# Patient Record
Sex: Male | Born: 1950 | Race: White | Hispanic: No | Marital: Married | Smoking: Former smoker
Health system: Southern US, Community
[De-identification: ages and names within clinical notes are randomized; demographics above are authoritative.]

## PROBLEM LIST (undated history)

## (undated) DIAGNOSIS — I4891 Unspecified atrial fibrillation: Secondary | ICD-10-CM

## (undated) DIAGNOSIS — K900 Celiac disease: Secondary | ICD-10-CM

## (undated) DIAGNOSIS — F419 Anxiety disorder, unspecified: Secondary | ICD-10-CM

## (undated) DIAGNOSIS — H269 Unspecified cataract: Secondary | ICD-10-CM

## (undated) DIAGNOSIS — T7840XA Allergy, unspecified, initial encounter: Secondary | ICD-10-CM

## (undated) DIAGNOSIS — H3320 Serous retinal detachment, unspecified eye: Secondary | ICD-10-CM

## (undated) DIAGNOSIS — I639 Cerebral infarction, unspecified: Secondary | ICD-10-CM

## (undated) DIAGNOSIS — M1990 Unspecified osteoarthritis, unspecified site: Secondary | ICD-10-CM

## (undated) DIAGNOSIS — J449 Chronic obstructive pulmonary disease, unspecified: Secondary | ICD-10-CM

## (undated) HISTORY — DX: Unspecified osteoarthritis, unspecified site: M19.90

## (undated) HISTORY — PX: EYE SURGERY: SHX253

## (undated) HISTORY — DX: Allergy, unspecified, initial encounter: T78.40XA

## (undated) HISTORY — DX: Unspecified cataract: H26.9

## (undated) HISTORY — DX: Chronic obstructive pulmonary disease, unspecified: J44.9

## (undated) HISTORY — DX: Anxiety disorder, unspecified: F41.9

## (undated) HISTORY — PX: FRACTURE SURGERY: SHX138

## (undated) HISTORY — PX: BRAIN SURGERY: SHX531

## (undated) HISTORY — PX: CATARACT EXTRACTION: SUR2

## (undated) HISTORY — DX: Serous retinal detachment, unspecified eye: H33.20

## (undated) HISTORY — PX: RETINAL DETACHMENT SURGERY: SHX105

## (undated) HISTORY — DX: Unspecified atrial fibrillation: I48.91

## (undated) HISTORY — PX: HERNIA REPAIR: SHX51

## (undated) HISTORY — DX: Cerebral infarction, unspecified: I63.9

---

## 2012-05-22 DIAGNOSIS — L0291 Cutaneous abscess, unspecified: Secondary | ICD-10-CM | POA: Insufficient documentation

## 2018-01-30 ENCOUNTER — Other Ambulatory Visit: Payer: Self-pay

## 2018-01-30 ENCOUNTER — Ambulatory Visit (HOSPITAL_COMMUNITY)
Admission: EM | Admit: 2018-01-30 | Discharge: 2018-01-30 | Disposition: A | Discharge status: Home / Self Care | Payer: Medicare Other | Attending: Family Medicine | Admitting: Family Medicine

## 2018-01-30 ENCOUNTER — Encounter (HOSPITAL_COMMUNITY): Payer: Self-pay | Admitting: Emergency Medicine

## 2018-01-30 DIAGNOSIS — K1120 Sialoadenitis, unspecified: Secondary | ICD-10-CM

## 2018-01-30 HISTORY — DX: Celiac disease: K90.0

## 2018-01-30 MED ORDER — AMOXICILLIN-POT CLAVULANATE 875-125 MG PO TABS: 1.0000 | ORAL_TABLET | Freq: Two times a day (BID) | ORAL | 0 refills | Status: AC

## 2018-01-30 NOTE — ED Provider Notes (Signed)
[place]    CSN: [id number] Arrival date & time: 01/30/18  1302     History   Chief Complaint Chief Complaint  Patient presents with  . Lymphadenopathy    left jaw/ear    HPI Douglas Cox is a 67 y.o. male.   Douglas Cox presents with complaints of left facial/jaw/ear tenderness and swelling which he first noticed three days ago and has been worsening. Pain with chewing or opening the jaw. No inner ear pain. No known fevers. No cough, congestion, runny nose. No dental pain. Took ibuprofen which seemed to help some. Moved here recently and has not yet established with a PCP. Hx of celiac, otherwise denies any other medical history.   ROS per HPI.      Past Medical History:  Diagnosis Date  . Celiac disease     There are no active problems to display for this patient.   Past Surgical History:  Procedure Laterality Date  . FRACTURE SURGERY    . HERNIA REPAIR         Home Medications    Prior to Admission medications   Medication Sig Start Date End Date Taking? Authorizing Provider  amoxicillin-clavulanate (AUGMENTIN) 875-125 MG tablet Take 1 tablet by mouth every 12 (twelve) hours for 10 days. 01/30/18 02/09/18  Zigmund Gottron, NP    Family History History reviewed. No pertinent family history.  Social History Social History   Tobacco Use  . Smoking status: Former Smoker    Last attempt to quit: 01/30/2006    Years since quitting: 12.0  . Smokeless tobacco: Never Used  Substance Use Topics  . Alcohol use: Yes  . Drug use: Yes    Types: Marijuana     Allergies   Codone [hydrocodone] and Clarithromycin   Review of Systems Review of Systems   Physical Exam Triage Vital Signs ED Triage Vitals  Enc Vitals Group     BP 01/30/18 1324 (!) 156/88     Pulse Rate 01/30/18 1324 (!) 52     Resp --      Temp 01/30/18 1324 98.3 F (36.8 C)     Temp Source 01/30/18 1324 Oral     SpO2 01/30/18 1324 99 %     Weight --      Height --    Head Circumference --      Peak Flow --      Pain Score 01/30/18 1319 4     Pain Loc --      Pain Edu? --      Excl. in [place]? --    No data found.  Updated Vital Signs BP (!) 156/88 (BP Location: Right Arm)   Pulse (!) 52   Temp 98.3 F (36.8 C) (Oral)   SpO2 99%    Physical Exam  Constitutional: He is oriented to person, place, and time. He appears well-developed and well-nourished.  HENT:  Head: Normocephalic and atraumatic.    Right Ear: Tympanic membrane, external ear and ear canal normal.  Left Ear: Tympanic membrane, external ear and ear canal normal.  Nose: Nose normal. Right sinus exhibits no maxillary sinus tenderness and no frontal sinus tenderness. Left sinus exhibits no maxillary sinus tenderness and no frontal sinus tenderness.  Mouth/Throat: Uvula is midline, oropharynx is clear and moist and mucous membranes are normal. Tonsils are 0 on the right. Tonsils are 0 on the left. No tonsillar exudate.  No oral lesions or swelling; left facial swelling and tenderness anterior to ear  Eyes: Pupils are equal, round, and reactive to light. Conjunctivae are normal.  Neck: Normal range of motion.  Cardiovascular: Normal rate and regular rhythm.  Pulmonary/Chest: Effort normal and breath sounds normal.  Lymphadenopathy:    He has no cervical adenopathy.  Neurological: He is alert and oriented to person, place, and time.  Skin: Skin is warm and dry.  Vitals reviewed.    UC Treatments / Results  Labs (all labs ordered are listed, but only abnormal results are displayed) Labs Reviewed - No data to display  EKG None  Radiology No results found.  Procedures Procedures (including critical care time)  Medications Ordered in UC Medications - No data to display  Initial Impression / Assessment and Plan / UC Course  I have reviewed the triage vital signs and the nursing notes.  Pertinent labs & imaging results that were available during my care of the patient were  reviewed by me and considered in my medical decision making (see chart for details).     Worsening of left facial pain and swelling. Appears consistent with parotitis. Supportive cares recommended, increase fluid intake, suck on sour candies, massage, warm pack. Course of augmentin provided. Return precautions provided. Patient verbalized understanding and agreeable to plan.    Final Clinical Impressions(s) / UC Diagnoses   Final diagnoses:  Parotitis     Discharge Instructions     Warm compresses, massage to the area, increased fluid intake, sucking on sour candy can help with resolution of symptoms.  Complete course of antibiotics.   If symptoms worsen or do not improve in the next week to return to be seen or to follow up with your PCP.      ED Prescriptions    Medication Sig Dispense Auth. Provider   amoxicillin-clavulanate (AUGMENTIN) 875-125 MG tablet Take 1 tablet by mouth every 12 (twelve) hours for 10 days. 20 tablet Zigmund Gottron, NP     Controlled Substance Prescriptions [place] Controlled Substance Registry consulted? Not Applicable   Zigmund Gottron, NP 01/30/18 1342

## 2018-01-30 NOTE — Discharge Instructions (Signed)
Warm compresses, massage to the area, increased fluid intake, sucking on sour candy can help with resolution of symptoms.  Complete course of antibiotics.   If symptoms worsen or do not improve in the next week to return to be seen or to follow up with your PCP.

## 2018-01-30 NOTE — ED Triage Notes (Signed)
Pt reports swelling below his left lower jaw and ear.  He first noticed it on Friday.

## 2018-08-01 ENCOUNTER — Ambulatory Visit: Payer: Medicare Other | Admitting: Endocrinology

## 2018-08-09 ENCOUNTER — Ambulatory Visit: Payer: Medicare Other | Admitting: Endocrinology

## 2018-08-09 ENCOUNTER — Encounter: Payer: Self-pay | Admitting: Endocrinology

## 2018-08-09 VITALS — BP 122/72 | HR 80 | Ht 71.0 in | Wt 170.4 lb

## 2018-08-09 DIAGNOSIS — E039 Hypothyroidism, unspecified: Secondary | ICD-10-CM | POA: Diagnosis not present

## 2018-08-09 DIAGNOSIS — R6882 Decreased libido: Secondary | ICD-10-CM

## 2018-08-09 DIAGNOSIS — K900 Celiac disease: Secondary | ICD-10-CM | POA: Diagnosis not present

## 2018-08-09 DIAGNOSIS — R5383 Other fatigue: Secondary | ICD-10-CM

## 2018-08-09 NOTE — Progress Notes (Addendum)
Patient ID: Douglas Cox, adult   DOB: 1951/01/09, 68 y.o.   MRN: [id number]           Referring Provider: Sharyne Peach  Reason for Appointment: Endocrinology evaluation    History of Present Illness:   Chief complaint: Fatigue   Hypothyroidism was first diagnosed in 7/19 when he was being evaluated as a new patient for fatigue He says that he has been feeling tired and listless for the last few years  He does complain of some cold intolerance also but no weight gain.  He has had some previous history of brain fog Also has had some mood swings He has not had any previous history of thyroid disease or swelling in the thyroid         TSH in 01/2018 was 4.7 with normal total T4 No other thyroid results are available  The patient had been treated with  levothyroxine 50 mcg daily but he took this only for 2 days as he did not feel good with it, does not remember specific symptoms  Currently he is being referred here for further management         Patient's weight history is as follows:  Wt Readings from Last 3 Encounters:  08/09/18 170 lb 6.4 oz (77.3 kg)    Thyroid function results have been as follows:  No results found for: TSH, FREET4, T3FREE   Past Medical History:  Diagnosis Date  . Celiac disease     Past Surgical History:  Procedure Laterality Date  . FRACTURE SURGERY    . HERNIA REPAIR      History reviewed. No pertinent family history.  Social History:  reports that she quit smoking about 12 years ago. She has never used smokeless tobacco. She reports current alcohol use of about 4.0 - 5.0 standard drinks of alcohol per week. She reports current drug use. Frequency: 7.00 times per week. Drug: Marijuana.  Allergies:  Allergies  Allergen Reactions  . Codone [Hydrocodone] Hives  . Clarithromycin Anxiety    Allergies as of 08/09/2018      Reactions   Codone [hydrocodone] Hives   Clarithromycin Anxiety      Medication List    as of August 09, 2018 11:18 AM   You have not been prescribed any medications.          Review of Systems  Constitutional: Negative for weight loss.  HENT:       No difficulty swallowing  Respiratory: Negative for shortness of breath.   Cardiovascular: Negative for chest pain and leg swelling.  Gastrointestinal: Positive for diarrhea.  Musculoskeletal: Negative for myalgias.       Previously had joint pains with uncontrolled celiac disease  Skin: Negative for rash.  Neurological: Negative for dizziness and headaches.  Endo/Heme/Allergies:       Last A1c was 5.6 with a fasting glucose of 100.  Psychiatric/Behavioral: Positive for depression. The patient has insomnia.    He has been diagnosed to have celiac disease He has usually been trying to stay away from gluten and recently also avoiding milk products with some improvement in his chronic diarrhea which is still not normal and usually has some loose stools  He was also reportedly on B12 shots from previous physician and has not taken this since 5/19 as he ran out of prescription However he does not think he feels any worse without the injections He has the B12 available but no syringes  Examination:    BP 122/72 (BP Location: Left Arm, Patient Position: Sitting, Cuff Size: Normal)   Pulse 80   Ht 5' 11"  (1.803 m)   Wt 170 lb 6.4 oz (77.3 kg)   SpO2 97%   BMI 23.77 kg/m   GENERAL:  Average build.   No pallor.    Skin:  no rash or skin lesions, no pigmentation.  EYES:  No prominence of the eyes or swelling of the eyelids  ENT: Oral mucosa and tongue normal.  No pigmentation  NECK: No lymphadenopathy  THYROID:  Not palpable.  HEART:  Normal  S1 and S2; no murmur or click.  CHEST:    Lungs: Vescicular breath sounds heard equally.  No crepitations/ wheeze.  ABDOMEN:  No distention.  Liver and spleen not palpable.  No other mass or tenderness.  NEUROLOGICAL: Reflexes are bilaterally difficult to elicit at biceps and  ankles.  EXTREMITIES:  Normal peripheral joints.  No ankle edema present   Assessment:  FATIGUE of unclear etiology Unlikely that he has symptomatic hypothyroidism with TSH of 4.7 He also has symptoms of depression, insomnia and since his symptoms are longstanding likely has untreated depression at this time However because of his low motivation and decreased libido will need to rule out hypogonadism also  Celiac disease and reportedly having lactose intolerance causing GI symptoms  History of vitamin B12 deficiency: Previous records not available and not clear how well this is documented Currently this is untreated but has no history of anemia recently  Asymptomatic low calcium of 8.5 in 2019   PLAN:   Recheck thyroid levels Check fasting free and total testosterone Recheck B12 level Check vitamin D level  Follow-up to be determined based on whether he has any treatable endocrine problems on labs   Elayne Snare 08/09/2018, 11:18 AM   Consultation note copy sent to the PCP  Note: This office note was prepared with Dragon voice recognition system technology. Any transcriptional errors that result from this process are unintentional.  ADDENDUM:  His labs including thyroid, testosterone are normal, B12 is low and he will start B12 as prescribed by PCP To continue follow-up with PCP  Lab Results  Component Value Date   VITAMINB12 178 (L) 08/11/2018   Lab Results  Component Value Date   TSH 3.37 08/11/2018   Elayne Snare

## 2018-08-11 ENCOUNTER — Other Ambulatory Visit (INDEPENDENT_AMBULATORY_CARE_PROVIDER_SITE_OTHER): Payer: Medicare Other

## 2018-08-11 DIAGNOSIS — E039 Hypothyroidism, unspecified: Secondary | ICD-10-CM | POA: Diagnosis not present

## 2018-08-11 DIAGNOSIS — R6882 Decreased libido: Secondary | ICD-10-CM

## 2018-08-11 DIAGNOSIS — K900 Celiac disease: Secondary | ICD-10-CM

## 2018-08-11 LAB — BASIC METABOLIC PANEL
BUN: 13 mg/dL (ref 6–23)
CHLORIDE: 107 meq/L (ref 96–112)
CO2: 26 mEq/L (ref 19–32)
CREATININE: 0.93 mg/dL (ref 0.40–1.50)
Calcium: 8.8 mg/dL (ref 8.4–10.5)
GFR: 85.8 mL/min (ref >60.00)
Glucose, Bld: 98 mg/dL (ref 70–99)
POTASSIUM: 4.4 meq/L (ref 3.5–5.1)
Sodium: 142 mEq/L (ref 135–145)

## 2018-08-11 LAB — T4, FREE: Free T4: 0.87 ng/dL (ref 0.60–1.60)

## 2018-08-11 LAB — TSH: TSH: 3.37 u[IU]/mL (ref 0.35–4.50)

## 2018-08-11 LAB — VITAMIN D 25 HYDROXY (VIT D DEFICIENCY, FRACTURES): VITD: 42.01 ng/mL (ref 30.00–100.00)

## 2018-08-11 LAB — VITAMIN B12: VITAMIN B 12: 178 pg/mL — AB (ref 211–911)

## 2018-08-12 NOTE — Progress Notes (Signed)
Please call to let patient know that the lab results are normal except low B12.  Since thyroid and testosterone are normal he needs to follow-up with PCP regarding his fatigue and start B12 as per PCP

## 2018-08-13 LAB — TESTOSTERONE, FREE, TOTAL, SHBG
Sex Hormone Binding: 45.6 nmol/L (ref 19.3–76.4)
Testosterone, Free: 11.4 pg/mL (ref 6.6–18.1)
Testosterone: 529 ng/dL (ref 264–916)

## 2019-02-20 ENCOUNTER — Other Ambulatory Visit: Payer: Self-pay | Admitting: Internal Medicine

## 2019-02-20 ENCOUNTER — Other Ambulatory Visit: Payer: Self-pay

## 2019-02-20 ENCOUNTER — Ambulatory Visit
Admission: RE | Admit: 2019-02-20 | Discharge: 2019-02-20 | Disposition: A | Discharge status: Home / Self Care | Payer: Medicare Other | Source: Ambulatory Visit | Attending: Internal Medicine | Admitting: Internal Medicine

## 2019-02-20 DIAGNOSIS — R634 Abnormal weight loss: Secondary | ICD-10-CM

## 2019-02-23 ENCOUNTER — Ambulatory Visit
Admission: RE | Admit: 2019-02-23 | Discharge: 2019-02-23 | Disposition: A | Discharge status: Home / Self Care | Payer: Medicare Other | Source: Ambulatory Visit | Attending: Internal Medicine | Admitting: Internal Medicine

## 2019-02-23 DIAGNOSIS — R634 Abnormal weight loss: Secondary | ICD-10-CM

## 2019-02-28 ENCOUNTER — Other Ambulatory Visit: Payer: Self-pay | Admitting: Internal Medicine

## 2019-02-28 DIAGNOSIS — R160 Hepatomegaly, not elsewhere classified: Secondary | ICD-10-CM

## 2019-03-15 ENCOUNTER — Other Ambulatory Visit: Payer: Self-pay | Admitting: Internal Medicine

## 2019-03-19 ENCOUNTER — Other Ambulatory Visit: Payer: Self-pay | Admitting: Internal Medicine

## 2019-03-27 ENCOUNTER — Ambulatory Visit
Admission: RE | Admit: 2019-03-27 | Discharge: 2019-03-27 | Disposition: A | Discharge status: Home / Self Care | Payer: Medicare Other | Source: Ambulatory Visit | Attending: Internal Medicine | Admitting: Internal Medicine

## 2019-03-27 ENCOUNTER — Other Ambulatory Visit: Payer: Self-pay

## 2019-03-27 DIAGNOSIS — R160 Hepatomegaly, not elsewhere classified: Secondary | ICD-10-CM

## 2019-03-27 MED ORDER — GADOBENATE DIMEGLUMINE 529 MG/ML IV SOLN
14.0000 mL | Freq: Once | INTRAVENOUS | Status: AC | PRN
  Administered 2019-03-27: 14 mL via INTRAVENOUS

## 2019-05-28 DIAGNOSIS — R499 Unspecified voice and resonance disorder: Secondary | ICD-10-CM | POA: Insufficient documentation

## 2019-05-28 DIAGNOSIS — E538 Deficiency of other specified B group vitamins: Secondary | ICD-10-CM | POA: Insufficient documentation

## 2019-05-28 DIAGNOSIS — D1803 Hemangioma of intra-abdominal structures: Secondary | ICD-10-CM | POA: Insufficient documentation

## 2019-05-28 DIAGNOSIS — F330 Major depressive disorder, recurrent, mild: Secondary | ICD-10-CM | POA: Insufficient documentation

## 2019-05-28 DIAGNOSIS — F1210 Cannabis abuse, uncomplicated: Secondary | ICD-10-CM | POA: Insufficient documentation

## 2019-05-28 DIAGNOSIS — J449 Chronic obstructive pulmonary disease, unspecified: Secondary | ICD-10-CM | POA: Insufficient documentation

## 2019-05-28 DIAGNOSIS — I700 Atherosclerosis of aorta: Secondary | ICD-10-CM | POA: Insufficient documentation

## 2019-05-28 DIAGNOSIS — F1021 Alcohol dependence, in remission: Secondary | ICD-10-CM | POA: Insufficient documentation

## 2020-02-14 DIAGNOSIS — D8989 Other specified disorders involving the immune mechanism, not elsewhere classified: Secondary | ICD-10-CM | POA: Insufficient documentation

## 2020-02-14 DIAGNOSIS — L989 Disorder of the skin and subcutaneous tissue, unspecified: Secondary | ICD-10-CM | POA: Insufficient documentation

## 2020-02-14 DIAGNOSIS — E559 Vitamin D deficiency, unspecified: Secondary | ICD-10-CM | POA: Insufficient documentation

## 2020-05-19 NOTE — Progress Notes (Signed)
[facility] Note  05/20/2020     CHIEF COMPLAINT Patient presents for Retina Evaluation   HISTORY OF PRESENT ILLNESS: Douglas Cox is a 69 y.o. adult who presents to the clinic today for:   HPI    Retina Evaluation    In left eye.  This started 1 week ago.  Duration of 1 week.  Associated Symptoms Floaters.  Negative for Distortion, Pain, Photophobia, Trauma, Jaw Claudication, Fever, Fatigue, Blind Spot, Redness, Glare, Scalp Tenderness, Shoulder/Hip pain, Weight Loss and Flashes.  Context:  distance vision, mid-range vision and near vision.  Treatments tried include no treatments.  I, the attending physician,  performed the HPI with the patient and updated documentation appropriately.          Comments    Patient c/o floaters OS that "look like fast moving gnats." Symptoms started on 10.12.2021. On 10.15.2021, patient started to see "veil" in vision OS. If patient moves head, veil moves in the opposite direction. No flashes. History of myopia, prior to CE with IOL OU (January and February 2021).       Last edited by Bernarda Caffey, MD on 05/20/2020  2:07 PM. (History)    pt is here on the referral of Dr. Katy Fitch who saw the pt over the weekend while on call, pt states Dr. Katy Fitch said he does not have a retinal detachment, but mentioned a Mariel Kansky ring, pt states he has had floaters in his left eye "forever", but last Tuesday they turned into a "bunch of gnats", pt states his vision is distorted in his left eye as well, pt had cataract sx with Dr. Valetta Close  Referring physician: Lorene Dy, MD [address] [place],  [place] [address]  HISTORICAL INFORMATION:   Selected notes from the [facility] for eval of increased floaters/veil OS   CURRENT MEDICATIONS: No current outpatient medications on file. (Ophthalmic Drugs)   No current facility-administered medications for this visit. (Ophthalmic Drugs)   Current Outpatient  Medications (Other)  Medication Sig  . cholecalciferol (VITAMIN D3) 25 MCG (1000 UNIT) tablet Take 1,000 Units by mouth daily.   No current facility-administered medications for this visit. (Other)      REVIEW OF SYSTEMS: ROS    Positive for: Eyes   Negative for: Constitutional, Gastrointestinal, Neurological, Skin, Genitourinary, Musculoskeletal, HENT, Endocrine, Cardiovascular, Respiratory, Psychiatric, Allergic/Imm, Heme/Lymph   Last edited by Roselee Nova D, COT on 05/20/2020  1:15 PM. (History)       ALLERGIES Allergies  Allergen Reactions  . Codone [Hydrocodone] Hives  . Clarithromycin Anxiety    PAST MEDICAL HISTORY Past Medical History:  Diagnosis Date  . Celiac disease    Past Surgical History:  Procedure Laterality Date  . CATARACT EXTRACTION Bilateral    Dr. Valetta Close  . FRACTURE SURGERY    . HERNIA REPAIR      FAMILY HISTORY History reviewed. No pertinent family history.  SOCIAL HISTORY Social History   Tobacco Use  . Smoking status: Former Smoker    Quit date: 01/30/2006    Years since quitting: 14.3  . Smokeless tobacco: Never Used  Vaping Use  . Vaping Use: Never used  Substance Use Topics  . Alcohol use: Yes    Alcohol/week: 4.0 - 5.0 standard drinks    Types: 4 - 5 Shots of liquor per week    Comment: 3 glasses of whiskey per night.  . Drug use: Yes    Frequency: 7.0 times per week  Types: Marijuana         OPHTHALMIC EXAM:  Base Eye Exam    Visual Acuity (Snellen - Linear)      Right Left   Dist cc 20/20 -1 20/20       Tonometry (Tonopen, 1:27 PM)      Right Left   Pressure 15 14       Pupils      Dark Light Shape React APD   Right 3 2 Round Brisk None   Left 3 2 Round Brisk None       Visual Fields (Counting fingers)      Left Right    Full Full       Extraocular Movement      Right Left    Full, Ortho Full, Ortho       Neuro/Psych    Oriented x3: Yes   Mood/Affect: Normal       Dilation    Both eyes:  1.0% Mydriacyl, 2.5% Phenylephrine @ 1:27 PM        Slit Lamp and Fundus Exam    Slit Lamp Exam      Right Left   Lids/Lashes Dermatochalasis - upper lid Dermatochalasis - upper lid, mild Meibomian gland dysfunction   Conjunctiva/Sclera White and quiet White and quiet   Cornea arcus, well healed temporal cataract wounds arcus, well healed temporal cataract wounds   Anterior Chamber Deep and quiet Deep and quiet   Iris Round and dilated Round and dilated   Lens PC IOL in good position, trace Posterior capsular opacification PC IOL in good position, trace Posterior capsular opacification   Vitreous Vitreous syneresis, vitreous condensations Vitreous syneresis, no pigment, Posterior vitreous detachment, Weiss ring       Fundus Exam      Right Left   Disc Pink and Sharp Pink and Sharp, Compact, mild tilt   C/D Ratio 0.1 0.2   Macula Flat, Blunted foveal reflex, mild RPE mottling, No heme or edema Flat, Blunted foveal reflex, mild RPE mottling, No heme or edema   Vessels attenuated, mild tortuousity attenuated, mild tortuousity, mild AV crossing changes   Periphery Attached, mild pigmented paving stone degeneration IT quad, No heme  Attached, mild pigmented paving stone degeneration IT quad, No heme         Refraction    Wearing Rx      Sphere Cylinder Axis Add   Right +0.25 Sphere  +2.50   Left -0.50 +0.75 117 +2.50       Manifest Refraction      Sphere Cylinder Axis Dist VA   Right +0.25 Sphere  20/20-1   Left -1.00 +0.75 117 20/20          IMAGING AND PROCEDURES  Imaging and Procedures for 05/20/2020  OCT, Retina - OU - Both Eyes       Right Eye Quality was good. Central Foveal Thickness: 279. Progression has no prior data. Findings include normal foveal contour, no IRF, no SRF, vitreomacular adhesion .   Left Eye Quality was good. Central Foveal Thickness: 270. Progression has no prior data. Findings include normal foveal contour, no IRF, no SRF (Mild vitreous  opacities).   Notes *Images captured and stored on drive  Diagnosis / Impression:  NFP, no IRF/SRF OU  Clinical management:  See below  Abbreviations: NFP - Normal foveal profile. CME - cystoid macular edema. PED - pigment epithelial detachment. IRF - intraretinal fluid. SRF - subretinal fluid. EZ - ellipsoid zone. ERM -  epiretinal membrane. ORA - outer retinal atrophy. ORT - outer retinal tubulation. SRHM - subretinal hyper-reflective material. IRHM - intraretinal hyper-reflective material                 ASSESSMENT/PLAN:    ICD-10-CM   1. Posterior vitreous detachment of left eye  H43.812   2. Retinal edema  H35.81 OCT, Retina - OU - Both Eyes  3. Pseudophakia, both eyes  Z96.1     1,2. Acute PVD OS  - acute development of prominent floaters OS -- saw Dr. Katy Fitch on call on Saturday, 10.16.21  - Discussed findings and prognosis  - No RT or RD on 360 scleral depressed exam  - Reviewed s/s of RT/RD  - Strict return precautions for any such RT/RD signs/symptoms  - f/u in 3-4 wks -- DFE/OCT  3. Pseudophakia OU  - s/p CE/IOL OU (Dr. Valetta Close)  - IOLs in good position, doing well  - monitor   Ophthalmic Meds Ordered this visit:  No orders of the defined types were placed in this encounter.      Return for f/u 3-4 weeks, PVD OS, DFE, OCT.  There are no Patient Instructions on file for this visit.   Explained the diagnoses, plan, and follow up with the patient and they expressed understanding.  Patient expressed understanding of the importance of proper follow up care.   This document serves as a record of services personally performed by Gardiner Sleeper, MD, PhD. It was created on their behalf by Estill Bakes, COT an ophthalmic technician. The creation of this record is the provider's dictation and/or activities during the visit.    Electronically signed by: Estill Bakes, [place] 10.18.21 @ 5:12 PM  Gardiner Sleeper, M.D., Ph.D. Diseases & Surgery of the Retina and  Vitreous [facility]  I have reviewed the above documentation for accuracy and completeness, and I agree with the above. Gardiner Sleeper, M.D., Ph.D. 05/20/20 5:12 PM   Abbreviations: M myopia (nearsighted); A astigmatism; H hyperopia (farsighted); P presbyopia; Mrx spectacle prescription;  CTL contact lenses; OD right eye; OS left eye; OU both eyes  XT exotropia; ET esotropia; PEK punctate epithelial keratitis; PEE punctate epithelial erosions; DES dry eye syndrome; MGD meibomian gland dysfunction; ATs artificial tears; PFAT's preservative free artificial tears; [place] nuclear sclerotic cataract; PSC posterior subcapsular cataract; ERM epi-retinal membrane; PVD posterior vitreous detachment; RD retinal detachment; DM diabetes mellitus; DR diabetic retinopathy; NPDR non-proliferative diabetic retinopathy; PDR proliferative diabetic retinopathy; CSME clinically significant macular edema; DME diabetic macular edema; dbh dot blot hemorrhages; CWS cotton wool spot; POAG primary open angle glaucoma; C/D cup-to-disc ratio; HVF humphrey visual field; GVF goldmann visual field; OCT optical coherence tomography; IOP intraocular pressure; BRVO Branch retinal vein occlusion; CRVO central retinal vein occlusion; CRAO central retinal artery occlusion; BRAO branch retinal artery occlusion; RT retinal tear; SB scleral buckle; PPV pars plana vitrectomy; VH Vitreous hemorrhage; PRP panretinal laser photocoagulation; IVK intravitreal kenalog; VMT vitreomacular traction; MH Macular hole;  NVD neovascularization of the disc; NVE neovascularization elsewhere; AREDS age related eye disease study; ARMD age related macular degeneration; POAG primary open angle glaucoma; EBMD epithelial/anterior basement membrane dystrophy; ACIOL anterior chamber intraocular lens; IOL intraocular lens; PCIOL posterior chamber intraocular lens; Phaco/IOL phacoemulsification with intraocular lens placement; [place] photorefractive  keratectomy; LASIK laser assisted in situ keratomileusis; HTN hypertension; DM diabetes mellitus; COPD chronic obstructive pulmonary disease

## 2020-05-20 ENCOUNTER — Other Ambulatory Visit: Payer: Self-pay

## 2020-05-20 ENCOUNTER — Ambulatory Visit (INDEPENDENT_AMBULATORY_CARE_PROVIDER_SITE_OTHER): Payer: Medicare Other | Admitting: Ophthalmology

## 2020-05-20 ENCOUNTER — Encounter (INDEPENDENT_AMBULATORY_CARE_PROVIDER_SITE_OTHER): Payer: Self-pay | Admitting: Ophthalmology

## 2020-05-20 DIAGNOSIS — H3581 Retinal edema: Secondary | ICD-10-CM | POA: Diagnosis not present

## 2020-05-20 DIAGNOSIS — H43812 Vitreous degeneration, left eye: Secondary | ICD-10-CM | POA: Diagnosis not present

## 2020-05-20 DIAGNOSIS — Z961 Presence of intraocular lens: Secondary | ICD-10-CM | POA: Diagnosis not present

## 2020-05-20 DIAGNOSIS — E785 Hyperlipidemia, unspecified: Secondary | ICD-10-CM | POA: Insufficient documentation

## 2020-06-17 ENCOUNTER — Encounter (INDEPENDENT_AMBULATORY_CARE_PROVIDER_SITE_OTHER): Payer: Medicare Other | Admitting: Ophthalmology

## 2021-02-19 DIAGNOSIS — M25552 Pain in left hip: Secondary | ICD-10-CM | POA: Insufficient documentation

## 2021-03-25 ENCOUNTER — Observation Stay (HOSPITAL_BASED_OUTPATIENT_CLINIC_OR_DEPARTMENT_OTHER)
Admission: EM | Admit: 2021-03-25 | Discharge: 2021-03-26 | Disposition: A | Discharge status: Home / Self Care | Payer: Medicare Other | Attending: Internal Medicine | Admitting: Internal Medicine

## 2021-03-25 ENCOUNTER — Emergency Department (HOSPITAL_BASED_OUTPATIENT_CLINIC_OR_DEPARTMENT_OTHER): Payer: Medicare Other

## 2021-03-25 ENCOUNTER — Encounter (HOSPITAL_BASED_OUTPATIENT_CLINIC_OR_DEPARTMENT_OTHER): Payer: Self-pay | Admitting: Emergency Medicine

## 2021-03-25 ENCOUNTER — Other Ambulatory Visit: Payer: Self-pay

## 2021-03-25 DIAGNOSIS — K900 Celiac disease: Secondary | ICD-10-CM | POA: Diagnosis present

## 2021-03-25 DIAGNOSIS — F1020 Alcohol dependence, uncomplicated: Secondary | ICD-10-CM | POA: Diagnosis present

## 2021-03-25 DIAGNOSIS — M546 Pain in thoracic spine: Secondary | ICD-10-CM

## 2021-03-25 DIAGNOSIS — R001 Bradycardia, unspecified: Secondary | ICD-10-CM | POA: Diagnosis present

## 2021-03-25 DIAGNOSIS — J9859 Other diseases of mediastinum, not elsewhere classified: Secondary | ICD-10-CM | POA: Diagnosis not present

## 2021-03-25 DIAGNOSIS — Z20822 Contact with and (suspected) exposure to covid-19: Secondary | ICD-10-CM | POA: Diagnosis not present

## 2021-03-25 DIAGNOSIS — R0789 Other chest pain: Secondary | ICD-10-CM | POA: Diagnosis present

## 2021-03-25 DIAGNOSIS — Z87891 Personal history of nicotine dependence: Secondary | ICD-10-CM | POA: Diagnosis not present

## 2021-03-25 DIAGNOSIS — R109 Unspecified abdominal pain: Secondary | ICD-10-CM

## 2021-03-25 LAB — HEPATIC FUNCTION PANEL
ALT: 15 U/L (ref 0–44)
AST: 15 U/L (ref 15–41)
Albumin: 3.6 g/dL (ref 3.5–5.0)
Alkaline Phosphatase: 39 U/L (ref 38–126)
Bilirubin, Direct: 0.2 mg/dL (ref 0.0–0.2)
Indirect Bilirubin: 0.7 mg/dL (ref 0.3–0.9)
Total Bilirubin: 0.9 mg/dL (ref 0.3–1.2)
Total Protein: 6.8 g/dL (ref 6.5–8.1)

## 2021-03-25 LAB — CBC
HCT: 44.4 % (ref 39.0–52.0)
Hemoglobin: 14.6 g/dL (ref 13.0–17.0)
MCH: 33.1 pg (ref 26.0–34.0)
MCHC: 32.9 g/dL (ref 30.0–36.0)
MCV: 100.7 fL — ABNORMAL HIGH (ref 80.0–100.0)
Platelets: 290 10*3/uL (ref 150–400)
RBC: 4.41 MIL/uL (ref 4.22–5.81)
RDW: 13.1 % (ref 11.5–15.5)
WBC: 3.9 10*3/uL — ABNORMAL LOW (ref 4.0–10.5)
nRBC: 0 % (ref 0.0–0.2)

## 2021-03-25 LAB — BASIC METABOLIC PANEL
Anion gap: 9 (ref 5–15)
BUN: 15 mg/dL (ref 8–23)
CO2: 26 mmol/L (ref 22–32)
Calcium: 8.6 mg/dL — ABNORMAL LOW (ref 8.9–10.3)
Chloride: 105 mmol/L (ref 98–111)
Creatinine, Ser: 0.97 mg/dL (ref 0.61–1.24)
GFR, Estimated: >60 mL/min (ref >60)
Glucose, Bld: 99 mg/dL (ref 70–99)
Potassium: 4.3 mmol/L (ref 3.5–5.1)
Sodium: 140 mmol/L (ref 135–145)

## 2021-03-25 LAB — RESP PANEL BY RT-PCR (FLU A&B, COVID) ARPGX2
Influenza A by PCR: NEGATIVE
Influenza B by PCR: NEGATIVE
SARS Coronavirus 2 by RT PCR: NEGATIVE

## 2021-03-25 LAB — TSH: TSH: 8.084 u[IU]/mL — ABNORMAL HIGH (ref 0.350–4.500)

## 2021-03-25 LAB — TROPONIN I (HIGH SENSITIVITY)
Troponin I (High Sensitivity): 5 ng/L (ref <18)
Troponin I (High Sensitivity): 5 ng/L (ref <18)

## 2021-03-25 LAB — LIPASE, BLOOD: Lipase: 17 U/L (ref 11–51)

## 2021-03-25 LAB — HIV ANTIBODY (ROUTINE TESTING W REFLEX): HIV Screen 4th Generation wRfx: NONREACTIVE

## 2021-03-25 LAB — MAGNESIUM: Magnesium: 1.8 mg/dL (ref 1.7–2.4)

## 2021-03-25 MED ORDER — ADULT MULTIVITAMIN W/MINERALS CH
1.0000 | ORAL_TABLET | Freq: Every day | ORAL | Status: DC
  Administered 2021-03-25 – 2021-03-26 (×2): 1 via ORAL
  Filled 2021-03-25 (×2): qty 1

## 2021-03-25 MED ORDER — ONDANSETRON HCL 4 MG/2ML IJ SOLN: 4.0000 mg | Freq: Four times a day (QID) | INTRAMUSCULAR | Status: DC | PRN

## 2021-03-25 MED ORDER — HYDROMORPHONE HCL 1 MG/ML IJ SOLN: 1.0000 mg | INTRAMUSCULAR | Status: DC | PRN

## 2021-03-25 MED ORDER — LORAZEPAM 1 MG PO TABS: 0.0000 mg | ORAL_TABLET | Freq: Two times a day (BID) | ORAL | Status: DC

## 2021-03-25 MED ORDER — HYDROMORPHONE HCL 1 MG/ML IJ SOLN
1.0000 mg | INTRAMUSCULAR | Status: DC | PRN
Start: 2021-03-25 — End: 2021-03-25
  Administered 2021-03-25 (×2): 1 mg via INTRAVENOUS
  Filled 2021-03-25 (×3): qty 1

## 2021-03-25 MED ORDER — DIAZEPAM 5 MG/ML IJ SOLN: 2.5000 mg | Freq: Once | INTRAMUSCULAR | Status: DC

## 2021-03-25 MED ORDER — ONDANSETRON HCL 4 MG PO TABS: 4.0000 mg | ORAL_TABLET | Freq: Four times a day (QID) | ORAL | Status: DC | PRN

## 2021-03-25 MED ORDER — FOLIC ACID 1 MG PO TABS
1.0000 mg | ORAL_TABLET | Freq: Every day | ORAL | Status: DC
  Administered 2021-03-25 – 2021-03-26 (×2): 1 mg via ORAL
  Filled 2021-03-25 (×2): qty 1

## 2021-03-25 MED ORDER — LACTATED RINGERS IV BOLUS
1000.0000 mL | Freq: Once | INTRAVENOUS | Status: AC
  Administered 2021-03-25: 1000 mL via INTRAVENOUS

## 2021-03-25 MED ORDER — ENOXAPARIN SODIUM 40 MG/0.4ML IJ SOSY
40.0000 mg | PREFILLED_SYRINGE | INTRAMUSCULAR | Status: DC
  Administered 2021-03-25: 40 mg via SUBCUTANEOUS
  Filled 2021-03-25: qty 0.4

## 2021-03-25 MED ORDER — LACTATED RINGERS IV SOLN: INTRAVENOUS | Status: DC

## 2021-03-25 MED ORDER — MORPHINE SULFATE (PF) 4 MG/ML IV SOLN
4.0000 mg | Freq: Once | INTRAVENOUS | Status: AC
  Administered 2021-03-25: 4 mg via INTRAVENOUS
  Filled 2021-03-25: qty 1

## 2021-03-25 MED ORDER — MORPHINE SULFATE (PF) 4 MG/ML IV SOLN
4.0000 mg | INTRAVENOUS | Status: DC | PRN
Start: 2021-03-25 — End: 2021-03-25

## 2021-03-25 MED ORDER — PANTOPRAZOLE SODIUM 40 MG IV SOLR
40.0000 mg | INTRAVENOUS | Status: DC
  Administered 2021-03-25: 40 mg via INTRAVENOUS
  Filled 2021-03-25: qty 40

## 2021-03-25 MED ORDER — THIAMINE HCL 100 MG PO TABS
100.0000 mg | ORAL_TABLET | Freq: Every day | ORAL | Status: DC
  Administered 2021-03-26: 100 mg via ORAL
  Filled 2021-03-25 (×2): qty 1

## 2021-03-25 MED ORDER — RISAQUAD PO CAPS
1.0000 | ORAL_CAPSULE | Freq: Every day | ORAL | Status: DC
  Administered 2021-03-26: 1 via ORAL
  Filled 2021-03-25 (×3): qty 1

## 2021-03-25 MED ORDER — HYDROMORPHONE HCL 1 MG/ML IJ SOLN
1.0000 mg | Freq: Once | INTRAMUSCULAR | Status: AC
Start: 2021-03-25 — End: 2021-03-25
  Administered 2021-03-25: 1 mg via INTRAVENOUS
  Filled 2021-03-25: qty 1

## 2021-03-25 MED ORDER — IOHEXOL 350 MG/ML SOLN
75.0000 mL | Freq: Once | INTRAVENOUS | Status: AC | PRN
  Administered 2021-03-25: 75 mL via INTRAVENOUS

## 2021-03-25 MED ORDER — PSYLLIUM 95 % PO PACK
1.0000 | PACK | Freq: Every day | ORAL | Status: DC
  Administered 2021-03-26: 1 via ORAL
  Filled 2021-03-25 (×3): qty 1

## 2021-03-25 MED ORDER — SODIUM CHLORIDE 0.45 % IV SOLN: INTRAVENOUS | Status: DC

## 2021-03-25 MED ORDER — LORAZEPAM 1 MG PO TABS: 1.0000 mg | ORAL_TABLET | ORAL | Status: DC | PRN

## 2021-03-25 MED ORDER — LORAZEPAM 1 MG PO TABS: 0.0000 mg | ORAL_TABLET | Freq: Four times a day (QID) | ORAL | Status: DC

## 2021-03-25 MED ORDER — THIAMINE HCL 100 MG/ML IJ SOLN
100.0000 mg | Freq: Every day | INTRAMUSCULAR | Status: DC
  Administered 2021-03-25: 100 mg via INTRAVENOUS
  Filled 2021-03-25: qty 2

## 2021-03-25 MED ORDER — PANTOPRAZOLE SODIUM 40 MG IV SOLR
40.0000 mg | Freq: Once | INTRAVENOUS | Status: AC
  Administered 2021-03-25: 40 mg via INTRAVENOUS
  Filled 2021-03-25: qty 40

## 2021-03-25 MED ORDER — FAMOTIDINE IN NACL 20-0.9 MG/50ML-% IV SOLN
20.0000 mg | Freq: Once | INTRAVENOUS | Status: AC
  Administered 2021-03-25: 20 mg via INTRAVENOUS
  Filled 2021-03-25: qty 50

## 2021-03-25 MED ORDER — VITAMIN D 25 MCG (1000 UNIT) PO TABS
1000.0000 [IU] | ORAL_TABLET | Freq: Every day | ORAL | Status: DC
  Administered 2021-03-25 – 2021-03-26 (×2): 1000 [IU] via ORAL
  Filled 2021-03-25 (×2): qty 1

## 2021-03-25 MED ORDER — LORAZEPAM 2 MG/ML IJ SOLN: 1.0000 mg | INTRAMUSCULAR | Status: DC | PRN

## 2021-03-25 NOTE — ED Provider Notes (Signed)
[facility] EMERGENCY DEPT Provider Note   CSN: [id number] Arrival date & time: 03/25/21  8315     History Chief Complaint  Patient presents with   Chest Pain    Douglas Cox is a 70 y.o. adult.  HPI Patient reports he is having severe pain in his left upper abdomen and lower chest.  He reports he thinks it might be food related.  He has significant lactose intolerance.  He reports he ate a meal last night that might of triggered his symptoms.  Reports he had nausea and some discomfort but this morning it became severe with waves of intense spasmodic pain that is also radiating to the left shoulder.  There is nothing he can do to get comfortable.  He felt nauseated but did not vomit.  Denies shortness of breath.  Denies syncope.  No history of similar pain.    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 Valetta Close   FRACTURE SURGERY     HERNIA REPAIR         No family history on file.  Social History   Tobacco Use   Smoking status: Former    Types: Cigarettes    Quit date: 01/30/2006    Years since quitting: 15.1   Smokeless tobacco: Never  Vaping Use   Vaping Use: Never used  Substance Use Topics   Alcohol use: Yes    Alcohol/week: 4.0 - 5.0 standard drinks    Types: 4 - 5 Shots of liquor per week    Comment: 3 glasses of whiskey per night.   Drug use: Yes    Frequency: 7.0 times per week    Types: Marijuana    Home Medications Prior to Admission medications   Medication Sig Start Date End Date Taking? Authorizing Provider  acidophilus (RISAQUAD) CAPS capsule Take 1 capsule by mouth daily. Align once daily   Yes [provider]  cholecalciferol (VITAMIN D3) 25 MCG (1000 UNIT) tablet Take 1,000 Units by mouth daily.   Yes [provider]   Cyanocobalamin 1000 MCG/ML KIT Inject 1 mL as directed. Every 2 weeks   Yes [provider]  psyllium (METAMUCIL) 58.6 % powder Take 1 packet by mouth daily.   Yes [provider]    Allergies    Codone [hydrocodone] and Clarithromycin  Review of Systems   Review of Systems 10 systems reviewed and negative except as per HPI Physical Exam Updated Vital Signs BP (!) 153/61   Pulse (!) 44   Temp 97.7 F (36.5 C)   Resp 13   Ht _0  (1.778 m)   Wt 70.3 kg   SpO2 96%   BMI 22.24 kg/m   Physical Exam Constitutional:      Comments: Alert nontoxic well-nourished well-developed.  Patient appears to be in severe pain.  No respiratory distress  HENT:     Head: Normocephalic and atraumatic.     Mouth/Throat:     Pharynx: Oropharynx is clear.  Cardiovascular:     Rate and Rhythm: Normal rate and regular rhythm.     Comments: Radial pulse 2+ strong Pulmonary:     Effort: Pulmonary effort is normal.     Breath sounds: Normal breath sounds.     Comments: Both lungs clear with  good airflow to the bases. Abdominal:     Comments: Left upper quadrant tender.  No appreciable mass.  Lower abdomen nontender.  No groin masses.  Femoral pulses 2+ and strong.  Musculoskeletal:     Comments: No lower extremity edema or swelling.  Skin:    General: Skin is warm and dry.  Neurological:     General: No focal deficit present.     Mental Status: She is oriented to person, place, and time.     Coordination: Coordination normal.    ED Results / Procedures / Treatments   Labs (all labs ordered are listed, but only abnormal results are displayed) Labs Reviewed  BASIC METABOLIC PANEL - Abnormal; Notable for the following components:      Result Value   Calcium 8.6 (*)    All other components within normal limits  CBC - Abnormal; Notable for the following components:   WBC 3.9 (*)    MCV 100.7 (*)    All other components within normal limits  RESP PANEL BY RT-PCR (FLU  A&B, COVID) ARPGX2  HEPATIC FUNCTION PANEL  LIPASE, BLOOD  TROPONIN I (HIGH SENSITIVITY)  TROPONIN I (HIGH SENSITIVITY)    EKG EKG Interpretation  Date/Time:  Wednesday March 25 2021 09:18:29 EDT Ventricular Rate:  47 PR Interval:  170 QRS Duration: 99 QT Interval:  449 QTC Calculation: 397 R Axis:   65 Text Interpretation: Sinus bradycardia no old no STEMI Confirmed by Charlesetta Shanks [phone]) on 03/25/2021 9:43:52 AM  Radiology DG Chest Portable 1 View  Result Date: 03/25/2021 CLINICAL DATA:  Chest pain beginning this morning EXAM: PORTABLE CHEST 1 VIEW COMPARISON:  02/20/2019 FINDINGS: Cardiomediastinal silhouette and pulmonary vasculature are within normal limits. Lungs are hyperexpanded, but otherwise clear clear. IMPRESSION: No acute cardiopulmonary process. Electronically Signed   By: Miachel Roux M.D.   On: 03/25/2021 10:02   CT Angio Chest/Abd/Pel for Dissection W and/or W/WO  Result Date: 03/25/2021 CLINICAL DATA:  Abdominal and chest pain radiating to the back. Concern for dissection. EXAM: CT ANGIOGRAPHY CHEST, ABDOMEN AND PELVIS TECHNIQUE: Non-contrast CT of the chest was initially obtained. Multidetector CT imaging through the chest, abdomen and pelvis was performed using the standard protocol during bolus administration of intravenous contrast. Multiplanar reconstructed images and MIPs were obtained and reviewed to evaluate the vascular anatomy. CONTRAST:  14m OMNIPAQUE IOHEXOL 350 MG/ML SOLN COMPARISON:  MRI abdomen 03/27/2019 FINDINGS: CTA CHEST FINDINGS Cardiovascular: No aortic intramural hematoma identified on precontrast images. No aortic dissection or aneurysm. Heart size is within normal limits. Mediastinum/Nodes: 5.9 x 2.5 x 2.0 cm ovoid mass noted in the anterior mediastinum. No additional enlarged hilar, axillary, or mediastinal lymph nodes are identified. Lungs/Pleura: Lungs are clear. No pleural effusion or pneumothorax. Musculoskeletal: No chest wall  abnormality. No acute or significant osseous findings. Review of the MIP images confirms the above findings. CTA ABDOMEN AND PELVIS FINDINGS VASCULAR Aorta: Scattered calcified atheromatous plaque without aneurysm. Celiac: Patent without evidence of aneurysm, dissection, vasculitis or significant stenosis. SMA: Patent without evidence of aneurysm, dissection, vasculitis or significant stenosis. Renals: Both renal arteries are patent without evidence of aneurysm, dissection, vasculitis, fibromuscular dysplasia or significant stenosis. IMA: Patent without evidence of aneurysm, dissection, vasculitis or significant stenosis. Inflow: Patent without evidence of aneurysm, dissection, vasculitis or significant stenosis. Veins: No obvious venous abnormality within the limitations of this arterial phase study. Review of the MIP images confirms the above findings. NON-VASCULAR Hepatobiliary: No focal liver abnormality is seen. No gallstones, gallbladder wall thickening,  or biliary dilatation. Pancreas: Unremarkable. No pancreatic ductal dilatation or surrounding inflammatory changes. Spleen: Normal in size without focal abnormality. Adrenals/Urinary Tract: Adrenal glands are normal. Lobulated low-density structures in the left renal hilum most likely due to renal sinus cysts. Kidneys and ureters otherwise unremarkable. Soft tissue thickening extending from the anterior bladder dome towards the umbilicus suggestive of urachal remnant tissue. Bladder otherwise normal. Stomach/Bowel: Mild diffuse thickening of the esophageal wall. No significant abnormality of the stomach. Diverticulosis of the sigmoid colon without evidence of acute diverticulitis. The appendix is normal. Lymphatic: No enlarged abdominal or pelvic lymph nodes identified. Reproductive: Mildly enlarged prostate. Other: Left inguinal hernia repair changes are seen. Musculoskeletal: No acute or significant osseous findings. Review of the MIP images confirms the  above findings. IMPRESSION: 1. No acute abnormality of the chest, abdomen, or pelvis. 2. No aortic dissection. 3. Anterior mediastinal mass measuring 5.9 x 2.5 x 2.0 cm, most likely related to abnormally enlarged lymph node or thymoma. Cardiothoracic surgery consultation should be considered. 4. Diffuse thickening of the esophageal wall may be due to under distension, however esophagitis can have a similar appearance. Electronically Signed   By: Miachel Roux M.D.   On: 03/25/2021 10:52    Procedures Procedures  CRITICAL CARE Performed by: Charlesetta Shanks   Total critical care time: 30 minutes  Critical care time was exclusive of separately billable procedures and treating other patients.  Critical care was necessary to treat or prevent imminent or life-threatening deterioration.  Critical care was time spent personally by me on the following activities: development of treatment plan with patient and/or surrogate as well as nursing, discussions with consultants, evaluation of patient's response to treatment, examination of patient, obtaining history from patient or surrogate, ordering and performing treatments and interventions, ordering and review of laboratory studies, ordering and review of radiographic studies, pulse oximetry and re-evaluation of patient's condition.  Medications Ordered in ED Medications  HYDROmorphone (DILAUDID) injection 1 mg (1 mg Intravenous Given 03/25/21 1709)  lactated ringers infusion ( Intravenous New Bag/Given 03/25/21 1650)  morphine 4 MG/ML injection 4 mg (4 mg Intravenous Given 03/25/21 0952)  pantoprazole (PROTONIX) injection 40 mg (40 mg Intravenous Given 03/25/21 0955)  iohexol (OMNIPAQUE) 350 MG/ML injection 75 mL (75 mLs Intravenous Contrast Given 03/25/21 1018)  HYDROmorphone (DILAUDID) injection 1 mg (1 mg Intravenous Given 03/25/21 1050)  famotidine (PEPCID) IVPB 20 mg premix (0 mg Intravenous Stopped 03/25/21 1203)  lactated ringers bolus 1,000 mL (0 mLs  Intravenous Stopped 03/25/21 1650)    ED Course  I have reviewed the triage vital signs and the nursing notes.  Pertinent labs & imaging results that were available during my care of the patient were reviewed by me and considered in my medical decision making (see chart for details).  Clinical Course as of 04/04/21 1402  Wed Mar 25, 2021  1536 Consult: Dr.Sheikh accepts for admission to [facility]. [MP]  1546 Consult: Reviewed with cardiothoracic nurse.  Will review with on-call physician. [MP]    Clinical Course User Index [MP] Charlesetta Shanks, MD   MDM Rules/Calculators/A&P                           Patient presents with severe lower thoracic back/upper abdominal pain.  Symptoms seem to have onset yesterday evening but were not severe at onset.  Predominantly some nausea patient attributed to food related symptoms.  This morning, patient developed severe pain that has been coming in recurrent  waves of spasmodic pain in the area of the lower left thoracic chest and upper abdomen with radiation to the shoulder as well.  She will concern with severity of pain was for possible bowel obstruction, perforation, aneurysm, dissection, acute splenic condition.  EKG did not show acute ischemic pattern and first troponin without elevation to suggest MI.  Chest x-ray reviewed immediately by myself did not show mediastinal widening, pneumothorax, air under the diaphragm or obvious pulmonary process.  CT angiogram obtained to rule out dissection, aneurysm, other structural intra-abdominal processes.  CT scan did not clearly illustrate emergent condition.  There is note of a mediastinal soft tissue mass deemed likely to be reactive lymph node or thymoma.  This appears less likely to be the etiology of patient's pain and less there is some nerve involvement or compression causing referred pain.  Esophagus is questionably thickened.  Patient may have esophagitis, consideration given to esophageal spasm.  Pain  however was recurrent and severe did not respond to PPIs or Pepcid.  Patient required repeat dosing of Dilaudid.  Plan will be for admission for further diagnostic evaluation and pain control. Final Clinical Impression(s) / ED Diagnoses Final diagnoses:  Intractable abdominal pain  Mediastinal mass  Acute left-sided thoracic back pain    Rx / DC Orders ED Discharge Orders     None        Charlesetta Shanks, MD 04/04/21 [phone]

## 2021-03-25 NOTE — ED Triage Notes (Signed)
Pt from home stated that he started that he had indegestion last night and then this morning he woke up with Chest pain all long his left side and radiating toward his back.   Pt took 2 low dose aspirin this morning before going to the hospital.Pt is unsure of mg of Asprin given

## 2021-03-25 NOTE — Progress Notes (Addendum)
Pt received in 4E27 via carelink.  CCMD notified, assessment completed, and pt oriented to room.  MD at bedside as well.  Pain noted as minimal at this point but pt received a dose of dilaudid immediately prior to transport.  Call bell in reach, will continue to monitor.

## 2021-03-25 NOTE — Plan of Care (Addendum)
[facility] -> Douglas Cox DOB 11-04-1950   Patient presents with discomfort intractable left sort of upper abdomen lower thoracic pain came on pretty acutely.There was initial concern for aortic dissection vs bowel obstruction but had a CTA done and ruled this out. He continued have a lot of pain.  Further work-up was done and shows a anterior mediastinal masses most likely related to enlarged lymph node versus thymoma.  Cardiothoracic surgery consultation is required.  Patient is hemodynamically stable.  Pain is out of proportion to his exam per EDP,  Needs further work-up.  Initially given Dilaudid but continues to have some rebound pain.  Patient has been accepted to medical telemetry at [facility]. Will need further workup for Chest/Abdominal Pain that is spasmodic and radiating to Left Shoulder. Patient is nauseated but no vomiting.

## 2021-03-25 NOTE — H&P (Signed)
History and Physical    Douglas Cox BTD:[id number] DOB: July 30, 1951 DOA: 03/25/2021  PCP: Lorene Dy, MD   Patient coming from: Home  I have personally briefly reviewed patient's old medical records in [place]  Chief Complaint: Abdominal Pain  HPI: Douglas Cox is a 70 y.o. adult with medical history significant for celiac disease, alcohol dependence and marijuana use who presents to the emergency room for evaluation of sudden onset pain mostly in the left upper quadrant, left lumbar area and extending to the left lateral chest wall with radiation to his back.  He rated his pain a 10 x 10 in intensity at its worst.  Pain was associated with nausea but he denies having any vomiting. Patient states that he ate at a restaurant the evening prior to his admission and had a Caesar salad with tuna (gluten containing).  He woke up the next morning feeling bloated and had episodes of diarrhea prior to being this abdominal/chest wall pain.  He states that he has never had symptoms like this in the past which prompted his visit to the emergency room. He took 2 baby aspirin prior to going to the ER. In the ER he received IV Dilaudid with improvement in his pain and at the time of this history and physical he is pain-free. He denies having any fever, no chills, no cough, no dysphagia , no odynophagia, no headache, no dizziness, no lightheadedness, no urinary frequency, no nocturia, no dysuria, no focal deficits, no blurred vision, no palpitations or diaphoresis. Labs show sodium 140, potassium 4.3, chloride 105, bicarb 26, glucose 99, BUN 15, creatinine 0.97, calcium 8.6, alkaline phosphatase 39, albumin 3.6, lipase 17, AST 15, ALT 15, total protein 6.8, troponin 5, white count 3.9, hemoglobin 14.6, hematocrit 44, MCV 100, RDW 13.1, platelet count 290 Respiratory viral panel is negative Chest x-ray reviewed by me shows no evidence of acute cardiopulmonary process CT angiogram of the  chest/abdomen/pelvis shows no acute abnormality.  No aortic dissection.  Anterior mediastinal mass measuring 5 x 9 x 2 0.5 x 2 most likely related to abnormally enlarged lymph node or thymoma.  CT surgery consultation should be considered. Diffuse thickening of esophageal wall rule out possible esophagitis. Twelve-lead EKG reviewed by me shows sinus bradycardia     ED Course: Patient is a 70 year old male who presents to the emergency room for evaluation of sudden onset pain in the left upper quadrant, left lumbar area and left lateral chest wall which he rated 10 x 10 in intensity at its worst with radiation to his back associated with nausea but no vomiting.  Pain improved following administration of IV Dilaudid. He is currently chest pain-free. Imaging shows an anterior mediastinal mass concerning for possible thymoma/enlarged lymph node as well as possible esophagitis.[name] He will be admitted to the hospital for further evaluation.   Review of Systems: As per HPI otherwise all other systems reviewed and negative.    Past Medical History:  Diagnosis Date   Celiac disease     Past Surgical History:  Procedure Laterality Date   CATARACT EXTRACTION Bilateral    Dr. Valetta Close   FRACTURE SURGERY     HERNIA REPAIR       reports that she quit smoking about 15 years ago. She has never used smokeless tobacco. She reports current alcohol use of about 4.0 - 5.0 standard drinks per week. She reports current drug use. Frequency: 7.00 times per week. Drug: Marijuana.  Allergies  Allergen Reactions   Codone [  Hydrocodone] Hives   Clarithromycin Anxiety    History reviewed. No pertinent family history.  No significant family history   Prior to Admission medications   Medication Sig Start Date End Date Taking? Authorizing Provider  acidophilus (RISAQUAD) CAPS capsule Take 1 capsule by mouth daily. Align once daily   Yes [provider]  cholecalciferol (VITAMIN D3) 25 MCG (1000 UNIT)  tablet Take 1,000 Units by mouth daily.   Yes [provider]  Cyanocobalamin 1000 MCG/ML KIT Inject 1 mL as directed. Every 2 weeks   Yes [provider]  psyllium (METAMUCIL) 58.6 % powder Take 1 packet by mouth daily.   Yes [provider]    Physical Exam: Vitals:   03/25/21 1426 03/25/21 1641 03/25/21 1652 03/25/21 1800  BP: (!) 185/72 (!) 153/61  (!) 164/79  Pulse: (!) 44   (!) 46  Resp: _0 Temp:   97.7 F (36.5 C) 98.7 F (37.1 C)  TempSrc:    Oral  SpO2: 96%   99%  Weight:      Height:         Vitals:   03/25/21 1426 03/25/21 1641 03/25/21 1652 03/25/21 1800  BP: (!) 185/72 (!) 153/61  (!) 164/79  Pulse: (!) 44   (!) 46  Resp: _1 Temp:   97.7 F (36.5 C) 98.7 F (37.1 C)  TempSrc:    Oral  SpO2: 96%   99%  Weight:      Height:          Constitutional: Alert and oriented x 3 . Not in any apparent distress HEENT:      Head: Normocephalic and atraumatic.         Eyes: PERLA, EOMI, Conjunctivae are normal. Sclera is non-icteric.       Mouth/Throat: Mucous membranes are moist.       Neck: Supple with no signs of meningismus. Cardiovascular: Bradycardia. No murmurs, gallops, or rubs. 2+ symmetrical distal pulses are present . No JVD. No LE edema Respiratory: Respiratory effort normal .Lungs sounds clear bilaterally. No wheezes, crackles, or rhonchi.  Gastrointestinal: Soft, non tender, and non distended with positive bowel sounds.  Genitourinary: No CVA tenderness. Musculoskeletal: Nontender with normal range of motion in all extremities. No cyanosis, or erythema of extremities. Neurologic:  Face is symmetric. Moving all extremities. No gross focal neurologic deficits . Skin: Skin is warm, dry.  No rash or ulcers Psychiatric: Mood and affect are normal    Labs on Admission: I have personally reviewed following labs and imaging studies  CBC: Recent Labs  Lab 03/25/21 0927  WBC 3.9*  HGB 14.6  HCT 44.4  MCV  100.7*  PLT 503   Basic Metabolic Panel: Recent Labs  Lab 03/25/21 0927  NA 140  K 4.3  CL 105  CO2 26  GLUCOSE 99  BUN 15  CREATININE 0.97  CALCIUM 8.6*   GFR: Estimated Creatinine Clearance (by C-G formula based on SCr of 0.97 mg/dL) Male: 58.4 mL/min Male: 70.5 mL/min Liver Function Tests: Recent Labs  Lab 03/25/21 0950  AST 15  ALT 15  ALKPHOS 39  BILITOT 0.9  PROT 6.8  ALBUMIN 3.6   Recent Labs  Lab 03/25/21 0950  LIPASE 17   No results for input(s): AMMONIA in the last 168 hours. Coagulation Profile: No results for input(s): INR, PROTIME in the last 168 hours. Cardiac Enzymes: No results for input(s): CKTOTAL, CKMB, CKMBINDEX, TROPONINI in the last 168  hours. BNP (last 3 results) No results for input(s): PROBNP in the last 8760 hours. HbA1C: No results for input(s): HGBA1C in the last 72 hours. CBG: No results for input(s): GLUCAP in the last 168 hours. Lipid Profile: No results for input(s): CHOL, HDL, LDLCALC, TRIG, CHOLHDL, LDLDIRECT in the last 72 hours. Thyroid Function Tests: No results for input(s): TSH, T4TOTAL, FREET4, T3FREE, THYROIDAB in the last 72 hours. Anemia Panel: No results for input(s): VITAMINB12, FOLATE, FERRITIN, TIBC, IRON, RETICCTPCT in the last 72 hours. Urine analysis: No results found for: COLORURINE, APPEARANCEUR, [place], [place], [place], [place], BILIRUBINUR, KETONESUR, PROTEINUR, UROBILINOGEN, NITRITE, LEUKOCYTESUR  Radiological Exams on Admission: DG Chest Portable 1 View  Result Date: 03/25/2021 CLINICAL DATA:  Chest pain beginning this morning EXAM: PORTABLE CHEST 1 VIEW COMPARISON:  02/20/2019 FINDINGS: Cardiomediastinal silhouette and pulmonary vasculature are within normal limits. Lungs are hyperexpanded, but otherwise clear clear. IMPRESSION: No acute cardiopulmonary process. Electronically Signed   By: Miachel Roux M.D.   On: 03/25/2021 10:02   CT Angio Chest/Abd/Pel for Dissection W and/or W/WO  Result  Date: 03/25/2021 CLINICAL DATA:  Abdominal and chest pain radiating to the back. Concern for dissection. EXAM: CT ANGIOGRAPHY CHEST, ABDOMEN AND PELVIS TECHNIQUE: Non-contrast CT of the chest was initially obtained. Multidetector CT imaging through the chest, abdomen and pelvis was performed using the standard protocol during bolus administration of intravenous contrast. Multiplanar reconstructed images and MIPs were obtained and reviewed to evaluate the vascular anatomy. CONTRAST:  67m OMNIPAQUE IOHEXOL 350 MG/ML SOLN COMPARISON:  MRI abdomen 03/27/2019 FINDINGS: CTA CHEST FINDINGS Cardiovascular: No aortic intramural hematoma identified on precontrast images. No aortic dissection or aneurysm. Heart size is within normal limits. Mediastinum/Nodes: 5.9 x 2.5 x 2.0 cm ovoid mass noted in the anterior mediastinum. No additional enlarged hilar, axillary, or mediastinal lymph nodes are identified. Lungs/Pleura: Lungs are clear. No pleural effusion or pneumothorax. Musculoskeletal: No chest wall abnormality. No acute or significant osseous findings. Review of the MIP images confirms the above findings. CTA ABDOMEN AND PELVIS FINDINGS VASCULAR Aorta: Scattered calcified atheromatous plaque without aneurysm. Celiac: Patent without evidence of aneurysm, dissection, vasculitis or significant stenosis. SMA: Patent without evidence of aneurysm, dissection, vasculitis or significant stenosis. Renals: Both renal arteries are patent without evidence of aneurysm, dissection, vasculitis, fibromuscular dysplasia or significant stenosis. IMA: Patent without evidence of aneurysm, dissection, vasculitis or significant stenosis. Inflow: Patent without evidence of aneurysm, dissection, vasculitis or significant stenosis. Veins: No obvious venous abnormality within the limitations of this arterial phase study. Review of the MIP images confirms the above findings. NON-VASCULAR Hepatobiliary: No focal liver abnormality is seen. No  gallstones, gallbladder wall thickening, or biliary dilatation. Pancreas: Unremarkable. No pancreatic ductal dilatation or surrounding inflammatory changes. Spleen: Normal in size without focal abnormality. Adrenals/Urinary Tract: Adrenal glands are normal. Lobulated low-density structures in the left renal hilum most likely due to renal sinus cysts. Kidneys and ureters otherwise unremarkable. Soft tissue thickening extending from the anterior bladder dome towards the umbilicus suggestive of urachal remnant tissue. Bladder otherwise normal. Stomach/Bowel: Mild diffuse thickening of the esophageal wall. No significant abnormality of the stomach. Diverticulosis of the sigmoid colon without evidence of acute diverticulitis. The appendix is normal. Lymphatic: No enlarged abdominal or pelvic lymph nodes identified. Reproductive: Mildly enlarged prostate. Other: Left inguinal hernia repair changes are seen. Musculoskeletal: No acute or significant osseous findings. Review of the MIP images confirms the above findings. IMPRESSION: 1. No acute abnormality of the chest, abdomen, or pelvis. 2. No aortic dissection. 3.  Anterior mediastinal mass measuring 5.9 x 2.5 x 2.0 cm, most likely related to abnormally enlarged lymph node or thymoma. Cardiothoracic surgery consultation should be considered. 4. Diffuse thickening of the esophageal wall may be due to under distension, however esophagitis can have a similar appearance. Electronically Signed   By: Miachel Roux M.D.   On: 03/25/2021 10:52     Assessment/Plan Principal Problem:   Mediastinal mass Active Problems:   Celiac disease   Alcohol dependence (HCC)   Bradycardia     Mediastinal mass Incidental finding on CT angiogram of the chest/abdomen/pelvis which was done for evaluation of abdominal pain Possible thymoma versus lymphadenopathy CT surgery consult in a.m.    Abdominal pain Concerning for possible flareup of his known celiac disease Patient states  that this pain is different from his previous flare ups Supportive care with pain medication, antiemetics and IV PPI for possible esophagitis.    Alcohol dependence Place patient on CIWA protocol and administer Lorazepam for CIWA score of 8 or greater Place patient on MVI, thiamine and folic acid    Bradycardia Asymptomatic Obtain TSH levels  DVT prophylaxis: Lovenox  Code Status: full code  Family Communication: Greater than 50% of time was spent discussing plan of care with patient at the bedside.  All questions and concerns have been addressed.  He verbalizes understanding and agrees with the plan. Disposition Plan: Back to previous home environment Consults called: CT surgery in a.m. Status:Inpatient    Collier Bullock MD [facility]     03/25/2021, 6:33 PM

## 2021-03-26 DIAGNOSIS — K900 Celiac disease: Secondary | ICD-10-CM | POA: Diagnosis not present

## 2021-03-26 DIAGNOSIS — R001 Bradycardia, unspecified: Secondary | ICD-10-CM | POA: Diagnosis not present

## 2021-03-26 DIAGNOSIS — J9859 Other diseases of mediastinum, not elsewhere classified: Secondary | ICD-10-CM | POA: Diagnosis not present

## 2021-03-26 DIAGNOSIS — F1029 Alcohol dependence with unspecified alcohol-induced disorder: Secondary | ICD-10-CM

## 2021-03-26 LAB — BASIC METABOLIC PANEL
Anion gap: 6 (ref 5–15)
BUN: 9 mg/dL (ref 8–23)
CO2: 24 mmol/L (ref 22–32)
Calcium: 8.2 mg/dL — ABNORMAL LOW (ref 8.9–10.3)
Chloride: 107 mmol/L (ref 98–111)
Creatinine, Ser: 1.1 mg/dL (ref 0.61–1.24)
GFR, Estimated: >60 mL/min (ref >60)
Glucose, Bld: 95 mg/dL (ref 70–99)
Potassium: 3.7 mmol/L (ref 3.5–5.1)
Sodium: 137 mmol/L (ref 135–145)

## 2021-03-26 LAB — CBC
HCT: 39.2 % (ref 39.0–52.0)
Hemoglobin: 12.9 g/dL — ABNORMAL LOW (ref 13.0–17.0)
MCH: 33.1 pg (ref 26.0–34.0)
MCHC: 32.9 g/dL (ref 30.0–36.0)
MCV: 100.5 fL — ABNORMAL HIGH (ref 80.0–100.0)
Platelets: 241 10*3/uL (ref 150–400)
RBC: 3.9 MIL/uL — ABNORMAL LOW (ref 4.22–5.81)
RDW: 13.1 % (ref 11.5–15.5)
WBC: 5.5 10*3/uL (ref 4.0–10.5)
nRBC: 0 % (ref 0.0–0.2)

## 2021-03-26 NOTE — Discharge Summary (Signed)
Physician Discharge Summary  Hicks Feick UUV:[id number] DOB: Jun 11, 1951 DOA: 03/25/2021  PCP: Lorene Dy, MD  Admit date: 03/25/2021 Discharge date: 03/26/2021  Admitted From: Home  Disposition:  Home   Recommendations for Outpatient Follow-up and new medication changes:  Follow up with Dr. Mancel Bale in 7 to 10 days.   Abdominal symptoms have resolved.  Follow up with cardiothoracic surgery Dr Kipp Brood as scheduled for mediastinal mass.   Home Health: no   Equipment/Devices: no    Discharge Condition: stable CODE STATUS: full  Diet recommendation:  regular.   Brief/Interim Summary: Douglas Cox was admitted to the hospital with the working diagnosis of nonspecific abdominal pain.  Incidental newly diagnosed mediastinal mass.  70 year old male past medical history for celiac disease, alcohol dependence and THC use who presented with acute onset of abdominal pain, left upper quadrant, left lumbar area extending into the left lateral chest and radiating to his back.  Severe intensity, associated with nausea but no vomiting.  On his initial physical examination blood pressure 185/72, heart rate 46, respiratory rate 13, temperature 98.7, oxygen saturation 96%.  His lungs were clear to auscultation bilaterally, heart S1-S2, present, rhythmic, soft abdomen, no lower extremity edema.   Sodium 140, potassium 4.3, chloride 105, bicarb 26, glucose 99, BUN 15, creatinine 0.97, high sensitive troponin 5-5, white count 3.9, hemoglobin 14.6, hematocrit 44.4, platelets 290. SARS COVID-19 negative.   Chest radiograph with hyperinflation, tenting right hemidiaphragm.   CT chest, abdomen, pelvis with anterior mediastinal mass, 5.9 x 2.5 x 2.0 cm.  Likely related to enlarged lymph node or thymoma.   EKG 47 bpm, normal axis, normal intervals, sinus rhythm, no significant ST segment or T wave changes.  Patient's abdominal symptoms self resolved.   Celiac disease with abdominal pain.  Patient  received supportive medical therapy including intravenous fluids, antiacids and as needed antiemetics By the following day his symptoms have completely resolved.  2.  Newly diagnosed mediastinal mass.  Incidental finding of 5.9 x 2.5 x 2.0 mass, ovoid in shape, anterior mediastinum, no additional large hilar, axillary or mediastinal lymph nodes identified.  No evidence of vascular or airway compromise. Patient will follow-up as an outpatient.  3.  Alcohol use.  No signs of acute alcohol withdrawal syndrome.   Discharge Diagnoses:  Principal Problem:   Mediastinal mass Active Problems:   Celiac disease   Alcohol dependence ([place])   Bradycardia    Discharge Instructions   Allergies as of 03/26/2021       Reactions   Codone [hydrocodone] Hives   Clarithromycin Anxiety        Medication List     TAKE these medications    acidophilus Caps capsule Take 1 capsule by mouth daily. Align once daily   cholecalciferol 25 MCG (1000 UNIT) tablet Commonly known as: VITAMIN D3 Take 1,000 Units by mouth daily.   Cyanocobalamin 1000 MCG/ML Kit Inject 1 mL as directed. Every 2 weeks   psyllium 58.6 % powder Commonly known as: METAMUCIL Take 1 packet by mouth daily.        Allergies  Allergen Reactions   Codone [Hydrocodone] Hives   Clarithromycin Anxiety    Consultations: patient will follow up with CT surgery as outpatient    Procedures/Studies: DG Chest Portable 1 View  Result Date: 03/25/2021 CLINICAL DATA:  Chest pain beginning this morning EXAM: PORTABLE CHEST 1 VIEW COMPARISON:  02/20/2019 FINDINGS: Cardiomediastinal silhouette and pulmonary vasculature are within normal limits. Lungs are hyperexpanded, but otherwise clear clear. IMPRESSION: No acute  cardiopulmonary process. Electronically Signed   By: Miachel Roux M.D.   On: 03/25/2021 10:02   CT Angio Chest/Abd/Pel for Dissection W and/or W/WO  Result Date: 03/25/2021 CLINICAL DATA:  Abdominal and chest pain  radiating to the back. Concern for dissection. EXAM: CT ANGIOGRAPHY CHEST, ABDOMEN AND PELVIS TECHNIQUE: Non-contrast CT of the chest was initially obtained. Multidetector CT imaging through the chest, abdomen and pelvis was performed using the standard protocol during bolus administration of intravenous contrast. Multiplanar reconstructed images and MIPs were obtained and reviewed to evaluate the vascular anatomy. CONTRAST:  62m OMNIPAQUE IOHEXOL 350 MG/ML SOLN COMPARISON:  MRI abdomen 03/27/2019 FINDINGS: CTA CHEST FINDINGS Cardiovascular: No aortic intramural hematoma identified on precontrast images. No aortic dissection or aneurysm. Heart size is within normal limits. Mediastinum/Nodes: 5.9 x 2.5 x 2.0 cm ovoid mass noted in the anterior mediastinum. No additional enlarged hilar, axillary, or mediastinal lymph nodes are identified. Lungs/Pleura: Lungs are clear. No pleural effusion or pneumothorax. Musculoskeletal: No chest wall abnormality. No acute or significant osseous findings. Review of the MIP images confirms the above findings. CTA ABDOMEN AND PELVIS FINDINGS VASCULAR Aorta: Scattered calcified atheromatous plaque without aneurysm. Celiac: Patent without evidence of aneurysm, dissection, vasculitis or significant stenosis. SMA: Patent without evidence of aneurysm, dissection, vasculitis or significant stenosis. Renals: Both renal arteries are patent without evidence of aneurysm, dissection, vasculitis, fibromuscular dysplasia or significant stenosis. IMA: Patent without evidence of aneurysm, dissection, vasculitis or significant stenosis. Inflow: Patent without evidence of aneurysm, dissection, vasculitis or significant stenosis. Veins: No obvious venous abnormality within the limitations of this arterial phase study. Review of the MIP images confirms the above findings. NON-VASCULAR Hepatobiliary: No focal liver abnormality is seen. No gallstones, gallbladder wall thickening, or biliary dilatation.  Pancreas: Unremarkable. No pancreatic ductal dilatation or surrounding inflammatory changes. Spleen: Normal in size without focal abnormality. Adrenals/Urinary Tract: Adrenal glands are normal. Lobulated low-density structures in the left renal hilum most likely due to renal sinus cysts. Kidneys and ureters otherwise unremarkable. Soft tissue thickening extending from the anterior bladder dome towards the umbilicus suggestive of urachal remnant tissue. Bladder otherwise normal. Stomach/Bowel: Mild diffuse thickening of the esophageal wall. No significant abnormality of the stomach. Diverticulosis of the sigmoid colon without evidence of acute diverticulitis. The appendix is normal. Lymphatic: No enlarged abdominal or pelvic lymph nodes identified. Reproductive: Mildly enlarged prostate. Other: Left inguinal hernia repair changes are seen. Musculoskeletal: No acute or significant osseous findings. Review of the MIP images confirms the above findings. IMPRESSION: 1. No acute abnormality of the chest, abdomen, or pelvis. 2. No aortic dissection. 3. Anterior mediastinal mass measuring 5.9 x 2.5 x 2.0 cm, most likely related to abnormally enlarged lymph node or thymoma. Cardiothoracic surgery consultation should be considered. 4. Diffuse thickening of the esophageal wall may be due to under distension, however esophagitis can have a similar appearance. Electronically Signed   By: FMiachel RouxM.D.   On: 03/25/2021 10:52     Subjective: Patient is feeling better, no nausea or vomiting,. No abdominal pain and tolerating po well. No chest pain or dyspnea.   Discharge Exam: Vitals:   03/26/21 0310 03/26/21 0749  BP: 123/68 115/70  Pulse: (!) 58 (!) 45  Resp: 16 15  Temp: 98.7 F (37.1 C) 98.6 F (37 C)  SpO2: 98% 96%   Vitals:   03/25/21 2338 03/25/21 2342 03/26/21 0310 03/26/21 0749  BP: 113/69 113/69 123/68 115/70  Pulse: (!) 55  (!) 58 (!) 45  Resp: 11 17  16 15  Temp: 98.7 F (37.1 C) 98.7 F (37.1  C) 98.7 F (37.1 C) 98.6 F (37 C)  TempSrc: Oral Oral Oral Oral  SpO2: 98%  98% 96%  Weight:      Height:        General: Not in pain or dyspnea Neurology: Awake and alert, non focal  E ENT: no pallor, no icterus, oral mucosa moist Cardiovascular: No JVD. S1-S2 present, rhythmic. No lower extremity edema. Pulmonary: vesicular with no wheezing,  Gastrointestinal. Abdomen non tender  Skin. No rashes Musculoskeletal: no joint deformities   The results of significant diagnostics from this hospitalization (including imaging, microbiology, ancillary and laboratory) are listed below for reference.     Microbiology: Recent Results (from the past 240 hour(s))  Resp Panel by RT-PCR (Flu A&B, Covid) Nasopharyngeal Swab     Status: None   Collection Time: 03/25/21  4:22 PM   Specimen: Nasopharyngeal Swab; Nasopharyngeal(NP) swabs in vial transport medium  Result Value Ref Range Status   SARS Coronavirus 2 by RT PCR NEGATIVE NEGATIVE Final    Comment: (NOTE) SARS-CoV-2 target nucleic acids are NOT DETECTED.  The SARS-CoV-2 RNA is generally detectable in upper respiratory specimens during the acute phase of infection. The lowest concentration of SARS-CoV-2 viral copies this assay can detect is 138 copies/mL. A negative result does not preclude SARS-Cov-2 infection and should not be used as the sole basis for treatment or other patient management decisions. A negative result may occur with  improper specimen collection/handling, submission of specimen other than nasopharyngeal swab, presence of viral mutation(s) within the areas targeted by this assay, and inadequate number of viral copies(<138 copies/mL). A negative result must be combined with clinical observations, patient history, and epidemiological information. The expected result is Negative.  Fact Sheet for Patients:  EntrepreneurPulse.com.au  Fact Sheet for Healthcare Providers:   IncredibleEmployment.be  This test is no t yet approved or cleared by the [place] FDA and  has been authorized for detection and/or diagnosis of SARS-CoV-2 by FDA under an Emergency Use Authorization (EUA). This EUA will remain  in effect (meaning this test can be used) for the duration of the COVID-19 declaration under Section 564(b)(1) of the Act, 21 U.S.C.section 360bbb-3(b)(1), unless the authorization is terminated  or revoked sooner.       Influenza A by PCR NEGATIVE NEGATIVE Final   Influenza B by PCR NEGATIVE NEGATIVE Final    Comment: (NOTE) The Xpert Xpress SARS-CoV-2/FLU/RSV plus assay is intended as an aid in the diagnosis of influenza from Nasopharyngeal swab specimens and should not be used as a sole basis for treatment. Nasal washings and aspirates are unacceptable for Xpert Xpress SARS-CoV-2/FLU/RSV testing.  Fact Sheet for Patients: EntrepreneurPulse.com.au  Fact Sheet for Healthcare Providers: IncredibleEmployment.be  This test is not yet approved or cleared by the [place] FDA and has been authorized for detection and/or diagnosis of SARS-CoV-2 by FDA under an Emergency Use Authorization (EUA). This EUA will remain in effect (meaning this test can be used) for the duration of the COVID-19 declaration under Section 564(b)(1) of the Act, 21 U.S.C. section 360bbb-3(b)(1), unless the authorization is terminated or revoked.  Performed at [facility], [address], [place], [place] [address]      Labs: BNP (last 3 results) No results for input(s): BNP in the last 8760 hours. Basic Metabolic Panel: Recent Labs  Lab 03/25/21 0927 03/25/21 1855 03/26/21 0217  NA 140  --  137  K 4.3  --  3.7  CL 105  --  107  CO2 26  --  24  GLUCOSE 99  --  95  BUN 15  --  9  CREATININE 0.97  --  1.10  CALCIUM 8.6*  --  8.2*  MG  --  1.8  --    Liver Function Tests: Recent Labs   Lab 03/25/21 0950  AST 15  ALT 15  ALKPHOS 39  BILITOT 0.9  PROT 6.8  ALBUMIN 3.6   Recent Labs  Lab 03/25/21 0950  LIPASE 17   No results for input(s): AMMONIA in the last 168 hours. CBC: Recent Labs  Lab 03/25/21 0927 03/26/21 0217  WBC 3.9* 5.5  HGB 14.6 12.9*  HCT 44.4 39.2  MCV 100.7* 100.5*  PLT 290 241   Cardiac Enzymes: No results for input(s): CKTOTAL, CKMB, CKMBINDEX, TROPONINI in the last 168 hours. BNP: Invalid input(s): POCBNP CBG: No results for input(s): GLUCAP in the last 168 hours. D-Dimer No results for input(s): DDIMER in the last 72 hours. Hgb A1c No results for input(s): HGBA1C in the last 72 hours. Lipid Profile No results for input(s): CHOL, HDL, LDLCALC, TRIG, CHOLHDL, LDLDIRECT in the last 72 hours. Thyroid function studies Recent Labs    03/25/21 1855  TSH 8.084*   Anemia work up No results for input(s): VITAMINB12, FOLATE, FERRITIN, TIBC, IRON, RETICCTPCT in the last 72 hours. Urinalysis No results found for: COLORURINE, APPEARANCEUR, [place], [place], GLUCOSEU, [place], [place], [place], PROTEINUR, UROBILINOGEN, NITRITE, LEUKOCYTESUR Sepsis Labs Invalid input(s): PROCALCITONIN,  WBC,  LACTICIDVEN Microbiology Recent Results (from the past 240 hour(s))  Resp Panel by RT-PCR (Flu A&B, Covid) Nasopharyngeal Swab     Status: None   Collection Time: 03/25/21  4:22 PM   Specimen: Nasopharyngeal Swab; Nasopharyngeal(NP) swabs in vial transport medium  Result Value Ref Range Status   SARS Coronavirus 2 by RT PCR NEGATIVE NEGATIVE Final    Comment: (NOTE) SARS-CoV-2 target nucleic acids are NOT DETECTED.  The SARS-CoV-2 RNA is generally detectable in upper respiratory specimens during the acute phase of infection. The lowest concentration of SARS-CoV-2 viral copies this assay can detect is 138 copies/mL. A negative result does not preclude SARS-Cov-2 infection and should not be used as the sole basis for treatment or other  patient management decisions. A negative result may occur with  improper specimen collection/handling, submission of specimen other than nasopharyngeal swab, presence of viral mutation(s) within the areas targeted by this assay, and inadequate number of viral copies(<138 copies/mL). A negative result must be combined with clinical observations, patient history, and epidemiological information. The expected result is Negative.  Fact Sheet for Patients:  EntrepreneurPulse.com.au  Fact Sheet for Healthcare Providers:  IncredibleEmployment.be  This test is no t yet approved or cleared by the [place] FDA and  has been authorized for detection and/or diagnosis of SARS-CoV-2 by FDA under an Emergency Use Authorization (EUA). This EUA will remain  in effect (meaning this test can be used) for the duration of the COVID-19 declaration under Section 564(b)(1) of the Act, 21 U.S.C.section 360bbb-3(b)(1), unless the authorization is terminated  or revoked sooner.       Influenza A by PCR NEGATIVE NEGATIVE Final   Influenza B by PCR NEGATIVE NEGATIVE Final    Comment: (NOTE) The Xpert Xpress SARS-CoV-2/FLU/RSV plus assay is intended as an aid in the diagnosis of influenza from Nasopharyngeal swab specimens and should not be used as a sole basis for treatment. Nasal washings and aspirates are unacceptable for Xpert  Xpress SARS-CoV-2/FLU/RSV testing.  Fact Sheet for Patients: EntrepreneurPulse.com.au  Fact Sheet for Healthcare Providers: IncredibleEmployment.be  This test is not yet approved or cleared by the [place] FDA and has been authorized for detection and/or diagnosis of SARS-CoV-2 by FDA under an Emergency Use Authorization (EUA). This EUA will remain in effect (meaning this test can be used) for the duration of the COVID-19 declaration under Section 564(b)(1) of the Act, 21 U.S.C. section  360bbb-3(b)(1), unless the authorization is terminated or revoked.  Performed at [facility], [address] [place], [place] [address]      Time coordinating discharge: 45 minutes  SIGNED:   Tawni Millers, MD  [facility] 03/26/2021, 11:35 AM

## 2021-03-26 NOTE — Care Management CC44 (Signed)
Condition Code 44 Documentation Completed  Patient Details  Name: Douglas Cox MRN: [id number] Date of Birth: [birth year]   Condition Code 44 given:  Yes Patient signature on Condition Code 44 notice:  Yes Documentation of 2 MD's agreement:  Yes Code 44 added to claim:  Yes    Dawayne Patricia, RN 03/26/2021, 12:05 PM

## 2021-03-26 NOTE — Progress Notes (Addendum)
Pt insists on leaving stating that we are "doing nothing".  Dr. Cathlean Sauer notified and at bedside.  Will receive orders for discharge and pt will f/u with [facility] as outpatient.  Telemetry and IV removed, CCMD notified.  Pt wife should be arriving any moment and will be waiting in [place] area when we have pt ready to go.

## 2021-03-26 NOTE — Progress Notes (Signed)
Pt is irritated that he hasn't seen a doctor yet this morning and he wants to know what his plan of care is.  MD messaged and states he will be around to see him soon.

## 2021-03-26 NOTE — Care Management Obs Status (Signed)
[place] NOTIFICATION   Patient Details  Name: Terryl Molinelli MRN: [id number] Date of Birth: Aug 31, 1950   Medicare Observation Status Notification Given:  Yes    Dawayne Patricia, RN 03/26/2021, 12:05 PM

## 2021-04-07 ENCOUNTER — Encounter: Payer: Medicare Other | Admitting: Thoracic Surgery (Cardiothoracic Vascular Surgery)

## 2021-04-07 NOTE — Progress Notes (Signed)
[address] VillageSuite 411       [place] [address]             [phone]                    [facility] Medical Record #[id number] Date of Birth: September 28, 1950  Referring: Tawni Millers* Primary Care: Lorene Dy, MD Primary Cardiologist: None  Chief Complaint:    Chief Complaint  Patient presents with   Mediastinal Mass    Surgical consult, Chest/ABD/Pelvis CT 03/25/21    History of Present Illness:    Douglas Cox 70 y.o. adult is referred for evaluation of a mediastinal mass that was found incidentally on cross-sectional imaging.  In August she presented to the ED with unspecified abdominal pain, and the CT scan showed a 5.9 cm anterior mediastinal mass, and diffuse esophageal thickening.  He denies any dysphagia, but states that he underwent esophageal dilation several years ago when [place].  In regards to his mediastinal mass, he denies any neurologic symptoms.  He denies any shortness of breath or visual symptoms.  His weight has been stable, and denies any fevers or night sweats.      Past Medical History:  Diagnosis Date   Celiac disease     Past Surgical History:  Procedure Laterality Date   CATARACT EXTRACTION Bilateral    Dr. Valetta Close   FRACTURE SURGERY     HERNIA REPAIR      No family history on file.   Social History   Tobacco Use  Smoking Status Former   Types: Cigarettes   Quit date: 01/30/2006   Years since quitting: 15.2  Smokeless Tobacco Never    Social History   Substance and Sexual Activity  Alcohol Use Yes   Alcohol/week: 4.0 - 5.0 standard drinks   Types: 4 - 5 Shots of liquor per week   Comment: 3 glasses of whiskey per night.     Allergies  Allergen Reactions   Codone [Hydrocodone] Hives   Clarithromycin Anxiety    Current Outpatient Medications  Medication Sig Dispense Refill   acidophilus (RISAQUAD) CAPS capsule Take 1 capsule by mouth daily. Align once  daily     cholecalciferol (VITAMIN D3) 25 MCG (1000 UNIT) tablet Take 1,000 Units by mouth daily.     Cyanocobalamin 1000 MCG/ML KIT Inject 1 mL as directed. Every 2 weeks     psyllium (METAMUCIL) 58.6 % powder Take 1 packet by mouth daily.     No current facility-administered medications for this visit.    Review of Systems  Constitutional: Negative.   Eyes:  Negative for blurred vision and double vision.  Respiratory: Negative.    Cardiovascular: Negative.   Gastrointestinal:  Positive for abdominal pain.  Musculoskeletal: Negative.   Neurological: Negative.    PHYSICAL EXAMINATION: BP 135/74   Pulse (!) 56   Resp 20   Ht 5' 10" (1.778 m)   Wt 155 lb (70.3 kg)   SpO2 99% Comment: RA  BMI 22.24 kg/m   Physical Exam Constitutional:      General: She is not in acute distress.    Appearance: Normal appearance. She is normal weight. She is not ill-appearing.  HENT:     Head: Normocephalic and atraumatic.  Eyes:     Extraocular Movements: Extraocular movements intact.  Cardiovascular:     Rate and Rhythm: Bradycardia present.     Heart sounds: No murmur heard.  Pulmonary:     Effort: Pulmonary effort is normal. No respiratory distress.  Musculoskeletal:        General: Normal range of motion.     Cervical back: Normal range of motion.  Skin:    General: Skin is warm and dry.  Neurological:     General: No focal deficit present.     Mental Status: She is alert and oriented to person, place, and time.     Diagnostic Studies & Laboratory data:     Recent Radiology Findings:   DG Chest Portable 1 View  Result Date: 03/25/2021 CLINICAL DATA:  Chest pain beginning this morning EXAM: PORTABLE CHEST 1 VIEW COMPARISON:  02/20/2019 FINDINGS: Cardiomediastinal silhouette and pulmonary vasculature are within normal limits. Lungs are hyperexpanded, but otherwise clear clear. IMPRESSION: No acute cardiopulmonary process. Electronically Signed   By: Miachel Roux M.D.   On:  03/25/2021 10:02   CT Angio Chest/Abd/Pel for Dissection W and/or W/WO  Result Date: 03/25/2021 CLINICAL DATA:  Abdominal and chest pain radiating to the back. Concern for dissection. EXAM: CT ANGIOGRAPHY CHEST, ABDOMEN AND PELVIS TECHNIQUE: Non-contrast CT of the chest was initially obtained. Multidetector CT imaging through the chest, abdomen and pelvis was performed using the standard protocol during bolus administration of intravenous contrast. Multiplanar reconstructed images and MIPs were obtained and reviewed to evaluate the vascular anatomy. CONTRAST:  69m OMNIPAQUE IOHEXOL 350 MG/ML SOLN COMPARISON:  MRI abdomen 03/27/2019 FINDINGS: CTA CHEST FINDINGS Cardiovascular: No aortic intramural hematoma identified on precontrast images. No aortic dissection or aneurysm. Heart size is within normal limits. Mediastinum/Nodes: 5.9 x 2.5 x 2.0 cm ovoid mass noted in the anterior mediastinum. No additional enlarged hilar, axillary, or mediastinal lymph nodes are identified. Lungs/Pleura: Lungs are clear. No pleural effusion or pneumothorax. Musculoskeletal: No chest wall abnormality. No acute or significant osseous findings. Review of the MIP images confirms the above findings. CTA ABDOMEN AND PELVIS FINDINGS VASCULAR Aorta: Scattered calcified atheromatous plaque without aneurysm. Celiac: Patent without evidence of aneurysm, dissection, vasculitis or significant stenosis. SMA: Patent without evidence of aneurysm, dissection, vasculitis or significant stenosis. Renals: Both renal arteries are patent without evidence of aneurysm, dissection, vasculitis, fibromuscular dysplasia or significant stenosis. IMA: Patent without evidence of aneurysm, dissection, vasculitis or significant stenosis. Inflow: Patent without evidence of aneurysm, dissection, vasculitis or significant stenosis. Veins: No obvious venous abnormality within the limitations of this arterial phase study. Review of the MIP images confirms the above  findings. NON-VASCULAR Hepatobiliary: No focal liver abnormality is seen. No gallstones, gallbladder wall thickening, or biliary dilatation. Pancreas: Unremarkable. No pancreatic ductal dilatation or surrounding inflammatory changes. Spleen: Normal in size without focal abnormality. Adrenals/Urinary Tract: Adrenal glands are normal. Lobulated low-density structures in the left renal hilum most likely due to renal sinus cysts. Kidneys and ureters otherwise unremarkable. Soft tissue thickening extending from the anterior bladder dome towards the umbilicus suggestive of urachal remnant tissue. Bladder otherwise normal. Stomach/Bowel: Mild diffuse thickening of the esophageal wall. No significant abnormality of the stomach. Diverticulosis of the sigmoid colon without evidence of acute diverticulitis. The appendix is normal. Lymphatic: No enlarged abdominal or pelvic lymph nodes identified. Reproductive: Mildly enlarged prostate. Other: Left inguinal hernia repair changes are seen. Musculoskeletal: No acute or significant osseous findings. Review of the MIP images confirms the above findings. IMPRESSION: 1. No acute abnormality of the chest, abdomen, or pelvis. 2. No aortic dissection. 3. Anterior mediastinal mass measuring 5.9 x 2.5 x 2.0 cm, most likely related to abnormally enlarged lymph node  or thymoma. Cardiothoracic surgery consultation should be considered. 4. Diffuse thickening of the esophageal wall may be due to under distension, however esophagitis can have a similar appearance. Electronically Signed   By: Miachel Roux M.D.   On: 03/25/2021 10:52       I have independently reviewed the above radiology studies  and reviewed the findings with the patient.   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  I personally reviewed his cross-sectional imaging, and the mediastinal mass is round with smooth borders.  There is no evidence of any mass-effect or invasion of surrounding structures.  I have ordered tumor markers along with a PET/CT.  In regards to the esophageal wall thickening I have ordered a swallow study, and will refer him to Dr. Silverio Decamp for possible EGD.   I will see him in follow-up after tumor markers and PET CT have been performed.      Lajuana Matte 04/10/2021 12:33 PM

## 2021-04-10 ENCOUNTER — Other Ambulatory Visit: Payer: Self-pay

## 2021-04-10 ENCOUNTER — Institutional Professional Consult (permissible substitution): Payer: Medicare Other | Admitting: Thoracic Surgery (Cardiothoracic Vascular Surgery)

## 2021-04-10 VITALS — BP 135/74 | HR 56 | Resp 20 | Ht 70.0 in | Wt 155.0 lb

## 2021-04-10 DIAGNOSIS — J9859 Other diseases of mediastinum, not elsewhere classified: Secondary | ICD-10-CM | POA: Diagnosis not present

## 2021-04-13 ENCOUNTER — Other Ambulatory Visit: Payer: Self-pay | Admitting: Thoracic Surgery (Cardiothoracic Vascular Surgery)

## 2021-04-13 ENCOUNTER — Ambulatory Visit
Admission: RE | Admit: 2021-04-13 | Discharge: 2021-04-13 | Disposition: A | Discharge status: Home / Self Care | Payer: Medicare Other | Source: Ambulatory Visit | Attending: Thoracic Surgery (Cardiothoracic Vascular Surgery) | Admitting: Thoracic Surgery (Cardiothoracic Vascular Surgery)

## 2021-04-13 DIAGNOSIS — J9859 Other diseases of mediastinum, not elsewhere classified: Secondary | ICD-10-CM

## 2021-04-14 LAB — LACTATE DEHYDROGENASE: LDH: 135 U/L (ref 120–250)

## 2021-04-14 LAB — AFP TUMOR MARKER: AFP-Tumor Marker: 8 ng/mL — ABNORMAL HIGH (ref <6.1)

## 2021-04-14 LAB — HCG, TOTAL, QUANTITATIVE: hCG, Beta Chain, Quant, S: <3 m[IU]/mL (ref <5)

## 2021-04-24 ENCOUNTER — Telehealth: Payer: Medicare Other | Admitting: Thoracic Surgery (Cardiothoracic Vascular Surgery)

## 2021-04-24 ENCOUNTER — Encounter (HOSPITAL_COMMUNITY)
Admission: RE | Admit: 2021-04-24 | Discharge: 2021-04-24 | Disposition: A | Discharge status: Home / Self Care | Payer: Medicare Other | Source: Ambulatory Visit | Attending: Thoracic Surgery (Cardiothoracic Vascular Surgery) | Admitting: Thoracic Surgery (Cardiothoracic Vascular Surgery)

## 2021-04-24 ENCOUNTER — Other Ambulatory Visit: Payer: Self-pay

## 2021-04-24 DIAGNOSIS — J320 Chronic maxillary sinusitis: Secondary | ICD-10-CM | POA: Insufficient documentation

## 2021-04-24 DIAGNOSIS — K5730 Diverticulosis of large intestine without perforation or abscess without bleeding: Secondary | ICD-10-CM | POA: Insufficient documentation

## 2021-04-24 DIAGNOSIS — J9859 Other diseases of mediastinum, not elsewhere classified: Secondary | ICD-10-CM | POA: Diagnosis present

## 2021-04-24 DIAGNOSIS — I2510 Atherosclerotic heart disease of native coronary artery without angina pectoris: Secondary | ICD-10-CM | POA: Insufficient documentation

## 2021-04-24 LAB — GLUCOSE, CAPILLARY: Glucose-Capillary: 95 mg/dL (ref 70–99)

## 2021-04-24 MED ORDER — FLUDEOXYGLUCOSE F - 18 (FDG) INJECTION
7.7000 | Freq: Once | INTRAVENOUS | Status: AC
  Administered 2021-04-24: 8 via INTRAVENOUS

## 2021-04-28 ENCOUNTER — Encounter (INDEPENDENT_AMBULATORY_CARE_PROVIDER_SITE_OTHER): Payer: Self-pay | Admitting: Ophthalmology

## 2021-04-28 ENCOUNTER — Encounter: Payer: Self-pay | Admitting: Gastroenterology

## 2021-04-28 ENCOUNTER — Other Ambulatory Visit: Payer: Medicare Other

## 2021-04-28 ENCOUNTER — Ambulatory Visit (INDEPENDENT_AMBULATORY_CARE_PROVIDER_SITE_OTHER): Payer: Medicare Other | Admitting: Ophthalmology

## 2021-04-28 ENCOUNTER — Ambulatory Visit: Payer: Medicare Other | Admitting: Gastroenterology

## 2021-04-28 ENCOUNTER — Other Ambulatory Visit: Payer: Self-pay

## 2021-04-28 VITALS — BP 110/62 | HR 57 | Ht 70.0 in | Wt 154.0 lb

## 2021-04-28 DIAGNOSIS — K900 Celiac disease: Secondary | ICD-10-CM

## 2021-04-28 DIAGNOSIS — H3321 Serous retinal detachment, right eye: Secondary | ICD-10-CM

## 2021-04-28 DIAGNOSIS — H43812 Vitreous degeneration, left eye: Secondary | ICD-10-CM | POA: Diagnosis not present

## 2021-04-28 DIAGNOSIS — R9389 Abnormal findings on diagnostic imaging of other specified body structures: Secondary | ICD-10-CM

## 2021-04-28 DIAGNOSIS — H3581 Retinal edema: Secondary | ICD-10-CM

## 2021-04-28 DIAGNOSIS — Z961 Presence of intraocular lens: Secondary | ICD-10-CM | POA: Diagnosis not present

## 2021-04-28 DIAGNOSIS — K649 Unspecified hemorrhoids: Secondary | ICD-10-CM

## 2021-04-28 MED ORDER — MAXITROL 3.5-10000-0.1 OP OINT: 1.0000 "application " | TOPICAL_OINTMENT | Freq: Four times a day (QID) | OPHTHALMIC | 0 refills | Status: AC

## 2021-04-28 NOTE — Progress Notes (Signed)
[facility] Note  04/28/2021     CHIEF COMPLAINT Patient presents for Retina Follow Up  HISTORY OF PRESENT ILLNESS: Douglas Cox is a 70 y.o. male who presents to the clinic today for:   HPI     Retina Follow Up   Patient presents with  Retinal Break/Detachment.  In right eye.  Severity is severe.  Duration of 2 days.  Since onset it is gradually worsening.  I, the attending physician,  performed the HPI with the patient and updated documentation appropriately.        Comments   Pt here for ret f/u for FOL and loss of peripheral vision OD. Pt states FOL started 2 days ago. Unsure when peripheral loss in OD occurred. Reports he feels the PVD has extended to OD. Reports vision otherwise is unchanged. Still seeing floater in OS as well.       Last edited by Bernarda Caffey, MD on 04/28/2021  4:22 PM.    pt states for the past 2 days he has noticed a change in his right eye peripheral vision, he states he first started noticing them on Sunday and gradually, he lost his temporal peripheral vision, pt denies eye trauma  Referring physician: Lorene Dy, MD [address] [place],  [place] [address]  HISTORICAL INFORMATION:   Selected notes from the [facility] for eval of increased floaters/veil OS   CURRENT MEDICATIONS: Current Outpatient Medications (Ophthalmic Drugs)  Medication Sig   neomycin-polymyxin-dexameth (MAXITROL) 0.1 % OINT Place 1 application into the right eye 4 (four) times daily for 14 days.   No current facility-administered medications for this visit. (Ophthalmic Drugs)   Current Outpatient Medications (Other)  Medication Sig   acidophilus (RISAQUAD) CAPS capsule Take 1 capsule by mouth daily. Align once daily   cholecalciferol (VITAMIN D3) 25 MCG (1000 UNIT) tablet Take 1,000 Units by mouth daily.   Cyanocobalamin 1000 MCG/ML KIT Inject 1 mL as directed. Every 2 weeks   psyllium (METAMUCIL) 58.6  % powder Take 1 packet by mouth daily.   No current facility-administered medications for this visit. (Other)   REVIEW OF SYSTEMS: ROS   Positive for: Eyes Negative for: Constitutional, Gastrointestinal, Neurological, Skin, Genitourinary, Musculoskeletal, HENT, Endocrine, Cardiovascular, Respiratory, Psychiatric, Allergic/Imm, Heme/Lymph Last edited by Kingsley Spittle, COT on 04/28/2021  1:05 PM.     ALLERGIES Allergies  Allergen Reactions   Codone [Hydrocodone] Hives   Clarithromycin Anxiety    PAST MEDICAL HISTORY Past Medical History:  Diagnosis Date   Celiac disease    Past Surgical History:  Procedure Laterality Date   CATARACT EXTRACTION Bilateral    Dr. Valetta Close   FRACTURE SURGERY     HERNIA REPAIR      FAMILY HISTORY History reviewed. No pertinent family history.  SOCIAL HISTORY Social History   Tobacco Use   Smoking status: Former    Types: Cigarettes    Quit date: 01/30/2006    Years since quitting: 15.2   Smokeless tobacco: Never  Vaping Use   Vaping Use: Never used  Substance Use Topics   Alcohol use: Yes    Alcohol/week: 4.0 - 5.0 standard drinks    Types: 4 - 5 Shots of liquor per week    Comment: 3 glasses of whiskey per night.   Drug use: Yes    Frequency: 7.0 times per week    Types: Marijuana       OPHTHALMIC EXAM:  Base Eye Exam  Visual Acuity (Snellen - Linear)       Right Left   Dist cc 20/20 20/20    Correction: Glasses         Tonometry (Tonopen, 1:12 PM)       Right Left   Pressure 14 19         Pupils       Dark Light Shape React APD   Right 4 3 Round Brisk None   Left 4 3 Round Brisk None         Visual Fields (Counting fingers)       Left Right    Full    Restrictions  Partial outer inferior temporal deficiency         Extraocular Movement       Right Left    Full, Ortho Full, Ortho         Neuro/Psych     Oriented x3: Yes   Mood/Affect: Normal         Dilation     Both eyes:  1.0% Mydriacyl, 2.5% Phenylephrine @ 1:13 PM           Slit Lamp and Fundus Exam     Slit Lamp Exam       Right Left   Lids/Lashes Dermatochalasis - upper lid Dermatochalasis - upper lid, mild Meibomian gland dysfunction   Conjunctiva/Sclera White and quiet White and quiet   Cornea arcus, well healed temporal cataract wounds, trace PEE arcus, well healed temporal cataract wounds   Anterior Chamber Deep and quiet Deep and quiet   Iris Round and dilated Round and dilated   Lens PC IOL in good position, trace Posterior capsular opacification PC IOL in good position, trace Posterior capsular opacification   Vitreous Vitreous syneresis, prominent vitreous condensations settled inferiorly, +pigment, Posterior vitreous detachment Vitreous syneresis, no pigment, Posterior vitreous detachment, Weiss ring, prominent linear vitreous condensation in IN quad         Fundus Exam       Right Left   Disc Pink and Sharp, Compact Pink and Sharp, Compact, mild tilt   C/D Ratio 0.1 0.2   Macula Flat, Blunted foveal reflex, trace ERM, mild RPE mottling, No heme or edema, shallow SRF just within IT arcades Flat, Blunted foveal reflex, mild RPE mottling, No heme or edema   Vessels mild attenuation, mild tortuousity mild attenuation, mild tortuousity   Periphery Bullous nasal RD from 1230-0630 with small HST at 0130 Attached, mild pigmented paving stone degeneration IT quad, No heme , No RT/RD           Refraction     Wearing Rx       Sphere Cylinder Axis Add   Right +0.25 Sphere  +2.50   Left -0.50 +0.75 117 +2.50         Manifest Refraction       Sphere Cylinder Axis Dist VA   Right +0.25 Sphere  20/20   Left -0.50 +0.75 115 20/20            IMAGING AND PROCEDURES  Imaging and Procedures for 04/28/2021  OCT, Retina - OU - Both Eyes       Right Eye Quality was good. Central Foveal Thickness: 287. Progression has worsened. Findings include normal foveal contour, no IRF,  vitreomacular adhesion , subretinal fluid (+SRF nasal and inferior to disc).   Left Eye Quality was good. Central Foveal Thickness: 269. Progression has been stable. Findings include normal foveal contour, no IRF, no SRF (  Mild vitreous opacities).   Notes *Images captured and stored on drive  Diagnosis / Impression:  OD: NFP, no IRF; +SRF nasal and inferior to disc caught on widefield OS: NFP, no IRF/SRF  Clinical management:  See below  Abbreviations: NFP - Normal foveal profile. CME - cystoid macular edema. PED - pigment epithelial detachment. IRF - intraretinal fluid. SRF - subretinal fluid. EZ - ellipsoid zone. ERM - epiretinal membrane. ORA - outer retinal atrophy. ORT - outer retinal tubulation. SRHM - subretinal hyper-reflective material. IRHM - intraretinal hyper-reflective material      Pneumatic Retinopexy - OD - Right Eye       PROCEDURE NOTE  Diagnosis:  Retinal detachment with retinal tear, RIGHT EYE  Procedure:  Pneumatic cryopexy with C3F8 gas injection, RIGHT EYE  CPT:  33383  Surgeon: Bernarda Caffey, M.D.,Ph.D.  Anesthesia:  Subconjunctival Lidocaine   The patient was brought to the procedure room. Informed consent was obtained for cryopexy of tear and intravitreal gas injection. The diagnosis was reviewed with the patient and all questions were answered. The risks of the procedure including potential systemic risks like stroke and heart attack and other thrombotic events as well as the local risks like infection, endophthalmitis, retinal detachment were discussed. The patient consented to the procedure.   The RIGHT eye was marked and a time out was performed identifying the correct eye. Subsequently, the patient was placed in the supine position. Topical anesthetic drops were given and a lid speculum was placed to expose the eye. Subsequently, 2% Lidocaine was injected subconjunctivally in the superior and nasal quadrants giving adequate anesthesia. Using indirect  ophthalmoscopy the cryo probe was positioned beneath the tear at 0130 and choroidal and retinal whitening was achieved 360 degrees around the tear margins.The superotemporal quadrant was then cleaned with Betadine swabs and allowed to dry. At this time, 3.5 mm posterior to the limbus utilizing a 30-gauge needle, 0.45 cc of pure, 100% C3F8 gas was injected into the vitreous cavity under direct visualization. The needle was then withdrawn from the eye and the retina and gas bubble were visualized. An AC tap was performed to normalize the pressure and the central retinal artery was noted to be patent. Betadine was then reapplied to the injection sites and then rinsed with sterile BSS. A drop of polymixin ophthalmic soln and polysporin opthalmic ung were applied to the eye, then the eye was double patched with eye pads. An arrow was drawn on the dressing to assist with post-operative positioning. There were no complications. Discharge and post-operative instructions were reviewed.            ASSESSMENT/PLAN:    ICD-10-CM   1. Right retinal detachment  H33.21 Pneumatic Retinopexy - OD - Right Eye    2. Retinal edema  H35.81 OCT, Retina - OU - Both Eyes    3. Posterior vitreous detachment of left eye  H43.812     4. Pseudophakia, both eyes  Z96.1      1,2. 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   - Potential treatment options including delimiting laser, pneumatic retinopexy, scleral buckle, and vitrectomy, cryotherapy and laser, and the use of air, gas, and oil discussed with patient. - The risks of blindness, loss of vision, infection, hemorrhage, cataract progression or lens  displacement were discussed with patient. - recommend pneumatic retinopexy OD today,  09.27.22 - pt wishes to proceed with procedure - RBA of procedure discussed, questions answered - informed consent obtained and signed - see procedure note - start Maxitrol ointment OD - reviewed post op positioning and medication instructions with pt and wife - f/u tomorrow at 1245 for POD1OD  3. PVD OS  - acute development of prominent floaters OS -- saw Dr. Katy Fitch on call on Saturday, 10.16.21  - Discussed findings and prognosis  - No RT or RD on 360 scleral depressed exam  - Reviewed s/s of RT/RD  - Strict return precautions for any such RT/RD signs/symptoms  - f/u in 3-4 wks -- DFE/OCT  4. Pseudophakia OU  - s/p CE/IOL OU (Dr. Valetta Close)  - IOLs in good position, doing well  - monitor  Ophthalmic Meds Ordered this visit:  Meds ordered this encounter  Medications   neomycin-polymyxin-dexameth (MAXITROL) 0.1 % OINT    Sig: Place 1 application into the right eye 4 (four) times daily for 14 days.    Dispense:  3.5 g    Refill:  0     Return in about 1 day (around 04/29/2021) for f/u RD OD, DFE, OCT.  There are no Patient Instructions on file for this visit.   Explained the diagnoses, plan, and follow up with the patient and they expressed understanding.  Patient expressed understanding of the importance of proper follow up care.   This document serves as a record of services personally performed by Gardiner Sleeper, MD, PhD. It was created on their behalf by San Jetty. Owens Shark, OA an ophthalmic technician. The creation of this record is the provider's dictation and/or activities during the visit.    Electronically signed by: San Jetty. Owens Shark, [place] 09.27.2022 4:38 PM   Gardiner Sleeper, M.D., Ph.D. Diseases & Surgery of the Retina and Vitreous [facility]  I have reviewed the above documentation for accuracy and completeness, and I agree with the above. Gardiner Sleeper, M.D., Ph.D. 04/28/21 4:38 PM   Abbreviations: M myopia (nearsighted); A astigmatism; H  hyperopia (farsighted); P presbyopia; Mrx spectacle prescription;  CTL contact lenses; OD right eye; OS left eye; OU both eyes  XT exotropia; ET esotropia; PEK punctate epithelial keratitis; PEE punctate epithelial erosions; DES dry eye syndrome; MGD meibomian gland dysfunction; ATs artificial tears; PFAT's preservative free artificial tears; [place] nuclear sclerotic cataract; PSC posterior subcapsular cataract; ERM epi-retinal membrane; PVD posterior vitreous detachment; RD retinal detachment; DM diabetes mellitus; DR diabetic retinopathy; NPDR non-proliferative diabetic retinopathy; PDR proliferative diabetic retinopathy; CSME clinically significant macular edema; DME diabetic macular edema; dbh dot blot hemorrhages; CWS cotton wool spot; POAG primary open angle glaucoma; C/D cup-to-disc ratio; HVF humphrey visual field; GVF goldmann visual field; OCT optical coherence tomography; IOP intraocular pressure; BRVO Branch retinal vein occlusion; CRVO central retinal vein occlusion; CRAO central retinal artery occlusion; BRAO branch retinal artery occlusion; RT retinal tear; SB scleral buckle; PPV pars plana vitrectomy; VH Vitreous hemorrhage; PRP panretinal laser photocoagulation; IVK intravitreal kenalog; VMT vitreomacular traction; MH Macular hole;  NVD neovascularization of the disc; NVE neovascularization elsewhere; AREDS age related eye disease study; ARMD age related macular degeneration; POAG primary open angle glaucoma; EBMD epithelial/anterior basement membrane dystrophy; ACIOL anterior chamber intraocular lens; IOL intraocular lens; PCIOL posterior chamber intraocular lens; Phaco/IOL phacoemulsification with intraocular lens placement; [place] photorefractive keratectomy; LASIK laser assisted in situ keratomileusis; HTN hypertension; DM diabetes mellitus; COPD chronic obstructive  pulmonary disease

## 2021-04-28 NOTE — Patient Instructions (Signed)
Your provider has requested that you go to the basement level for lab work before leaving today. Press "B" on the elevator. The lab is located at the first door on the left as you exit the elevator.   We will try to obtain records from [facility] in [place]  Follow up in 6 months  Due to recent changes in healthcare laws, you may see the results of your imaging and laboratory studies on MyChart before your provider has had a chance to review them.  We understand that in some cases there may be results that are confusing or concerning to you. Not all laboratory results come back in the same time frame and the provider may be waiting for multiple results in order to interpret others.  Please give [place] 48 hours in order for your provider to thoroughly review all the results before contacting the office for clarification of your results.    If you are age 27 or older, your body mass index should be between 23-30. Your Body mass index is 22.1 kg/m. If this is out of the aforementioned range listed, please consider follow up with your Primary Care Provider.  If you are age 71 or younger, your body mass index should be between 19-25. Your Body mass index is 22.1 kg/m. If this is out of the aformentioned range listed, please consider follow up with your Primary Care Provider.   __________________________________________________________  The [facility] providers would like to encourage you to use [facility] to communicate with providers for non-urgent requests or questions.  Due to long hold times on the telephone, sending your provider a message by [facility] may be a faster and more efficient way to get a response.  Please allow 48 business hours for a response.  Please remember that this is for non-urgent requests.    I appreciate the  opportunity to care for you  Thank You   Harl Bowie , MD

## 2021-04-28 NOTE — Progress Notes (Signed)
Douglas Cox    [id number]    [birth year]  Primary Care Physician:Pcp, No  Referring Physician: Lorene Dy, MD [address] [place],  [place] [address]   Chief complaint: Mediastinal mass, esophageal thickening  HPI:  70 year old very pleasant gentleman with history of celiac disease here for new patient visit for follow-up of abnormal findings on CT  He has 2.8 X1.9X 6.5 cm anterior mediastinal lesion, likely complex cyst based on PET/CT 04/24/21.   He had mild diffuse thickening of esophagus on CT angio chest abdomen pelvis in August 2022 but subsequent imaging in September were negative for any mass lesion or esophageal thickening.  No hiatal hernia and ingested barium tablet passed freely through the esophagus. Denies any dysphagia, odynophagia, heartburn, nausea, vomiting, abdominal pain, melena or bright red blood per rectum  He is strictly adhering to gluten-free and lactose-free diet.  Overall he does not feel anything is wrong with him but he feels he is depressed and has not been eating well, was skipping meals and he feels that is contributing to his weight loss.  He has lost over 20 pounds in the past year.  Patient moved here 2 to 3 years ago from [place] EGD and colonoscopy in [place] 5-6 years ago were unremarkable according to patient, reports not available to review during this visit  Outpatient Encounter Medications as of 04/28/2021  Medication Sig   acidophilus (RISAQUAD) CAPS capsule Take 1 capsule by mouth daily. Align once daily   cholecalciferol (VITAMIN D3) 25 MCG (1000 UNIT) tablet Take 1,000 Units by mouth daily.   Cyanocobalamin 1000 MCG/ML KIT Inject 1 mL as directed. Every 2 weeks   psyllium (METAMUCIL) 58.6 % powder Take 1 packet by mouth daily.   No facility-administered encounter medications on file as of 04/28/2021.    Allergies as of 04/28/2021 - Review Complete 04/24/2021  Allergen Reaction Noted   Codone  [hydrocodone] Hives 01/30/2018   Clarithromycin Anxiety 01/30/2018    Past Medical History:  Diagnosis Date   Celiac disease     Past Surgical History:  Procedure Laterality Date   CATARACT EXTRACTION Bilateral    Dr. Valetta Close   FRACTURE SURGERY     HERNIA REPAIR      No family history on file.  Social History   Socioeconomic History   Marital status: Married    Spouse name: Not on file   Number of children: Not on file   Years of education: Not on file   Highest education level: Not on file  Occupational History   Not on file  Tobacco Use   Smoking status: Former    Types: Cigarettes    Quit date: 01/30/2006    Years since quitting: 15.2   Smokeless tobacco: Never  Vaping Use   Vaping Use: Never used  Substance and Sexual Activity   Alcohol use: Yes    Alcohol/week: 4.0 - 5.0 standard drinks    Types: 4 - 5 Shots of liquor per week    Comment: 3 glasses of whiskey per night.   Drug use: Yes    Frequency: 7.0 times per week    Types: Marijuana   Sexual activity: Not Currently  Other Topics Concern   Not on file  Social History Narrative   Not on file   Social Determinants of Health   Financial Resource Strain: Not on file  Food Insecurity: Not on file  Transportation Needs: Not on file  Physical  Activity: Not on file  Stress: Not on file  Social Connections: Not on file  Intimate Partner Violence: Not on file      Review of systems: All other review of systems negative except as mentioned in the HPI.   Physical Exam: Vitals:   04/28/21 1004  BP: 110/62  Pulse: (!) 57   Body mass index is 22.1 kg/m. Gen:      No acute distress HEENT:  sclera anicteric Abd:      soft, non-tender; no palpable masses, no distension Ext:    No edema Neuro: alert and oriented x 3 Psych: normal mood and affect  Data Reviewed:  Reviewed labs, radiology imaging, old records and pertinent past GI work up   Assessment and Plan/Recommendations:  70 year old  very pleasant gentleman with history of celiac disease, significant weight loss, abnormal CT with mediastinal mass  He had diffuse esophageal wall thickening on initial CTA in August 2022 but subsequent barium study and PET/CT negative for any significant abnormality.  Patient wants to hold off scheduling EGD at this point unless it is required preop by Dr. Kipp Brood.  Patient will contact our office if he wants to proceed with EGD  He feels his celiac disease is currently well controlled and he is adhering to strict gluten-free diet Check TTG IgA antibody level to exclude accidental gluten exposure  Hemorrhoids: Use preparation H suppository per rectum as needed Continue with high-fiber diet Will consider hemorrhoidal band ligation if continues to have persistent symptoms  Will try to obtain records of prior GI work-up including EGD and colonoscopy from previous GI  Return in 6 months or sooner if needed   The patient was provided an opportunity to ask questions and all were answered. The patient agreed with the plan and demonstrated an understanding of the instructions.  Damaris Hippo , MD    CC: Lorene Dy, MD

## 2021-04-29 ENCOUNTER — Encounter (INDEPENDENT_AMBULATORY_CARE_PROVIDER_SITE_OTHER): Payer: Self-pay | Admitting: Ophthalmology

## 2021-04-29 ENCOUNTER — Telehealth: Payer: Medicare Other | Admitting: Thoracic Surgery (Cardiothoracic Vascular Surgery)

## 2021-04-29 ENCOUNTER — Ambulatory Visit (INDEPENDENT_AMBULATORY_CARE_PROVIDER_SITE_OTHER): Payer: Medicare Other | Admitting: Ophthalmology

## 2021-04-29 DIAGNOSIS — H3321 Serous retinal detachment, right eye: Secondary | ICD-10-CM

## 2021-04-29 DIAGNOSIS — H3581 Retinal edema: Secondary | ICD-10-CM | POA: Diagnosis not present

## 2021-04-29 DIAGNOSIS — H43812 Vitreous degeneration, left eye: Secondary | ICD-10-CM

## 2021-04-29 DIAGNOSIS — Z961 Presence of intraocular lens: Secondary | ICD-10-CM

## 2021-04-29 LAB — TISSUE TRANSGLUTAMINASE, IGA: (tTG) Ab, IgA: <1 U/mL

## 2021-04-29 NOTE — Progress Notes (Signed)
[facility] Note  04/29/2021     CHIEF COMPLAINT Patient presents for Post-op Follow-up  HISTORY OF PRESENT ILLNESS: Douglas Cox is a 70 y.o. male who presents to the clinic today for:   HPI     Post-op Follow-up   In right eye.  Vision is blurred at distance and is blurred at near.  I, the attending physician,  performed the HPI with the patient and updated documentation appropriately.        Comments   70 y/o male pt here for 1 day POV s/p pneumatic retinopexy for repair of rheg, RD OD.  VA OD slightly blurred, and sees what looks like a "paw print" of bubbles in vision OD.  No change in [place] OS.  Denies pain, FOL, floaters.  Used Maxitrol ung OD once last night; has not used today.      Last edited by Bernarda Caffey, MD on 04/29/2021  4:34 PM.      Referring physician: No referring provider defined for this encounter.  HISTORICAL INFORMATION:   Selected notes from the [facility] for eval of increased floaters/veil OS   CURRENT MEDICATIONS: Current Outpatient Medications (Ophthalmic Drugs)  Medication Sig   neomycin-polymyxin-dexameth (MAXITROL) 0.1 % OINT Place 1 application into the right eye 4 (four) times daily for 14 days.   No current facility-administered medications for this visit. (Ophthalmic Drugs)   Current Outpatient Medications (Other)  Medication Sig   acidophilus (RISAQUAD) CAPS capsule Take 1 capsule by mouth daily. Align once daily   cholecalciferol (VITAMIN D3) 25 MCG (1000 UNIT) tablet Take 1,000 Units by mouth daily.   Cyanocobalamin 1000 MCG/ML KIT Inject 1 mL as directed. Every 2 weeks   psyllium (METAMUCIL) 58.6 % powder Take 1 packet by mouth daily.   No current facility-administered medications for this visit. (Other)   REVIEW OF SYSTEMS: ROS   Positive for: Cardiovascular, Eyes Negative for: Constitutional, Gastrointestinal, Neurological, Skin, Genitourinary, Musculoskeletal,  HENT, Endocrine, Respiratory, Psychiatric, Allergic/Imm, Heme/Lymph Last edited by Matthew Folks, COA on 04/29/2021  2:10 PM.     ALLERGIES Allergies  Allergen Reactions   Codone [Hydrocodone] Hives   Clarithromycin Anxiety    PAST MEDICAL HISTORY Past Medical History:  Diagnosis Date   Celiac disease    Retinal detachment    Past Surgical History:  Procedure Laterality Date   CATARACT EXTRACTION Bilateral    Dr. Valetta Close   EYE SURGERY     FRACTURE SURGERY     HERNIA REPAIR     RETINAL DETACHMENT SURGERY      FAMILY HISTORY History reviewed. No pertinent family history.  SOCIAL HISTORY Social History   Tobacco Use   Smoking status: Former    Types: Cigarettes    Quit date: 01/30/2006    Years since quitting: 15.2   Smokeless tobacco: Never  Vaping Use   Vaping Use: Never used  Substance Use Topics   Alcohol use: Yes    Alcohol/week: 4.0 - 5.0 standard drinks    Types: 4 - 5 Shots of liquor per week    Comment: 3 glasses of whiskey per night.   Drug use: Yes    Frequency: 7.0 times per week    Types: Marijuana       OPHTHALMIC EXAM:  Base Eye Exam     Visual Acuity (Snellen - Linear)       Right Left   Dist cc 20/25 20/20   Dist ph cc NI  Correction: Glasses         Tonometry (Tonopen, 2:13 PM)       Right Left   Pressure 15 Def         Pupils       Dark Light Shape React APD   Right 4 3 Round Brisk None   Left 4 3 Round Brisk None         Visual Fields (Counting fingers)       Left Right    Full Full         Extraocular Movement       Right Left    Full, Ortho Full, Ortho         Neuro/Psych     Oriented x3: Yes   Mood/Affect: Normal         Dilation     Right eye: 1.0% Mydriacyl, 2.5% Phenylephrine @ 2:14 PM           Slit Lamp and Fundus Exam     Slit Lamp Exam       Right Left   Lids/Lashes Dermatochalasis - upper lid Dermatochalasis - upper lid, mild Meibomian gland dysfunction    Conjunctiva/Sclera Subconjunctival hemorrhage, Chemosis White and quiet   Cornea arcus, well healed temporal cataract wounds, trace PEE arcus, well healed temporal cataract wounds   Anterior Chamber Deep and quiet Deep and quiet   Iris Round and dilated Round and dilated   Lens PC IOL in good position, trace Posterior capsular opacification PC IOL in good position, trace Posterior capsular opacification   Vitreous Vitreous syneresis, prominent vitreous condensations settled inferiorly, +pigment, Posterior vitreous detachment; multiple mutliple gas bubbles superiorly about 25% Vitreous syneresis, no pigment, Posterior vitreous detachment, Weiss ring, prominent linear vitreous condensation in IN quad         Fundus Exam       Right Left   Disc Pink and Sharp, Compact Pink and Sharp, Compact, mild tilt   C/D Ratio 0.1 0.2   Macula Flat, Blunted foveal reflex, trace ERM, mild RPE mottling, No heme or edema, shallow SRF just within IT arcades Flat, Blunted foveal reflex, mild RPE mottling, No heme or edema   Vessels mild attenuation, mild tortuousity mild attenuation, mild tortuousity   Periphery Bullous nasal RD from [id number] with small HST at 0130; interval improvement in superonasal SRF -- retina reattaching; residual SRF inferiorly--improved Attached, mild pigmented paving stone degeneration IT quad, No heme , No RT/RD            IMAGING AND PROCEDURES  Imaging and Procedures for 04/29/2021  OCT, Retina - OU - Both Eyes       Right Eye Quality was good. Central Foveal Thickness: 286. Progression has improved. Findings include normal foveal contour, no IRF, vitreomacular adhesion , subretinal fluid (Interval improvement in superonasal SRF -- retina reattached; residual SRF inferior, improved).   Left Eye Quality was good. Central Foveal Thickness: 267. Progression has been stable. Findings include normal foveal contour, no IRF, no SRF (Mild vitreous opacities).   Notes *Images  captured and stored on drive  Diagnosis / Impression:  OD: NFP, no IRF; Interval improvement in superonasal SRF -- retina reattached; residual SRF inferior--improved OS: NFP, no IRF/SRF  Clinical management:  See below  Abbreviations: NFP - Normal foveal profile. CME - cystoid macular edema. PED - pigment epithelial detachment. IRF - intraretinal fluid. SRF - subretinal fluid. EZ - ellipsoid zone. ERM - epiretinal membrane. ORA - outer retinal atrophy. ORT - outer  retinal tubulation. SRHM - subretinal hyper-reflective material. IRHM - intraretinal hyper-reflective material            ASSESSMENT/PLAN:    ICD-10-CM   1. Right retinal detachment  H33.21     2. Retinal edema  H35.81 OCT, Retina - OU - Both Eyes    3. Posterior vitreous detachment of left eye  H43.812     4. Pseudophakia, both eyes  Z96.1      1,2. 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 PVD OS  - acute development of prominent floaters OS -- saw Dr. Katy Fitch on call on Saturday, 10.16.21  - Discussed findings and prognosis  - No RT or RD on 360 scleral depressed exam  - Reviewed s/s of RT/RD  - Strict return precautions for any such RT/RD signs/symptoms  - f/u in 3-4 wks -- DFE/OCT  4. Pseudophakia OU  - s/p CE/IOL OU (Dr. Valetta Close)  - IOLs in good position, doing well  - monitor  Ophthalmic Meds Ordered this visit:  No orders of the defined types were placed in this encounter.     Return for tomorrow at 8am for POD #2 OD.  There are no Patient Instructions on file for this visit.   Explained the diagnoses, plan, and follow up with the patient and they expressed understanding.  Patient expressed understanding of the importance of proper follow up care.   This document serves as a record of services personally performed by Gardiner Sleeper, MD, PhD. It was created on their behalf by Leonie Douglas, an ophthalmic technician. The creation of this record is the provider's dictation and/or activities during the visit.    Electronically signed by: Leonie Douglas COA, 04/29/21  5:03 PM   This document serves as a record of services personally performed by Gardiner Sleeper, MD, PhD. It was created on their behalf by San Jetty. Owens Shark, OA an ophthalmic technician. The creation of this record is the provider's dictation and/or activities during the visit.    Electronically signed by: San Jetty. Owens Shark, [place] 09.28.2022 5:03 PM   Gardiner Sleeper, M.D., Ph.D. Diseases & Surgery of the Retina and [place] 04/29/2021   I have reviewed the above documentation for accuracy and completeness, and I agree with the above. Gardiner Sleeper, M.D., Ph.D. 04/29/21 5:03 PM    Abbreviations: M myopia (nearsighted); A astigmatism; H hyperopia (farsighted); P presbyopia; Mrx spectacle prescription;  CTL contact lenses; OD right eye; OS left eye; OU both eyes  XT exotropia; ET esotropia; PEK punctate epithelial keratitis; PEE punctate epithelial erosions; DES dry eye syndrome; MGD meibomian gland dysfunction; ATs artificial tears; PFAT's preservative free artificial tears; Electra nuclear sclerotic cataract; PSC posterior subcapsular cataract; ERM epi-retinal membrane; PVD posterior vitreous detachment; RD retinal detachment; DM diabetes mellitus; DR diabetic retinopathy; NPDR non-proliferative diabetic retinopathy; PDR proliferative diabetic retinopathy; CSME clinically  significant macular edema; DME diabetic macular edema; dbh dot blot hemorrhages; CWS cotton wool spot; POAG primary  open angle glaucoma; C/D cup-to-disc ratio; HVF humphrey visual field; GVF goldmann visual field; OCT optical coherence tomography; IOP intraocular pressure; BRVO Branch retinal vein occlusion; CRVO central retinal vein occlusion; CRAO central retinal artery occlusion; BRAO branch retinal artery occlusion; RT retinal tear; SB scleral buckle; PPV pars plana vitrectomy; VH Vitreous hemorrhage; PRP panretinal laser photocoagulation; IVK intravitreal kenalog; VMT vitreomacular traction; MH Macular hole;  NVD neovascularization of the disc; NVE neovascularization elsewhere; AREDS age related eye disease study; ARMD age related macular degeneration; POAG primary open angle glaucoma; EBMD epithelial/anterior basement membrane dystrophy; ACIOL anterior chamber intraocular lens; IOL intraocular lens; PCIOL posterior chamber intraocular lens; Phaco/IOL phacoemulsification with intraocular lens placement; Brooks photorefractive keratectomy; LASIK laser assisted in situ keratomileusis; HTN hypertension; DM diabetes mellitus; COPD chronic obstructive pulmonary disease

## 2021-04-30 ENCOUNTER — Other Ambulatory Visit: Payer: Self-pay

## 2021-04-30 ENCOUNTER — Ambulatory Visit (INDEPENDENT_AMBULATORY_CARE_PROVIDER_SITE_OTHER): Payer: Medicare Other | Admitting: Ophthalmology

## 2021-04-30 ENCOUNTER — Encounter (INDEPENDENT_AMBULATORY_CARE_PROVIDER_SITE_OTHER): Payer: Self-pay | Admitting: Ophthalmology

## 2021-04-30 DIAGNOSIS — H3321 Serous retinal detachment, right eye: Secondary | ICD-10-CM | POA: Diagnosis not present

## 2021-04-30 DIAGNOSIS — Z961 Presence of intraocular lens: Secondary | ICD-10-CM

## 2021-04-30 DIAGNOSIS — H43812 Vitreous degeneration, left eye: Secondary | ICD-10-CM

## 2021-04-30 DIAGNOSIS — H3581 Retinal edema: Secondary | ICD-10-CM

## 2021-04-30 NOTE — Progress Notes (Signed)
[facility] Note  04/30/2021     CHIEF COMPLAINT Patient presents for Post-op Follow-up  HISTORY OF PRESENT ILLNESS: Douglas Cox is a 70 y.o. male who presents to the clinic today for:   HPI     Post-op Follow-up   In right eye.  I, the attending physician,  performed the HPI with the patient and updated documentation appropriately.        Comments   2 day post op pneumatic for RD OD-  Doing well.  No change in bubble since yesterday. He stopped Maxitrol after the first night.       Last edited by Bernarda Caffey, MD on 04/30/2021 12:55 PM.    Pt states he is doing okay, he is getting about 7 hours of sleep a night, pt is seeing floaters, he states the gas bubble is still several bubbles, that do not appear to be getting bigger or smaller  Referring physician: No referring provider defined for this encounter.  HISTORICAL INFORMATION:   Selected notes from the [facility] for eval of increased floaters/veil OS   CURRENT MEDICATIONS: Current Outpatient Medications (Ophthalmic Drugs)  Medication Sig   neomycin-polymyxin-dexameth (MAXITROL) 0.1 % OINT Place 1 application into the right eye 4 (four) times daily for 14 days. (Patient not taking: Reported on 04/30/2021)   No current facility-administered medications for this visit. (Ophthalmic Drugs)   Current Outpatient Medications (Other)  Medication Sig   acidophilus (RISAQUAD) CAPS capsule Take 1 capsule by mouth daily. Align once daily   cholecalciferol (VITAMIN D3) 25 MCG (1000 UNIT) tablet Take 1,000 Units by mouth daily.   Cyanocobalamin 1000 MCG/ML KIT Inject 1 mL as directed. Every 2 weeks   psyllium (METAMUCIL) 58.6 % powder Take 1 packet by mouth daily.   No current facility-administered medications for this visit. (Other)   REVIEW OF SYSTEMS: ROS   Positive for: Cardiovascular, Eyes Negative for: Constitutional, Gastrointestinal, Neurological, Skin,  Genitourinary, Musculoskeletal, HENT, Endocrine, Respiratory, Psychiatric, Allergic/Imm, Heme/Lymph Last edited by Leonie Douglas, COA on 04/30/2021  8:09 AM.     ALLERGIES Allergies  Allergen Reactions   Codone [Hydrocodone] Hives   Clarithromycin Anxiety    PAST MEDICAL HISTORY Past Medical History:  Diagnosis Date   Celiac disease    Retinal detachment    Past Surgical History:  Procedure Laterality Date   CATARACT EXTRACTION Bilateral    Dr. Valetta Close   EYE SURGERY     FRACTURE SURGERY     HERNIA REPAIR     RETINAL DETACHMENT SURGERY      FAMILY HISTORY History reviewed. No pertinent family history.  SOCIAL HISTORY Social History   Tobacco Use   Smoking status: Former    Types: Cigarettes    Quit date: 01/30/2006    Years since quitting: 15.2   Smokeless tobacco: Never  Vaping Use   Vaping Use: Never used  Substance Use Topics   Alcohol use: Yes    Alcohol/week: 4.0 - 5.0 standard drinks    Types: 4 - 5 Shots of liquor per week    Comment: 3 glasses of whiskey per night.   Drug use: Yes    Frequency: 7.0 times per week    Types: Marijuana       OPHTHALMIC EXAM:  Base Eye Exam     Visual Acuity (Snellen - Linear)       Right Left   Dist [place] 20/25 20/20  Tonometry (Tonopen, 8:12 AM)       Right Left   Pressure 14 def         Pupils       Dark Light Shape React APD   Right 4 3 Round Brisk None   Left 4 3 Round Brisk None         Visual Fields       Left Right    Full Full         Extraocular Movement       Right Left    Full Full         Neuro/Psych     Oriented x3: Yes   Mood/Affect: Normal         Dilation     Right eye: 1.0% Mydriacyl, 2.5% Phenylephrine @ 8:13 AM           Slit Lamp and Fundus Exam     Slit Lamp Exam       Right Left   Lids/Lashes Dermatochalasis - upper lid Dermatochalasis - upper lid, mild Meibomian gland dysfunction   Conjunctiva/Sclera Subconjunctival hemorrhage - improving,  pneumatic Chemosis White and quiet   Cornea arcus, well healed temporal cataract wounds, trace PEE arcus, well healed temporal cataract wounds   Anterior Chamber Deep and quiet Deep and quiet   Iris Round and dilated Round and dilated   Lens PC IOL in good position, trace Posterior capsular opacification PC IOL in good position, trace Posterior capsular opacification   Vitreous Vitreous syneresis, prominent vitreous condensations settled inferiorly, +pigment, Posterior vitreous detachment; mutliple gas bubbles superiorly about 25%  Vitreous syneresis, no pigment, Posterior vitreous detachment, Weiss ring, prominent linear vitreous condensation in IN quad         Fundus Exam       Right Left   Disc Pink and Sharp, Compact Pink and Sharp, Compact, mild tilt   C/D Ratio 0.1 0.2   Macula Flat, Blunted foveal reflex, trace ERM, mild RPE mottling, No heme or edema, shallow SRF just within IT arcades Flat, Blunted foveal reflex, mild RPE mottling, No heme or edema   Vessels mild attenuation, mild tortuousity mild attenuation, mild tortuousity   Periphery Bullous nasal RD from 4315-4008 with small HST at 0130; interval improvement in superonasal SRF -- retina reattaching; residual SRF inferiorly--improving, good early cryo changes around 0130 tear Attached, mild pigmented paving stone degeneration IT quad, No heme , No RT/RD            IMAGING AND PROCEDURES  Imaging and Procedures for 04/30/2021  OCT, Retina - OU - Both Eyes       Right Eye Quality was good. Central Foveal Thickness: 286. Progression has improved. Findings include normal foveal contour, no IRF, vitreomacular adhesion , subretinal fluid (Interval improvement in superonasal SRF -- retina reattaching; residual SRF inferior, improving).   Left Eye Quality was good. Central Foveal Thickness: 264. Progression has been stable. Findings include normal foveal contour, no IRF, no SRF (Mild vitreous opacities).   Notes *Images  captured and stored on drive  Diagnosis / Impression:  OD: NFP, no IRF; Interval improvement in superonasal SRF -- retina reattaching; residual SRF inferior, improving OS: NFP, no IRF/SRF  Clinical management:  See below  Abbreviations: NFP - Normal foveal profile. CME - cystoid macular edema. PED - pigment epithelial detachment. IRF - intraretinal fluid. SRF - subretinal fluid. EZ - ellipsoid zone. ERM - epiretinal membrane. ORA - outer retinal atrophy. ORT - outer retinal tubulation. SRHM -  subretinal hyper-reflective material. IRHM - intraretinal hyper-reflective material            ASSESSMENT/PLAN:    ICD-10-CM   1. Right retinal detachment  H33.21     2. Retinal edema  H35.81 OCT, Retina - OU - Both Eyes    3. Posterior vitreous detachment of left eye  H43.812     4. Pseudophakia, both eyes  Z96.1      1,2. 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 PVD OS  - acute development of prominent floaters OS -- saw Dr. Katy Fitch on call on Saturday, 10.16.21  - Discussed findings and prognosis  - No RT or RD on 360 scleral depressed exam  - Reviewed s/s of RT/RD  - Strict return precautions for any such RT/RD signs/symptoms  - f/u in 3-4 wks -- DFE/OCT  4. Pseudophakia OU  - s/p CE/IOL OU (Dr. Valetta Close)  - IOLs in good position, doing well  - monitor  Ophthalmic Meds Ordered this visit:  No orders of the defined types were placed in this encounter.     Return in about 1 day (around 05/01/2021) for f/u RD OD, DFE, OCT.  There are no Patient Instructions on file for this visit.   Explained the diagnoses, plan, and follow up with the patient and they expressed understanding.  Patient expressed understanding of the importance of proper follow up care.    This document serves as a record of services personally performed by Gardiner Sleeper, MD, PhD. It was created on their behalf by San Jetty. Owens Shark, OA an ophthalmic technician. The creation of this record is the provider's dictation and/or activities during the visit.    Electronically signed by: San Jetty. Owens Shark, [place] 09.29.2022 12:58 PM  Gardiner Sleeper, M.D., Ph.D. Diseases & Surgery of the Retina and Vitreous [facility]  I have reviewed the above documentation for accuracy and completeness, and I agree with the above. Gardiner Sleeper, M.D., Ph.D. 04/30/21 1:01 PM    Abbreviations: M myopia (nearsighted); A astigmatism; H hyperopia (farsighted); P presbyopia; Mrx spectacle prescription;  CTL contact lenses; OD right eye; OS left eye; OU both eyes  XT exotropia; ET esotropia; PEK punctate epithelial keratitis; PEE punctate epithelial erosions; DES dry eye syndrome; MGD meibomian gland dysfunction; ATs artificial tears; PFAT's preservative free artificial tears; [place] nuclear sclerotic cataract; PSC posterior subcapsular cataract; ERM epi-retinal membrane; PVD posterior vitreous detachment; RD retinal detachment; DM diabetes mellitus; DR diabetic retinopathy; NPDR non-proliferative diabetic retinopathy; PDR proliferative diabetic retinopathy; CSME clinically significant macular edema; DME diabetic macular edema; dbh dot blot hemorrhages; CWS cotton wool spot; POAG primary open angle glaucoma; C/D cup-to-disc ratio; HVF humphrey visual field; GVF goldmann visual field; OCT optical coherence tomography; IOP intraocular pressure; BRVO Branch retinal vein occlusion; CRVO central  retinal vein occlusion; CRAO central retinal artery occlusion; BRAO branch retinal artery occlusion; RT retinal tear; SB scleral buckle; PPV pars plana vitrectomy; VH Vitreous hemorrhage; PRP panretinal laser photocoagulation; IVK intravitreal  kenalog; VMT vitreomacular traction; MH Macular hole;  NVD neovascularization of the disc; NVE neovascularization elsewhere; AREDS age related eye disease study; ARMD age related macular degeneration; POAG primary open angle glaucoma; EBMD epithelial/anterior basement membrane dystrophy; ACIOL anterior chamber intraocular lens; IOL intraocular lens; PCIOL posterior chamber intraocular lens; Phaco/IOL phacoemulsification with intraocular lens placement; [place] photorefractive keratectomy; LASIK laser assisted in situ keratomileusis; HTN hypertension; DM diabetes mellitus; COPD chronic obstructive pulmonary disease

## 2021-05-01 ENCOUNTER — Ambulatory Visit (INDEPENDENT_AMBULATORY_CARE_PROVIDER_SITE_OTHER): Payer: Medicare Other | Admitting: Thoracic Surgery (Cardiothoracic Vascular Surgery)

## 2021-05-01 ENCOUNTER — Ambulatory Visit (INDEPENDENT_AMBULATORY_CARE_PROVIDER_SITE_OTHER): Payer: Medicare Other | Admitting: Ophthalmology

## 2021-05-01 ENCOUNTER — Encounter (INDEPENDENT_AMBULATORY_CARE_PROVIDER_SITE_OTHER): Payer: Self-pay | Admitting: Ophthalmology

## 2021-05-01 ENCOUNTER — Other Ambulatory Visit: Payer: Self-pay | Admitting: *Deleted

## 2021-05-01 DIAGNOSIS — J9859 Other diseases of mediastinum, not elsewhere classified: Secondary | ICD-10-CM | POA: Diagnosis not present

## 2021-05-01 DIAGNOSIS — H43812 Vitreous degeneration, left eye: Secondary | ICD-10-CM | POA: Diagnosis not present

## 2021-05-01 DIAGNOSIS — H3321 Serous retinal detachment, right eye: Secondary | ICD-10-CM | POA: Diagnosis not present

## 2021-05-01 DIAGNOSIS — H3581 Retinal edema: Secondary | ICD-10-CM

## 2021-05-01 DIAGNOSIS — Z961 Presence of intraocular lens: Secondary | ICD-10-CM

## 2021-05-01 NOTE — Progress Notes (Signed)
[facility] Note  05/01/2021     CHIEF COMPLAINT Patient presents for Retina Follow Up  HISTORY OF PRESENT ILLNESS: Douglas Cox is a 70 y.o. male who presents to the clinic today for:   HPI     Retina Follow Up   Patient presents with  Retinal Break/Detachment.  In right eye.  Severity is moderate.  Duration of 1 day.  Since onset it is stable.  I, the attending physician,  performed the HPI with the patient and updated documentation appropriately.        Comments   Pt here for 1 day ret exam for RD OD. Pt states gas bubble might be getting smaller, seeing a paw like figure that went from five toes to three. No ocular pain or discomfort.       Last edited by Bernarda Caffey, MD on 05/01/2021  5:20 PM.     Patient states gas bubble may be getting smaller, seeing a pawlike figure that went from five toes to three. No eye pain.   Referring physician: No referring provider defined for this encounter.  HISTORICAL INFORMATION:   Selected notes from the [facility] for eval of increased floaters/veil OS   CURRENT MEDICATIONS: Current Outpatient Medications (Ophthalmic Drugs)  Medication Sig   neomycin-polymyxin-dexameth (MAXITROL) 0.1 % OINT Place 1 application into the right eye 4 (four) times daily for 14 days. (Patient not taking: Reported on 04/30/2021)   No current facility-administered medications for this visit. (Ophthalmic Drugs)   Current Outpatient Medications (Other)  Medication Sig   acidophilus (RISAQUAD) CAPS capsule Take 1 capsule by mouth daily. Align once daily   cholecalciferol (VITAMIN D3) 25 MCG (1000 UNIT) tablet Take 1,000 Units by mouth daily.   Cyanocobalamin 1000 MCG/ML KIT Inject 1 mL as directed. Every 2 weeks   psyllium (METAMUCIL) 58.6 % powder Take 1 packet by mouth daily.   No current facility-administered medications for this visit. (Other)   REVIEW OF SYSTEMS: ROS   Positive for:  Cardiovascular, Eyes Negative for: Constitutional, Gastrointestinal, Neurological, Skin, Genitourinary, Musculoskeletal, HENT, Endocrine, Respiratory, Psychiatric, Allergic/Imm, Heme/Lymph Last edited by Kingsley Spittle, COT on 05/01/2021  9:28 AM.     ALLERGIES Allergies  Allergen Reactions   Codone [Hydrocodone] Hives   Clarithromycin Anxiety    PAST MEDICAL HISTORY Past Medical History:  Diagnosis Date   Celiac disease    Retinal detachment    Past Surgical History:  Procedure Laterality Date   CATARACT EXTRACTION Bilateral    Dr. Valetta Close   EYE SURGERY     FRACTURE SURGERY     HERNIA REPAIR     RETINAL DETACHMENT SURGERY      FAMILY HISTORY History reviewed. No pertinent family history.  SOCIAL HISTORY Social History   Tobacco Use   Smoking status: Former    Types: Cigarettes    Quit date: 01/30/2006    Years since quitting: 15.2   Smokeless tobacco: Never  Vaping Use   Vaping Use: Never used  Substance Use Topics   Alcohol use: Yes    Alcohol/week: 4.0 - 5.0 standard drinks    Types: 4 - 5 Shots of liquor per week    Comment: 3 glasses of whiskey per night.   Drug use: Yes    Frequency: 7.0 times per week    Types: Marijuana       OPHTHALMIC EXAM:  Base Eye Exam     Visual Acuity (Snellen - Linear)  Right Left   Dist [place] 20/25 +1     Correction: Glasses         Tonometry (Tonopen, 9:32 AM)       Right Left   Pressure 13 def         Pupils       Dark Light Shape React APD   Right        Left 4 3 Round Brisk None         Visual Fields       Left Right    Full Full         Neuro/Psych     Oriented x3: Yes   Mood/Affect: Normal         Dilation     Right eye: 1.0% Mydriacyl, 2.5% Phenylephrine @ 9:33 AM           Slit Lamp and Fundus Exam     Slit Lamp Exam       Right Left   Lids/Lashes Dermatochalasis - upper lid Dermatochalasis - upper lid, mild Meibomian gland dysfunction   Conjunctiva/Sclera  Subconjunctival hemorrhage - improving, pneumatic Chemosis White and quiet   Cornea arcus, well healed temporal cataract wounds, trace PEE arcus, well healed temporal cataract wounds   Anterior Chamber Deep and quiet Deep and quiet   Iris Round and dilated Round and dilated   Lens PC IOL in good position, trace Posterior capsular opacification PC IOL in good position, trace Posterior capsular opacification   Vitreous Vitreous syneresis, prominent vitreous condensations settled inferiorly, +pigment, Posterior vitreous detachment; mutliple gas bubbles superiorly about 25-30%  Vitreous syneresis, no pigment, Posterior vitreous detachment, Weiss ring, prominent linear vitreous condensation in IN quad         Fundus Exam       Right Left   Disc Pink and Sharp, Compact Pink and Sharp, Compact, mild tilt   C/D Ratio 0.1 0.2   Macula Flat, good foveal reflex, trace ERM, mild RPE mottling, No heme or edema, shallow SRF just within IT arcades Flat, Blunted foveal reflex, mild RPE mottling, No heme or edema   Vessels mild attenuation, mild tortuousity mild attenuation, mild tortuousity   Periphery Bullous nasal RD from 1230-0630 with small HST at 0130; interval improvement in superonasal SRF -- retina reattaching; residual SRF inferiorly--improving, good early cryo changes around 0130 tear Attached, mild pigmented paving stone degeneration IT quad, No heme , No RT/RD           Refraction     Wearing Rx       Sphere Cylinder Axis Add   Right +0.25 Sphere  +2.50   Left -0.50 +0.75 117 +2.50           IMAGING AND PROCEDURES  Imaging and Procedures for 05/01/2021  OCT, Retina - OU - Both Eyes       Right Eye Quality was good. Central Foveal Thickness: 285. Progression has improved. Findings include normal foveal contour, no IRF, vitreomacular adhesion , subretinal fluid (Interval improvement in superonasal SRF -- retina reattaching; residual SRF inferior, improving).   Left Eye Quality  was good. Central Foveal Thickness: 265. Progression has been stable. Findings include normal foveal contour, no IRF, no SRF (Mild vitreous opacities).   Notes *Images captured and stored on drive  Diagnosis / Impression:  OD: NFP, no IRF; Interval improvement in superonasal SRF -- retina reattaching; residual SRF inferior, improving OS: NFP, no IRF/SRF  Clinical management:  See below  Abbreviations: NFP - Normal  foveal profile. CME - cystoid macular edema. PED - pigment epithelial detachment. IRF - intraretinal fluid. SRF - subretinal fluid. EZ - ellipsoid zone. ERM - epiretinal membrane. ORA - outer retinal atrophy. ORT - outer retinal tubulation. SRHM - subretinal hyper-reflective material. IRHM - intraretinal hyper-reflective material            ASSESSMENT/PLAN:    ICD-10-CM   1. Right retinal detachment  H33.21     2. Retinal edema  H35.81 OCT, Retina - OU - Both Eyes    3. Posterior vitreous detachment of left eye  H43.812     4. Pseudophakia, both eyes  Z96.1      1,2. 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 PVD OS  - acute development of prominent floaters OS -- saw Dr. Katy Fitch on call on Saturday, 10.16.21  - Discussed findings and prognosis  - No RT or RD on 360 scleral depressed exam  - Reviewed s/s of RT/RD  - Strict return precautions for any such RT/RD signs/symptoms   - f/u in 3-4 wks -- DFE/OCT  4. Pseudophakia OU  - s/p CE/IOL OU  (Dr. Valetta Close)  - IOLs in good position, doing well  - monitor   Ophthalmic Meds Ordered this visit:  No orders of the defined types were placed in this encounter.    Return in 3 days (on 05/04/2021) for DFE OD, OCT.  There are no Patient Instructions on file for this visit.   Explained the diagnoses, plan, and follow up with the patient and they expressed understanding.  Patient expressed understanding of the importance of proper follow up care.    This document serves as a record of services personally performed by Gardiner Sleeper, MD, PhD. It was created on their behalf by Leonie Douglas, an ophthalmic technician. The creation of this record is the provider's dictation and/or activities during the visit.    Electronically signed by: Leonie Douglas COA, 05/01/21  5:22 PM  This document serves as a record of services personally performed by Gardiner Sleeper, MD, PhD. It was created on their behalf by Roselee Nova, COMT. The creation of this record is the provider's dictation and/or activities during the visit.  Electronically signed by: Roselee Nova, COMT 05/01/21 5:22 PM  Gardiner Sleeper, M.D., Ph.D. Diseases & Surgery of the Retina and [place] 05/01/2021  I have reviewed the above documentation for accuracy and completeness, and I agree with the above. Gardiner Sleeper, M.D., Ph.D. 05/01/21 5:23 PM   Abbreviations: M myopia (nearsighted); A astigmatism; H hyperopia (farsighted); P presbyopia; Mrx spectacle prescription;  CTL contact lenses; OD right eye; OS left eye; OU both eyes  XT exotropia; ET esotropia; PEK punctate epithelial keratitis; PEE punctate epithelial erosions; DES dry eye syndrome; MGD meibomian gland dysfunction; ATs artificial tears; PFAT's preservative free artificial tears; [place] nuclear sclerotic cataract; PSC posterior subcapsular cataract; ERM epi-retinal membrane; PVD posterior vitreous detachment; RD retinal detachment; DM diabetes  mellitus; DR diabetic retinopathy; NPDR  non-proliferative diabetic retinopathy; PDR proliferative diabetic retinopathy; CSME clinically significant macular edema; DME diabetic macular edema; dbh dot blot hemorrhages; CWS cotton wool spot; POAG primary open angle glaucoma; C/D cup-to-disc ratio; HVF humphrey visual field; GVF goldmann visual field; OCT optical coherence tomography; IOP intraocular pressure; BRVO Branch retinal vein occlusion; CRVO central retinal vein occlusion; CRAO central retinal artery occlusion; BRAO branch retinal artery occlusion; RT retinal tear; SB scleral buckle; PPV pars plana vitrectomy; VH Vitreous hemorrhage; PRP panretinal laser photocoagulation; IVK intravitreal kenalog; VMT vitreomacular traction; MH Macular hole;  NVD neovascularization of the disc; NVE neovascularization elsewhere; AREDS age related eye disease study; ARMD age related macular degeneration; POAG primary open angle glaucoma; EBMD epithelial/anterior basement membrane dystrophy; ACIOL anterior chamber intraocular lens; IOL intraocular lens; PCIOL posterior chamber intraocular lens; Phaco/IOL phacoemulsification with intraocular lens placement; [place] photorefractive keratectomy; LASIK laser assisted in situ keratomileusis; HTN hypertension; DM diabetes mellitus; COPD chronic obstructive pulmonary disease

## 2021-05-01 NOTE — Progress Notes (Signed)
[address]       [place] [address]             [phone]       Patient: Home Provider: Office Consent for Telemedicine visit obtained.  Today's visit was completed via a real-time telehealth (see specific modality noted below). The patient/authorized person provided oral consent at the time of the visit to engage in a telemedicine encounter with the present provider at [facility]. The patient/authorized person was informed of the potential benefits, limitations, and risks of telemedicine. The patient/authorized person expressed understanding that the laws that protect confidentiality also apply to telemedicine. The patient/authorized person acknowledged understanding that telemedicine does not provide emergency services and that he or she would need to call 911 or proceed to the nearest hospital for help if such a need arose.   Total time spent in the clinical discussion 10 minutes.  Telehealth Modality: Phone visit (audio only)  I had a telephone visit with Douglas Cox.  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  He has met with gastroenterology and they would like to proceed with an endoscopy but awaiting on the final work-up for the mediastinal mass.  He underwent a PET/CT which showed a cystic structure with no significant uptake favoring a benign cyst.  His tumor markers are all normal except his AFP is elevated at as well., and his TSH was elevated at 8 as well.  He denies any symptoms.   Based on his imaging and the likelihood that this is a benign cyst I still would like to follow this with an MRI of the chest in 3 months.  He will require ongoing surveillance.  There is no need for surgery at this point.  In regards to his esophageal thickening I think that it is important for him to proceed with his endoscopy and from my standpoint he is cleared to do so.  I will touch base  with his primary care doctor in regards to his elevated AFP and TSH.  I think that there is a very small likelihood that with the elevated AFP that this relates to the possibility of a germ cell tumor given the characteristics on cross-sectional imaging.  I will see him back in 3 months with an MRI.

## 2021-05-04 ENCOUNTER — Other Ambulatory Visit: Payer: Self-pay

## 2021-05-04 ENCOUNTER — Encounter (INDEPENDENT_AMBULATORY_CARE_PROVIDER_SITE_OTHER): Payer: Self-pay | Admitting: Ophthalmology

## 2021-05-04 ENCOUNTER — Ambulatory Visit (INDEPENDENT_AMBULATORY_CARE_PROVIDER_SITE_OTHER): Payer: Medicare Other | Admitting: Ophthalmology

## 2021-05-04 DIAGNOSIS — H3321 Serous retinal detachment, right eye: Secondary | ICD-10-CM | POA: Diagnosis not present

## 2021-05-04 DIAGNOSIS — H43812 Vitreous degeneration, left eye: Secondary | ICD-10-CM

## 2021-05-04 DIAGNOSIS — Z961 Presence of intraocular lens: Secondary | ICD-10-CM

## 2021-05-04 DIAGNOSIS — H3581 Retinal edema: Secondary | ICD-10-CM

## 2021-05-04 NOTE — Progress Notes (Signed)
[facility] Note  05/04/2021     CHIEF COMPLAINT Patient presents for Retina Follow Up  HISTORY OF PRESENT ILLNESS: Douglas Cox is a 70 y.o. male who presents to the clinic today for:   HPI     Retina Follow Up   Patient presents with  Retinal Break/Detachment.  In right eye.  Severity is mild.  Since onset it is gradually improving.  I, the attending physician,  performed the HPI with the patient and updated documentation appropriately.        Comments   Pt here for 3 day ret f/u RD OD. Pt states vision is the same, now seeing an eclipse shape in bottom R quadrant of vision OD versus a dog paw.       Last edited by Bernarda Caffey, MD on 05/04/2021 12:57 PM.    Patient states the gas bubble is now only one bubble and it seems to be getting smaller  Referring physician: No referring provider defined for this encounter.  HISTORICAL INFORMATION:   Selected notes from the [facility] for eval of increased floaters/veil OS   CURRENT MEDICATIONS: Current Outpatient Medications (Ophthalmic Drugs)  Medication Sig   neomycin-polymyxin-dexameth (MAXITROL) 0.1 % OINT Place 1 application into the right eye 4 (four) times daily for 14 days. (Patient not taking: Reported on 04/30/2021)   No current facility-administered medications for this visit. (Ophthalmic Drugs)   Current Outpatient Medications (Other)  Medication Sig   acidophilus (RISAQUAD) CAPS capsule Take 1 capsule by mouth daily. Align once daily   cholecalciferol (VITAMIN D3) 25 MCG (1000 UNIT) tablet Take 1,000 Units by mouth daily.   Cyanocobalamin 1000 MCG/ML KIT Inject 1 mL as directed. Every 2 weeks   psyllium (METAMUCIL) 58.6 % powder Take 1 packet by mouth daily.   No current facility-administered medications for this visit. (Other)   REVIEW OF SYSTEMS: ROS   Positive for: Cardiovascular, Eyes Negative for: Constitutional, Gastrointestinal,  Neurological, Skin, Genitourinary, Musculoskeletal, HENT, Endocrine, Respiratory, Psychiatric, Allergic/Imm, Heme/Lymph Last edited by Kingsley Spittle, COT on 05/04/2021 10:23 AM.     ALLERGIES Allergies  Allergen Reactions   Codone [Hydrocodone] Hives   Clarithromycin Anxiety    PAST MEDICAL HISTORY Past Medical History:  Diagnosis Date   Celiac disease    Retinal detachment    Past Surgical History:  Procedure Laterality Date   CATARACT EXTRACTION Bilateral    Dr. Valetta Close   EYE SURGERY     FRACTURE SURGERY     HERNIA REPAIR     RETINAL DETACHMENT SURGERY     FAMILY HISTORY History reviewed. No pertinent family history.  SOCIAL HISTORY Social History   Tobacco Use   Smoking status: Former    Types: Cigarettes    Quit date: 01/30/2006    Years since quitting: 15.2   Smokeless tobacco: Never  Vaping Use   Vaping Use: Never used  Substance Use Topics   Alcohol use: Yes    Alcohol/week: 4.0 - 5.0 standard drinks    Types: 4 - 5 Shots of liquor per week    Comment: 3 glasses of whiskey per night.   Drug use: Yes    Frequency: 7.0 times per week    Types: Marijuana       OPHTHALMIC EXAM:  Base Eye Exam     Visual Acuity (Snellen - Linear)       Right Left   Dist [place] 20/20  Tonometry (Tonopen, 10:27 AM)       Right Left   Pressure 13          Pupils       Dark Light Shape React APD   Right 4 3 Round Brisk None   Left 4 3 Round Brisk None         Visual Fields (Counting fingers)       Left Right    Full Full         Extraocular Movement       Right Left    Full, Ortho Full, Ortho         Neuro/Psych     Oriented x3: Yes   Mood/Affect: Normal         Dilation     Right eye: 1.0% Mydriacyl, 2.5% Phenylephrine @ 10:27 AM           Slit Lamp and Fundus Exam     Slit Lamp Exam       Right Left   Lids/Lashes Dermatochalasis - upper lid Dermatochalasis - upper lid, mild Meibomian gland dysfunction    Conjunctiva/Sclera Subconjunctival hemorrhage - improving, pneumatic Chemosis White and quiet   Cornea arcus, well healed temporal cataract wounds, 1-2+PEE arcus, well healed temporal cataract wounds   Anterior Chamber Deep and quiet Deep and quiet   Iris Round and dilated Round and dilated   Lens PC IOL in good position, trace Posterior capsular opacification PC IOL in good position, trace Posterior capsular opacification   Vitreous Vitreous syneresis, prominent vitreous condensations settled inferiorly, +pigment, Posterior vitreous detachment; single gas bubble about 30%  Vitreous syneresis, no pigment, Posterior vitreous detachment, Weiss ring, prominent linear vitreous condensation in IN quad         Fundus Exam       Right Left   Disc Pink and Sharp, Compact Pink and Sharp, Compact, mild tilt   C/D Ratio 0.1 0.2   Macula Flat, good foveal reflex, trace ERM, mild RPE mottling, No heme or edema, shallow SRF just within IT arcades Flat, Blunted foveal reflex, mild RPE mottling, No heme or edema   Vessels mild attenuation, mild tortuousity mild attenuation, mild tortuousity   Periphery Bullous nasal RD from 1230-0630 with small HST at 0130; interval improvement in superonasal SRF -- retina reattaching; residual SRF inferiorly--mild shifting inf temporal, good early cryo changes around 0130 tear Attached, mild pigmented paving stone degeneration IT quad, No heme , No RT/RD           Refraction     Wearing Rx       Sphere Cylinder Axis Add   Right +0.25 Sphere  +2.50   Left -0.50 +0.75 117 +2.50           IMAGING AND PROCEDURES  Imaging and Procedures for 05/04/2021  OCT, Retina - OU - Both Eyes       Right Eye Quality was good. Central Foveal Thickness: 294. Progression has improved. Findings include normal foveal contour, no IRF, vitreomacular adhesion , subretinal fluid (stable improvement in superonasal SRF -- retina reattaching; residual SRF inferior, ?increased).    Left Eye Quality was good. Central Foveal Thickness: 271. Progression has been stable. Findings include normal foveal contour, no IRF, no SRF (Mild vitreous opacities).   Notes *Images captured and stored on drive  Diagnosis / Impression:  OD: NFP, no IRF; Interval improvement in superonasal SRF -- retina reattaching; residual SRF inferior--mild inf temporal shift ?increased OS: NFP, no IRF/SRF  Clinical  management:  See below  Abbreviations: NFP - Normal foveal profile. CME - cystoid macular edema. PED - pigment epithelial detachment. IRF - intraretinal fluid. SRF - subretinal fluid. EZ - ellipsoid zone. ERM - epiretinal membrane. ORA - outer retinal atrophy. ORT - outer retinal tubulation. SRHM - subretinal hyper-reflective material. IRHM - intraretinal hyper-reflective material            ASSESSMENT/PLAN:    ICD-10-CM   1. Right retinal detachment  H33.21     2. Retinal edema  H35.81 OCT, Retina - OU - Both Eyes    3. Posterior vitreous detachment of left eye  H43.812     4. Pseudophakia, both eyes  Z96.1     1,2. 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 PVD OS  - acute development of prominent floaters OS -- saw Dr. Katy Fitch on call on Saturday, 10.16.21  - Discussed findings and prognosis  - No RT or RD on 360 scleral depressed exam  - Reviewed s/s of RT/RD  -  Strict return precautions for any such RT/RD signs/symptoms   - f/u in 3-4 wks -- DFE/OCT  4. Pseudophakia OU  - s/p CE/IOL OU (Dr. Valetta Close)  - IOLs in good position, doing well  - monitor   Ophthalmic Meds Ordered this visit:  No orders of the defined types were placed in this encounter.    Return in about 2 days (around 05/06/2021) for f/u Weds for RD OD, DFE, OCT.  There are no Patient Instructions on file for this visit.   Explained the diagnoses, plan, and follow up with the patient and they expressed understanding.  Patient expressed understanding of the importance of proper follow up care.    This document serves as a record of services personally performed by Gardiner Sleeper, MD, PhD. It was created on their behalf by Leonie Douglas, an ophthalmic technician. The creation of this record is the provider's dictation and/or activities during the visit.    Electronically signed by: Leonie Douglas COA, 05/04/21  12:59 PM  This document serves as a record of services personally performed by Gardiner Sleeper, MD, PhD. It was created on their behalf by Roselee Nova, COMT. The creation of this record is the provider's dictation and/or activities during the visit.  Electronically signed by: Roselee Nova, COMT 05/04/21 12:59 PM  Gardiner Sleeper, M.D., Ph.D. Diseases & Surgery of the Retina and Vitreous [facility]  I have reviewed the above documentation for accuracy and completeness, and I agree with the above. Gardiner Sleeper, M.D., Ph.D. 05/04/21 1:01 PM  Abbreviations: M myopia (nearsighted); A astigmatism; H hyperopia (farsighted); P presbyopia; Mrx spectacle prescription;  CTL contact lenses; OD right eye; OS left eye; OU both eyes  XT exotropia; ET esotropia; PEK punctate epithelial keratitis; PEE punctate epithelial erosions; DES dry eye syndrome; MGD meibomian gland dysfunction; ATs artificial tears; PFAT's preservative free artificial  tears; [place] nuclear  sclerotic cataract; PSC posterior subcapsular cataract; ERM epi-retinal membrane; PVD posterior vitreous detachment; RD retinal detachment; DM diabetes mellitus; DR diabetic retinopathy; NPDR non-proliferative diabetic retinopathy; PDR proliferative diabetic retinopathy; CSME clinically significant macular edema; DME diabetic macular edema; dbh dot blot hemorrhages; CWS cotton wool spot; POAG primary open angle glaucoma; C/D cup-to-disc ratio; HVF humphrey visual field; GVF goldmann visual field; OCT optical coherence tomography; IOP intraocular pressure; BRVO Branch retinal vein occlusion; CRVO central retinal vein occlusion; CRAO central retinal artery occlusion; BRAO branch retinal artery occlusion; RT retinal tear; SB scleral buckle; PPV pars plana vitrectomy; VH Vitreous hemorrhage; PRP panretinal laser photocoagulation; IVK intravitreal kenalog; VMT vitreomacular traction; MH Macular hole;  NVD neovascularization of the disc; NVE neovascularization elsewhere; AREDS age related eye disease study; ARMD age related macular degeneration; POAG primary open angle glaucoma; EBMD epithelial/anterior basement membrane dystrophy; ACIOL anterior chamber intraocular lens; IOL intraocular lens; PCIOL posterior chamber intraocular lens; Phaco/IOL phacoemulsification with intraocular lens placement; [place] photorefractive keratectomy; LASIK laser assisted in situ keratomileusis; HTN hypertension; DM diabetes mellitus; COPD chronic obstructive pulmonary disease

## 2021-05-04 NOTE — Progress Notes (Signed)
[facility] Note  05/06/2021     CHIEF COMPLAINT Patient presents for Retina Follow Up  HISTORY OF PRESENT ILLNESS: Douglas Cox is a 70 y.o. male who presents to the clinic today for:   HPI     Retina Follow Up   Patient presents with  Retinal Break/Detachment.  In right eye.  This started 2 days ago.  I, the attending physician,  performed the HPI with the patient and updated documentation appropriately.        Comments   Patient here for 2 days retina follow up for RD OD. Patient states vision doing ok. OD aches on occasion.  OS is peachy.      Last edited by Bernarda Caffey, MD on 05/06/2021  1:56 PM.    Pt states for the past couple of days, he has not been as still as before, he has had some things that he has had to take care of, but states he has kept his face down while doing so  Referring physician: No referring provider defined for this encounter.  HISTORICAL INFORMATION:   Selected notes from the [facility] for eval of increased floaters/veil OS   CURRENT MEDICATIONS: Current Outpatient Medications (Ophthalmic Drugs)  Medication Sig   neomycin-polymyxin-dexameth (MAXITROL) 0.1 % OINT Place 1 application into the right eye 4 (four) times daily for 14 days. (Patient not taking: Reported on 04/30/2021)   No current facility-administered medications for this visit. (Ophthalmic Drugs)   Current Outpatient Medications (Other)  Medication Sig   acidophilus (RISAQUAD) CAPS capsule Take 1 capsule by mouth daily. Align once daily   cholecalciferol (VITAMIN D3) 25 MCG (1000 UNIT) tablet Take 1,000 Units by mouth daily.   Cyanocobalamin 1000 MCG/ML KIT Inject 1 mL as directed. Every 2 weeks   psyllium (METAMUCIL) 58.6 % powder Take 1 packet by mouth daily.   No current facility-administered medications for this visit. (Other)   REVIEW OF SYSTEMS: ROS   Positive for: Cardiovascular, Eyes Negative for:  Constitutional, Gastrointestinal, Neurological, Skin, Genitourinary, Musculoskeletal, HENT, Endocrine, Respiratory, Psychiatric, Allergic/Imm, Heme/Lymph Last edited by Theodore Demark, COA on 05/06/2021  1:07 PM.      ALLERGIES Allergies  Allergen Reactions   Codone [Hydrocodone] Hives   Clarithromycin Anxiety    PAST MEDICAL HISTORY Past Medical History:  Diagnosis Date   Celiac disease    Retinal detachment    Past Surgical History:  Procedure Laterality Date   CATARACT EXTRACTION Bilateral    Dr. Valetta Close   EYE SURGERY     FRACTURE SURGERY     HERNIA REPAIR     RETINAL DETACHMENT SURGERY     FAMILY HISTORY History reviewed. No pertinent family history.  SOCIAL HISTORY Social History   Tobacco Use   Smoking status: Former    Types: Cigarettes    Quit date: 01/30/2006    Years since quitting: 15.2   Smokeless tobacco: Never  Vaping Use   Vaping Use: Never used  Substance Use Topics   Alcohol use: Yes    Alcohol/week: 4.0 - 5.0 standard drinks    Types: 4 - 5 Shots of liquor per week    Comment: 3 glasses of whiskey per night.   Drug use: Yes    Frequency: 7.0 times per week    Types: Marijuana       OPHTHALMIC EXAM:  Base Eye Exam     Visual Acuity (Snellen - Linear)  Right Left   Dist [place] 20/20          Tonometry (Tonopen, 1:04 PM)       Right Left   Pressure 12          Pupils       Dark Light Shape React APD   Right 4 3 Round Brisk None   Left              Visual Fields (Counting fingers)       Left Right    Full Full         Extraocular Movement       Right Left    Full, Ortho Full, Ortho         Neuro/Psych     Oriented x3: Yes   Mood/Affect: Normal         Dilation     Right eye: 1.0% Mydriacyl, 2.5% Phenylephrine @ 1:04 PM           Slit Lamp and Fundus Exam     Slit Lamp Exam       Right Left   Lids/Lashes Dermatochalasis - upper lid Dermatochalasis - upper lid, mild Meibomian gland  dysfunction   Conjunctiva/Sclera Subconjunctival hemorrhage - improving, pneumatic Chemosis White and quiet   Cornea arcus, well healed temporal cataract wounds, 2+PEE arcus, well healed temporal cataract wounds   Anterior Chamber Deep and quiet Deep and quiet   Iris Round and dilated Round and dilated   Lens PC IOL in good position, trace Posterior capsular opacification PC IOL in good position, trace Posterior capsular opacification   Vitreous Vitreous syneresis, prominent vitreous condensations settled inferiorly, +pigment, Posterior vitreous detachment; single gas bubble about 25-30%  Vitreous syneresis, no pigment, Posterior vitreous detachment, Weiss ring, prominent linear vitreous condensation in IN quad         Fundus Exam       Right Left   Disc Pink and Sharp, Compact Pink and Sharp, Compact, mild tilt   C/D Ratio 0.1 0.2   Macula Flat, good foveal reflex, trace ERM, mild RPE mottling, No heme or edema, shallow SRF just within IT arcades Flat, Blunted foveal reflex, mild RPE mottling, No heme or edema   Vessels mild attenuation, mild tortuousity mild attenuation, mild tortuousity   Periphery Bullous nasal RD from 2353-6144 with small HST at 0130; interval improvement in superonasal SRF -- retina reattaching; residual SRF inferiorly--mild shifting inf temporal, good early cryo changes around 0130 tear Attached, mild pigmented paving stone degeneration IT quad, No heme , No RT/RD           Refraction     Wearing Rx       Sphere Cylinder Axis Add   Right +0.25 Sphere  +2.50   Left -0.50 +0.75 117 +2.50           IMAGING AND PROCEDURES  Imaging and Procedures for 05/06/2021  OCT, Retina - OU - Both Eyes       Right Eye Quality was good. Central Foveal Thickness: 291. Progression has improved. Findings include normal foveal contour, no IRF, vitreomacular adhesion , subretinal fluid (stable improvement in superonasal SRF -- retina reattaching; residual SRF inferior,  improved).   Left Eye Quality was good. Central Foveal Thickness: 271. Progression has been stable. Findings include normal foveal contour, no IRF, no SRF (Mild vitreous opacities).   Notes *Images captured and stored on drive  Diagnosis / Impression:  OD: NFP, no IRF; Interval improvement in superonasal SRF --  retina reattaching; residual SRF inferior--mild inf temporal shift - improving OS: NFP, no IRF/SRF  Clinical management:  See below  Abbreviations: NFP - Normal foveal profile. CME - cystoid macular edema. PED - pigment epithelial detachment. IRF - intraretinal fluid. SRF - subretinal fluid. EZ - ellipsoid zone. ERM - epiretinal membrane. ORA - outer retinal atrophy. ORT - outer retinal tubulation. SRHM - subretinal hyper-reflective material. IRHM - intraretinal hyper-reflective material            ASSESSMENT/PLAN:    ICD-10-CM   1. Right retinal detachment  H33.21     2. Retinal edema  H35.81 OCT, Retina - OU - Both Eyes    3. Posterior vitreous detachment of left eye  H43.812     4. Pseudophakia, both eyes  Z96.1      1,2. 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 PVD OS  - acute development of prominent floaters OS -- saw Dr. Katy Fitch on call on Saturday, 10.16.21  - Discussed findings and prognosis  - No RT or RD on 360 scleral depressed exam  - Reviewed s/s  of RT/RD  - Strict return precautions for any such RT/RD signs/symptoms   - f/u in 3-4 wks -- DFE/OCT  4. Pseudophakia OU  - s/p CE/IOL OU (Dr. Valetta Close)  - IOLs in good position, doing well  - monitor  Ophthalmic Meds Ordered this visit:  No orders of the defined types were placed in this encounter.    Return in about 2 days (around 05/08/2021) for f/u RD OD, DFE, OCT.  There are no Patient Instructions on file for this visit.   Explained the diagnoses, plan, and follow up with the patient and they expressed understanding.  Patient expressed understanding of the importance of proper follow up care.    This document serves as a record of services personally performed by Gardiner Sleeper, MD, PhD. It was created on their behalf by Orvan Falconer, an ophthalmic technician. The creation of this record is the provider's dictation and/or activities during the visit.    Electronically signed by: Orvan Falconer, OA, 05/06/21  1:57 PM   This document serves as a record of services personally performed by Gardiner Sleeper, MD, PhD. It was created on their behalf by San Jetty. Owens Shark, OA an ophthalmic technician. The creation of this record is the provider's dictation and/or activities during the visit.    Electronically signed by: San Jetty. Owens Shark, [place] 10.05.2022 1:57 PM   Gardiner Sleeper, M.D., Ph.D. Diseases & Surgery of the Retina and Vitreous [facility]  I have reviewed the above documentation for accuracy and completeness, and I agree with the above. Gardiner Sleeper, M.D., Ph.D. 05/06/21 1:58 PM   Abbreviations: M myopia (nearsighted); A astigmatism; H hyperopia (farsighted); P presbyopia; Mrx spectacle prescription;  CTL contact lenses; OD right eye; OS left eye; OU both eyes  XT exotropia; ET  esotropia; PEK punctate epithelial keratitis; PEE punctate epithelial erosions; DES dry eye syndrome; MGD meibomian gland dysfunction; ATs artificial tears; PFAT's  preservative free artificial tears; [place] nuclear sclerotic cataract; PSC posterior subcapsular cataract; ERM epi-retinal membrane; PVD posterior vitreous detachment; RD retinal detachment; DM diabetes mellitus; DR diabetic retinopathy; NPDR non-proliferative diabetic retinopathy; PDR proliferative diabetic retinopathy; CSME clinically significant macular edema; DME diabetic macular edema; dbh dot blot hemorrhages; CWS cotton wool spot; POAG primary open angle glaucoma; C/D cup-to-disc ratio; HVF humphrey visual field; GVF goldmann visual field; OCT optical coherence tomography; IOP intraocular pressure; BRVO Branch retinal vein occlusion; CRVO central retinal vein occlusion; CRAO central retinal artery occlusion; BRAO branch retinal artery occlusion; RT retinal tear; SB scleral buckle; PPV pars plana vitrectomy; VH Vitreous hemorrhage; PRP panretinal laser photocoagulation; IVK intravitreal kenalog; VMT vitreomacular traction; MH Macular hole;  NVD neovascularization of the disc; NVE neovascularization elsewhere; AREDS age related eye disease study; ARMD age related macular degeneration; POAG primary open angle glaucoma; EBMD epithelial/anterior basement membrane dystrophy; ACIOL anterior chamber intraocular lens; IOL intraocular lens; PCIOL posterior chamber intraocular lens; Phaco/IOL phacoemulsification with intraocular lens placement; [place] photorefractive keratectomy; LASIK laser assisted in situ keratomileusis; HTN hypertension; DM diabetes mellitus; COPD chronic obstructive pulmonary disease

## 2021-05-06 ENCOUNTER — Encounter (INDEPENDENT_AMBULATORY_CARE_PROVIDER_SITE_OTHER): Payer: Self-pay | Admitting: Ophthalmology

## 2021-05-06 ENCOUNTER — Other Ambulatory Visit: Payer: Self-pay

## 2021-05-06 ENCOUNTER — Ambulatory Visit (INDEPENDENT_AMBULATORY_CARE_PROVIDER_SITE_OTHER): Payer: Medicare Other | Admitting: Ophthalmology

## 2021-05-06 ENCOUNTER — Telehealth: Payer: Self-pay

## 2021-05-06 DIAGNOSIS — H43812 Vitreous degeneration, left eye: Secondary | ICD-10-CM | POA: Diagnosis not present

## 2021-05-06 DIAGNOSIS — H3581 Retinal edema: Secondary | ICD-10-CM

## 2021-05-06 DIAGNOSIS — Z961 Presence of intraocular lens: Secondary | ICD-10-CM | POA: Diagnosis not present

## 2021-05-06 DIAGNOSIS — H3321 Serous retinal detachment, right eye: Secondary | ICD-10-CM

## 2021-05-06 NOTE — Telephone Encounter (Signed)
Spoke with the patient. He is interested in pursuing the EGD to investigate the esophageal thickening. Unfortunately since he was seen here, he has suffered a detached retina. His ophthalmologist is Dr Coralyn Pear. He understands he needs to be released by Dr Coralyn Pear before having an EGD. He wants to schedule this for 5 weeks out. His follow up with Dr Kipp Brood is in December. Your recommendations?

## 2021-05-07 NOTE — Telephone Encounter (Signed)
Agree with deferring EGD until he has clearance from retina specialist. Please place recall to reschedule.Thanks

## 2021-05-08 ENCOUNTER — Encounter (INDEPENDENT_AMBULATORY_CARE_PROVIDER_SITE_OTHER): Payer: Self-pay | Admitting: Ophthalmology

## 2021-05-08 ENCOUNTER — Ambulatory Visit (INDEPENDENT_AMBULATORY_CARE_PROVIDER_SITE_OTHER): Payer: Medicare Other | Admitting: Ophthalmology

## 2021-05-08 ENCOUNTER — Other Ambulatory Visit: Payer: Self-pay

## 2021-05-08 DIAGNOSIS — H3321 Serous retinal detachment, right eye: Secondary | ICD-10-CM

## 2021-05-08 DIAGNOSIS — H43812 Vitreous degeneration, left eye: Secondary | ICD-10-CM

## 2021-05-08 DIAGNOSIS — Z961 Presence of intraocular lens: Secondary | ICD-10-CM

## 2021-05-08 DIAGNOSIS — H3581 Retinal edema: Secondary | ICD-10-CM

## 2021-05-08 NOTE — Progress Notes (Signed)
[facility] Note  05/11/2021     CHIEF COMPLAINT Patient presents for Retina Follow Up  HISTORY OF PRESENT ILLNESS: Douglas Cox is a 70 y.o. male who presents to the clinic today for:   HPI     Retina Follow Up   Patient presents with  Retinal Break/Detachment.  In right eye.  Severity is mild.  Duration of 3 days.  Since onset it is gradually improving.  I, the attending physician,  performed the HPI with the patient and updated documentation appropriately.        Comments   Pt here for 3 day ret f/u RD OD. Pt states no vision changes since previous encounter. No flashes, floaters or eye pain.       Last edited by Bernarda Caffey, MD on 05/11/2021  1:22 PM.    Pt states no changes in vision  Referring physician: No referring provider defined for this encounter.  HISTORICAL INFORMATION:   Selected notes from the [facility] for eval of increased floaters/veil OS   CURRENT MEDICATIONS: Current Outpatient Medications (Ophthalmic Drugs)  Medication Sig   neomycin-polymyxin-dexameth (MAXITROL) 0.1 % OINT Place 1 application into the right eye 4 (four) times daily for 14 days. (Patient not taking: Reported on 04/30/2021)   No current facility-administered medications for this visit. (Ophthalmic Drugs)   Current Outpatient Medications (Other)  Medication Sig   acidophilus (RISAQUAD) CAPS capsule Take 1 capsule by mouth daily. Align once daily   cholecalciferol (VITAMIN D3) 25 MCG (1000 UNIT) tablet Take 1,000 Units by mouth daily.   Cyanocobalamin 1000 MCG/ML KIT Inject 1 mL as directed. Every 2 weeks   psyllium (METAMUCIL) 58.6 % powder Take 1 packet by mouth daily.   No current facility-administered medications for this visit. (Other)   REVIEW OF SYSTEMS: ROS   Positive for: Cardiovascular, Eyes Negative for: Constitutional, Gastrointestinal, Neurological, Skin, Genitourinary, Musculoskeletal, HENT, Endocrine,  Respiratory, Psychiatric, Allergic/Imm, Heme/Lymph Last edited by Kingsley Spittle, COT on 05/11/2021  1:02 PM.     ALLERGIES Allergies  Allergen Reactions   Codone [Hydrocodone] Hives   Clarithromycin Anxiety   PAST MEDICAL HISTORY Past Medical History:  Diagnosis Date   Celiac disease    Retinal detachment    Past Surgical History:  Procedure Laterality Date   CATARACT EXTRACTION Bilateral    Dr. Valetta Close   EYE SURGERY     FRACTURE SURGERY     HERNIA REPAIR     RETINAL DETACHMENT SURGERY     FAMILY HISTORY History reviewed. No pertinent family history.  SOCIAL HISTORY Social History   Tobacco Use   Smoking status: Former    Types: Cigarettes    Quit date: 01/30/2006    Years since quitting: 15.2   Smokeless tobacco: Never  Vaping Use   Vaping Use: Never used  Substance Use Topics   Alcohol use: Yes    Alcohol/week: 4.0 - 5.0 standard drinks    Types: 4 - 5 Shots of liquor per week    Comment: 3 glasses of whiskey per night.   Drug use: Yes    Frequency: 7.0 times per week    Types: Marijuana       OPHTHALMIC EXAM:  Base Eye Exam     Visual Acuity (Snellen - Linear)       Right Left   Dist [place] 20/20          Tonometry (Tonopen, 1:10 PM)  Right Left   Pressure 13          Pupils       Dark Light Shape React APD   Right 3 2 Round Minimal None   Left              Visual Fields (Counting fingers)       Left Right    Full Full         Extraocular Movement       Right Left    Full, Ortho Full, Ortho         Neuro/Psych     Oriented x3: Yes   Mood/Affect: Normal         Dilation     Right eye: 1.0% Mydriacyl, 2.5% Phenylephrine @ 1:10 PM           Slit Lamp and Fundus Exam     Slit Lamp Exam       Right Left   Lids/Lashes Dermatochalasis - upper lid Dermatochalasis - upper lid, mild Meibomian gland dysfunction   Conjunctiva/Sclera Subconjunctival hemorrhage - improving, pneumatic Chemosis White and quiet    Cornea arcus, well healed temporal cataract wounds, 1-2+PEE arcus, well healed temporal cataract wounds   Anterior Chamber Deep and quiet Deep and quiet   Iris Round and dilated Round and dilated   Lens PC IOL in good position, trace Posterior capsular opacification PC IOL in good position, trace Posterior capsular opacification   Vitreous Vitreous syneresis, prominent vitreous condensations settled inferiorly, +pigment, Posterior vitreous detachment; single gas bubble about 20-25%  Vitreous syneresis, no pigment, Posterior vitreous detachment, Weiss ring, prominent linear vitreous condensation in IN quad         Fundus Exam       Right Left   Disc Pink and Sharp, Compact Pink and Sharp, Compact, mild tilt   C/D Ratio 0.1 0.2   Macula Flat, good foveal reflex, trace ERM, mild RPE mottling, No heme or edema, shallow SRF just within IT arcades Flat, Blunted foveal reflex, mild RPE mottling, No heme or edema   Vessels mild attenuation, mild tortuousity mild attenuation, mild tortuousity   Periphery Bullous nasal RD from 8119-1478 with small HST at 0130; interval improvement in superonasal SRF -- retina reattaching; residual SRF inferiorly--mild shifting inf temporal, cryo changes around 0130 tear -- not yet fully mature Attached, mild pigmented paving stone degeneration IT quad, No heme , No RT/RD           Refraction     Wearing Rx       Sphere Cylinder Axis Add   Right +0.25 Sphere  +2.50   Left -0.50 +0.75 117 +2.50           IMAGING AND PROCEDURES  Imaging and Procedures for 05/11/2021  OCT, Retina - OU - Both Eyes       Right Eye Quality was good. Central Foveal Thickness: 289. Progression has improved. Findings include normal foveal contour, no IRF, vitreomacular adhesion , subretinal fluid (stable improvement in superonasal SRF -- retina reattaching; residual SRF infereriorly -- improving).   Left Eye Quality was good. Central Foveal Thickness: 267. Progression  has been stable. Findings include normal foveal contour, no IRF, no SRF (Mild vitreous opacities).   Notes *Images captured and stored on drive  Diagnosis / Impression:  OD: NFP, no IRF; stable improvement in superonasal SRF -- retina reattaching; residual SRF infereriorly -- improving OS: NFP, no IRF/SRF  Clinical management:  See below  Abbreviations: NFP -  Normal foveal profile. CME - cystoid macular edema. PED - pigment epithelial detachment. IRF - intraretinal fluid. SRF - subretinal fluid. EZ - ellipsoid zone. ERM - epiretinal membrane. ORA - outer retinal atrophy. ORT - outer retinal tubulation. SRHM - subretinal hyper-reflective material. IRHM - intraretinal hyper-reflective material            ASSESSMENT/PLAN:   ICD-10-CM   1. Right retinal detachment  H33.21     2. Retinal edema  H35.81 OCT, Retina - OU - Both Eyes    3. Posterior vitreous detachment of left eye  H43.812     4. Pseudophakia, both eyes  Z96.1     1,2. 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 PVD OS  - acute development of prominent floaters OS -- saw Dr. Katy Fitch on call on Saturday, 10.16.21  - Discussed findings and prognosis  - No RT or RD on 360 scleral depressed exam  - Reviewed s/s of RT/RD  - Strict return precautions for any such RT/RD signs/symptoms   - f/u in 3-4 wks -- DFE/OCT  4.  Pseudophakia OU  - s/p CE/IOL OU (Dr. Valetta Close)  - IOLs in good position, doing well  - monitor  Ophthalmic Meds Ordered this visit:  No orders of the defined types were placed in this encounter.    Return in about 3 days (around 05/14/2021) for f/u RD OD, DFE, OCT.  There are no Patient Instructions on file for this visit.  This document serves as a record of services personally performed by Gardiner Sleeper, MD, PhD. It was created on their behalf by Leeann Must, [place], an ophthalmic technician. The creation of this record is the provider's dictation and/or activities during the visit.    Electronically signed by: Leeann Must, COA _0 @ 11:08 PM  This document serves as a record of services personally performed by Gardiner Sleeper, MD, PhD. It was created on their behalf by San Jetty. Owens Shark, OA an ophthalmic technician. The creation of this record is the provider's dictation and/or activities during the visit.    Electronically signed by: San Jetty. Owens Shark, [place] 10.10.2022 11:08 PM  Gardiner Sleeper, M.D., Ph.D. Diseases & Surgery of the Retina and [place] 05/11/2021   I have reviewed the above documentation for accuracy and completeness, and I agree with the above. Gardiner Sleeper, M.D., Ph.D. 05/12/21 11:10 PM  Abbreviations: M myopia (nearsighted); A astigmatism; H hyperopia (farsighted); P presbyopia; Mrx spectacle prescription;  CTL contact lenses; OD right eye; OS left eye; OU both eyes  XT exotropia; ET esotropia; PEK punctate epithelial keratitis; PEE punctate epithelial erosions; DES dry eye syndrome; MGD meibomian gland dysfunction; ATs artificial tears; PFAT's preservative free artificial tears; [place] nuclear sclerotic cataract; PSC posterior subcapsular cataract; ERM epi-retinal membrane; PVD posterior vitreous detachment; RD retinal detachment; DM diabetes mellitus; DR diabetic retinopathy; NPDR non-proliferative diabetic retinopathy; PDR  proliferative  diabetic retinopathy; CSME clinically significant macular edema; DME diabetic macular edema; dbh dot blot hemorrhages; CWS cotton wool spot; POAG primary open angle glaucoma; C/D cup-to-disc ratio; HVF humphrey visual field; GVF goldmann visual field; OCT optical coherence tomography; IOP intraocular pressure; BRVO Branch retinal vein occlusion; CRVO central retinal vein occlusion; CRAO central retinal artery occlusion; BRAO branch retinal artery occlusion; RT retinal tear; SB scleral buckle; PPV pars plana vitrectomy; VH Vitreous hemorrhage; PRP panretinal laser photocoagulation; IVK intravitreal kenalog; VMT vitreomacular traction; MH Macular hole;  NVD neovascularization of the disc; NVE neovascularization elsewhere; AREDS age related eye disease study; ARMD age related macular degeneration; POAG primary open angle glaucoma; EBMD epithelial/anterior basement membrane dystrophy; ACIOL anterior chamber intraocular lens; IOL intraocular lens; PCIOL posterior chamber intraocular lens; Phaco/IOL phacoemulsification with intraocular lens placement; [place] photorefractive keratectomy; LASIK laser assisted in situ keratomileusis; HTN hypertension; DM diabetes mellitus; COPD chronic obstructive pulmonary disease

## 2021-05-08 NOTE — Progress Notes (Signed)
[facility] Note  05/08/2021     CHIEF COMPLAINT Patient presents for Retina Follow Up  HISTORY OF PRESENT ILLNESS: Douglas Cox is a 70 y.o. male who presents to the clinic today for:   HPI     Retina Follow Up   Patient presents with  Retinal Break/Detachment.  In right eye.  Severity is mild.  Duration of 2 days.  Since onset it is stable.  I, the attending physician,  performed the HPI with the patient and updated documentation appropriately.        Comments   Pt here for 2 day ret f/u for RD OD. Pt states vision is about the same, no ocular pain or discomfort.       Last edited by Bernarda Caffey, MD on 05/10/2021  2:12 AM.    Pt states the gas bubble is getting smaller, vision is the same, pt complains of fol OD  Referring physician: No referring provider defined for this encounter.  HISTORICAL INFORMATION:   Selected notes from the [facility] for eval of increased floaters/veil OS   CURRENT MEDICATIONS: Current Outpatient Medications (Ophthalmic Drugs)  Medication Sig   neomycin-polymyxin-dexameth (MAXITROL) 0.1 % OINT Place 1 application into the right eye 4 (four) times daily for 14 days. (Patient not taking: Reported on 04/30/2021)   No current facility-administered medications for this visit. (Ophthalmic Drugs)   Current Outpatient Medications (Other)  Medication Sig   acidophilus (RISAQUAD) CAPS capsule Take 1 capsule by mouth daily. Align once daily   cholecalciferol (VITAMIN D3) 25 MCG (1000 UNIT) tablet Take 1,000 Units by mouth daily.   Cyanocobalamin 1000 MCG/ML KIT Inject 1 mL as directed. Every 2 weeks   psyllium (METAMUCIL) 58.6 % powder Take 1 packet by mouth daily.   No current facility-administered medications for this visit. (Other)   REVIEW OF SYSTEMS: ROS   Positive for: Cardiovascular, Eyes Negative for: Constitutional, Gastrointestinal, Neurological, Skin, Genitourinary,  Musculoskeletal, HENT, Endocrine, Respiratory, Psychiatric, Allergic/Imm, Heme/Lymph Last edited by Kingsley Spittle, COT on 05/08/2021  9:47 AM.      ALLERGIES Allergies  Allergen Reactions   Codone [Hydrocodone] Hives   Clarithromycin Anxiety    PAST MEDICAL HISTORY Past Medical History:  Diagnosis Date   Celiac disease    Retinal detachment    Past Surgical History:  Procedure Laterality Date   CATARACT EXTRACTION Bilateral    Dr. Valetta Close   EYE SURGERY     FRACTURE SURGERY     HERNIA REPAIR     RETINAL DETACHMENT SURGERY     FAMILY HISTORY History reviewed. No pertinent family history.  SOCIAL HISTORY Social History   Tobacco Use   Smoking status: Former    Types: Cigarettes    Quit date: 01/30/2006    Years since quitting: 15.2   Smokeless tobacco: Never  Vaping Use   Vaping Use: Never used  Substance Use Topics   Alcohol use: Yes    Alcohol/week: 4.0 - 5.0 standard drinks    Types: 4 - 5 Shots of liquor per week    Comment: 3 glasses of whiskey per night.   Drug use: Yes    Frequency: 7.0 times per week    Types: Marijuana       OPHTHALMIC EXAM:  Base Eye Exam     Visual Acuity (Snellen - Linear)       Right Left   Dist [place] 20/20 -2  Tonometry (Tonopen, 9:51 AM)       Right Left   Pressure 12          Pupils       Dark Light Shape React APD   Right 3 2 Round Minimal None   Left 3 2 Round Brisk None         Visual Fields (Counting fingers)       Left Right    Full Full         Extraocular Movement       Right Left    Full, Ortho Full, Ortho         Neuro/Psych     Oriented x3: Yes   Mood/Affect: Normal         Dilation     Right eye: 1.0% Mydriacyl, 2.5% Phenylephrine @ 9:51 AM           Slit Lamp and Fundus Exam     Slit Lamp Exam       Right Left   Lids/Lashes Dermatochalasis - upper lid Dermatochalasis - upper lid, mild Meibomian gland dysfunction   Conjunctiva/Sclera Subconjunctival  hemorrhage - improving, pneumatic Chemosis White and quiet   Cornea arcus, well healed temporal cataract wounds, 2+PEE arcus, well healed temporal cataract wounds   Anterior Chamber Deep and quiet Deep and quiet   Iris Round and dilated Round and dilated   Lens PC IOL in good position, trace Posterior capsular opacification PC IOL in good position, trace Posterior capsular opacification   Vitreous Vitreous syneresis, prominent vitreous condensations settled inferiorly, +pigment, Posterior vitreous detachment; single gas bubble about 20-25%  Vitreous syneresis, no pigment, Posterior vitreous detachment, Weiss ring, prominent linear vitreous condensation in IN quad         Fundus Exam       Right Left   Disc Pink and Sharp, Compact Pink and Sharp, Compact, mild tilt   C/D Ratio 0.1 0.2   Macula Flat, good foveal reflex, trace ERM, mild RPE mottling, No heme or edema, shallow SRF just within IT arcades Flat, Blunted foveal reflex, mild RPE mottling, No heme or edema   Vessels mild attenuation, mild tortuousity mild attenuation, mild tortuousity   Periphery Bullous nasal RD from 3500-9381 with small HST at 0130; interval improvement in superonasal SRF -- retina reattaching; residual SRF inferiorly--mild shifting inf temporal, good early cryo changes around 0130 tear Attached, mild pigmented paving stone degeneration IT quad, No heme , No RT/RD           Refraction     Wearing Rx       Sphere Cylinder Axis Add   Right +0.25 Sphere  +2.50   Left -0.50 +0.75 117 +2.50           IMAGING AND PROCEDURES  Imaging and Procedures for 05/08/2021  OCT, Retina - OU - Both Eyes       Right Eye Quality was good. Central Foveal Thickness: 294. Progression has improved. Findings include normal foveal contour, no IRF, vitreomacular adhesion , subretinal fluid (stable improvement in superonasal SRF -- retina reattaching; residual SRF inferior, persistent).   Left Eye Quality was good. Central  Foveal Thickness: 271. Progression has been stable. Findings include normal foveal contour, no IRF, no SRF (Mild vitreous opacities).   Notes *Images captured and stored on drive  Diagnosis / Impression:  OD: NFP, no IRF; Interval improvement in superonasal SRF -- retina reattaching; residual SRF inferior--mild inf temporal shift - persistent OS: NFP, no IRF/SRF  Clinical management:  See below  Abbreviations: NFP - Normal foveal profile. CME - cystoid macular edema. PED - pigment epithelial detachment. IRF - intraretinal fluid. SRF - subretinal fluid. EZ - ellipsoid zone. ERM - epiretinal membrane. ORA - outer retinal atrophy. ORT - outer retinal tubulation. SRHM - subretinal hyper-reflective material. IRHM - intraretinal hyper-reflective material             ASSESSMENT/PLAN:    ICD-10-CM   1. Right retinal detachment  H33.21     2. Retinal edema  H35.81 OCT, Retina - OU - Both Eyes    3. Posterior vitreous detachment of left eye  H43.812     4. Pseudophakia, both eyes  Z96.1      1,2. 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 PVD OS  - acute development of prominent floaters OS -- saw Dr. Katy Fitch on call on Saturday, 10.16.21  - Discussed findings and prognosis  - No RT or RD on 360 scleral depressed exam  - Reviewed s/s of RT/RD  - Strict return precautions for any such RT/RD signs/symptoms    - f/u in 3-4 wks -- DFE/OCT  4. Pseudophakia OU  - s/p CE/IOL OU (Dr. Valetta Close)  - IOLs in good position, doing well  - monitor  Ophthalmic Meds Ordered this visit:  No orders of the defined types were placed in this encounter.    Return for f/u Monday or Tuesday RD OD, DFE, OCT.  There are no Patient Instructions on file for this visit.   Explained the diagnoses, plan, and follow up with the patient and they expressed understanding.  Patient expressed understanding of the importance of proper follow up care.    This document serves as a record of services personally performed by Gardiner Sleeper, MD, PhD. It was created on their behalf by Orvan Falconer, an ophthalmic technician. The creation of this record is the provider's dictation and/or activities during the visit.    Electronically signed by: Orvan Falconer, OA, 05/10/21  2:15 AM   This document serves as a record of services personally performed by Gardiner Sleeper, MD, PhD. It was created on their behalf by San Jetty. Owens Shark, OA an ophthalmic technician. The creation of this record is the provider's dictation and/or activities during the visit.    Electronically signed by: San Jetty. [place], [place] 10.05.2022 2:15 AM   Gardiner Sleeper, M.D., Ph.D. Diseases & Surgery of the Retina and Vitreous [facility]  I have reviewed the above documentation for accuracy and completeness, and I agree with the above. Gardiner Sleeper, M.D., Ph.D. 05/10/21 2:15 AM   Abbreviations: M myopia (nearsighted); A astigmatism; H hyperopia (farsighted); P presbyopia; Mrx spectacle prescription;  CTL contact lenses; OD right eye; OS left eye; OU both eyes  XT exotropia; ET esotropia; PEK punctate epithelial keratitis; PEE punctate epithelial erosions; DES dry eye syndrome; MGD meibomian gland dysfunction; ATs artificial tears; PFAT's  preservative free artificial tears; [place] nuclear sclerotic cataract; PSC posterior subcapsular  cataract; ERM epi-retinal membrane; PVD posterior vitreous detachment; RD retinal detachment; DM diabetes mellitus; DR diabetic retinopathy; NPDR non-proliferative diabetic retinopathy; PDR proliferative diabetic retinopathy; CSME clinically significant macular edema; DME diabetic macular edema; dbh dot blot hemorrhages; CWS cotton wool spot; POAG primary open angle glaucoma; C/D cup-to-disc ratio; HVF humphrey visual field; GVF goldmann visual field; OCT optical coherence tomography; IOP intraocular pressure; BRVO Branch retinal vein occlusion; CRVO central retinal vein occlusion; CRAO central retinal artery occlusion; BRAO branch retinal artery occlusion; RT retinal tear; SB scleral buckle; PPV pars plana vitrectomy; VH Vitreous hemorrhage; PRP panretinal laser photocoagulation; IVK intravitreal kenalog; VMT vitreomacular traction; MH Macular hole;  NVD neovascularization of the disc; NVE neovascularization elsewhere; AREDS age related eye disease study; ARMD age related macular degeneration; POAG primary open angle glaucoma; EBMD epithelial/anterior basement membrane dystrophy; ACIOL anterior chamber intraocular lens; IOL intraocular lens; PCIOL posterior chamber intraocular lens; Phaco/IOL phacoemulsification with intraocular lens placement; [place] photorefractive keratectomy; LASIK laser assisted in situ keratomileusis; HTN hypertension; DM diabetes mellitus; COPD chronic obstructive pulmonary disease

## 2021-05-10 ENCOUNTER — Encounter (INDEPENDENT_AMBULATORY_CARE_PROVIDER_SITE_OTHER): Payer: Self-pay | Admitting: Ophthalmology

## 2021-05-11 ENCOUNTER — Ambulatory Visit (INDEPENDENT_AMBULATORY_CARE_PROVIDER_SITE_OTHER): Payer: Medicare Other | Admitting: Ophthalmology

## 2021-05-11 ENCOUNTER — Encounter (INDEPENDENT_AMBULATORY_CARE_PROVIDER_SITE_OTHER): Payer: Self-pay | Admitting: Ophthalmology

## 2021-05-11 ENCOUNTER — Other Ambulatory Visit: Payer: Self-pay

## 2021-05-11 DIAGNOSIS — H43812 Vitreous degeneration, left eye: Secondary | ICD-10-CM

## 2021-05-11 DIAGNOSIS — H3581 Retinal edema: Secondary | ICD-10-CM

## 2021-05-11 DIAGNOSIS — H3321 Serous retinal detachment, right eye: Secondary | ICD-10-CM | POA: Diagnosis not present

## 2021-05-11 DIAGNOSIS — Z961 Presence of intraocular lens: Secondary | ICD-10-CM

## 2021-05-13 NOTE — Progress Notes (Signed)
[facility] Note  05/14/2021     CHIEF COMPLAINT Patient presents for Post-op Follow-up  HISTORY OF PRESENT ILLNESS: Douglas Cox is a 70 y.o. male who presents to the clinic today for:   HPI     Post-op Follow-up   In right eye.  Discomfort includes pain.  Vision is stable.  I, the attending physician,  performed the HPI with the patient and updated documentation appropriately.        Comments   Pt states vision is stable OD.  States eye aches on occasion.  Denies new or worsening floaters or fol.      Last edited by Bernarda Caffey, MD on 05/14/2021  9:53 PM.     Pt states no changes in vision  Referring physician: No referring provider defined for this encounter.  HISTORICAL INFORMATION:   Selected notes from the [facility] for eval of increased floaters/veil OS   CURRENT MEDICATIONS: No current outpatient medications on file. (Ophthalmic Drugs)   No current facility-administered medications for this visit. (Ophthalmic Drugs)   Current Outpatient Medications (Other)  Medication Sig   acidophilus (RISAQUAD) CAPS capsule Take 1 capsule by mouth daily. Align once daily   cholecalciferol (VITAMIN D3) 25 MCG (1000 UNIT) tablet Take 1,000 Units by mouth daily.   Cyanocobalamin 1000 MCG/ML KIT Inject 1 mL as directed. Every 2 weeks   psyllium (METAMUCIL) 58.6 % powder Take 1 packet by mouth daily.   No current facility-administered medications for this visit. (Other)   REVIEW OF SYSTEMS: ROS   Positive for: Cardiovascular, Eyes Negative for: Constitutional, Gastrointestinal, Neurological, Skin, Genitourinary, Musculoskeletal, HENT, Endocrine, Respiratory, Psychiatric, Allergic/Imm, Heme/Lymph Last edited by Doneen Poisson on 05/14/2021  7:59 AM.      ALLERGIES Allergies  Allergen Reactions   Codone [Hydrocodone] Hives   Clarithromycin Anxiety   PAST MEDICAL HISTORY Past Medical History:   Diagnosis Date   Celiac disease    Retinal detachment    Past Surgical History:  Procedure Laterality Date   CATARACT EXTRACTION Bilateral    Dr. Valetta Close   EYE SURGERY     FRACTURE SURGERY     HERNIA REPAIR     RETINAL DETACHMENT SURGERY     FAMILY HISTORY History reviewed. No pertinent family history.  SOCIAL HISTORY Social History   Tobacco Use   Smoking status: Former    Types: Cigarettes    Quit date: 01/30/2006    Years since quitting: 15.2   Smokeless tobacco: Never  Vaping Use   Vaping Use: Never used  Substance Use Topics   Alcohol use: Yes    Alcohol/week: 4.0 - 5.0 standard drinks    Types: 4 - 5 Shots of liquor per week    Comment: 3 glasses of whiskey per night.   Drug use: Yes    Frequency: 7.0 times per week    Types: Marijuana       OPHTHALMIC EXAM:  Base Eye Exam     Visual Acuity (Snellen - Linear)       Right Left   Dist [place] 20/20 -2 20/20         Tonometry (Tonopen, 8:40 AM)       Right Left   Pressure 12          Extraocular Movement       Right Left    Full Full         Neuro/Psych     Oriented x3: Yes  Mood/Affect: Normal         Dilation     Right eye: 1.0% Mydriacyl, 2.5% Phenylephrine @ 8:40 AM           Slit Lamp and Fundus Exam     Slit Lamp Exam       Right Left   Lids/Lashes Dermatochalasis - upper lid Dermatochalasis - upper lid, mild Meibomian gland dysfunction   Conjunctiva/Sclera Subconjunctival hemorrhage - improving, pneumatic Chemosis White and quiet   Cornea arcus, well healed temporal cataract wounds, 1-2+PEE arcus, well healed temporal cataract wounds   Anterior Chamber Deep and quiet Deep and quiet   Iris Round and dilated Round and dilated   Lens PC IOL in good position, trace Posterior capsular opacification PC IOL in good position, trace Posterior capsular opacification   Vitreous Vitreous syneresis, prominent vitreous condensations settled inferiorly, +pigment, Posterior vitreous  detachment; single gas bubble about 10-15%  Vitreous syneresis, no pigment, Posterior vitreous detachment, Weiss ring, prominent linear vitreous condensation in IN quad         Fundus Exam       Right Left   Disc Pink and Sharp, Compact Pink and Sharp, Compact, mild tilt   C/D Ratio 0.2 0.2   Macula Flat, good foveal reflex, trace ERM, mild RPE mottling, No heme or edema Flat, Blunted foveal reflex, mild RPE mottling, No heme or edema   Vessels mild attenuation, mild tortuousity mild attenuation, mild tortuousity   Periphery Bullous nasal RD from 4098-1191 with small HST at 0130; interval improvement in superonasal SRF -- retina reattaching; residual SRF inferiorly--improving, cryo changes around 0130 tear -- not yet fully mature Attached, mild pigmented paving stone degeneration IT quad, No heme , No RT/RD           Refraction     Wearing Rx       Sphere Cylinder Axis Add   Right +0.25 Sphere  +2.50   Left -0.50 +0.75 117 +2.50           IMAGING AND PROCEDURES  Imaging and Procedures for 05/14/2021  OCT, Retina - OU - Both Eyes       Right Eye Quality was good. Central Foveal Thickness: 285. Progression has improved. Findings include normal foveal contour, no IRF, vitreomacular adhesion , subretinal fluid (stable improvement in superonasal SRF -- retina reattaching; residual SRF infereriorly -- improving).   Left Eye Quality was good. Central Foveal Thickness: 267. Progression has been stable. Findings include normal foveal contour, no IRF, no SRF (Mild vitreous opacities).   Notes *Images captured and stored on drive  Diagnosis / Impression:  OD: NFP, no IRF; stable improvement in superonasal SRF -- retina reattaching; residual SRF infereriorly -- significant improvement from prior OS: NFP, no IRF/SRF  Clinical management:  See below  Abbreviations: NFP - Normal foveal profile. CME - cystoid macular edema. PED - pigment epithelial detachment. IRF - intraretinal  fluid. SRF - subretinal fluid. EZ - ellipsoid zone. ERM - epiretinal membrane. ORA - outer retinal atrophy. ORT - outer retinal tubulation. SRHM - subretinal hyper-reflective material. IRHM - intraretinal hyper-reflective material             ASSESSMENT/PLAN:   ICD-10-CM   1. Right retinal detachment  H33.21     2. Retinal edema  H35.81 OCT, Retina - OU - Both Eyes    3. Posterior vitreous detachment of left eye  H43.812     4. Pseudophakia, both eyes  Z96.1     1,2. 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, onset 09.25.22 per pt history - detached from 1230 to 0630, fovea on, small tear at 130 periphery - s/p pneumatic retinopexy OD, 09.27.22 - pt doing well with positioning - BCVA 20/20, stable - OCT today shows: stable improvement in superonasal SRF -- retina reattaching; residual SRF infereriorly -- improving - Significant improvement in SRF   - okay to stop Maxitrol ointment  - reviewed post op positioning instructions with pt and wife - discussed possible need for more gas or laser - discussed possible need for PPV OD if retina fails to reattach completely - f/u Monday, DFE OD, widefield OCT looking down and to the left  3. PVD OS  - acute development of prominent floaters OS -- saw Dr. Katy Fitch on call on Saturday, 10.16.21  - Discussed findings and prognosis  - No RT or RD on 360 scleral depressed exam  - Reviewed s/s of RT/RD  - Strict return precautions for any such RT/RD signs/symptoms   - f/u in 3-4 wks -- DFE/OCT  4. Pseudophakia OU  - s/p CE/IOL OU (Dr. Valetta Close)  - IOLs in good position, doing well  - monitor  Ophthalmic Meds Ordered this visit:  No orders of the defined types were placed in this encounter.    Return for Monday, DFE, OCT- widefield looking down and to the left.  There are no Patient Instructions on file for this visit.  This document serves as a record of services personally performed by Gardiner Sleeper,  MD, PhD. It was created on their behalf by San Jetty. Owens Shark, OA an ophthalmic technician. The creation of this record is the provider's dictation and/or activities during the visit.    Electronically signed by: San Jetty. Owens Shark, [place] 10.12.2022 10:03 PM  This document serves as a record of services personally performed by Gardiner Sleeper, MD, PhD. It was created on their behalf by Leonie Douglas, an ophthalmic technician. The creation of this record is the provider's dictation and/or activities during the visit.    Electronically signed by: Leonie Douglas COA, 05/14/21  10:03 PM  Gardiner Sleeper, M.D., Ph.D. Diseases & Surgery of the Retina and Vitreous [facility]  I have reviewed the above documentation for accuracy and completeness, and I agree with the above. Gardiner Sleeper, M.D., Ph.D. 05/14/21 10:03 PM  Abbreviations: M myopia (nearsighted); A astigmatism; H hyperopia (farsighted); P presbyopia; Mrx spectacle prescription;  CTL contact lenses; OD right eye; OS left eye; OU both eyes  XT exotropia; ET esotropia; PEK punctate epithelial keratitis; PEE punctate epithelial erosions; DES dry eye syndrome; MGD meibomian gland dysfunction; ATs artificial tears; PFAT's preservative free artificial tears; [place] nuclear sclerotic cataract; PSC posterior subcapsular cataract; ERM epi-retinal membrane; PVD posterior vitreous detachment; RD retinal detachment; DM diabetes mellitus; DR diabetic retinopathy; NPDR non-proliferative diabetic retinopathy; PDR proliferative diabetic retinopathy; CSME clinically significant macular edema; DME diabetic macular edema; dbh dot blot hemorrhages; CWS cotton wool spot; POAG primary open angle glaucoma; C/D cup-to-disc ratio; HVF humphrey visual field; GVF goldmann visual field; OCT optical coherence tomography; IOP intraocular pressure; BRVO Branch retinal vein occlusion; CRVO central retinal vein occlusion; CRAO central retinal artery occlusion; BRAO  branch retinal artery occlusion; RT retinal tear; SB scleral buckle; PPV pars plana vitrectomy; VH Vitreous hemorrhage; PRP panretinal laser photocoagulation; IVK intravitreal kenalog; VMT vitreomacular traction; MH Macular hole;  NVD neovascularization of the disc; NVE neovascularization elsewhere; AREDS age related eye disease study; ARMD  age related macular degeneration; POAG primary open angle glaucoma; EBMD epithelial/anterior basement membrane dystrophy; ACIOL anterior chamber intraocular lens; IOL intraocular lens; PCIOL posterior chamber intraocular lens; Phaco/IOL phacoemulsification with intraocular lens placement; [place] photorefractive keratectomy; LASIK laser assisted in situ keratomileusis; HTN hypertension; DM diabetes mellitus; COPD chronic obstructive pulmonary disease

## 2021-05-14 ENCOUNTER — Other Ambulatory Visit: Payer: Self-pay

## 2021-05-14 ENCOUNTER — Encounter (INDEPENDENT_AMBULATORY_CARE_PROVIDER_SITE_OTHER): Payer: Self-pay | Admitting: Ophthalmology

## 2021-05-14 ENCOUNTER — Ambulatory Visit (INDEPENDENT_AMBULATORY_CARE_PROVIDER_SITE_OTHER): Payer: Medicare Other | Admitting: Ophthalmology

## 2021-05-14 DIAGNOSIS — H3581 Retinal edema: Secondary | ICD-10-CM

## 2021-05-14 DIAGNOSIS — Z961 Presence of intraocular lens: Secondary | ICD-10-CM

## 2021-05-14 DIAGNOSIS — H3321 Serous retinal detachment, right eye: Secondary | ICD-10-CM | POA: Diagnosis not present

## 2021-05-14 DIAGNOSIS — H43812 Vitreous degeneration, left eye: Secondary | ICD-10-CM

## 2021-05-15 ENCOUNTER — Encounter (INDEPENDENT_AMBULATORY_CARE_PROVIDER_SITE_OTHER): Payer: Medicare Other | Admitting: Ophthalmology

## 2021-05-18 ENCOUNTER — Encounter (INDEPENDENT_AMBULATORY_CARE_PROVIDER_SITE_OTHER): Payer: Self-pay | Admitting: Ophthalmology

## 2021-05-18 ENCOUNTER — Ambulatory Visit (INDEPENDENT_AMBULATORY_CARE_PROVIDER_SITE_OTHER): Payer: Medicare Other | Admitting: Ophthalmology

## 2021-05-18 ENCOUNTER — Other Ambulatory Visit: Payer: Self-pay

## 2021-05-18 DIAGNOSIS — H3581 Retinal edema: Secondary | ICD-10-CM

## 2021-05-18 DIAGNOSIS — H3321 Serous retinal detachment, right eye: Secondary | ICD-10-CM | POA: Diagnosis not present

## 2021-05-18 DIAGNOSIS — H43812 Vitreous degeneration, left eye: Secondary | ICD-10-CM

## 2021-05-18 DIAGNOSIS — Z961 Presence of intraocular lens: Secondary | ICD-10-CM

## 2021-05-18 NOTE — Progress Notes (Addendum)
[facility] Note  05/18/2021     CHIEF COMPLAINT Patient presents for Retina Follow Up  HISTORY OF PRESENT ILLNESS: Douglas Cox is a 70 y.o. male who presents to the clinic today for:   HPI     Retina Follow Up   Patient presents with  Retinal Break/Detachment.  In right eye.  This started 4 days ago.  I, the attending physician,  performed the HPI with the patient and updated documentation appropriately.        Comments   Patient here for 4 days retina follow up for RD OD. Patient states vision doing fine. No eye pain.       Last edited by Bernarda Caffey, MD on 05/18/2021 10:19 AM.    Pt states no changes in vision  Referring physician: No referring provider defined for this encounter.  HISTORICAL INFORMATION:  Selected notes from the [facility] for eval of increased floaters/veil OS   CURRENT MEDICATIONS: No current outpatient medications on file. (Ophthalmic Drugs)   No current facility-administered medications for this visit. (Ophthalmic Drugs)   Current Outpatient Medications (Other)  Medication Sig   acidophilus (RISAQUAD) CAPS capsule Take 1 capsule by mouth daily. Align once daily   cholecalciferol (VITAMIN D3) 25 MCG (1000 UNIT) tablet Take 1,000 Units by mouth daily.   Cyanocobalamin 1000 MCG/ML KIT Inject 1 mL as directed. Every 2 weeks   psyllium (METAMUCIL) 58.6 % powder Take 1 packet by mouth daily.   No current facility-administered medications for this visit. (Other)   REVIEW OF SYSTEMS: ROS   Positive for: Cardiovascular, Eyes Negative for: Constitutional, Gastrointestinal, Neurological, Skin, Genitourinary, Musculoskeletal, HENT, Endocrine, Respiratory, Psychiatric, Allergic/Imm, Heme/Lymph Last edited by Theodore Demark, COA on 05/18/2021  8:16 AM.     ALLERGIES Allergies  Allergen Reactions   Codone [Hydrocodone] Hives   Clarithromycin Anxiety   PAST MEDICAL HISTORY Past  Medical History:  Diagnosis Date   Celiac disease    Retinal detachment    Past Surgical History:  Procedure Laterality Date   CATARACT EXTRACTION Bilateral    Dr. Valetta Close   EYE SURGERY     FRACTURE SURGERY     HERNIA REPAIR     RETINAL DETACHMENT SURGERY     FAMILY HISTORY History reviewed. No pertinent family history.  SOCIAL HISTORY Social History   Tobacco Use   Smoking status: Former    Types: Cigarettes    Quit date: 01/30/2006    Years since quitting: 15.3   Smokeless tobacco: Never  Vaping Use   Vaping Use: Never used  Substance Use Topics   Alcohol use: Yes    Alcohol/week: 4.0 - 5.0 standard drinks    Types: 4 - 5 Shots of liquor per week    Comment: 3 glasses of whiskey per night.   Drug use: Yes    Frequency: 7.0 times per week    Types: Marijuana       OPHTHALMIC EXAM: Base Eye Exam     Visual Acuity (Snellen - Linear)       Right Left   Dist [place] 20/20 -2 20/20         Tonometry (Tonopen, 8:13 AM)       Right Left   Pressure 12 def         Pupils       Dark Light Shape React APD   Right 3 2 Round Minimal None   Left 3 2 Round Brisk None  Visual Fields (Counting fingers)       Left Right    Full Full         Extraocular Movement       Right Left    Full, Ortho Full, Ortho         Neuro/Psych     Oriented x3: Yes   Mood/Affect: Normal         Dilation     Right eye: 1.0% Mydriacyl, 2.5% Phenylephrine @ 8:13 AM           Slit Lamp and Fundus Exam     Slit Lamp Exam       Right Left   Lids/Lashes Dermatochalasis - upper lid Dermatochalasis - upper lid, mild Meibomian gland dysfunction   Conjunctiva/Sclera Subconjunctival hemorrhage - improving, pneumatic Chemosis White and quiet   Cornea arcus, well healed temporal cataract wounds, trace PEE arcus, well healed temporal cataract wounds   Anterior Chamber Deep and quiet Deep and quiet   Iris Round and dilated Round and dilated   Lens PC IOL in good  position, trace Posterior capsular opacification PC IOL in good position, trace Posterior capsular opacification   Vitreous Vitreous syneresis, prominent vitreous condensations settled inferiorly, +pigment, Posterior vitreous detachment; single gas bubble about ~5% , vitreous debris Vitreous syneresis, no pigment, Posterior vitreous detachment, Weiss ring, prominent linear vitreous condensation in IN quad         Fundus Exam       Right Left   Disc Pink and Sharp, Compact Pink and Sharp, Compact, mild tilt   C/D Ratio 0.2 0.2   Macula Flat, good foveal reflex, trace ERM, mild RPE mottling, No heme or edema Flat, Blunted foveal reflex, mild RPE mottling, No heme or edema   Vessels mild attenuation, mild tortuousity mild attenuation, mild tortuousity   Periphery Bullous nasal RD from 1230-0630 with small HST at 0130; interval improvement in superonasal SRF -- retina reattaching; residual SRF inferiorly--improving, cryo changes around 0130 tear -- not yet fully mature Attached, mild pigmented paving stone degeneration IT quad, No heme , No RT/RD           Refraction     Wearing Rx       Sphere Cylinder Axis Add   Right +0.25 Sphere  +2.50   Left -0.50 +0.75 117 +2.50           IMAGING AND PROCEDURES  Imaging and Procedures for 05/18/2021  OCT, Retina - OU - Both Eyes       Right Eye Quality was good. Central Foveal Thickness: 293. Progression has improved. Findings include normal foveal contour, no IRF, vitreomacular adhesion , subretinal fluid (stable improvement in superonasal SRF -- retina reattaching; residual SRF infereriorly -- improving).   Left Eye Quality was good. Central Foveal Thickness: 268. Progression has been stable. Findings include normal foveal contour, no IRF, no SRF (Mild vitreous opacities).   Notes *Images captured and stored on drive  Diagnosis / Impression:  OD: NFP, no IRF; stable improvement in superonasal SRF -- retina reattaching; residual  SRF infereriorly -- improved from prior OS: NFP, no IRF/SRF  Clinical management:  See below  Abbreviations: NFP - Normal foveal profile. CME - cystoid macular edema. PED - pigment epithelial detachment. IRF - intraretinal fluid. SRF - subretinal fluid. EZ - ellipsoid zone. ERM - epiretinal membrane. ORA - outer retinal atrophy. ORT - outer retinal tubulation. SRHM - subretinal hyper-reflective material. IRHM - intraretinal hyper-reflective material     LASER PROCEDURE  NOTE  Procedure:  Supplemental Barrier laser retinopexy using slit lamp laser, RIGHT eye   Diagnosis:   Retinal detachment w/ single tear, RIGHT eye                     Small flap tear w/ cryo at 130 o'clock anterior to equator   Surgeon: Bernarda Caffey, MD, PhD  Anesthesia: Topical  Informed consent obtained, operative eye marked, and time out performed prior to initiation of laser.   Laser settings:  Lumenis [id number] laser, slit lamp Lens: [place] PRP 165 Power: 300 mW Spot size: 200 microns Duration: 30 msec  # spots: 98  Placement of laser: Using a [place] PRP 165 contact lens at the slit lamp, laser was placed in three confluent rows around flap tear w/ cryo chages at 130 oclock anterior to equator with additional rows anteriorly.  Complications: None.  Patient tolerated the procedure well and received written and verbal post-procedure care information/education.        ASSESSMENT/PLAN:   ICD-10-CM   1. Right retinal detachment  H33.21 CANCELED: Repair Retinal Breaks, Laser - OD - Right Eye    2. Retinal edema  H35.81 OCT, Retina - OU - Both Eyes    3. Posterior vitreous detachment of left eye  H43.812     4. Pseudophakia, both eyes  Z96.1     1,2. 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, onset 09.25.22 per pt history - detached from 1230 to 0630, fovea on, small tear at 130 periphery - s/p pneumatic retinopexy OD, 09.27.22 - pt doing well with positioning -  BCVA 20/20, stable - OCT today shows: stable improvement in superonasal SRF -- retina reattaching; residual SRF inferiorly -- improving  - gas bubble ~5% - reviewed post op positioning instructions with pt and wife - discussed possible need for more gas or laser - discussed possible need for PPV OD if retina fails to reattach completely - recommend laser retinopexy OD today, 10.17.22 to supplement cryo - pt wishes to proceed with laser - RBA of procedure discussed, questions answered - informed consent obtained and signed - see procedure note - start Lotemax QID OD x7 days -- sample given - f/u Thursday, DFE OD, widefield OCT looking down and to the left  3. PVD OS  - acute development of prominent floaters OS -- saw Dr. Katy Fitch on call on Saturday, 10.16.21  - Discussed findings and prognosis  - No RT or RD on 360 scleral depressed exam  - Reviewed s/s of RT/RD  - Strict return precautions for any such RT/RD signs/symptoms   - f/u in 3-4 wks -- DFE/OCT  4. Pseudophakia OU  - s/p CE/IOL OU (Dr. Valetta Close)  - IOLs in good position, doing well  - monitor  Ophthalmic Meds Ordered this visit:  No orders of the defined types were placed in this encounter.    Return in about 3 days (around 05/21/2021) for POV.  There are no Patient Instructions on file for this visit.  This document serves as a record of services personally performed by Gardiner Sleeper, MD, PhD. It was created on their behalf by San Jetty. Owens Shark, OA an ophthalmic technician. The creation of this record is the provider's dictation and/or activities during the visit.    Electronically signed by: San Jetty. [place], [place] 10.17.2022 1:12 PM  Gardiner Sleeper, M.D., Ph.D. Diseases & Surgery of the Retina and Vitreous [facility]  I have  reviewed the above documentation for accuracy and completeness, and I agree with the above. Gardiner Sleeper, M.D., Ph.D. 05/18/21 1:12 PM   Abbreviations: M myopia  (nearsighted); A astigmatism; H hyperopia (farsighted); P presbyopia; Mrx spectacle prescription;  CTL contact lenses; OD right eye; OS left eye; OU both eyes  XT exotropia; ET esotropia; PEK punctate epithelial keratitis; PEE punctate epithelial erosions; DES dry eye syndrome; MGD meibomian gland dysfunction; ATs artificial tears; PFAT's preservative free artificial tears; [place] nuclear sclerotic cataract; PSC posterior subcapsular cataract; ERM epi-retinal membrane; PVD posterior vitreous detachment; RD retinal detachment; DM diabetes mellitus; DR diabetic retinopathy; NPDR non-proliferative diabetic retinopathy; PDR proliferative diabetic retinopathy; CSME clinically significant macular edema; DME diabetic macular edema; dbh dot blot hemorrhages; CWS cotton wool spot; POAG primary open angle glaucoma; C/D cup-to-disc ratio; HVF humphrey visual field; GVF goldmann visual field; OCT optical coherence tomography; IOP intraocular pressure; BRVO Branch retinal vein occlusion; CRVO central retinal vein occlusion; CRAO central retinal artery occlusion; BRAO branch retinal artery occlusion; RT retinal tear; SB scleral buckle; PPV pars plana vitrectomy; VH Vitreous hemorrhage; PRP panretinal laser photocoagulation; IVK intravitreal kenalog; VMT vitreomacular traction; MH Macular hole;  NVD neovascularization of the disc; NVE neovascularization elsewhere; AREDS age related eye disease study; ARMD age related macular degeneration; POAG primary open angle glaucoma; EBMD epithelial/anterior basement membrane dystrophy; ACIOL anterior chamber intraocular lens; IOL intraocular lens; PCIOL posterior chamber intraocular lens; Phaco/IOL phacoemulsification with intraocular lens placement; [place] photorefractive keratectomy; LASIK laser assisted in situ keratomileusis; HTN hypertension; DM diabetes mellitus; COPD chronic obstructive pulmonary disease

## 2021-05-21 ENCOUNTER — Ambulatory Visit (INDEPENDENT_AMBULATORY_CARE_PROVIDER_SITE_OTHER): Payer: Medicare Other | Admitting: Ophthalmology

## 2021-05-21 ENCOUNTER — Ambulatory Visit (HOSPITAL_COMMUNITY)
Admission: AD | Admit: 2021-05-21 | Discharge: 2021-05-21 | Disposition: A | Discharge status: Home / Self Care | Payer: Medicare Other | Source: Ambulatory Visit | Attending: Ophthalmology | Admitting: Ophthalmology

## 2021-05-21 ENCOUNTER — Encounter (HOSPITAL_COMMUNITY): Payer: Self-pay | Admitting: Ophthalmology

## 2021-05-21 ENCOUNTER — Ambulatory Visit (HOSPITAL_COMMUNITY): Payer: Medicare Other | Admitting: Anesthesiology

## 2021-05-21 ENCOUNTER — Other Ambulatory Visit: Payer: Self-pay

## 2021-05-21 ENCOUNTER — Encounter (HOSPITAL_COMMUNITY)
Admission: AD | Disposition: A | Discharge status: Home / Self Care | Payer: Self-pay | Source: Ambulatory Visit | Attending: Ophthalmology

## 2021-05-21 ENCOUNTER — Encounter (INDEPENDENT_AMBULATORY_CARE_PROVIDER_SITE_OTHER): Payer: Self-pay | Admitting: Ophthalmology

## 2021-05-21 DIAGNOSIS — H3321 Serous retinal detachment, right eye: Secondary | ICD-10-CM

## 2021-05-21 DIAGNOSIS — Z87891 Personal history of nicotine dependence: Secondary | ICD-10-CM | POA: Insufficient documentation

## 2021-05-21 DIAGNOSIS — H33001 Unspecified retinal detachment with retinal break, right eye: Secondary | ICD-10-CM | POA: Insufficient documentation

## 2021-05-21 DIAGNOSIS — H43812 Vitreous degeneration, left eye: Secondary | ICD-10-CM

## 2021-05-21 DIAGNOSIS — H3581 Retinal edema: Secondary | ICD-10-CM

## 2021-05-21 DIAGNOSIS — Z961 Presence of intraocular lens: Secondary | ICD-10-CM

## 2021-05-21 HISTORY — PX: PARS PLANA VITRECTOMY: SHX2166

## 2021-05-21 HISTORY — PX: GAS INSERTION: SHX5336

## 2021-05-21 HISTORY — PX: PERFLUORONE INJECTION: SHX5302

## 2021-05-21 HISTORY — PX: GAS/FLUID EXCHANGE: SHX5334

## 2021-05-21 HISTORY — PX: PHOTOCOAGULATION WITH LASER: SHX6027

## 2021-05-21 SURGERY — PARS PLANA VITRECTOMY WITH 25 GAUGE
Anesthesia: General | Site: Eye | Laterality: Right

## 2021-05-21 MED ORDER — PROPOFOL 10 MG/ML IV BOLUS
INTRAVENOUS | Status: DC | PRN
  Administered 2021-05-21: 180 mg via INTRAVENOUS

## 2021-05-21 MED ORDER — CELECOXIB 200 MG PO CAPS
200.0000 mg | ORAL_CAPSULE | Freq: Once | ORAL | Status: AC
  Administered 2021-05-21: 200 mg via ORAL
  Filled 2021-05-21: qty 1

## 2021-05-21 MED ORDER — GATIFLOXACIN 0.5 % OP SOLN OPTIME - NO CHARGE
OPHTHALMIC | Status: DC | PRN
  Administered 2021-05-21: 1 [drp] via OPHTHALMIC

## 2021-05-21 MED ORDER — EPINEPHRINE PF 1 MG/ML IJ SOLN
INTRAMUSCULAR | Status: AC
  Filled 2021-05-21: qty 1

## 2021-05-21 MED ORDER — DEXAMETHASONE SODIUM PHOSPHATE 10 MG/ML IJ SOLN
INTRAMUSCULAR | Status: AC
  Filled 2021-05-21: qty 1

## 2021-05-21 MED ORDER — ROCURONIUM BROMIDE 10 MG/ML (PF) SYRINGE
PREFILLED_SYRINGE | INTRAVENOUS | Status: AC
  Filled 2021-05-21: qty 20

## 2021-05-21 MED ORDER — LACTATED RINGERS IV SOLN: INTRAVENOUS | Status: DC | PRN

## 2021-05-21 MED ORDER — PREDNISOLONE ACETATE 1 % OP SUSP
OPHTHALMIC | Status: AC
  Filled 2021-05-21: qty 5

## 2021-05-21 MED ORDER — FENTANYL CITRATE (PF) 100 MCG/2ML IJ SOLN
INTRAMUSCULAR | Status: AC
  Filled 2021-05-21: qty 2

## 2021-05-21 MED ORDER — STERILE WATER FOR INJECTION IJ SOLN
INTRAMUSCULAR | Status: DC | PRN
  Administered 2021-05-21: .5 mL

## 2021-05-21 MED ORDER — POLYMYXIN B SULFATE 500000 UNITS IJ SOLR
INTRAMUSCULAR | Status: AC
  Filled 2021-05-21: qty 500000

## 2021-05-21 MED ORDER — NA CHONDROIT SULF-NA HYALURON 40-30 MG/ML IO SOSY
INTRAOCULAR | Status: AC
  Filled 2021-05-21: qty 1

## 2021-05-21 MED ORDER — SODIUM CHLORIDE 0.9 % IV SOLN: INTRAVENOUS | Status: DC

## 2021-05-21 MED ORDER — PHENYLEPHRINE HCL 10 % OP SOLN
1.0000 [drp] | OPHTHALMIC | Status: AC | PRN
  Administered 2021-05-21 (×3): 1 [drp] via OPHTHALMIC
  Filled 2021-05-21: qty 5

## 2021-05-21 MED ORDER — STERILE WATER FOR INJECTION IJ SOLN
INTRAMUSCULAR | Status: AC
  Filled 2021-05-21: qty 10

## 2021-05-21 MED ORDER — CARBACHOL 0.01 % IO SOLN
INTRAOCULAR | Status: AC
  Filled 2021-05-21: qty 1.5

## 2021-05-21 MED ORDER — BACITRACIN-POLYMYXIN B 500-10000 UNIT/GM OP OINT
TOPICAL_OINTMENT | OPHTHALMIC | Status: DC | PRN
  Administered 2021-05-21: 1 via OPHTHALMIC

## 2021-05-21 MED ORDER — LIDOCAINE 2% (20 MG/ML) 5 ML SYRINGE
INTRAMUSCULAR | Status: DC | PRN
  Administered 2021-05-21: 100 mg via INTRAVENOUS

## 2021-05-21 MED ORDER — PROPOFOL 10 MG/ML IV BOLUS
INTRAVENOUS | Status: AC
  Filled 2021-05-21: qty 20

## 2021-05-21 MED ORDER — ROCURONIUM BROMIDE 10 MG/ML (PF) SYRINGE
PREFILLED_SYRINGE | INTRAVENOUS | Status: DC | PRN
Start: 2021-05-21 — End: 2021-05-21
  Administered 2021-05-21: 30 mg via INTRAVENOUS
  Administered 2021-05-21: 70 mg via INTRAVENOUS
  Administered 2021-05-21: 10 mg via INTRAVENOUS

## 2021-05-21 MED ORDER — CHLORHEXIDINE GLUCONATE 0.12 % MT SOLN
OROMUCOSAL | Status: AC
  Administered 2021-05-21: 15 mL via OROMUCOSAL
  Filled 2021-05-21: qty 15

## 2021-05-21 MED ORDER — PREDNISOLONE ACETATE 1 % OP SUSP
OPHTHALMIC | Status: DC | PRN
  Administered 2021-05-21: 1 [drp] via OPHTHALMIC

## 2021-05-21 MED ORDER — DEXAMETHASONE SODIUM PHOSPHATE 10 MG/ML IJ SOLN
INTRAMUSCULAR | Status: DC | PRN
  Administered 2021-05-21: 5 mg via INTRAVENOUS

## 2021-05-21 MED ORDER — CEFTAZIDIME 1 G IJ SOLR
INTRAMUSCULAR | Status: AC
  Filled 2021-05-21: qty 1

## 2021-05-21 MED ORDER — BUPIVACAINE HCL (PF) 0.75 % IJ SOLN
INTRAMUSCULAR | Status: AC
  Filled 2021-05-21: qty 10

## 2021-05-21 MED ORDER — BRIMONIDINE TARTRATE 0.2 % OP SOLN
OPHTHALMIC | Status: AC
  Filled 2021-05-21: qty 5

## 2021-05-21 MED ORDER — PROPARACAINE HCL 0.5 % OP SOLN
1.0000 [drp] | OPHTHALMIC | Status: AC | PRN
  Administered 2021-05-21 (×3): 1 [drp] via OPHTHALMIC
  Filled 2021-05-21: qty 15

## 2021-05-21 MED ORDER — AMISULPRIDE (ANTIEMETIC) 5 MG/2ML IV SOLN: 10.0000 mg | Freq: Once | INTRAVENOUS | Status: DC | PRN

## 2021-05-21 MED ORDER — ACETAMINOPHEN 500 MG PO TABS
1000.0000 mg | ORAL_TABLET | Freq: Once | ORAL | Status: AC
  Administered 2021-05-21: 1000 mg via ORAL
  Filled 2021-05-21: qty 2

## 2021-05-21 MED ORDER — ORAL CARE MOUTH RINSE: 15.0000 mL | Freq: Once | OROMUCOSAL | Status: AC

## 2021-05-21 MED ORDER — CHLORHEXIDINE GLUCONATE 0.12 % MT SOLN: 15.0000 mL | Freq: Once | OROMUCOSAL | Status: AC

## 2021-05-21 MED ORDER — NA CHONDROIT SULF-NA HYALURON 40-30 MG/ML IO SOSY
INTRAOCULAR | Status: DC | PRN
  Administered 2021-05-21: 0.5 mL via INTRAOCULAR

## 2021-05-21 MED ORDER — SUGAMMADEX SODIUM 200 MG/2ML IV SOLN
INTRAVENOUS | Status: DC | PRN
  Administered 2021-05-21: 200 mg via INTRAVENOUS

## 2021-05-21 MED ORDER — FENTANYL CITRATE (PF) 100 MCG/2ML IJ SOLN
25.0000 ug | INTRAMUSCULAR | Status: DC | PRN
  Administered 2021-05-21: 25 ug via INTRAVENOUS

## 2021-05-21 MED ORDER — ONDANSETRON HCL 4 MG/2ML IJ SOLN
INTRAMUSCULAR | Status: DC | PRN
  Administered 2021-05-21: 4 mg via INTRAVENOUS

## 2021-05-21 MED ORDER — ONDANSETRON HCL 4 MG/2ML IJ SOLN
INTRAMUSCULAR | Status: AC
  Filled 2021-05-21: qty 2

## 2021-05-21 MED ORDER — PHENYLEPHRINE HCL-NACL 20-0.9 MG/250ML-% IV SOLN
INTRAVENOUS | Status: DC | PRN
  Administered 2021-05-21: 25 ug/min via INTRAVENOUS

## 2021-05-21 MED ORDER — TRIAMCINOLONE ACETONIDE 40 MG/ML IJ SUSP
INTRAMUSCULAR | Status: AC
  Filled 2021-05-21: qty 5

## 2021-05-21 MED ORDER — BACITRACIN-POLYMYXIN B 500-10000 UNIT/GM OP OINT
TOPICAL_OINTMENT | OPHTHALMIC | Status: AC
  Filled 2021-05-21: qty 3.5

## 2021-05-21 MED ORDER — PROMETHAZINE HCL 25 MG/ML IJ SOLN: 6.2500 mg | INTRAMUSCULAR | Status: DC | PRN

## 2021-05-21 MED ORDER — DORZOLAMIDE HCL-TIMOLOL MAL 2-0.5 % OP SOLN
OPHTHALMIC | Status: DC | PRN
  Administered 2021-05-21: 1 [drp] via OPHTHALMIC

## 2021-05-21 MED ORDER — BSS PLUS IO SOLN
INTRAOCULAR | Status: AC
  Filled 2021-05-21: qty 500

## 2021-05-21 MED ORDER — DORZOLAMIDE HCL-TIMOLOL MAL 2-0.5 % OP SOLN
OPHTHALMIC | Status: AC
  Filled 2021-05-21: qty 10

## 2021-05-21 MED ORDER — BRIMONIDINE TARTRATE 0.2 % OP SOLN
OPHTHALMIC | Status: DC | PRN
  Administered 2021-05-21: 1 [drp] via OPHTHALMIC

## 2021-05-21 MED ORDER — FENTANYL CITRATE (PF) 250 MCG/5ML IJ SOLN
INTRAMUSCULAR | Status: AC
  Filled 2021-05-21: qty 5

## 2021-05-21 MED ORDER — GLYCOPYRROLATE 0.2 MG/ML IJ SOLN
INTRAMUSCULAR | Status: DC | PRN
  Administered 2021-05-21: .2 mg via INTRAVENOUS

## 2021-05-21 MED ORDER — GATIFLOXACIN 0.5 % OP SOLN
OPHTHALMIC | Status: AC
  Filled 2021-05-21: qty 2.5

## 2021-05-21 MED ORDER — SODIUM CHLORIDE (PF) 0.9 % IJ SOLN
INTRAMUSCULAR | Status: AC
  Filled 2021-05-21: qty 10

## 2021-05-21 MED ORDER — TRIAMCINOLONE ACETONIDE 40 MG/ML IJ SUSP
INTRAMUSCULAR | Status: DC | PRN
  Administered 2021-05-21: .5 mL

## 2021-05-21 MED ORDER — EPINEPHRINE PF 1 MG/ML IJ SOLN
INTRAOCULAR | Status: DC | PRN
  Administered 2021-05-21: 500.3 mL

## 2021-05-21 MED ORDER — FENTANYL CITRATE (PF) 250 MCG/5ML IJ SOLN
INTRAMUSCULAR | Status: DC | PRN
  Administered 2021-05-21: 50 ug via INTRAVENOUS
  Administered 2021-05-21: 100 ug via INTRAVENOUS
  Administered 2021-05-21: 50 ug via INTRAVENOUS

## 2021-05-21 MED ORDER — TROPICAMIDE 1 % OP SOLN
1.0000 [drp] | OPHTHALMIC | Status: AC | PRN
  Administered 2021-05-21 (×3): 1 [drp] via OPHTHALMIC
  Filled 2021-05-21: qty 15

## 2021-05-21 MED ORDER — BSS IO SOLN
INTRAOCULAR | Status: AC
  Filled 2021-05-21: qty 15

## 2021-05-21 MED ORDER — ATROPINE SULFATE 1 % OP SOLN
OPHTHALMIC | Status: AC
  Filled 2021-05-21: qty 5

## 2021-05-21 MED ORDER — MIDAZOLAM HCL 2 MG/2ML IJ SOLN
INTRAMUSCULAR | Status: AC
  Filled 2021-05-21: qty 2

## 2021-05-21 MED ORDER — ATROPINE SULFATE 1 % OP SOLN
1.0000 [drp] | OPHTHALMIC | Status: AC | PRN
  Administered 2021-05-21 (×3): 1 [drp] via OPHTHALMIC
  Filled 2021-05-21: qty 5

## 2021-05-21 MED ORDER — MIDAZOLAM HCL 2 MG/2ML IJ SOLN
INTRAMUSCULAR | Status: DC | PRN
  Administered 2021-05-21: 2 mg via INTRAVENOUS

## 2021-05-21 SURGICAL SUPPLY — 47 items
APPLICATOR COTTON TIP 6 STRL (MISCELLANEOUS) ×4 IMPLANT
APPLICATOR COTTON TIP 6IN STRL (MISCELLANEOUS) ×8 IMPLANT
BAND WRIST GAS GREEN (MISCELLANEOUS) ×1 IMPLANT
BLADE MVR KNIFE 20G (BLADE) ×2 IMPLANT
BNDG EYE OVAL (GAUZE/BANDAGES/DRESSINGS) ×2 IMPLANT
CABLE BIPOLOR RESECTION CORD (MISCELLANEOUS) ×2 IMPLANT
CANNULA DUALBORE 25G (CANNULA) ×2 IMPLANT
CANNULA FLEX TIP 25G (CANNULA) ×2 IMPLANT
CANNULA VLV SOFT TIP 25GA (OPHTHALMIC) IMPLANT
CLSR STERI-STRIP ANTIMIC 1/2X4 (GAUZE/BANDAGES/DRESSINGS) ×2 IMPLANT
DEFOGGER ANTIFOG KIT (MISCELLANEOUS) ×2 IMPLANT
DRAPE INCISE 51X51 W/FILM STRL (DRAPES) ×2 IMPLANT
DRAPE MICROSCOPE LEICA 46X105 (MISCELLANEOUS) ×2 IMPLANT
DRAPE OPHTHALMIC 77X100 STRL (CUSTOM PROCEDURE TRAY) ×2 IMPLANT
FILTER BLUE MILLIPORE (MISCELLANEOUS) IMPLANT
FORCEPS GRIESHABER ILM 25G A (INSTRUMENTS) IMPLANT
GAS AUTO FILL CONSTEL (OPHTHALMIC) ×2
GAS AUTO FILL CONSTELLATION (OPHTHALMIC) ×1 IMPLANT
GAS WRIST BAND GREEN (MISCELLANEOUS) ×1
GOWN STRL REUS W/ TWL LRG LVL3 (GOWN DISPOSABLE) ×2 IMPLANT
GOWN STRL REUS W/ TWL XL LVL3 (GOWN DISPOSABLE) ×1 IMPLANT
GOWN STRL REUS W/TWL LRG LVL3 (GOWN DISPOSABLE) ×2
GOWN STRL REUS W/TWL XL LVL3 (GOWN DISPOSABLE) ×1
KIT BASIN OR (CUSTOM PROCEDURE TRAY) ×2 IMPLANT
KIT PERFLUORON PROCEDURE 5ML (MISCELLANEOUS) ×2 IMPLANT
LENS VITRECTOMY FLAT OCLR DISP (MISCELLANEOUS) ×2 IMPLANT
LOOP FINESSE 25 GA (MISCELLANEOUS) IMPLANT
MICROPICK 25G (MISCELLANEOUS)
NEEDLE 18GX1X1/2 (RX/OR ONLY) (NEEDLE) ×6 IMPLANT
NEEDLE 25GX 5/8IN NON SAFETY (NEEDLE) ×6 IMPLANT
NEEDLE HYPO 30X.5 LL (NEEDLE) IMPLANT
NEEDLE PRECISIONGLIDE 27X1.5 (NEEDLE) IMPLANT
NS IRRIG 1000ML POUR BTL (IV SOLUTION) ×2 IMPLANT
PACK VITRECTOMY CUSTOM (CUSTOM PROCEDURE TRAY) ×2 IMPLANT
PAD ARMBOARD 7.5X6 YLW CONV (MISCELLANEOUS) ×4 IMPLANT
PAK PIK VITRECTOMY CVS 25GA (OPHTHALMIC) ×2 IMPLANT
PENCIL BIPOLAR 25GA STR DISP (OPHTHALMIC RELATED) ×2 IMPLANT
PICK MICROPICK 25G (MISCELLANEOUS) IMPLANT
PROBE ENDO DIATHERMY 25G (MISCELLANEOUS) ×2 IMPLANT
PROBE LASER ILLUM FLEX CVD 25G (OPHTHALMIC) ×2 IMPLANT
RETRACTOR IRIS FLEX 25G GRIESH (INSTRUMENTS) IMPLANT
SHIELD EYE LENSE ONLY DISP (GAUZE/BANDAGES/DRESSINGS) ×2 IMPLANT
SUT VICRYL 7 0 TG140 8 (SUTURE) ×2 IMPLANT
SYR 10ML LL (SYRINGE) ×4 IMPLANT
SYR TB 1ML LUER SLIP (SYRINGE) ×6 IMPLANT
TOWEL GREEN STERILE FF (TOWEL DISPOSABLE) ×2 IMPLANT
WATER STERILE IRR 1000ML POUR (IV SOLUTION) ×2 IMPLANT

## 2021-05-21 NOTE — Progress Notes (Addendum)
[facility] Note  05/21/2021     CHIEF COMPLAINT Patient presents for Retina Follow Up  HISTORY OF PRESENT ILLNESS: Douglas Cox is a 70 y.o. male who presents to the clinic today for:   HPI     Retina Follow Up   Patient presents with  Retinal Break/Detachment.  In right eye.  Severity is moderate.  Duration of 3 days.  Since onset it is stable.  I, the attending physician,  performed the HPI with the patient and updated documentation appropriately.        Comments   Pt here for 3 day ret f/u for RD OD. Pt states vision is getting better from Friday's visit. No ocular pain or discomfort. Pt reports taking the Lotemax samples QID OD. Hasnt taken any today, alters vision.       Last edited by Bernarda Caffey, MD on 05/21/2021  8:57 AM.    Pt states no changes in vision.   Gas bubble looks like a "pearl" in vision.  Referring physician: No referring provider defined for this encounter.  HISTORICAL INFORMATION:  Selected notes from the [facility] for eval of increased floaters/veil OS   CURRENT MEDICATIONS: No current outpatient medications on file. (Ophthalmic Drugs)   No current facility-administered medications for this visit. (Ophthalmic Drugs)   Current Outpatient Medications (Other)  Medication Sig   acidophilus (RISAQUAD) CAPS capsule Take 1 capsule by mouth daily. Align once daily   cholecalciferol (VITAMIN D3) 25 MCG (1000 UNIT) tablet Take 1,000 Units by mouth daily.   Cyanocobalamin 1000 MCG/ML KIT Inject 1 mL as directed. Every 2 weeks   psyllium (METAMUCIL) 58.6 % powder Take 1 packet by mouth daily.   No current facility-administered medications for this visit. (Other)   REVIEW OF SYSTEMS: ROS   Positive for: Cardiovascular, Eyes Negative for: Constitutional, Gastrointestinal, Neurological, Skin, Genitourinary, Musculoskeletal, HENT, Endocrine, Respiratory, Psychiatric, Allergic/Imm,  Heme/Lymph Last edited by Kingsley Spittle, COT on 05/21/2021  7:56 AM.     ALLERGIES Allergies  Allergen Reactions   Codone [Hydrocodone] Hives   Clarithromycin Anxiety   PAST MEDICAL HISTORY Past Medical History:  Diagnosis Date   Celiac disease    Retinal detachment    Past Surgical History:  Procedure Laterality Date   CATARACT EXTRACTION Bilateral    Dr. Valetta Close   EYE SURGERY     FRACTURE SURGERY     HERNIA REPAIR     RETINAL DETACHMENT SURGERY     FAMILY HISTORY History reviewed. No pertinent family history.  SOCIAL HISTORY Social History   Tobacco Use   Smoking status: Former    Types: Cigarettes    Quit date: 01/30/2006    Years since quitting: 15.3   Smokeless tobacco: Never  Vaping Use   Vaping Use: Never used  Substance Use Topics   Alcohol use: Yes    Alcohol/week: 4.0 - 5.0 standard drinks    Types: 4 - 5 Shots of liquor per week    Comment: 3 glasses of whiskey per night.   Drug use: Yes    Frequency: 7.0 times per week    Types: Marijuana       OPHTHALMIC EXAM: Base Eye Exam     Visual Acuity (Snellen - Linear)       Right Left   Dist [place] 20/20 def         Tonometry (Tonopen, 8:01 AM)       Right Left   Pressure  12 def         Pupils       Dark Light Shape React APD   Right 3 2 Round Minimal  None   Left 3 2 Round Brisk None         Visual Fields (Counting fingers)       Left Right    Full Full         Extraocular Movement       Right Left    Full, Ortho Full, Ortho         Neuro/Psych     Oriented x3: Yes   Mood/Affect: Normal         Dilation     Right eye: 1.0% Mydriacyl, 2.5% Phenylephrine @ 8:01 AM           Slit Lamp and Fundus Exam     Slit Lamp Exam       Right Left   Lids/Lashes Dermatochalasis - upper lid Dermatochalasis - upper lid, mild Meibomian gland dysfunction   Conjunctiva/Sclera Subconjunctival hemorrhage - improving, pneumatic Chemosis White and quiet   Cornea arcus,  well healed temporal cataract wounds, trace PEE arcus, well healed temporal cataract wounds   Anterior Chamber Deep and quiet Deep and quiet   Iris Round and dilated Round and dilated   Lens PC IOL in good position, trace Posterior capsular opacification PC IOL in good position, trace Posterior capsular opacification   Vitreous Vitreous syneresis, prominent vitreous condensations settled inferiorly, +pigment, Posterior vitreous detachment; single gas bubble about ~2% , vitreous debris Vitreous syneresis, no pigment, Posterior vitreous detachment, Weiss ring, prominent linear vitreous condensation in IN quad         Fundus Exam       Right Left   Disc Pink and Sharp, Compact Pink and Sharp, Compact, mild tilt   C/D Ratio 0.2 0.2   Macula Flat, good foveal reflex, trace ERM, mild RPE mottling, No heme or edema Flat, Blunted foveal reflex, mild RPE mottling, No heme or edema   Vessels mild attenuation, mild tortuousity mild attenuation, mild tortuousity   Periphery Bullous nasal RD from [id number] with small HST at 0130; interval increase in residual SRF inferiorly extending inferotemporal quadrant, cryo changes around 0130 tear -- not yet fully mature Attached, mild pigmented paving stone degeneration IT quad, No heme , No RT/RD           Refraction     Wearing Rx       Sphere Cylinder Axis Add   Right +0.25 Sphere  +2.50   Left -0.50 +0.75 117 +2.50           IMAGING AND PROCEDURES  Imaging and Procedures for 05/21/2021  OCT, Retina - OU - Both Eyes       Right Eye Quality was good. Central Foveal Thickness: 294. Progression has worsened. Findings include normal foveal contour, no IRF, vitreomacular adhesion , subretinal fluid (Interval increase in residual SRF caught on widefield ).   Left Eye Quality was good. Central Foveal Thickness: 270. Progression has been stable. Findings include normal foveal contour, no IRF, no SRF (Mild vitreous opacities).   Notes *Images  captured and stored on drive  Diagnosis / Impression:  OD: NFP, no IRF; Interval increase in inf SRF caught on widefield  OS: NFP, no IRF/SRF  Clinical management:  See below  Abbreviations: NFP - Normal foveal profile. CME - cystoid macular edema. PED - pigment epithelial detachment. IRF - intraretinal fluid. SRF - subretinal  fluid. EZ - ellipsoid zone. ERM - epiretinal membrane. ORA - outer retinal atrophy. ORT - outer retinal tubulation. SRHM - subretinal hyper-reflective material. IRHM - intraretinal hyper-reflective material             ASSESSMENT/PLAN:   ICD-10-CM   1. Right retinal detachment  H33.21     2. Retinal edema  H35.81 OCT, Retina - OU - Both Eyes    3. Posterior vitreous detachment of left eye  H43.812     4. Pseudophakia, both eyes  Z96.1      1,2. 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 PVD OS  - acute development of prominent floaters OS -- saw Dr. Katy Fitch on call on Saturday, 10.16.21  - Discussed findings and prognosis  - No RT or RD on 360 scleral depressed exam  - Reviewed s/s of RT/RD  - Strict return precautions for any such RT/RD signs/symptoms   - f/u in 3-4 wks -- DFE/OCT  4. Pseudophakia OU  - s/p CE/IOL OU (Dr. Valetta Close)  - IOLs in good position, doing well  - monitor   Ophthalmic Meds Ordered this visit:  No orders of the defined types were placed in this encounter.    Return for Return Friday for 1 day post op.  There are no  Patient Instructions on file for this visit.  This document serves as a record of services personally performed by Gardiner Sleeper, MD, PhD. It was created on their behalf by Leonie Douglas, an ophthalmic technician. The creation of this record is the provider's dictation and/or activities during the visit.    Electronically signed by: Leonie Douglas COA, 05/21/21  8:58 AM  Gardiner Sleeper, M.D., Ph.D. Diseases & Surgery of the Retina and Vitreous [facility] 05/21/2021  I have reviewed the above documentation for accuracy and completeness, and I agree with the above. Gardiner Sleeper, M.D., Ph.D. 05/21/21 10:35 AM  Abbreviations: M myopia (nearsighted); A astigmatism; H hyperopia (farsighted); P presbyopia; Mrx spectacle prescription;  CTL contact lenses; OD right eye; OS left eye; OU both eyes  XT exotropia; ET esotropia; PEK punctate epithelial keratitis; PEE punctate epithelial erosions; DES dry eye syndrome; MGD meibomian gland dysfunction; ATs artificial tears; PFAT's preservative free artificial tears; [place] nuclear sclerotic cataract; PSC posterior subcapsular cataract; ERM epi-retinal membrane; PVD posterior vitreous detachment; RD retinal detachment; DM diabetes mellitus; DR diabetic retinopathy; NPDR non-proliferative diabetic retinopathy; PDR proliferative diabetic retinopathy; CSME clinically significant macular edema; DME diabetic macular edema; dbh dot blot hemorrhages; CWS cotton wool spot; POAG primary open angle glaucoma; C/D cup-to-disc ratio; HVF humphrey visual field; GVF goldmann visual field; OCT optical coherence tomography; IOP intraocular pressure; BRVO Branch retinal vein occlusion; CRVO central retinal vein occlusion; CRAO central retinal artery occlusion; BRAO branch retinal artery occlusion; RT retinal tear; SB scleral buckle; PPV pars plana vitrectomy; VH Vitreous hemorrhage; PRP panretinal laser photocoagulation; IVK intravitreal kenalog; VMT  vitreomacular traction; MH Macular hole;  NVD neovascularization of the disc; NVE neovascularization elsewhere; AREDS age related eye disease study; ARMD age related macular degeneration; POAG primary open angle glaucoma; EBMD epithelial/anterior basement membrane dystrophy; ACIOL anterior chamber intraocular lens; IOL intraocular lens; PCIOL posterior chamber intraocular lens; Phaco/IOL phacoemulsification with intraocular lens placement; [place] photorefractive keratectomy; LASIK laser assisted in situ keratomileusis; HTN hypertension; DM diabetes mellitus; COPD chronic obstructive pulmonary disease

## 2021-05-21 NOTE — Anesthesia Preprocedure Evaluation (Addendum)
Anesthesia Evaluation  Patient identified by MRN, date of birth, ID band Patient awake    Reviewed: Allergy & Precautions, NPO status , Patient's Chart, lab work & pertinent test results  History of Anesthesia Complications Negative for: history of anesthetic complications  Airway Mallampati: II  TM Distance: >3 FB Neck ROM: Full    Dental no notable dental hx. (+) Dental Advisory Given   Pulmonary former smoker,    Pulmonary exam normal        Cardiovascular negative cardio ROS Normal cardiovascular exam     Neuro/Psych negative neurological ROS     GI/Hepatic negative GI ROS, Neg liver ROS,   Endo/Other  negative endocrine ROS  Renal/GU negative Renal ROS     Musculoskeletal negative musculoskeletal ROS (+)   Abdominal   Peds  Hematology negative hematology ROS (+)   Anesthesia Other Findings   Reproductive/Obstetrics                            Anesthesia Physical Anesthesia Plan  ASA: 2  Anesthesia Plan: General   Post-op Pain Management:    Induction: Intravenous  PONV Risk Score and Plan: 3 and Ondansetron, Dexamethasone and Midazolam  Airway Management Planned: Oral ETT  Additional Equipment:   Intra-op Plan:   Post-operative Plan: Extubation in OR  Informed Consent: I have reviewed the patients History and Physical, chart, labs and discussed the procedure including the risks, benefits and alternatives for the proposed anesthesia with the patient or authorized representative who has indicated his/her understanding and acceptance.     Dental advisory given  Plan Discussed with: Anesthesiologist and CRNA  Anesthesia Plan Comments:        Anesthesia Quick Evaluation

## 2021-05-21 NOTE — Op Note (Signed)
Date of procedure: 10.20.2022   Surgeon: Bernarda Caffey, MD, PhD   Assistant: Leonie Douglas, COT   Pre-operative Diagnosis: Rhegmatogenous Retinal Detachment, Right Eye   Post-operative diagnosis: Rhegmatogenous Retinal Detachment, Right Eye   Anesthesia: GETA   Procedure: 1)     25 gauge pars plana vitrectomy, Right Eye CPT [id number]  2)     Perfluorocarbon injection, Right Eye 3)     Fluid-air exchange, Right Eye 3)     Endolaser, Right Eye 4)     Injection of 14% H7W2 gas   Complications: none Estimated blood loss: minimal Specimens: none   Brief history:  The patient has a history of retinal detachment in the right s/p pneumatic retinopexy OD on 09.27.22. The retinal detachment improved, but failed to re-attach completely. After a discussion of the risks, benefits, and alternatives to surgery, the patient elected to proceed with surgery to repair the retinal detachment in the right eye. Informed consent was obtained from the patient and placed in the chart.     Procedure: The patient was brought to the preoperative holding area where the correct eye was confirmed and marked.  The patient was then brought to the operating room where general endotracheal anesthesia was induced. A secondary time-out was performed to identify the correct patient, eye, procedure, and any allergies. The eye was prepped and draped in the usual sterile ophthalmic fashion followed by placement of a lid speculum.  A 25 gauge trocar was placed in the inferotemporal quadrant in a beveled fashion. A 4 mm infusion cannula was placed through this trocar. The infusion cannula was confirmed in the vitreous cavity with no incarceration of retina or choroid prior to turning it on. Two additional 25 gauge trocars were placed in the superonasal and superotemporal quadrants (2 and 10 oclock, respectively) in a similar beveled fashion. At this time, anterior vitrectomy was performed to remove the anterior hyaloid and vitreous, as  well as the residual C3F8 gas bubbles from his prior pneumatic cryopexy procedure. Then, a standard three-port pars plana vitrectomy was performed using the light pipe, the cutter, and the BIOM viewing system. A thorough core and peripheral vitreous dissection was performed with the assistance of intravitreal kenalog. A posterior vitreous detachment was confirmed over the optic nerve. There was a residual inferior retinal retinal detachment from 0300-0800 oclock without macular or foveal involvement -- subretinal fluid extended posteriorly just outside the IT arcades. Of note, the patient originally had a nasal and inferior detachment from a small break at 0130-0200 oclock that was repaired via pneumatic cryopexy on 09.27.22. The superonasal quadrant was attached. On peripheral inspection, we located a small peripheral break at 0400 oclock that caused the persistent inferior detachment.    Traction was carefully removed from all retinal breaks. The breaks were trimmed using the cutter to smooth the edges. A meticulous shave of the vitreous base was performed with scleral depression. Perfluoron was injected to push the subretinal fluid anteriorly towards the retinal break.  A complete fluid-air exchange was performed with a soft tip extrusion cannula over the superior breaks. After completion of these maneuvers, the posterior pole and peripheral retina were noted to be flat. Under air, endolaser was applied around the retinal breaks 360 degrees to the peripheral retina.  At this time, the superotemporal trocar was removed and sutured with 7-0 vicryl in an interrupted fashion.  A complete air to 14% C3F8 gas exchange was performed through the infusion cannula and vented through the superonasal trocar using the extrusion  cannula. The superonasal trocar and inferotemporal infusion cannula and associated trocar were then removed and sutured with 7-0 vicryl in an interrupted fashion.   The eye was confirmed to be at  a physiologic pressure by digital palpation. Subconjunctival injections of Antibiotic and kenalog were administered. The lid speculum and drapes were removed. Drops of an antibiotic, antihypertensives, and steroid were given. The eye was patched and shielded. The patient tolerated the procedure well without any intraoperative or immediate postoperative complications. The patient was taken to the recovery room in good condition. The patient was instructed to maintain a strict face-down position and will be seen by Dr. Coralyn Pear tomorrow morning in clinic.

## 2021-05-21 NOTE — Transfer of Care (Signed)
Immediate Anesthesia Transfer of Care Note  Patient: Douglas Cox  Procedure(s) Performed: PARS PLANA VITRECTOMY WITH 25 GAUGE (Right: Eye) PHOTOCOAGULATION WITH LASER (Right: Eye) INSERTION OF GAS - C3F8 (Right: Eye) GAS/FLUID EXCHANGE (Right: Eye) PERFLUORONE INJECTION (Right: Eye)  Patient Location: PACU  Anesthesia Type:General  Level of Consciousness: drowsy and patient cooperative  Airway & Oxygen Therapy: Patient Spontanous Breathing  Post-op Assessment: Report given to RN and Post -op Vital signs reviewed and stable  Post vital signs: Reviewed and stable  Last Vitals:  Vitals Value Taken Time  BP 155/75 05/21/21 1849  Temp    Pulse 65 05/21/21 1849  Resp 18 05/21/21 1849  SpO2 94 % 05/21/21 1849  Vitals shown include unvalidated device data.  Last Pain:  Vitals:   05/21/21 1348  TempSrc:   PainSc: 2          Complications: No notable events documented.

## 2021-05-21 NOTE — H&P (Signed)
Douglas Cox is an 70 y.o. male.    Chief Complaint: retinal detachment, right eye  HPI: Pt with history of right retinal detachment, s/p pneumatic retinopexy on 09.27.22. Pt showed improvement in retinal detachment, but has residual subretinal fluid inferiorly. After a discussion of the risks benefits and alternatives to surgery, the patient has elected to proceed with surgery to repair the retinal detachment of the right eye-- 25g PPV w/ endolaser and gas, RIGHT EYE, under general anesthesia.  Past Medical History:  Diagnosis Date   Celiac disease    Retinal detachment     Past Surgical History:  Procedure Laterality Date   CATARACT EXTRACTION Bilateral    Dr. Valetta Close   EYE SURGERY     FRACTURE SURGERY     HERNIA REPAIR     RETINAL DETACHMENT SURGERY      No family history on file. Social History:  reports that he quit smoking about 15 years ago. His smoking use included cigarettes. He has never used smokeless tobacco. He reports current alcohol use of about 4.0 - 5.0 standard drinks per week. He reports current drug use. Frequency: 7.00 times per week. Drug: Marijuana.  Allergies:  Allergies  Allergen Reactions   Codone [Hydrocodone] Hives   Clarithromycin Anxiety    No medications prior to admission.    Review of systems otherwise negative  There were no vitals taken for this visit.  Physical exam: Mental status: oriented x3. Eyes: See eye exam associated with this date of surgery Ears, Nose, Throat: within normal limits Neck: Within Normal limits General: within normal limits Chest: Within normal limits Breast: deferred Heart: Within normal limits Abdomen: Within normal limits GU: deferred Extremities: within normal limits Skin: within normal limits  Assessment/Plan Retial detachment right eye Plan: To [facility] for urgent 25g PPV w/ endolaser and gas, RIGHT EYE, under general anesthesia. - case scheduled for later this afternoon -- [place] 08 - pt  reports last po intake was tea this morning at 7 am   Gardiner Sleeper, M.D., Ph.D. Vitreoretinal Surgeon [facility]

## 2021-05-21 NOTE — Brief Op Note (Signed)
05/21/2021  6:50 PM  PATIENT:  Douglas Cox  70 y.o. male  PRE-OPERATIVE DIAGNOSIS:  Rhegmatogenous retinal detachment, OD  POST-OPERATIVE DIAGNOSIS:  Rhegmatogenous retinal detachment, OD  PROCEDURE:  Procedure(s): PARS PLANA VITRECTOMY WITH 25 GAUGE (Right) PHOTOCOAGULATION WITH LASER (Right) INSERTION OF GAS - C3F8 (Right) GAS/FLUID EXCHANGE (Right) PERFLUORONE INJECTION (Right)  SURGEON:  Surgeon(s) and Role:    Bernarda Caffey, MD - Primary  ASSISTANTS: Leonie Douglas, COT   ANESTHESIA:   general  EBL:  10 mL   BLOOD ADMINISTERED:none  DRAINS: none   LOCAL MEDICATIONS USED:  NONE  SPECIMEN:  No Specimen  DISPOSITION OF SPECIMEN:  N/A  COUNTS:  YES  TOURNIQUET:  * No tourniquets in log *  DICTATION: .Note written in EPIC  PLAN OF CARE: Discharge to home after PACU  PATIENT DISPOSITION:  PACU - hemodynamically stable.   Delay start of Pharmacological VTE agent (>24hrs) due to surgical blood loss or risk of bleeding: not applicable

## 2021-05-21 NOTE — Interval H&P Note (Signed)
History and Physical Interval Note:  05/21/2021 3:23 PM  Douglas Cox  has presented today for surgery, with the diagnosis of Rhegmatogenous retinal detachment, OD.  The various methods of treatment have been discussed with the patient and family. After consideration of risks, benefits and other options for treatment, the patient has consented to  Procedure(s): PARS PLANA VITRECTOMY WITH 25 GAUGE (Right) as a surgical intervention.  The patient's history has been reviewed, patient examined, no change in status, stable for surgery.  I have reviewed the patient's chart and labs.  Questions were answered to the patient's satisfaction.     Bernarda Caffey

## 2021-05-21 NOTE — Discharge Instructions (Signed)
POSTOPERATIVE INSTRUCTIONS  Your doctor has performed vitreoretinal surgery on you at [facility]. [place] eye patched and shielded until seen by Dr. Coralyn Pear 8 AM tomorrow in clinic - Do not use drops until return - [place] - Sleep with belly down or on left side, avoid laying flat on back.    - No strenuous bending, stooping or lifting.  - You may not drive until further notice.  - If your doctor used a gas bubble in your eye during the procedure he will advise you on postoperative positioning. If you have a gas bubble you will be wearing a green bracelet that was applied in the operating room. The green bracelet should stay on as long as the gas bubble is in your eye. While the gas bubble is present you should not fly in an airplane. If you require general anesthesia while the gas bubble is present you must notify your anesthesiologist that an intraocular gas bubble is present so he can take the appropriate precautions.  - Tylenol or any other over-the-counter pain reliever can be used according to your doctor. If more pain medicine is required, your doctor will have a prescription for you.  - You may read, go up and down stairs, and watch television.     Bernarda Caffey, M.D., Ph.D.

## 2021-05-21 NOTE — Progress Notes (Signed)
No pre-op labs needed per Dr. Tobias Alexander.  Jacqlyn Larsen, RN

## 2021-05-21 NOTE — Anesthesia Procedure Notes (Signed)
Procedure Name: Intubation Date/Time: 05/21/2021 3:55 PM Performed by: Inda Coke, CRNA Pre-anesthesia Checklist: Patient identified, Emergency Drugs available, Suction available and Patient being monitored Patient Re-evaluated:Patient Re-evaluated prior to induction Oxygen Delivery Method: Circle System Utilized Preoxygenation: Pre-oxygenation with 100% oxygen Induction Type: IV induction Ventilation: Mask ventilation without difficulty Laryngoscope Size: Mac and 4 Grade View: Grade I Tube type: Oral Tube size: 7.5 mm Number of attempts: 1 Airway Equipment and Method: Stylet and Oral airway Placement Confirmation: ETT inserted through vocal cords under direct vision, positive ETCO2 and breath sounds checked- equal and bilateral Secured at: 21 cm Tube secured with: Tape Dental Injury: Teeth and Oropharynx as per pre-operative assessment

## 2021-05-22 ENCOUNTER — Encounter (INDEPENDENT_AMBULATORY_CARE_PROVIDER_SITE_OTHER): Payer: Self-pay | Admitting: Ophthalmology

## 2021-05-22 ENCOUNTER — Ambulatory Visit (INDEPENDENT_AMBULATORY_CARE_PROVIDER_SITE_OTHER): Payer: Medicare Other | Admitting: Ophthalmology

## 2021-05-22 DIAGNOSIS — H43812 Vitreous degeneration, left eye: Secondary | ICD-10-CM

## 2021-05-22 DIAGNOSIS — H3581 Retinal edema: Secondary | ICD-10-CM

## 2021-05-22 DIAGNOSIS — H3321 Serous retinal detachment, right eye: Secondary | ICD-10-CM

## 2021-05-22 DIAGNOSIS — Z961 Presence of intraocular lens: Secondary | ICD-10-CM

## 2021-05-22 NOTE — Anesthesia Postprocedure Evaluation (Signed)
Anesthesia Post Note  Patient: Douglas Cox  Procedure(s) Performed: PARS PLANA VITRECTOMY WITH 25 GAUGE (Right: Eye) PHOTOCOAGULATION WITH LASER (Right: Eye) INSERTION OF GAS - C3F8 (Right: Eye) GAS/FLUID EXCHANGE (Right: Eye) PERFLUORONE INJECTION (Right: Eye)     Patient location during evaluation: PACU Anesthesia Type: General Level of consciousness: awake Pain management: pain level controlled Vital Signs Assessment: post-procedure vital signs reviewed and stable Respiratory status: spontaneous breathing Cardiovascular status: stable Postop Assessment: no apparent nausea or vomiting Anesthetic complications: no   No notable events documented.  Last Vitals:  Vitals:   05/21/21 1935 05/21/21 1950  BP: 132/79 126/70  Pulse: 66 62  Resp: 17 16  Temp:  36.7 C  SpO2: 92% 92%    Last Pain:  Vitals:   05/21/21 1850  TempSrc:   PainSc: Asleep                 Douglas Cox

## 2021-05-22 NOTE — Progress Notes (Signed)
[facility] Note  05/22/2021     CHIEF COMPLAINT Patient presents for Post-op Follow-up  HISTORY OF PRESENT ILLNESS: Douglas Cox is a 70 y.o. male who presents to the clinic today for:   HPI     Post-op Follow-up   In right eye.  Discomfort includes pain.  I, the attending physician,  performed the HPI with the patient and updated documentation appropriately.        Comments   Pt states OD hurts today and pt states he doesn't feel well overall.  Vision is very poor OD per patient.      Last edited by Bernarda Caffey, MD on 05/22/2021 10:10 PM.    Pt did not sleep well last night; uncomfortable.  OD feels scratchy.  Referring physician: No referring provider defined for this encounter.  HISTORICAL INFORMATION:  Selected notes from the [facility] for eval of increased floaters/veil OS   CURRENT MEDICATIONS: No current outpatient medications on file. (Ophthalmic Drugs)   No current facility-administered medications for this visit. (Ophthalmic Drugs)   Current Outpatient Medications (Other)  Medication Sig   acidophilus (RISAQUAD) CAPS capsule Take 1 capsule by mouth daily. Align once daily   cholecalciferol (VITAMIN D3) 25 MCG (1000 UNIT) tablet Take 1,000 Units by mouth daily.   Cyanocobalamin 1000 MCG/ML KIT Inject 1 mL as directed. Every 2 weeks   psyllium (METAMUCIL) 58.6 % powder Take 1 packet by mouth daily.   No current facility-administered medications for this visit. (Other)   REVIEW OF SYSTEMS: ROS   Positive for: Cardiovascular, Eyes Negative for: Constitutional, Gastrointestinal, Neurological, Skin, Genitourinary, Musculoskeletal, HENT, Endocrine, Respiratory, Psychiatric, Allergic/Imm, Heme/Lymph Last edited by Doneen Poisson on 05/22/2021  8:00 AM.     ALLERGIES Allergies  Allergen Reactions   Codone [Hydrocodone] Hives   Nitrous Oxide     Can cause blindness in right eye     Clarithromycin Anxiety   PAST MEDICAL HISTORY Past Medical History:  Diagnosis Date   Celiac disease    Retinal detachment    Past Surgical History:  Procedure Laterality Date   CATARACT EXTRACTION Bilateral    Dr. Valetta Close   EYE SURGERY     FRACTURE SURGERY     GAS INSERTION Right 05/21/2021   Procedure: INSERTION OF GAS - C3F8;  Surgeon: Bernarda Caffey, MD;  Location: [facility];  Service: Ophthalmology;  Laterality: Right;   GAS/FLUID EXCHANGE Right 05/21/2021   Procedure: GAS/FLUID EXCHANGE;  Surgeon: Bernarda Caffey, MD;  Location: [facility];  Service: Ophthalmology;  Laterality: Right;   HERNIA REPAIR     PARS PLANA VITRECTOMY Right 05/21/2021   Procedure: PARS PLANA VITRECTOMY WITH 25 GAUGE;  Surgeon: Bernarda Caffey, MD;  Location: [facility];  Service: Ophthalmology;  Laterality: Right;   PERFLUORONE INJECTION Right 05/21/2021   Procedure: PERFLUORONE INJECTION;  Surgeon: Bernarda Caffey, MD;  Location: [facility];  Service: Ophthalmology;  Laterality: Right;   PHOTOCOAGULATION WITH LASER Right 05/21/2021   Procedure: PHOTOCOAGULATION WITH LASER;  Surgeon: Bernarda Caffey, MD;  Location: [facility];  Service: Ophthalmology;  Laterality: Right;   RETINAL DETACHMENT SURGERY     FAMILY HISTORY History reviewed. No pertinent family history.  SOCIAL HISTORY Social History   Tobacco Use   Smoking status: Former    Types: Cigarettes    Quit date: 01/30/2006    Years since quitting: 15.3   Smokeless tobacco: Never  Vaping Use   Vaping Use: Never used  Substance Use Topics  Alcohol use: Yes    Alcohol/week: 4.0 - 5.0 standard drinks    Types: 4 - 5 Shots of liquor per week    Comment: 3 glasses of whiskey per night.   Drug use: Yes    Frequency: 7.0 times per week    Types: Marijuana       OPHTHALMIC EXAM: Base Eye Exam     Visual Acuity (Snellen - Linear)       Right Left   Dist [place] HM          Tonometry (Tonopen, 8:06 AM)       Right Left   Pressure 17 def         Pupils        Dark Light Shape React APD   Right Dilated       Left 3 2 Round Brisk 0         Visual Fields       Left Right   Restrictions  Total superior temporal, inferior temporal, superior nasal, inferior nasal deficiencies         Extraocular Movement       Right Left    Full Full         Neuro/Psych     Oriented x3: Yes   Mood/Affect: Normal         Dilation     Right eye: 1.0% Mydriacyl, 2.5% Phenylephrine @ 8:06 AM           Slit Lamp and Fundus Exam     Slit Lamp Exam       Right Left   Lids/Lashes Dermatochalasis - upper lid, mild periorbital edema Dermatochalasis - upper lid, mild Meibomian gland dysfunction   Conjunctiva/Sclera Subconjunctival hemorrhage - improving, pneumatic Chemosis, [place] sutures intact White and quiet   Cornea Arcus, central epi defect, endopigment inferiorly, well healed temporal cataract wounds, trace PEE arcus, well healed temporal cataract wounds   Anterior Chamber Deep, 3+ cell and pigment Deep and quiet   Iris Round and dilated Round and dilated   Lens PC IOL in excellent position, trace Posterior capsular opacification PC IOL in good position, trace Posterior capsular opacification   Vitreous Post vitrectomy, good gas fill Vitreous syneresis, no pigment, Posterior vitreous detachment, Weiss ring, prominent linear vitreous condensation in IN quad         Fundus Exam       Right Left   Disc Pink and Sharp, Compact    C/D Ratio 0.2 0.2   Macula Hazy view, grossly attached under gas    Vessels mild attenuation, mild tortuousity    Periphery Retina attached under gas; 360 peripheral laser changes.  Bullous nasal RD from 8088-1103 with small HST at 0130; interval increase in SRF inferiorly extending to inferotemporal quadrant, cryo changes around 0130 tear -- not yet fully mature; early laser changes at 0130            Refraction     Wearing Rx       Sphere Cylinder Axis Add   Right +0.25 Sphere  +2.50   Left -0.50 +0.75  117 +2.50           IMAGING AND PROCEDURES  Imaging and Procedures for 05/22/2021           ASSESSMENT/PLAN:   ICD-10-CM   1. Right retinal detachment  H33.21     2. Retinal edema  H35.81     3. Posterior vitreous detachment of left eye  H43.812  4. Pseudophakia, both eyes  Z96.1      1,2. 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, onset 09.25.22 per pt history - detached from 1230 to 0630, fovea on, small tear at 130 periphery - s/p pneumatic retinopexy OD, 09.27.22 - s/p laser retinopexy OD (10.17.22) to supplement cryo - as gas bubble disappeared, inferior SRF remained and increased             - now POD1 s/p PPV/PFO/EL/FAX/14% C3F8 OD, 10.20.2022  - intra-op new small tear identified at 0400 oclock             - doing well this morning  - good gas fill             - retina attached and in good position -- good buckle height and laser around breaks             - IOP 17             - start   PF 6x/day OD                          zymaxid QID OD                          Atropine BID OD                          Brimonidine BID OD                          PSO ung QID OD              - cont face down positioning x3 days; avoid laying flat on back              - eye shield when sleeping              - post op drop and positioning instructions reviewed              - tylenol/ibuprofen for pain  - f/u next Thursday, 10.27.22 for POW1 visit  3. PVD OS  - acute development of prominent floaters OS -- saw Dr. Katy Fitch on call on Saturday, 10.16.21  - Discussed findings and prognosis  - No RT or RD on 360 scleral depressed exam  - Reviewed s/s of RT/RD  - Strict return precautions for any such RT/RD signs/symptoms   - f/u in 3-4 wks -- DFE/OCT  4. Pseudophakia OU  - s/p CE/IOL OU (Dr. Valetta Close)  - IOLs in good position, doing well  - monitor   Ophthalmic Meds Ordered this visit:  No orders of the defined types were placed in this  encounter.    Return in about 6 days (around 05/28/2021) for POV.  There are no Patient Instructions on file for this visit.  This document serves as a record of services personally performed by Gardiner Sleeper, MD, PhD. It was created on their behalf by Estill Bakes, COT an ophthalmic technician. The creation of this record is the provider's dictation and/or activities during the visit.    Electronically signed by: Estill Bakes, COT 10.21.22 @ 10:15 PM   Gardiner Sleeper, M.D., Ph.D. Diseases & Surgery of the Retina and [place] 10.21.22  I have reviewed the above documentation for accuracy and completeness, and  I agree with the above. Gardiner Sleeper, M.D., Ph.D. 05/22/21 10:15 PM  Abbreviations: M myopia (nearsighted); A astigmatism; H hyperopia (farsighted); P presbyopia; Mrx spectacle prescription;  CTL contact lenses; OD right eye; OS left eye; OU both eyes  XT exotropia; ET esotropia; PEK punctate epithelial keratitis; PEE punctate epithelial erosions; DES dry eye syndrome; MGD meibomian gland dysfunction; ATs artificial tears; PFAT's preservative free artificial tears; [place] nuclear sclerotic cataract; PSC posterior subcapsular cataract; ERM epi-retinal membrane; PVD posterior vitreous detachment; RD retinal detachment; DM diabetes mellitus; DR diabetic retinopathy; NPDR non-proliferative diabetic retinopathy; PDR proliferative diabetic retinopathy; CSME clinically significant macular edema; DME diabetic macular edema; dbh dot blot hemorrhages; CWS cotton wool spot; POAG primary open angle glaucoma; C/D cup-to-disc ratio; HVF humphrey visual field; GVF goldmann visual field; OCT optical coherence tomography; IOP intraocular pressure; BRVO Branch retinal vein occlusion; CRVO central retinal vein occlusion; CRAO central retinal artery occlusion; BRAO branch retinal artery occlusion; RT retinal tear; SB scleral buckle; PPV pars plana vitrectomy; VH Vitreous  hemorrhage; PRP panretinal laser photocoagulation; IVK intravitreal kenalog; VMT vitreomacular traction; MH Macular hole;  NVD neovascularization of the disc; NVE neovascularization elsewhere; AREDS age related eye disease study; ARMD age related macular degeneration; POAG primary open angle glaucoma; EBMD epithelial/anterior basement membrane dystrophy; ACIOL anterior chamber intraocular lens; IOL intraocular lens; PCIOL posterior chamber intraocular lens; Phaco/IOL phacoemulsification with intraocular lens placement; [place] photorefractive keratectomy; LASIK laser assisted in situ keratomileusis; HTN hypertension; DM diabetes mellitus; COPD chronic obstructive pulmonary disease

## 2021-05-25 NOTE — Progress Notes (Signed)
[facility] Note  05/28/2021     CHIEF COMPLAINT Patient presents for Retina Follow Up  HISTORY OF PRESENT ILLNESS: Douglas Cox is a 70 y.o. male who presents to the clinic today for:   HPI     Retina Follow Up   Patient presents with  Other.  In right eye.  This started 6 days ago.  I, the attending physician,  performed the HPI with the patient and updated documentation appropriately.        Comments   Patient here for 6 days retina follow up for RD OD s/p (05-21-21). Patient states vision has some achy pain. Has stabbing pain when using atrophine. The bubble is everywhere. Has used all drops this am except ung.       Last edited by Bernarda Caffey, MD on 05/30/2021  2:25 AM.     Pt states vision is improving slightly, he states atropine burns   Referring physician: No referring provider defined for this encounter.  HISTORICAL INFORMATION:  Selected notes from the [facility] for eval of increased floaters/veil OS   CURRENT MEDICATIONS: No current outpatient medications on file. (Ophthalmic Drugs)   No current facility-administered medications for this visit. (Ophthalmic Drugs)   Current Outpatient Medications (Other)  Medication Sig   acidophilus (RISAQUAD) CAPS capsule Take 1 capsule by mouth daily. Align once daily   cholecalciferol (VITAMIN D3) 25 MCG (1000 UNIT) tablet Take 1,000 Units by mouth daily.   Cyanocobalamin 1000 MCG/ML KIT Inject 1 mL as directed. Every 2 weeks   psyllium (METAMUCIL) 58.6 % powder Take 1 packet by mouth daily.   No current facility-administered medications for this visit. (Other)   REVIEW OF SYSTEMS: ROS   Positive for: Cardiovascular, Eyes Negative for: Constitutional, Gastrointestinal, Neurological, Skin, Genitourinary, Musculoskeletal, HENT, Endocrine, Respiratory, Psychiatric, Allergic/Imm, Heme/Lymph Last edited by Theodore Demark, COA on 05/28/2021  8:22 AM.       ALLERGIES Allergies  Allergen Reactions   Codone [Hydrocodone] Hives   Nitrous Oxide     Can cause blindness in right eye    Clarithromycin Anxiety   PAST MEDICAL HISTORY Past Medical History:  Diagnosis Date   Celiac disease    Retinal detachment    Past Surgical History:  Procedure Laterality Date   CATARACT EXTRACTION Bilateral    Dr. Valetta Close   EYE SURGERY     FRACTURE SURGERY     GAS INSERTION Right 05/21/2021   Procedure: INSERTION OF GAS - C3F8;  Surgeon: Bernarda Caffey, MD;  Location: [facility];  Service: Ophthalmology;  Laterality: Right;   GAS/FLUID EXCHANGE Right 05/21/2021   Procedure: GAS/FLUID EXCHANGE;  Surgeon: Bernarda Caffey, MD;  Location: [facility];  Service: Ophthalmology;  Laterality: Right;   HERNIA REPAIR     PARS PLANA VITRECTOMY Right 05/21/2021   Procedure: PARS PLANA VITRECTOMY WITH 25 GAUGE;  Surgeon: Bernarda Caffey, MD;  Location: [facility];  Service: Ophthalmology;  Laterality: Right;   PERFLUORONE INJECTION Right 05/21/2021   Procedure: PERFLUORONE INJECTION;  Surgeon: Bernarda Caffey, MD;  Location: [facility];  Service: Ophthalmology;  Laterality: Right;   PHOTOCOAGULATION WITH LASER Right 05/21/2021   Procedure: PHOTOCOAGULATION WITH LASER;  Surgeon: Bernarda Caffey, MD;  Location: [facility];  Service: Ophthalmology;  Laterality: Right;   RETINAL DETACHMENT SURGERY     FAMILY HISTORY History reviewed. No pertinent family history.  SOCIAL HISTORY Social History   Tobacco Use   Smoking status: Former    Types: Cigarettes  Quit date: 01/30/2006    Years since quitting: 15.3   Smokeless tobacco: Never  Vaping Use   Vaping Use: Never used  Substance Use Topics   Alcohol use: Yes    Alcohol/week: 4.0 - 5.0 standard drinks    Types: 4 - 5 Shots of liquor per week    Comment: 3 glasses of whiskey per night.   Drug use: Yes    Frequency: 7.0 times per week    Types: Marijuana       OPHTHALMIC EXAM: Base Eye Exam     Visual Acuity (Snellen - Linear)        Right Left   Dist [place] CF at 2'          Tonometry (Tonopen, 8:18 AM)       Right Left   Pressure 17 def         Pupils       Dark   Right Dilated   Left          Visual Fields       Left Right   Restrictions  Total superior temporal, inferior temporal, superior nasal, inferior nasal deficiencies         Extraocular Movement       Right Left    Full Full         Neuro/Psych     Oriented x3: Yes   Mood/Affect: Normal         Dilation     Right eye: 1.0% Mydriacyl, 2.5% Phenylephrine @ 8:18 AM           Slit Lamp and Fundus Exam     Slit Lamp Exam       Right Left   Lids/Lashes Dermatochalasis - upper lid, mild periorbital edema Dermatochalasis - upper lid, mild Meibomian gland dysfunction   Conjunctiva/Sclera Subconjunctival hemorrhage - improving, pneumatic Chemosis, [place] sutures intact White and quiet   Cornea Arcus, central epi defect- closed, endopigment inferiorly, well healed temporal cataract wounds, trace PEE, +heme on inferior endothelium  arcus, well healed temporal cataract wounds   Anterior Chamber Deep, 1+ cell and pigment Deep and quiet   Iris Round and dilated Round and dilated   Lens PC IOL in excellent position, 1+Posterior capsular opacification PC IOL in good position, trace Posterior capsular opacification   Vitreous Post vitrectomy, 90-95% Vitreous syneresis, no pigment, Posterior vitreous detachment, Weiss ring, prominent linear vitreous condensation in IN quad         Fundus Exam       Right Left   Disc Pink and Sharp, Compact    C/D Ratio 0.2 0.2   Macula Hazy view, grossly attached under gas    Vessels mild attenuation, mild tortuousity    Periphery Retina attached under gas; 360 peripheral laser changes.  Bullous nasal RD from 0938-1829 with small HST at 0130; interval increase in SRF inferiorly extending to inferotemporal quadrant, cryo changes around 0130 tear -- not yet fully mature; early laser changes at [place] and Procedures for 05/28/2021           ASSESSMENT/PLAN:   ICD-10-CM   1. Right retinal detachment  H33.21     2. Retinal edema  H35.81     3. Posterior vitreous detachment of left eye  H43.812     4. Pseudophakia, both eyes  Z96.1      1,2. Rhegmatogenous retinal detachment, OD -  bullous, nasal, mac on detachment, onset 09.25.22 per pt history - detached from 1230 to 0630, fovea on, small tear at 130 periphery - s/p pneumatic retinopexy OD, 09.27.22 - s/p laser retinopexy OD (10.17.22) to supplement cryo - as gas bubble disappeared, inferior SRF remained and increased             - POW1 s/p PPV/PFO/EL/FAX/14% C3F8 OD, 10.20.2022  - intra-op new small tear identified at 0400 oclock             - doing well   - good gas fill             - retina attached and in good position -- good buckle height and laser around breaks             - IOP 17  - persistent fibrin and cell in AC             - cont   PF 6x/day OD -- increase to Q2H                         zymaxid QID OD                          Atropine BID OD                          Brimonidine BID OD                          PSO ung QID OD -- switch to Maxitrol ung             - cont face down positioning 30 min per hr; avoid laying flat on back              - eye shield when sleeping              - post op drop and positioning instructions reviewed              - tylenol/ibuprofen for pain  - f/u next Thursday, Nov 3  3. PVD OS  - acute development of prominent floaters OS -- saw Dr. Katy Fitch on call on Saturday, 10.16.21  - Discussed findings and prognosis  - No RT or RD on 360 scleral depressed exam  - Reviewed s/s of RT/RD  - Strict return precautions for any such RT/RD signs/symptoms   - f/u in 3-4 wks -- DFE/OCT  4. Pseudophakia OU  - s/p CE/IOL OU (Dr. Valetta Close)  - IOLs in good position, doing well  - monitor   Ophthalmic Meds Ordered this visit:  No orders of  the defined types were placed in this encounter.    Return in about 1 week (around 06/04/2021) for f/u RD OD, DFE, OCT.  There are no Patient Instructions on file for this visit.  This document serves as a record of services personally performed by Gardiner Sleeper, MD, PhD. It was created on their behalf by San Jetty. Owens Shark, OA an ophthalmic technician. The creation of this record is the provider's dictation and/or activities during the visit.    Electronically signed by: San Jetty. [place], [place] 10.24.2022 2:27 AM  Gardiner Sleeper, M.D., Ph.D. Diseases & Surgery of the Retina and Vitreous [facility]  I have reviewed the above documentation for accuracy and completeness, and I  agree with the above. Gardiner Sleeper, M.D., Ph.D. 05/30/21 2:27 AM  Abbreviations: M myopia (nearsighted); A astigmatism; H hyperopia (farsighted); P presbyopia; Mrx spectacle prescription;  CTL contact lenses; OD right eye; OS left eye; OU both eyes  XT exotropia; ET esotropia; PEK punctate epithelial keratitis; PEE punctate epithelial erosions; DES dry eye syndrome; MGD meibomian gland dysfunction; ATs artificial tears; PFAT's preservative free artificial tears; [place] nuclear sclerotic cataract; PSC posterior subcapsular cataract; ERM epi-retinal membrane; PVD posterior vitreous detachment; RD retinal detachment; DM diabetes mellitus; DR diabetic retinopathy; NPDR non-proliferative diabetic retinopathy; PDR proliferative diabetic retinopathy; CSME clinically significant macular edema; DME diabetic macular edema; dbh dot blot hemorrhages; CWS cotton wool spot; POAG primary open angle glaucoma; C/D cup-to-disc ratio; HVF humphrey visual field; GVF goldmann visual field; OCT optical coherence tomography; IOP intraocular pressure; BRVO Branch retinal vein occlusion; CRVO central retinal vein occlusion; CRAO central retinal artery occlusion; BRAO branch retinal artery occlusion; RT retinal tear; SB scleral  buckle; PPV pars plana vitrectomy; VH Vitreous hemorrhage; PRP panretinal laser photocoagulation; IVK intravitreal kenalog; VMT vitreomacular traction; MH Macular hole;  NVD neovascularization of the disc; NVE neovascularization elsewhere; AREDS age related eye disease study; ARMD age related macular degeneration; POAG primary open angle glaucoma; EBMD epithelial/anterior basement membrane dystrophy; ACIOL anterior chamber intraocular lens; IOL intraocular lens; PCIOL posterior chamber intraocular lens; Phaco/IOL phacoemulsification with intraocular lens placement; [place] photorefractive keratectomy; LASIK laser assisted in situ keratomileusis; HTN hypertension; DM diabetes mellitus; COPD chronic obstructive pulmonary disease

## 2021-05-28 ENCOUNTER — Ambulatory Visit (INDEPENDENT_AMBULATORY_CARE_PROVIDER_SITE_OTHER): Payer: Medicare Other | Admitting: Ophthalmology

## 2021-05-28 ENCOUNTER — Other Ambulatory Visit: Payer: Self-pay

## 2021-05-28 DIAGNOSIS — Z961 Presence of intraocular lens: Secondary | ICD-10-CM

## 2021-05-28 DIAGNOSIS — H43812 Vitreous degeneration, left eye: Secondary | ICD-10-CM

## 2021-05-28 DIAGNOSIS — H3581 Retinal edema: Secondary | ICD-10-CM

## 2021-05-28 DIAGNOSIS — H3321 Serous retinal detachment, right eye: Secondary | ICD-10-CM

## 2021-05-30 ENCOUNTER — Encounter (INDEPENDENT_AMBULATORY_CARE_PROVIDER_SITE_OTHER): Payer: Self-pay | Admitting: Ophthalmology

## 2021-06-02 NOTE — Progress Notes (Signed)
[facility] Note  06/04/2021     CHIEF COMPLAINT Patient presents for Post-op Follow-up  HISTORY OF PRESENT ILLNESS: Douglas Cox is a 70 y.o. male who presents to the clinic today for:   HPI     Post-op Follow-up   In right eye.  I, the attending physician,  performed the HPI with the patient and updated documentation appropriately.        Comments   2 week post op OD-  Patient states eye is about where it should be.  Eye does ache once and awhile.  He believes there is a suture in the eye around 2 o'clock area that only bothers him if he rubs the eye. Pred q2 hours, PSO ung QID, Brimonidine BID OD.  Hard to tell if the bubble has improved, maybe a little if any.       Last edited by Bernarda Caffey, MD on 06/04/2021  8:14 AM.      Patient states OD aches once in awhile.  Referring physician: No referring provider defined for this encounter.  HISTORICAL INFORMATION:  Selected notes from the [facility] for eval of increased floaters/veil OS   CURRENT MEDICATIONS: Current Outpatient Medications (Ophthalmic Drugs)  Medication Sig   prednisoLONE acetate (PRED FORTE) 1 % ophthalmic suspension Place 1 drop into the left eye 4 (four) times daily.   No current facility-administered medications for this visit. (Ophthalmic Drugs)   Current Outpatient Medications (Other)  Medication Sig   acidophilus (RISAQUAD) CAPS capsule Take 1 capsule by mouth daily. Align once daily   cholecalciferol (VITAMIN D3) 25 MCG (1000 UNIT) tablet Take 1,000 Units by mouth daily.   Cyanocobalamin 1000 MCG/ML KIT Inject 1 mL as directed. Every 2 weeks   psyllium (METAMUCIL) 58.6 % powder Take 1 packet by mouth daily.   No current facility-administered medications for this visit. (Other)   REVIEW OF SYSTEMS: ROS   Positive for: Cardiovascular, Eyes Negative for: Constitutional, Gastrointestinal, Neurological, Skin, Genitourinary,  Musculoskeletal, HENT, Endocrine, Respiratory, Psychiatric, Allergic/Imm, Heme/Lymph Last edited by Leonie Douglas, COA on 06/04/2021  8:01 AM.       ALLERGIES Allergies  Allergen Reactions   Codone [Hydrocodone] Hives   Nitrous Oxide     Can cause blindness in right eye    Clarithromycin Anxiety   PAST MEDICAL HISTORY Past Medical History:  Diagnosis Date   Celiac disease    Retinal detachment    Past Surgical History:  Procedure Laterality Date   CATARACT EXTRACTION Bilateral    Dr. Valetta Close   EYE SURGERY     FRACTURE SURGERY     GAS INSERTION Right 05/21/2021   Procedure: INSERTION OF GAS - C3F8;  Surgeon: Bernarda Caffey, MD;  Location: [facility];  Service: Ophthalmology;  Laterality: Right;   GAS/FLUID EXCHANGE Right 05/21/2021   Procedure: GAS/FLUID EXCHANGE;  Surgeon: Bernarda Caffey, MD;  Location: [facility];  Service: Ophthalmology;  Laterality: Right;   HERNIA REPAIR     PARS PLANA VITRECTOMY Right 05/21/2021   Procedure: PARS PLANA VITRECTOMY WITH 25 GAUGE;  Surgeon: Bernarda Caffey, MD;  Location: [facility];  Service: Ophthalmology;  Laterality: Right;   PERFLUORONE INJECTION Right 05/21/2021   Procedure: PERFLUORONE INJECTION;  Surgeon: Bernarda Caffey, MD;  Location: [facility];  Service: Ophthalmology;  Laterality: Right;   PHOTOCOAGULATION WITH LASER Right 05/21/2021   Procedure: PHOTOCOAGULATION WITH LASER;  Surgeon: Bernarda Caffey, MD;  Location: [facility];  Service: Ophthalmology;  Laterality: Right;  RETINAL DETACHMENT SURGERY     FAMILY HISTORY History reviewed. No pertinent family history.  SOCIAL HISTORY Social History   Tobacco Use   Smoking status: Former    Types: Cigarettes    Quit date: 01/30/2006    Years since quitting: 15.3   Smokeless tobacco: Never  Vaping Use   Vaping Use: Never used  Substance Use Topics   Alcohol use: Yes    Alcohol/week: 4.0 - 5.0 standard drinks    Types: 4 - 5 Shots of liquor per week    Comment: 3 glasses of whiskey per night.   Drug use:  Yes    Frequency: 7.0 times per week    Types: Marijuana       OPHTHALMIC EXAM: Base Eye Exam     Visual Acuity (Snellen - Linear)       Right Left   Dist [place] CF 2'          Tonometry (Tonopen, 8:05 AM)       Right Left   Pressure 27          Tonometry #2 (Tonopen, 8:05 AM)       Right Left   Pressure 27          Pupils       Dark Light Shape React   Right 4 4 Round NR   Left             Visual Fields (Counting fingers)       Left Right   Restrictions  Total superior temporal, inferior temporal, superior nasal, inferior nasal deficiencies         Neuro/Psych     Oriented x3: Yes   Mood/Affect: Normal         Dilation     Right eye: 1.0% Mydriacyl, 2.5% Phenylephrine @ 8:06 AM           Slit Lamp and Fundus Exam     Slit Lamp Exam       Right Left   Lids/Lashes Dermatochalasis - upper lid, mild periorbital edema Dermatochalasis - upper lid, mild Meibomian gland dysfunction   Conjunctiva/Sclera Subconjunctival hemorrhage - resolved, sutures dissolving White and quiet   Cornea Arcus, central epi defect- closed, endopigment inferiorly, well healed temporal cataract wounds, trace PEE, +heme on inferior endothelium--resolved , Guttata arcus, well healed temporal cataract wounds   Anterior Chamber Deep, 1+ cell and pigment Deep and quiet   Iris Round and dilated Round and dilated   Lens PC IOL in excellent position, 1+Posterior capsular opacification PC IOL in good position, trace Posterior capsular opacification   Vitreous Post vitrectomy, 75% bubble Vitreous syneresis, no pigment, Posterior vitreous detachment, Weiss ring, prominent linear vitreous condensation in IN quad         Fundus Exam       Right Left   Disc hazy view, perfused    C/D Ratio 0.2 0.2   Macula attached under gas    Vessels mild attenuation, mild tortuousity    Periphery inferior retina attached, superior retina attached under bubble; 360 peripheral laser changes.  ORIGINALLY: Bullous nasal RD from 9563-8756 with small HST at 0130; good cryo changes around 0130 tear            IMAGING AND PROCEDURES  Imaging and Procedures for 06/04/2021           ASSESSMENT/PLAN:   ICD-10-CM   1. Right retinal detachment  H33.21 prednisoLONE acetate (PRED FORTE) 1 % ophthalmic suspension  2. Retinal edema  H35.81     3. Posterior vitreous detachment of left eye  H43.812     4. Pseudophakia, both eyes  Z96.1      1,2. 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  - fibrin and cell in AC improved             - cont   PF decrease to 4 to 6 times daily                         Finished zymaxid                         Finished atropine                         Brimonidine BID OD                         Maxitrol prn and qhs OD             - cont face down positioning 30 min per hr; avoid laying flat on back              - eye shield when sleeping              - post op drop and positioning instructions reviewed              - tylenol/ibuprofen for pain  - f/u 2 weeks  3. PVD OS  - acute development of prominent floaters OS -- saw Dr. Katy Fitch on call on Saturday, 10.16.21  - Discussed findings and prognosis  - No RT or RD on 360 scleral depressed exam  - Reviewed s/s of RT/RD  - Strict return precautions for any such RT/RD signs/symptoms   - f/u in 3-4 wks --  DFE/OCT  4. Pseudophakia OU  - s/p CE/IOL OU (Dr. Valetta Close)  - IOLs in good position, doing well  - monitor   Ophthalmic Meds Ordered this visit:  Meds ordered this encounter  Medications   prednisoLONE acetate (PRED FORTE) 1 % ophthalmic suspension    Sig: Place 1 drop into the left eye 4 (four) times daily.    Dispense:  10 mL    Refill:  0     Return in about 2 weeks (around 06/18/2021) for DFE, OCT.  There are no Patient Instructions on file for this visit.  This document serves as a record of services personally performed by Gardiner Sleeper, MD, PhD. It was created on their behalf by San Jetty. Owens Shark, OA  an ophthalmic technician. The creation of this record is the provider's dictation and/or activities during the visit.    Electronically signed by: San Jetty. Owens Shark, OA 11.01.2022 1:19 AM  This document serves as a record of services personally performed by Gardiner Sleeper, MD, PhD. It was created on their behalf by Roselee Nova, COMT. The creation of this record is the provider's dictation and/or activities during the visit.  Electronically signed by: Roselee Nova, COMT 06/07/21 1:19 AM  Gardiner Sleeper, M.D., Ph.D. Diseases & Surgery of the Retina and Vitreous [facility]  I have reviewed the above documentation for accuracy and completeness, and I agree with the above. Gardiner Sleeper, M.D., Ph.D. 06/07/21 1:19 AM   Abbreviations: M myopia (nearsighted); A astigmatism; H hyperopia (farsighted); P presbyopia; Mrx spectacle prescription;  CTL contact lenses; OD right eye; OS left eye; OU both eyes  XT exotropia; ET esotropia; PEK punctate epithelial keratitis; PEE punctate epithelial erosions; DES dry eye syndrome; MGD meibomian gland dysfunction; ATs artificial tears; PFAT's preservative free artificial tears; [place] nuclear sclerotic cataract; PSC posterior subcapsular cataract; ERM epi-retinal membrane; PVD posterior vitreous detachment; RD retinal  detachment; DM diabetes mellitus; DR diabetic retinopathy; NPDR non-proliferative diabetic retinopathy; PDR proliferative diabetic retinopathy; CSME clinically significant macular edema; DME diabetic macular edema; dbh dot blot hemorrhages; CWS cotton wool spot; POAG primary open angle glaucoma; C/D cup-to-disc ratio; HVF humphrey visual field; GVF goldmann visual field; OCT optical coherence tomography; IOP intraocular pressure; BRVO Branch retinal vein occlusion; CRVO central retinal vein occlusion; CRAO central retinal artery occlusion; BRAO branch retinal artery occlusion; RT retinal tear; SB scleral buckle; PPV pars plana vitrectomy; VH Vitreous hemorrhage; PRP panretinal laser photocoagulation; IVK intravitreal kenalog; VMT vitreomacular traction; MH Macular hole;  NVD neovascularization of the disc; NVE neovascularization elsewhere; AREDS age related eye disease study; ARMD age related macular degeneration; POAG primary open angle glaucoma; EBMD epithelial/anterior basement membrane dystrophy; ACIOL anterior chamber intraocular lens; IOL intraocular lens; PCIOL posterior chamber intraocular lens; Phaco/IOL phacoemulsification with intraocular lens placement; [place] photorefractive keratectomy; LASIK laser assisted in situ keratomileusis; HTN hypertension; DM diabetes mellitus; COPD chronic obstructive pulmonary disease

## 2021-06-04 ENCOUNTER — Ambulatory Visit (INDEPENDENT_AMBULATORY_CARE_PROVIDER_SITE_OTHER): Payer: Medicare Other | Admitting: Ophthalmology

## 2021-06-04 ENCOUNTER — Encounter (INDEPENDENT_AMBULATORY_CARE_PROVIDER_SITE_OTHER): Payer: Self-pay | Admitting: Ophthalmology

## 2021-06-04 ENCOUNTER — Other Ambulatory Visit: Payer: Self-pay

## 2021-06-04 DIAGNOSIS — H43812 Vitreous degeneration, left eye: Secondary | ICD-10-CM

## 2021-06-04 DIAGNOSIS — H3321 Serous retinal detachment, right eye: Secondary | ICD-10-CM

## 2021-06-04 DIAGNOSIS — Z961 Presence of intraocular lens: Secondary | ICD-10-CM

## 2021-06-04 DIAGNOSIS — H3581 Retinal edema: Secondary | ICD-10-CM

## 2021-06-05 MED ORDER — PREDNISOLONE ACETATE 1 % OP SUSP: 1.0000 [drp] | Freq: Four times a day (QID) | OPHTHALMIC | 0 refills | Status: DC

## 2021-06-17 ENCOUNTER — Ambulatory Visit (INDEPENDENT_AMBULATORY_CARE_PROVIDER_SITE_OTHER): Payer: Medicare Other | Admitting: Ophthalmology

## 2021-06-17 ENCOUNTER — Other Ambulatory Visit: Payer: Self-pay

## 2021-06-17 ENCOUNTER — Encounter (INDEPENDENT_AMBULATORY_CARE_PROVIDER_SITE_OTHER): Payer: Self-pay | Admitting: Ophthalmology

## 2021-06-17 DIAGNOSIS — H43812 Vitreous degeneration, left eye: Secondary | ICD-10-CM

## 2021-06-17 DIAGNOSIS — H3321 Serous retinal detachment, right eye: Secondary | ICD-10-CM

## 2021-06-17 DIAGNOSIS — Z961 Presence of intraocular lens: Secondary | ICD-10-CM

## 2021-06-17 DIAGNOSIS — H3581 Retinal edema: Secondary | ICD-10-CM

## 2021-06-17 NOTE — Progress Notes (Signed)
[facility] Note  06/17/2021     CHIEF COMPLAINT Patient presents for Post-op Follow-up  HISTORY OF PRESENT ILLNESS: Douglas Cox is a 70 y.o. male who presents to the clinic today for:   HPI     Post-op Follow-up   In right eye.  I, the attending physician,  performed the HPI with the patient and updated documentation appropriately.        Comments   4 week post op OD- Bubble is getting smaller and eye is not as achy as before.  Prednisolone 5x/d, Brimonidine BID, ung qhs OD      Last edited by Bernarda Caffey, MD on 06/18/2021 10:09 AM.    Pt states he is now able to "peek" above the bubble, he is using PF 5x/day, brimonidine BID and ointment only at bedtime  Referring physician: No referring provider defined for this encounter.  HISTORICAL INFORMATION:  Selected notes from the [facility] for eval of increased floaters/veil OS   CURRENT MEDICATIONS: Current Outpatient Medications (Ophthalmic Drugs)  Medication Sig   prednisoLONE acetate (PRED FORTE) 1 % ophthalmic suspension Place 1 drop into the left eye 4 (four) times daily.   No current facility-administered medications for this visit. (Ophthalmic Drugs)   Current Outpatient Medications (Other)  Medication Sig   acidophilus (RISAQUAD) CAPS capsule Take 1 capsule by mouth daily. Align once daily   cholecalciferol (VITAMIN D3) 25 MCG (1000 UNIT) tablet Take 1,000 Units by mouth daily.   Cyanocobalamin 1000 MCG/ML KIT Inject 1 mL as directed. Every 2 weeks   psyllium (METAMUCIL) 58.6 % powder Take 1 packet by mouth daily.   No current facility-administered medications for this visit. (Other)   REVIEW OF SYSTEMS: ROS   Positive for: Cardiovascular, Eyes Negative for: Constitutional, Gastrointestinal, Neurological, Skin, Genitourinary, Musculoskeletal, HENT, Endocrine, Respiratory, Psychiatric, Allergic/Imm, Heme/Lymph Last edited by Leonie Douglas, COA  on 06/17/2021  8:10 AM.        ALLERGIES Allergies  Allergen Reactions   Codone [Hydrocodone] Hives   Nitrous Oxide     Can cause blindness in right eye    Clarithromycin Anxiety   PAST MEDICAL HISTORY Past Medical History:  Diagnosis Date   Celiac disease    Retinal detachment    Past Surgical History:  Procedure Laterality Date   CATARACT EXTRACTION Bilateral    Dr. Valetta Close   EYE SURGERY     FRACTURE SURGERY     GAS INSERTION Right 05/21/2021   Procedure: INSERTION OF GAS - C3F8;  Surgeon: Bernarda Caffey, MD;  Location: [facility];  Service: Ophthalmology;  Laterality: Right;   GAS/FLUID EXCHANGE Right 05/21/2021   Procedure: GAS/FLUID EXCHANGE;  Surgeon: Bernarda Caffey, MD;  Location: [facility];  Service: Ophthalmology;  Laterality: Right;   HERNIA REPAIR     PARS PLANA VITRECTOMY Right 05/21/2021   Procedure: PARS PLANA VITRECTOMY WITH 25 GAUGE;  Surgeon: Bernarda Caffey, MD;  Location: [facility];  Service: Ophthalmology;  Laterality: Right;   PERFLUORONE INJECTION Right 05/21/2021   Procedure: PERFLUORONE INJECTION;  Surgeon: Bernarda Caffey, MD;  Location: [facility];  Service: Ophthalmology;  Laterality: Right;   PHOTOCOAGULATION WITH LASER Right 05/21/2021   Procedure: PHOTOCOAGULATION WITH LASER;  Surgeon: Bernarda Caffey, MD;  Location: [facility];  Service: Ophthalmology;  Laterality: Right;   RETINAL DETACHMENT SURGERY     FAMILY HISTORY History reviewed. No pertinent family history.  SOCIAL HISTORY Social History   Tobacco Use   Smoking status: Former  Types: Cigarettes    Quit date: 01/30/2006    Years since quitting: 15.3   Smokeless tobacco: Never  Vaping Use   Vaping Use: Never used  Substance Use Topics   Alcohol use: Yes    Alcohol/week: 4.0 - 5.0 standard drinks    Types: 4 - 5 Shots of liquor per week    Comment: 3 glasses of whiskey per night.   Drug use: Yes    Frequency: 7.0 times per week    Types: Marijuana       OPHTHALMIC EXAM: Base Eye Exam     Visual  Acuity (Snellen - Linear)       Right Left   Dist [place] 20/40 +1 def   Dist ph [place] 20/40 +2          Tonometry (Tonopen, 8:14 AM)       Right Left   Pressure 19 def         Pupils       Dark Light Shape React   Right 4 4 Round NR   Left             Visual Fields       Left Right   Restrictions  Total inferior temporal, inferior nasal deficiencies         Neuro/Psych     Oriented x3: Yes   Mood/Affect: Normal         Dilation     Right eye: 1.0% Mydriacyl, 2.5% Phenylephrine @ 8:14 AM           Slit Lamp and Fundus Exam     Slit Lamp Exam       Right Left   Lids/Lashes Dermatochalasis - upper lid, mild periorbital edema Dermatochalasis - upper lid, mild Meibomian gland dysfunction   Conjunctiva/Sclera Subconjunctival hemorrhage - resolved, sutures dissolving White and quiet   Cornea Arcus, well healed temporal cataract wounds, 1+ inferior PEE arcus, well healed temporal cataract wounds   Anterior Chamber Deep, 1-2+ cell and pigment Deep and quiet   Iris Round and dilated Round and dilated   Lens PC IOL in excellent position, 1+Posterior capsular opacification PC IOL in good position, trace Posterior capsular opacification   Anterior Vitreous Post vitrectomy, 50% bubble Vitreous syneresis, no pigment, Posterior vitreous detachment, Weiss ring, prominent linear vitreous condensation in IN quad         Fundus Exam       Right Left   Disc Pink and Sharp    C/D Ratio 0.2 0.2   Macula Flat    Vessels mild attenuation, mild tortuousity    Periphery inferior retina attached, superior retina attached under bubble; 360 peripheral laser changes. ORIGINALLY: Bullous nasal RD from 8185-6314 with small HST at 0130; good cryo changes around 0130 tear            IMAGING AND PROCEDURES  Imaging and Procedures for 06/17/2021  OCT, Retina - OU - Both Eyes       Right Eye Quality was good. Central Foveal Thickness: 302. Progression has improved. Findings  include normal foveal contour, no IRF, vitreomacular adhesion , no SRF (Inferior retina re-attached, superior macula blurry from gas bubble).   Left Eye Quality was good. Central Foveal Thickness: 262. Progression has been stable. Findings include normal foveal contour, no IRF, no SRF (Mild vitreous opacities).   Notes *Images captured and stored on drive  Diagnosis / Impression:  OD: NFP, no IRF; Inferior retina re-attached, superior macula blurry from gas bubble  OS: NFP, no IRF/SRF  Clinical management:  See below  Abbreviations: NFP - Normal foveal profile. CME - cystoid macular edema. PED - pigment epithelial detachment. IRF - intraretinal fluid. SRF - subretinal fluid. EZ - ellipsoid zone. ERM - epiretinal membrane. ORA - outer retinal atrophy. ORT - outer retinal tubulation. SRHM - subretinal hyper-reflective material. IRHM - intraretinal hyper-reflective material              ASSESSMENT/PLAN:   ICD-10-CM   1. Right retinal detachment  H33.21 OCT, Retina - OU - Both Eyes    2. Retinal edema  H35.81     3. Posterior vitreous detachment of left eye  H43.812     4. Pseudophakia, both eyes  Z96.1       1,2. 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, onset 09.25.22 per pt history - detached from 1230 to 0630, fovea on, small tear at 130 periphery - s/p pneumatic retinopexy OD, 09.27.22 - s/p laser retinopexy OD (10.17.22) to supplement cryo - as gas bubble disappeared, inferior SRF remained and increased             - POW4 s/p PPV/PFO/EL/FAX/14% C3F8 OD, 10.20.2022  - intra-op new small tear identified at 0400 oclock             - doing well   - gas bubble shrinking and inferior retina reattached with good laser changes in place             - IOP 19             - cont   PF decrease to 4 times daily                         Brimonidine BID OD                         Maxitrol prn and qhs OD             - cont face down positioning 30  min per hr for 2 more weeks; avoid laying flat on back             - post op drop and positioning instructions reviewed              - tylenol/ibuprofen for pain  - f/u 3-4 weeks, DFE, OCT  3. PVD OS  - acute development of prominent floaters OS -- saw Dr. Katy Fitch on call on Saturday, 10.16.21  - Discussed findings and prognosis  - No RT or RD on 360 scleral depressed exam  - Reviewed s/s of RT/RD  - Strict return precautions for any such RT/RD signs/symptoms   - f/u in 3-4 wks -- DFE/OCT  4. Pseudophakia OU  - s/p CE/IOL OU (Dr. Valetta Close)  - IOLs in good position, doing well  - monitor   Ophthalmic Meds Ordered this visit:  No orders of the defined types were placed in this encounter.    Return for f/u 3-4 weeks, RD OD, DFE, OCT.  There are no Patient Instructions on file for this visit.  This document serves as a record of services personally performed by Gardiner Sleeper, MD, PhD. It was created on their behalf by Orvan Falconer, an ophthalmic technician. The creation of this record is the provider's dictation and/or activities during the visit.    Electronically signed by: Orvan Falconer, OA, 06/18/21  10:12 AM  This document  serves as a record of services personally performed by Gardiner Sleeper, MD, PhD. It was created on their behalf by San Jetty. Owens Shark, OA an ophthalmic technician. The creation of this record is the provider's dictation and/or activities during the visit.    Electronically signed by: San Jetty. Owens Shark, [place] 11.16.2022 10:12 AM  Gardiner Sleeper, M.D., Ph.D. Diseases & Surgery of the Retina and Vitreous [facility]  I have reviewed the above documentation for accuracy and completeness, and I agree with the above. Gardiner Sleeper, M.D., Ph.D. 06/18/21 10:12 AM  Abbreviations: M myopia (nearsighted); A astigmatism; H hyperopia (farsighted); P presbyopia; Mrx spectacle prescription;  CTL contact lenses; OD right eye; OS left eye; OU both  eyes  XT exotropia; ET esotropia; PEK punctate epithelial keratitis; PEE punctate epithelial erosions; DES dry eye syndrome; MGD meibomian gland dysfunction; ATs artificial tears; PFAT's preservative free artificial tears; [place] nuclear sclerotic cataract; PSC posterior subcapsular cataract; ERM epi-retinal membrane; PVD posterior vitreous detachment; RD retinal detachment; DM diabetes mellitus; DR diabetic retinopathy; NPDR non-proliferative diabetic retinopathy; PDR proliferative diabetic retinopathy; CSME clinically significant macular edema; DME diabetic macular edema; dbh dot blot hemorrhages; CWS cotton wool spot; POAG primary open angle glaucoma; C/D cup-to-disc ratio; HVF humphrey visual field; GVF goldmann visual field; OCT optical coherence tomography; IOP intraocular pressure; BRVO Branch retinal vein occlusion; CRVO central retinal vein occlusion; CRAO central retinal artery occlusion; BRAO branch retinal artery occlusion; RT retinal tear; SB scleral buckle; PPV pars plana vitrectomy; VH Vitreous hemorrhage; PRP panretinal laser photocoagulation; IVK intravitreal kenalog; VMT vitreomacular traction; MH Macular hole;  NVD neovascularization of the disc; NVE neovascularization elsewhere; AREDS age related eye disease study; ARMD age related macular degeneration; POAG primary open angle glaucoma; EBMD epithelial/anterior basement membrane dystrophy; ACIOL anterior chamber intraocular lens; IOL intraocular lens; PCIOL posterior chamber intraocular lens; Phaco/IOL phacoemulsification with intraocular lens placement; [place] photorefractive keratectomy; LASIK laser assisted in situ keratomileusis; HTN hypertension; DM diabetes mellitus; COPD chronic obstructive pulmonary disease

## 2021-06-18 ENCOUNTER — Encounter (INDEPENDENT_AMBULATORY_CARE_PROVIDER_SITE_OTHER): Payer: Self-pay | Admitting: Ophthalmology

## 2021-07-06 NOTE — Progress Notes (Signed)
[facility] Note  07/08/2021     CHIEF COMPLAINT Patient presents for Post-op Follow-up  HISTORY OF PRESENT ILLNESS: Douglas Cox is a 70 y.o. male who presents to the clinic today for:   HPI     Post-op Follow-up   In right eye.  Discomfort includes none.  I, the attending physician,  performed the HPI with the patient and updated documentation appropriately.        Comments   7 week post op RD OD- Bubble is getting smaller and located down at the bottom states pt.  Vision around the bubble seem to be ok. Using Brimonidine BID and Prednisolone QID, Maxitrol qhs when he remembers.      Last edited by Bernarda Caffey, MD on 07/08/2021 11:14 PM.     Pt states gas bubble is still shrinking and vision is improving  Referring physician: No referring provider defined for this encounter.  HISTORICAL INFORMATION:  Selected notes from the [facility] for eval of increased floaters/veil OS   CURRENT MEDICATIONS: Current Outpatient Medications (Ophthalmic Drugs)  Medication Sig   prednisoLONE acetate (PRED FORTE) 1 % ophthalmic suspension Place 1 drop into the left eye 4 (four) times daily.   No current facility-administered medications for this visit. (Ophthalmic Drugs)   Current Outpatient Medications (Other)  Medication Sig   acidophilus (RISAQUAD) CAPS capsule Take 1 capsule by mouth daily. Align once daily   cholecalciferol (VITAMIN D3) 25 MCG (1000 UNIT) tablet Take 1,000 Units by mouth daily.   Cyanocobalamin 1000 MCG/ML KIT Inject 1 mL as directed. Every 2 weeks   psyllium (METAMUCIL) 58.6 % powder Take 1 packet by mouth daily.   No current facility-administered medications for this visit. (Other)   REVIEW OF SYSTEMS: ROS   Positive for: Cardiovascular, Eyes Negative for: Constitutional, Gastrointestinal, Neurological, Skin, Genitourinary, Musculoskeletal, HENT, Endocrine, Respiratory, Psychiatric,  Allergic/Imm, Heme/Lymph Last edited by Leonie Douglas, COA on 07/08/2021  1:41 PM.     ALLERGIES Allergies  Allergen Reactions   Codone [Hydrocodone] Hives   Nitrous Oxide     Can cause blindness in right eye    Tylenol [Acetaminophen] Hives   Clarithromycin Anxiety   PAST MEDICAL HISTORY Past Medical History:  Diagnosis Date   Celiac disease    Retinal detachment    Past Surgical History:  Procedure Laterality Date   CATARACT EXTRACTION Bilateral    Dr. Valetta Close   EYE SURGERY     FRACTURE SURGERY     GAS INSERTION Right 05/21/2021   Procedure: INSERTION OF GAS - C3F8;  Surgeon: Bernarda Caffey, MD;  Location: [facility];  Service: Ophthalmology;  Laterality: Right;   GAS/FLUID EXCHANGE Right 05/21/2021   Procedure: GAS/FLUID EXCHANGE;  Surgeon: Bernarda Caffey, MD;  Location: [facility];  Service: Ophthalmology;  Laterality: Right;   HERNIA REPAIR     PARS PLANA VITRECTOMY Right 05/21/2021   Procedure: PARS PLANA VITRECTOMY WITH 25 GAUGE;  Surgeon: Bernarda Caffey, MD;  Location: [facility];  Service: Ophthalmology;  Laterality: Right;   PERFLUORONE INJECTION Right 05/21/2021   Procedure: PERFLUORONE INJECTION;  Surgeon: Bernarda Caffey, MD;  Location: [place];  Service: Ophthalmology;  Laterality: Right;   PHOTOCOAGULATION WITH LASER Right 05/21/2021   Procedure: PHOTOCOAGULATION WITH LASER;  Surgeon: Bernarda Caffey, MD;  Location: [facility];  Service: Ophthalmology;  Laterality: Right;   RETINAL DETACHMENT SURGERY     FAMILY HISTORY History reviewed. No pertinent family history.  SOCIAL HISTORY Social History  Tobacco Use   Smoking status: Former    Types: Cigarettes    Quit date: 01/30/2006    Years since quitting: 15.4   Smokeless tobacco: Never  Vaping Use   Vaping Use: Never used  Substance Use Topics   Alcohol use: Yes    Alcohol/week: 4.0 - 5.0 standard drinks    Types: 4 - 5 Shots of liquor per week    Comment: 3 glasses of whiskey per night.   Drug use: Yes    Frequency: 7.0 times  per week    Types: Marijuana       OPHTHALMIC EXAM: Base Eye Exam     Visual Acuity (Snellen - Linear)       Right Left   Dist [place] 20/40 20/20   Dist ph [place] 20/25 +2   OD squinting         Tonometry (Tonopen, 1:48 PM)       Right Left   Pressure 24 17         Tonometry #2 (Tonopen, 1:48 PM)       Right Left   Pressure 24          Pupils       Dark Light Shape React APD   Right 5 4 Round Brisk None   Left 3 2 Round Brisk None         Visual Fields (Counting fingers)       Left Right    Full Full         Extraocular Movement       Right Left    Full Full         Neuro/Psych     Oriented x3: Yes   Mood/Affect: Normal         Dilation     Both eyes: 1.0% Mydriacyl, 2.5% Phenylephrine @ 1:49 PM           Slit Lamp and Fundus Exam     Slit Lamp Exam       Right Left   Lids/Lashes Dermatochalasis - upper lid, mild periorbital edema Dermatochalasis - upper lid, mild Meibomian gland dysfunction   Conjunctiva/Sclera Subconjunctival hemorrhage - resolved, sutures dissolving White and quiet   Cornea Arcus, well healed temporal cataract wounds, 1+ fine PEE arcus, well healed temporal cataract wounds   Anterior Chamber Deep, 1-2+ cell and pigment Deep and quiet   Iris Round and dilated Round and dilated   Lens PC IOL in excellent position, 1+Posterior capsular opacification PC IOL in good position, trace Posterior capsular opacification   Anterior Vitreous Post vitrectomy, 30-35% bubble with nasal satelitte Vitreous syneresis, no pigment, Posterior vitreous detachment, Weiss ring, prominent linear vitreous condensation in IN quad         Fundus Exam       Right Left   Disc Pink and Sharp, Compact Pink and Sharp, Compact, mild tilt   C/D Ratio 0.2 0.2   Macula Flat, Blunted foveal reflex, trace ERM Flat, Blunted foveal reflex, mild RPE mottling, No heme or edema   Vessels mild attenuation, mild tortuousity mild attenuation, mild  tortuousity   Periphery inferior retina attached, superior retina attached under bubble; 360 peripheral laser changes. ORIGINALLY: Bullous nasal RD from 3149-7026 with small HST at 0130; good cryo changes around 0130 tear Attached, mild pigmented paving stone degeneration IT quad, No heme , No RT/RD           IMAGING AND PROCEDURES  Imaging and Procedures for 07/08/2021  OCT, Retina - OU - Both Eyes       Right Eye Quality was good. Central Foveal Thickness: 287. Progression has improved. Findings include normal foveal contour, no IRF, vitreomacular adhesion , no SRF (Inferior retina re-attached, trace ERM).   Left Eye Quality was good. Central Foveal Thickness: 273. Progression has been stable. Findings include normal foveal contour, no IRF, no SRF (Mild vitreous opacities).   Notes *Images captured and stored on drive  Diagnosis / Impression:  OD: NFP, no IRF; Inferior retina re-attached, tr ERM OS: NFP, no IRF/SRF  Clinical management:  See below  Abbreviations: NFP - Normal foveal profile. CME - cystoid macular edema. PED - pigment epithelial detachment. IRF - intraretinal fluid. SRF - subretinal fluid. EZ - ellipsoid zone. ERM - epiretinal membrane. ORA - outer retinal atrophy. ORT - outer retinal tubulation. SRHM - subretinal hyper-reflective material. IRHM - intraretinal hyper-reflective material            ASSESSMENT/PLAN:   ICD-10-CM   1. Right retinal detachment  H33.21     2. Retinal edema  H35.81 OCT, Retina - OU - Both Eyes    3. Posterior vitreous detachment of left eye  H43.812     4. Pseudophakia, both eyes  Z96.1       1,2. 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, onset 09.25.22 per pt history - detached from 1230 to 0630, fovea on, small tear at 130 periphery - s/p pneumatic retinopexy OD, 09.27.22 - s/p laser retinopexy OD (10.17.22) to supplement cryo - as gas bubble disappeared, inferior SRF remained and  increased             - POW7 s/p PPV/PFO/EL/FAX/14% C3F8 OD, 10.20.2022  - intra-op new small tear identified at 0400 oclock             - doing well   - gas bubble shrinking and inferior retina reattached with good laser changes in place             - IOP 24 ?steroid response             - cont PF decrease to 3 times daily for a week, then decrease to BID until gas bubble is gone         Brimonidine BID OD  Maxitrol prn OD             - avoid laying flat on back             - post op drop and positioning instructions reviewed  - f/u 3 weeks, DFE, OCT  3. PVD OS  - acute development of prominent floaters OS -- saw Dr. Katy Fitch on call on Saturday, 10.16.21  - Discussed findings and prognosis   - No RT or RD on 360 scleral depressed exam  - Reviewed s/s of RT/RD  - Strict return precautions for any such RT/RD signs/symptoms   - f/u in 3-4 wks -- DFE/OCT  4. Pseudophakia OU  - s/p CE/IOL OU (Dr. Valetta Close)  - IOLs in good position, doing well  - monitor   Ophthalmic Meds Ordered this visit:  No orders of the defined types were placed in this encounter.    Return in about 3 weeks (around 07/29/2021) for f/u RD OD, DFE, OCT.  There are no Patient Instructions on file for this visit.  This document serves as a record of services personally performed by Gardiner Sleeper, MD, PhD. It was created on their  behalf by Orvan Falconer, an ophthalmic technician. The creation of this record is the provider's dictation and/or activities during the visit.    Electronically signed by: Orvan Falconer, OA, 07/08/21  11:27 PM  Gardiner Sleeper, M.D., Ph.D. Diseases & Surgery of the Retina and Vitreous [facility]  I have reviewed the above documentation for accuracy and completeness, and I agree with the above. Gardiner Sleeper, M.D., Ph.D. 07/08/21 11:27 PM  Abbreviations: M myopia (nearsighted); A astigmatism; H hyperopia (farsighted); P presbyopia; Mrx spectacle  prescription;  CTL contact lenses; OD right eye; OS left eye; OU both eyes  XT exotropia; ET esotropia; PEK punctate epithelial keratitis; PEE punctate epithelial erosions; DES dry eye syndrome; MGD meibomian gland dysfunction; ATs artificial tears; PFAT's preservative free artificial tears; [place] nuclear sclerotic cataract; PSC posterior subcapsular cataract; ERM epi-retinal membrane; PVD posterior vitreous detachment; RD retinal detachment; DM diabetes mellitus; DR diabetic retinopathy; NPDR non-proliferative diabetic retinopathy; PDR proliferative diabetic retinopathy; CSME clinically significant macular edema; DME diabetic macular edema; dbh dot blot hemorrhages; CWS cotton wool spot; POAG primary open angle glaucoma; C/D cup-to-disc ratio; HVF humphrey visual field; GVF goldmann visual field; OCT optical coherence tomography; IOP intraocular pressure; BRVO Branch retinal vein occlusion; CRVO central retinal vein occlusion; CRAO central retinal artery occlusion; BRAO branch retinal artery occlusion; RT retinal tear; SB scleral buckle; PPV pars plana vitrectomy; VH Vitreous hemorrhage; PRP panretinal laser photocoagulation; IVK intravitreal kenalog; VMT vitreomacular traction; MH Macular hole;  NVD neovascularization of the disc; NVE neovascularization elsewhere; AREDS age related eye disease study; ARMD age related macular degeneration; POAG primary open angle glaucoma; EBMD epithelial/anterior basement membrane dystrophy; ACIOL anterior chamber intraocular lens; IOL intraocular lens; PCIOL posterior chamber intraocular lens; Phaco/IOL phacoemulsification with intraocular lens placement; [place] photorefractive keratectomy; LASIK laser assisted in situ keratomileusis; HTN hypertension; DM diabetes mellitus; COPD chronic obstructive pulmonary disease

## 2021-07-08 ENCOUNTER — Other Ambulatory Visit: Payer: Self-pay

## 2021-07-08 ENCOUNTER — Encounter (INDEPENDENT_AMBULATORY_CARE_PROVIDER_SITE_OTHER): Payer: Self-pay | Admitting: Ophthalmology

## 2021-07-08 ENCOUNTER — Ambulatory Visit (INDEPENDENT_AMBULATORY_CARE_PROVIDER_SITE_OTHER): Payer: Medicare Other | Admitting: Ophthalmology

## 2021-07-08 DIAGNOSIS — H3321 Serous retinal detachment, right eye: Secondary | ICD-10-CM

## 2021-07-08 DIAGNOSIS — H43812 Vitreous degeneration, left eye: Secondary | ICD-10-CM

## 2021-07-08 DIAGNOSIS — H3581 Retinal edema: Secondary | ICD-10-CM

## 2021-07-08 DIAGNOSIS — Z961 Presence of intraocular lens: Secondary | ICD-10-CM

## 2021-07-14 ENCOUNTER — Other Ambulatory Visit: Payer: Self-pay | Admitting: *Deleted

## 2021-07-15 ENCOUNTER — Other Ambulatory Visit: Payer: Medicare Other

## 2021-07-21 ENCOUNTER — Other Ambulatory Visit: Payer: Medicare Other

## 2021-07-29 NOTE — Progress Notes (Shared)
[facility] Note  07/30/2021     CHIEF COMPLAINT Patient presents for No chief complaint on file.   HISTORY OF PRESENT ILLNESS: Douglas Cox is a 70 y.o. male who presents to the clinic today for:     Pt states gas bubble is still shrinking and vision is improving  Referring physician: No referring provider defined for this encounter.  HISTORICAL INFORMATION:  Selected notes from the [facility] for eval of increased floaters/veil OS   CURRENT MEDICATIONS: Current Outpatient Medications (Ophthalmic Drugs)  Medication Sig   prednisoLONE acetate (PRED FORTE) 1 % ophthalmic suspension Place 1 drop into the left eye 4 (four) times daily.   No current facility-administered medications for this visit. (Ophthalmic Drugs)   Current Outpatient Medications (Other)  Medication Sig   acidophilus (RISAQUAD) CAPS capsule Take 1 capsule by mouth daily. Align once daily   cholecalciferol (VITAMIN D3) 25 MCG (1000 UNIT) tablet Take 1,000 Units by mouth daily.   Cyanocobalamin 1000 MCG/ML KIT Inject 1 mL as directed. Every 2 weeks   psyllium (METAMUCIL) 58.6 % powder Take 1 packet by mouth daily.   No current facility-administered medications for this visit. (Other)   REVIEW OF SYSTEMS:   ALLERGIES Allergies  Allergen Reactions   Codone [Hydrocodone] Hives   Nitrous Oxide     Can cause blindness in right eye    Tylenol [Acetaminophen] Hives   Clarithromycin Anxiety   PAST MEDICAL HISTORY Past Medical History:  Diagnosis Date   Celiac disease    Retinal detachment    Past Surgical History:  Procedure Laterality Date   CATARACT EXTRACTION Bilateral    Dr. Valetta Close   EYE SURGERY     FRACTURE SURGERY     GAS INSERTION Right 05/21/2021   Procedure: INSERTION OF GAS - C3F8;  Surgeon: Bernarda Caffey, MD;  Location: [facility];  Service: Ophthalmology;  Laterality: Right;   GAS/FLUID EXCHANGE Right 05/21/2021   Procedure:  GAS/FLUID EXCHANGE;  Surgeon: Bernarda Caffey, MD;  Location: [place];  Service: Ophthalmology;  Laterality: Right;   HERNIA REPAIR     PARS PLANA VITRECTOMY Right 05/21/2021   Procedure: PARS PLANA VITRECTOMY WITH 25 GAUGE;  Surgeon: Bernarda Caffey, MD;  Location: [facility];  Service: Ophthalmology;  Laterality: Right;   PERFLUORONE INJECTION Right 05/21/2021   Procedure: PERFLUORONE INJECTION;  Surgeon: Bernarda Caffey, MD;  Location: [facility];  Service: Ophthalmology;  Laterality: Right;   PHOTOCOAGULATION WITH LASER Right 05/21/2021   Procedure: PHOTOCOAGULATION WITH LASER;  Surgeon: Bernarda Caffey, MD;  Location: [facility];  Service: Ophthalmology;  Laterality: Right;   RETINAL DETACHMENT SURGERY     FAMILY HISTORY No family history on file.  SOCIAL HISTORY Social History   Tobacco Use   Smoking status: Former    Types: Cigarettes    Quit date: 01/30/2006    Years since quitting: 15.5   Smokeless tobacco: Never  Vaping Use   Vaping Use: Never used  Substance Use Topics   Alcohol use: Yes    Alcohol/week: 4.0 - 5.0 standard drinks    Types: 4 - 5 Shots of liquor per week    Comment: 3 glasses of whiskey per night.   Drug use: Yes    Frequency: 7.0 times per week    Types: Marijuana       OPHTHALMIC EXAM: Not recorded    IMAGING AND PROCEDURES  Imaging and Procedures for 07/30/2021  ASSESSMENT/PLAN:   ICD-10-CM   1. Right retinal detachment  H33.21     2. Posterior vitreous detachment of left eye  H43.812     3. Pseudophakia, both eyes  Z96.1       1,2. 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, onset 09.25.22 per pt history - detached from 1230 to 0630, fovea on, small tear at 130 periphery - s/p pneumatic retinopexy OD, 09.27.22 - s/p laser retinopexy OD (10.17.22) to supplement cryo - as gas bubble disappeared, inferior SRF remained and increased             - POW7 s/p PPV/PFO/EL/FAX/14% C3F8 OD, 10.20.2022  - intra-op new small  tear identified at 0400 oclock             - doing well   - gas bubble shrinking and inferior retina reattached with good laser changes in place             - IOP 24 ?steroid response             - cont PF decrease to 3 times daily for a week, then decrease to BID until gas bubble is gone         Brimonidine BID OD  Maxitrol prn OD             - avoid laying flat on back             - post op drop and positioning instructions reviewed  - f/u 3 weeks, DFE, OCT  3. PVD OS  - acute development of prominent floaters OS -- saw Dr. Katy Fitch on call on Saturday, 10.16.21  - Discussed findings and prognosis   - No RT or RD on 360 scleral depressed exam  - Reviewed s/s of RT/RD  - Strict return precautions for any such RT/RD signs/symptoms   - f/u in 3-4 wks -- DFE/OCT  4. Pseudophakia OU  - s/p CE/IOL OU (Dr. Valetta Close)  - IOLs in good position, doing well  - monitor   Ophthalmic Meds Ordered this visit:  No orders of the defined types were placed in this encounter.     No follow-ups on file.  There are no Patient Instructions on file for this visit.  This document serves as a record of services personally performed by Gardiner Sleeper, MD, PhD. It was created on their behalf by San Jetty. Owens Shark, OA an ophthalmic technician. The creation of this record is the provider's dictation and/or activities during the visit.    Electronically signed by: San Jetty. Marguerita Merles 12.28.2022 12:29 PM   Gardiner Sleeper, M.D., Ph.D. Diseases & Surgery of the Retina and Vitreous [facility]: M myopia (nearsighted); A astigmatism; H hyperopia (farsighted); P presbyopia; Mrx spectacle prescription;  CTL contact lenses; OD right eye; OS left eye; OU both eyes  XT exotropia; ET esotropia; PEK punctate epithelial keratitis; PEE punctate epithelial erosions; DES dry eye syndrome; MGD meibomian gland dysfunction; ATs artificial tears; PFAT's preservative free artificial tears;  [place] nuclear sclerotic cataract; PSC posterior subcapsular cataract; ERM epi-retinal membrane; PVD posterior vitreous detachment; RD retinal detachment; DM diabetes mellitus; DR diabetic retinopathy; NPDR non-proliferative diabetic retinopathy; PDR proliferative diabetic retinopathy; CSME clinically significant macular edema; DME diabetic macular edema; dbh dot blot hemorrhages; CWS cotton wool spot; POAG primary open angle glaucoma; C/D cup-to-disc ratio; HVF humphrey visual field; GVF goldmann visual field; OCT optical coherence tomography; IOP intraocular pressure;  BRVO Branch retinal vein occlusion; CRVO central retinal vein occlusion; CRAO central retinal artery occlusion; BRAO branch retinal artery occlusion; RT retinal tear; SB scleral buckle; PPV pars plana vitrectomy; VH Vitreous hemorrhage; PRP panretinal laser photocoagulation; IVK intravitreal kenalog; VMT vitreomacular traction; MH Macular hole;  NVD neovascularization of the disc; NVE neovascularization elsewhere; AREDS age related eye disease study; ARMD age related macular degeneration; POAG primary open angle glaucoma; EBMD epithelial/anterior basement membrane dystrophy; ACIOL anterior chamber intraocular lens; IOL intraocular lens; PCIOL posterior chamber intraocular lens; Phaco/IOL phacoemulsification with intraocular lens placement; [place] photorefractive keratectomy; LASIK laser assisted in situ keratomileusis; HTN hypertension; DM diabetes mellitus; COPD chronic obstructive pulmonary disease

## 2021-07-30 ENCOUNTER — Encounter (INDEPENDENT_AMBULATORY_CARE_PROVIDER_SITE_OTHER): Payer: Medicare Other | Admitting: Ophthalmology

## 2021-07-31 ENCOUNTER — Telehealth: Payer: Medicare Other | Admitting: Thoracic Surgery (Cardiothoracic Vascular Surgery)

## 2021-07-31 ENCOUNTER — Other Ambulatory Visit: Payer: Self-pay

## 2021-08-03 NOTE — Progress Notes (Signed)
[facility] Note  08/04/2021     CHIEF COMPLAINT Patient presents for Retina Follow Up  HISTORY OF PRESENT ILLNESS: Douglas Cox is a 71 y.o. male who presents to the clinic today for:   HPI     Retina Follow Up   Patient presents with  Retinal Break/Detachment.  In right eye.  Severity is moderate.  Duration of 4 weeks.  Since onset it is stable.  I, the attending physician,  performed the HPI with the patient and updated documentation appropriately.        Comments   Pt here for 4 wk ret f/u for RD OD. Pt states vision is the same, no longer taking any gtts. Not seeing gas bubble any longer.       Last edited by Bernarda Caffey, MD on 08/04/2021  9:26 PM.    Pt states gas bubble disappeared on Sunday, he is no longer using any drops  Referring physician: No referring provider defined for this encounter.  HISTORICAL INFORMATION:  Selected notes from the [facility] for eval of increased floaters/veil OS   CURRENT MEDICATIONS: Current Outpatient Medications (Ophthalmic Drugs)  Medication Sig   prednisoLONE acetate (PRED FORTE) 1 % ophthalmic suspension Place 1 drop into the left eye 4 (four) times daily. (Patient not taking: Reported on 08/04/2021)   No current facility-administered medications for this visit. (Ophthalmic Drugs)   Current Outpatient Medications (Other)  Medication Sig   acidophilus (RISAQUAD) CAPS capsule Take 1 capsule by mouth daily. Align once daily   cholecalciferol (VITAMIN D3) 25 MCG (1000 UNIT) tablet Take 1,000 Units by mouth daily.   Cyanocobalamin 1000 MCG/ML KIT Inject 1 mL as directed. Every 2 weeks   psyllium (METAMUCIL) 58.6 % powder Take 1 packet by mouth daily.   No current facility-administered medications for this visit. (Other)   REVIEW OF SYSTEMS: ROS   Positive for: Cardiovascular, Eyes Negative for: Constitutional, Gastrointestinal, Neurological, Skin, Genitourinary,  Musculoskeletal, HENT, Endocrine, Respiratory, Psychiatric, Allergic/Imm, Heme/Lymph Last edited by Kingsley Spittle, COT on 08/04/2021  2:31 PM.      ALLERGIES Allergies  Allergen Reactions   Codone [Hydrocodone] Hives   Nitrous Oxide     Can cause blindness in right eye    Tylenol [Acetaminophen] Hives   Clarithromycin Anxiety   PAST MEDICAL HISTORY Past Medical History:  Diagnosis Date   Celiac disease    Retinal detachment    Past Surgical History:  Procedure Laterality Date   CATARACT EXTRACTION Bilateral    Dr. Valetta Close   EYE SURGERY     FRACTURE SURGERY     GAS INSERTION Right 05/21/2021   Procedure: INSERTION OF GAS - C3F8;  Surgeon: Bernarda Caffey, MD;  Location: [facility];  Service: Ophthalmology;  Laterality: Right;   GAS/FLUID EXCHANGE Right 05/21/2021   Procedure: GAS/FLUID EXCHANGE;  Surgeon: Bernarda Caffey, MD;  Location: [facility];  Service: Ophthalmology;  Laterality: Right;   HERNIA REPAIR     PARS PLANA VITRECTOMY Right 05/21/2021   Procedure: PARS PLANA VITRECTOMY WITH 25 GAUGE;  Surgeon: Bernarda Caffey, MD;  Location: [facility];  Service: Ophthalmology;  Laterality: Right;   PERFLUORONE INJECTION Right 05/21/2021   Procedure: PERFLUORONE INJECTION;  Surgeon: Bernarda Caffey, MD;  Location: [facility];  Service: Ophthalmology;  Laterality: Right;   PHOTOCOAGULATION WITH LASER Right 05/21/2021   Procedure: PHOTOCOAGULATION WITH LASER;  Surgeon: Bernarda Caffey, MD;  Location: [facility];  Service: Ophthalmology;  Laterality: Right;   RETINAL  DETACHMENT SURGERY     FAMILY HISTORY History reviewed. No pertinent family history.  SOCIAL HISTORY Social History   Tobacco Use   Smoking status: Former    Types: Cigarettes    Quit date: 01/30/2006    Years since quitting: 15.5   Smokeless tobacco: Never  Vaping Use   Vaping Use: Never used  Substance Use Topics   Alcohol use: Yes    Alcohol/week: 4.0 - 5.0 standard drinks    Types: 4 - 5 Shots of liquor per week    Comment: 3  glasses of whiskey per night.   Drug use: Yes    Frequency: 7.0 times per week    Types: Marijuana       OPHTHALMIC EXAM: Base Eye Exam     Visual Acuity (Snellen - Linear)       Right Left   Dist [place] 20/40 +1 20/20   Dist ph [place] 20/25          Tonometry (Tonopen, 2:35 PM)       Right Left   Pressure 13 10         Pupils       Dark Light Shape React APD   Right 5 4 Round Brisk None   Left 4 3 Round Brisk None         Visual Fields (Counting fingers)       Left Right    Full Full         Extraocular Movement       Right Left    Full, Ortho Full, Ortho         Neuro/Psych     Oriented x3: Yes   Mood/Affect: Normal         Dilation     Both eyes: 1.0% Mydriacyl, 2.5% Phenylephrine @ 2:36 PM           Slit Lamp and Fundus Exam     Slit Lamp Exam       Right Left   Lids/Lashes Dermatochalasis - upper lid, mild periorbital edema Dermatochalasis - upper lid, mild Meibomian gland dysfunction   Conjunctiva/Sclera Subconjunctival hemorrhage - resolved, sutures dissolving White and quiet   Cornea Arcus, well healed temporal cataract wounds, trace PEE arcus, well healed temporal cataract wounds   Anterior Chamber Deep, 1-2+ cell and pigment Deep and quiet   Iris Round and dilated Round and dilated   Lens PC IOL in excellent position, 1+Posterior capsular opacification PC IOL in good position, trace Posterior capsular opacification   Anterior Vitreous Post vitrectomy, gas bubble gone Vitreous syneresis, no pigment, Posterior vitreous detachment, Weiss ring, prominent linear vitreous condensation in IN quad         Fundus Exam       Right Left   Disc Pink and Sharp, Compact Pink and Sharp, Compact, mild tilt   C/D Ratio 0.2 0.2   Macula Flat, Blunted foveal reflex, trace ERM Flat, Blunted foveal reflex, mild RPE mottling, No heme or edema   Vessels attenuated, mild tortuousity attenuated, Tortuous   Periphery inferior retina attached,  superior retina attached under bubble; 360 peripheral laser changes. ORIGINALLY: Bullous nasal RD from 1884-1660 with small HST at 0130; good cryo changes around 0130 tear Attached, mild pigmented paving stone degeneration IT quad, No heme, No RT/RD           IMAGING AND PROCEDURES  Imaging and Procedures for 08/04/2021  OCT, Retina - OU - Both Eyes  Right Eye Quality was good. Central Foveal Thickness: 275. Progression has improved. Findings include normal foveal contour, no IRF, vitreomacular adhesion , no SRF (Inferior retina re-attached, trace ERM).   Left Eye Quality was good. Central Foveal Thickness: 267. Progression has been stable. Findings include normal foveal contour, no IRF, no SRF.   Notes *Images captured and stored on drive  Diagnosis / Impression:  OD: NFP, no IRF; Inferior retina re-attached, tr ERM OS: NFP, no IRF/SRF  Clinical management:  See below  Abbreviations: NFP - Normal foveal profile. CME - cystoid macular edema. PED - pigment epithelial detachment. IRF - intraretinal fluid. SRF - subretinal fluid. EZ - ellipsoid zone. ERM - epiretinal membrane. ORA - outer retinal atrophy. ORT - outer retinal tubulation. SRHM - subretinal hyper-reflective material. IRHM - intraretinal hyper-reflective material            ASSESSMENT/PLAN:   ICD-10-CM   1. Right retinal detachment  H33.21 OCT, Retina - OU - Both Eyes    2. Posterior vitreous detachment of left eye  H43.812     3. Pseudophakia, both eyes  Z96.1      1,2. 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, onset 09.25.22 per pt history - detached from 1230 to 0630, fovea on, small tear at 130 periphery - s/p pneumatic retinopexy OD, 09.27.22 - s/p laser retinopexy OD (10.17.22) to supplement cryo - as gas bubble disappeared, inferior SRF remained and increased             - POW11 s/p PPV/PFO/EL/FAX/14% C3F8 OD, 10.20.2022  - intra-op new small tear identified at  0400 oclock             - doing well   - gas bubble gone and inferior retina reattached with good laser changes in place 360             - IOP improved to 13 from 24             - pt stopped all drops on Sunday -- okay to stay off - f/u 10 weeks, DFE, OCT  3. PVD OS  - acute development of prominent floaters OS -- saw Dr. Katy Fitch on call on Saturday, 10.16.21  - Discussed findings and prognosis   - No RT or RD on 360 scleral depressed exam  - Reviewed s/s of RT/RD  - Strict return precautions for any such RT/RD signs/symptoms   - f/u in 10 wks -- DFE/OCT  4. Pseudophakia OU  - s/p CE/IOL OU (Dr. Valetta Close)  - IOLs in good position, doing well  - monitor   Ophthalmic Meds Ordered this visit:  No orders of the defined types were placed in this encounter.    Return in about 10 weeks (around 10/13/2021) for f/u RD OD, DFE, OCT.  There are no Patient Instructions on file for this visit.  This document serves as a record of services personally performed by Gardiner Sleeper, MD, PhD. It was created on their behalf by Estill Bakes, COT an ophthalmic technician. The creation of this record is the provider's dictation and/or activities during the visit.    Electronically signed by: Estill Bakes, COT 1.2.23 @ 9:29 PM   This document serves as a record of services personally performed by Gardiner Sleeper, MD, PhD. It was created on their behalf by San Jetty. Owens Shark, OA an ophthalmic technician. The creation of this record is the provider's dictation and/or activities during the visit.    Electronically  signed by: San Jetty. Marguerita Merles 01.03.2022 9:29 PM   Gardiner Sleeper, M.D., Ph.D. Diseases & Surgery of the Retina and Vitreous [facility]  I have reviewed the above documentation for accuracy and completeness, and I agree with the above. Gardiner Sleeper, M.D., Ph.D. 08/04/21 9:31 PM   Abbreviations: M myopia (nearsighted); A astigmatism; H hyperopia (farsighted); P  presbyopia; Mrx spectacle prescription;  CTL contact lenses; OD right eye; OS left eye; OU both eyes  XT exotropia; ET esotropia; PEK punctate epithelial keratitis; PEE punctate epithelial erosions; DES dry eye syndrome; MGD meibomian gland dysfunction; ATs artificial tears; PFAT's preservative free artificial tears; [place] nuclear sclerotic cataract; PSC posterior subcapsular cataract; ERM epi-retinal membrane; PVD posterior vitreous detachment; RD retinal detachment; DM diabetes mellitus; DR diabetic retinopathy; NPDR non-proliferative diabetic retinopathy; PDR proliferative diabetic retinopathy; CSME clinically significant macular edema; DME diabetic macular edema; dbh dot blot hemorrhages; CWS cotton wool spot; POAG primary open angle glaucoma; C/D cup-to-disc ratio; HVF humphrey visual field; GVF goldmann visual field; OCT optical coherence tomography; IOP intraocular pressure; BRVO Branch retinal vein occlusion; CRVO central retinal vein occlusion; CRAO central retinal artery occlusion; BRAO branch retinal artery occlusion; RT retinal tear; SB scleral buckle; PPV pars plana vitrectomy; VH Vitreous hemorrhage; PRP panretinal laser photocoagulation; IVK intravitreal kenalog; VMT vitreomacular traction; MH Macular hole;  NVD neovascularization of the disc; NVE neovascularization elsewhere; AREDS age related eye disease study; ARMD age related macular degeneration; POAG primary open angle glaucoma; EBMD epithelial/anterior basement membrane dystrophy; ACIOL anterior chamber intraocular lens; IOL intraocular lens; PCIOL posterior chamber intraocular lens; Phaco/IOL phacoemulsification with intraocular lens placement; [place] photorefractive keratectomy; LASIK laser assisted in situ keratomileusis; HTN hypertension; DM diabetes mellitus; COPD chronic obstructive pulmonary disease

## 2021-08-04 ENCOUNTER — Encounter (INDEPENDENT_AMBULATORY_CARE_PROVIDER_SITE_OTHER): Payer: Self-pay | Admitting: Ophthalmology

## 2021-08-04 ENCOUNTER — Other Ambulatory Visit: Payer: Self-pay

## 2021-08-04 ENCOUNTER — Ambulatory Visit (INDEPENDENT_AMBULATORY_CARE_PROVIDER_SITE_OTHER): Payer: Medicare Other | Admitting: Ophthalmology

## 2021-08-04 DIAGNOSIS — Z961 Presence of intraocular lens: Secondary | ICD-10-CM

## 2021-08-04 DIAGNOSIS — H43812 Vitreous degeneration, left eye: Secondary | ICD-10-CM

## 2021-08-04 DIAGNOSIS — H3321 Serous retinal detachment, right eye: Secondary | ICD-10-CM | POA: Diagnosis not present

## 2021-08-10 ENCOUNTER — Other Ambulatory Visit: Payer: Self-pay

## 2021-08-10 ENCOUNTER — Ambulatory Visit
Admission: RE | Admit: 2021-08-10 | Discharge: 2021-08-10 | Disposition: A | Discharge status: Home / Self Care | Payer: Medicare Other | Source: Ambulatory Visit | Attending: Thoracic Surgery (Cardiothoracic Vascular Surgery) | Admitting: Thoracic Surgery (Cardiothoracic Vascular Surgery)

## 2021-08-10 ENCOUNTER — Other Ambulatory Visit: Payer: Medicare Other

## 2021-08-10 DIAGNOSIS — J9859 Other diseases of mediastinum, not elsewhere classified: Secondary | ICD-10-CM

## 2021-08-10 MED ORDER — GADOBENATE DIMEGLUMINE 529 MG/ML IV SOLN
14.0000 mL | Freq: Once | INTRAVENOUS | Status: AC | PRN
  Administered 2021-08-10: 14 mL via INTRAVENOUS

## 2021-08-10 NOTE — Telephone Encounter (Signed)
Patient is having an MRI today ordered by Dr Kipp Brood.

## 2021-08-11 NOTE — Telephone Encounter (Signed)
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  There is no mention of esophageal thickening.  Please schedule follow-up office visit with me so I can discuss how he would prefer to proceed.  He had deferred EGD in the past.  Thank you

## 2021-08-11 NOTE — Telephone Encounter (Signed)
Spoke with the patient. He thanks me for the follow up.  He declines to schedule at this time. He states he will follow up with Dr Kipp Brood first. 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 He has my contact information.

## 2021-08-11 NOTE — Telephone Encounter (Signed)
Please review this patient's chart. Is he still in need of an EGD?

## 2021-08-21 ENCOUNTER — Other Ambulatory Visit: Payer: Self-pay

## 2021-08-21 ENCOUNTER — Ambulatory Visit (INDEPENDENT_AMBULATORY_CARE_PROVIDER_SITE_OTHER): Payer: Medicare Other | Admitting: Thoracic Surgery (Cardiothoracic Vascular Surgery)

## 2021-08-21 DIAGNOSIS — E328 Other diseases of thymus: Secondary | ICD-10-CM | POA: Diagnosis not present

## 2021-08-21 NOTE — Progress Notes (Signed)
°   °  [address]       [place] [address]             [phone]       Patient: Home Provider: Office Consent for Telemedicine visit obtained.  Todays visit was completed via a real-time telehealth (see specific modality noted below). The patient/authorized person provided oral consent at the time of the visit to engage in a telemedicine encounter with the present provider at [facility]. The patient/authorized person was informed of the potential benefits, limitations, and risks of telemedicine. The patient/authorized person expressed understanding that the laws that protect confidentiality also apply to telemedicine. The patient/authorized person acknowledged understanding that telemedicine does not provide emergency services and that he or she would need to call 911 or proceed to the nearest hospital for help if such a need arose.   Total time spent in the clinical discussion 10 minutes.  Telehealth Modality: Phone visit (audio only)  I had a telephone visit with Mr. Trombetta.  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  He had extensive work-up which included a PET/CT as well as tumor markers which all led to a benign findings consistent with a cyst.  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  We discussed the results of the MRI today, and I informed him that we will get a another CT scan in a year to confirm stability.  In regards to the esophageal wall thickening he has deferred an EGD due to medical bills that he is experienced in the past year.  He did state that he will follow-up with gastroenterology.

## 2021-10-06 NOTE — Progress Notes (Shared)
[facility] Note  10/13/2021     CHIEF COMPLAINT Patient presents for No chief complaint on file.  HISTORY OF PRESENT ILLNESS: Douglas Cox is a 71 y.o. male who presents to the clinic today for:    Pt states gas bubble disappeared on Sunday, he is no longer using any drops  Referring physician: No referring provider defined for this encounter.  HISTORICAL INFORMATION:  Selected notes from the [facility] for eval of increased floaters/veil OS   CURRENT MEDICATIONS: Current Outpatient Medications (Ophthalmic Drugs)  Medication Sig   prednisoLONE acetate (PRED FORTE) 1 % ophthalmic suspension Place 1 drop into the left eye 4 (four) times daily. (Patient not taking: Reported on 08/04/2021)   No current facility-administered medications for this visit. (Ophthalmic Drugs)   Current Outpatient Medications (Other)  Medication Sig   acidophilus (RISAQUAD) CAPS capsule Take 1 capsule by mouth daily. Align once daily   cholecalciferol (VITAMIN D3) 25 MCG (1000 UNIT) tablet Take 1,000 Units by mouth daily.   Cyanocobalamin 1000 MCG/ML KIT Inject 1 mL as directed. Every 2 weeks   psyllium (METAMUCIL) 58.6 % powder Take 1 packet by mouth daily.   No current facility-administered medications for this visit. (Other)   REVIEW OF SYSTEMS:    ALLERGIES Allergies  Allergen Reactions   Codone [Hydrocodone] Hives   Nitrous Oxide     Can cause blindness in right eye    Tylenol [Acetaminophen] Hives   Clarithromycin Anxiety   PAST MEDICAL HISTORY Past Medical History:  Diagnosis Date   Celiac disease    Retinal detachment    Past Surgical History:  Procedure Laterality Date   CATARACT EXTRACTION Bilateral    Dr. Valetta Close   EYE SURGERY     FRACTURE SURGERY     GAS INSERTION Right 05/21/2021   Procedure: INSERTION OF GAS - C3F8;  Surgeon: Bernarda Caffey, MD;  Location: [facility];  Service: Ophthalmology;  Laterality: Right;    GAS/FLUID EXCHANGE Right 05/21/2021   Procedure: GAS/FLUID EXCHANGE;  Surgeon: Bernarda Caffey, MD;  Location: [facility];  Service: Ophthalmology;  Laterality: Right;   HERNIA REPAIR     PARS PLANA VITRECTOMY Right 05/21/2021   Procedure: PARS PLANA VITRECTOMY WITH 25 GAUGE;  Surgeon: Bernarda Caffey, MD;  Location: [facility];  Service: Ophthalmology;  Laterality: Right;   PERFLUORONE INJECTION Right 05/21/2021   Procedure: PERFLUORONE INJECTION;  Surgeon: Bernarda Caffey, MD;  Location: [facility];  Service: Ophthalmology;  Laterality: Right;   PHOTOCOAGULATION WITH LASER Right 05/21/2021   Procedure: PHOTOCOAGULATION WITH LASER;  Surgeon: Bernarda Caffey, MD;  Location: [facility];  Service: Ophthalmology;  Laterality: Right;   RETINAL DETACHMENT SURGERY     FAMILY HISTORY No family history on file.  SOCIAL HISTORY Social History   Tobacco Use   Smoking status: Former    Types: Cigarettes    Quit date: 01/30/2006    Years since quitting: 15.6   Smokeless tobacco: Never  Vaping Use   Vaping Use: Never used  Substance Use Topics   Alcohol use: Yes    Alcohol/week: 4.0 - 5.0 standard drinks    Types: 4 - 5 Shots of liquor per week    Comment: 3 glasses of whiskey per night.   Drug use: Yes    Frequency: 7.0 times per week    Types: Marijuana       OPHTHALMIC EXAM: Not recorded    IMAGING AND PROCEDURES  Imaging and Procedures for 10/13/2021  ASSESSMENT/PLAN:   ICD-10-CM   1. Right retinal detachment  H33.21     2. Posterior vitreous detachment of left eye  H43.812     3. Pseudophakia, both eyes  Z96.1      1,2. 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, onset 09.25.22 per pt history - detached from 1230 to 0630, fovea on, small tear at 130 periphery - s/p pneumatic retinopexy OD, 09.27.22 - s/p laser retinopexy OD (10.17.22) to supplement cryo - as gas bubble disappeared, inferior SRF remained and increased             - POW11 s/p  PPV/PFO/EL/FAX/14% C3F8 OD, 10.20.2022  - intra-op new small tear identified at 0400 oclock             - doing well   - gas bubble gone and inferior retina reattached with good laser changes in place 360             - IOP improved to 13 from 24             - pt stopped all drops on Sunday -- okay to stay off - f/u 10 weeks, DFE, OCT  3. PVD OS  - acute development of prominent floaters OS -- saw Dr. Katy Fitch on call on Saturday, 10.16.21  - Discussed findings and prognosis   - No RT or RD on 360 scleral depressed exam  - Reviewed s/s of RT/RD  - Strict return precautions for any such RT/RD signs/symptoms   - f/u in 10 wks -- DFE/OCT  4. Pseudophakia OU  - s/p CE/IOL OU (Dr. Valetta Close)  - IOLs in good position, doing well  - monitor   Ophthalmic Meds Ordered this visit:  No orders of the defined types were placed in this encounter.    No follow-ups on file.  There are no Patient Instructions on file for this visit.  This document serves as a record of services personally performed by Gardiner Sleeper, MD, PhD. It was created on their behalf by San Jetty. Owens Shark, OA an ophthalmic technician. The creation of this record is the provider's dictation and/or activities during the visit.    Electronically signed by: San Jetty. Owens Shark, [place] 03.07.2023 10:28 AM    Gardiner Sleeper, M.D., Ph.D. Diseases & Surgery of the Retina and Vitreous [facility]: M myopia (nearsighted); A astigmatism; H hyperopia (farsighted); P presbyopia; Mrx spectacle prescription;  CTL contact lenses; OD right eye; OS left eye; OU both eyes  XT exotropia; ET esotropia; PEK punctate epithelial keratitis; PEE punctate epithelial erosions; DES dry eye syndrome; MGD meibomian gland dysfunction; ATs artificial tears; PFAT's preservative free artificial tears; [place] nuclear sclerotic cataract; PSC posterior subcapsular cataract; ERM epi-retinal membrane; PVD posterior vitreous detachment;  RD retinal detachment; DM diabetes mellitus; DR diabetic retinopathy; NPDR non-proliferative diabetic retinopathy; PDR proliferative diabetic retinopathy; CSME clinically significant macular edema; DME diabetic macular edema; dbh dot blot hemorrhages; CWS cotton wool spot; POAG primary open angle glaucoma; C/D cup-to-disc ratio; HVF humphrey visual field; GVF goldmann visual field; OCT optical coherence tomography; IOP intraocular pressure; BRVO Branch retinal vein occlusion; CRVO central retinal vein occlusion; CRAO central retinal artery occlusion; BRAO branch retinal artery occlusion; RT retinal tear; SB scleral buckle; PPV pars plana vitrectomy; VH Vitreous hemorrhage; PRP panretinal laser photocoagulation; IVK intravitreal kenalog; VMT vitreomacular traction; MH Macular hole;  NVD neovascularization of the disc; NVE neovascularization elsewhere; AREDS age related eye  disease study; ARMD age related macular degeneration; POAG primary open angle glaucoma; EBMD epithelial/anterior basement membrane dystrophy; ACIOL anterior chamber intraocular lens; IOL intraocular lens; PCIOL posterior chamber intraocular lens; Phaco/IOL phacoemulsification with intraocular lens placement; [place] photorefractive keratectomy; LASIK laser assisted in situ keratomileusis; HTN hypertension; DM diabetes mellitus; COPD chronic obstructive pulmonary disease

## 2021-10-13 ENCOUNTER — Other Ambulatory Visit: Payer: Self-pay

## 2021-10-13 ENCOUNTER — Encounter (INDEPENDENT_AMBULATORY_CARE_PROVIDER_SITE_OTHER): Payer: Self-pay | Admitting: Ophthalmology

## 2021-10-13 ENCOUNTER — Ambulatory Visit (INDEPENDENT_AMBULATORY_CARE_PROVIDER_SITE_OTHER): Payer: Medicare Other | Admitting: Ophthalmology

## 2021-10-13 DIAGNOSIS — Z961 Presence of intraocular lens: Secondary | ICD-10-CM

## 2021-10-13 DIAGNOSIS — H43812 Vitreous degeneration, left eye: Secondary | ICD-10-CM

## 2021-10-13 DIAGNOSIS — H3321 Serous retinal detachment, right eye: Secondary | ICD-10-CM

## 2021-11-13 DIAGNOSIS — H43819 Vitreous degeneration, unspecified eye: Secondary | ICD-10-CM | POA: Insufficient documentation

## 2021-11-13 DIAGNOSIS — L570 Actinic keratosis: Secondary | ICD-10-CM | POA: Insufficient documentation

## 2022-06-28 ENCOUNTER — Encounter: Payer: Self-pay | Admitting: Gastroenterology

## 2022-06-28 ENCOUNTER — Ambulatory Visit: Payer: Medicare Other | Admitting: Gastroenterology

## 2022-06-28 VITALS — BP 126/76 | HR 53 | Ht 70.0 in | Wt 158.0 lb

## 2022-06-28 DIAGNOSIS — K642 Third degree hemorrhoids: Secondary | ICD-10-CM

## 2022-06-28 DIAGNOSIS — K623 Rectal prolapse: Secondary | ICD-10-CM | POA: Diagnosis not present

## 2022-06-28 DIAGNOSIS — K219 Gastro-esophageal reflux disease without esophagitis: Secondary | ICD-10-CM

## 2022-06-28 DIAGNOSIS — K900 Celiac disease: Secondary | ICD-10-CM | POA: Diagnosis not present

## 2022-06-28 DIAGNOSIS — R9389 Abnormal findings on diagnostic imaging of other specified body structures: Secondary | ICD-10-CM | POA: Diagnosis not present

## 2022-06-28 DIAGNOSIS — K649 Unspecified hemorrhoids: Secondary | ICD-10-CM

## 2022-06-28 MED ORDER — HYDROCORTISONE ACETATE 25 MG RE SUPP: 25.0000 mg | Freq: Every day | RECTAL | 1 refills | Status: AC

## 2022-06-28 NOTE — Patient Instructions (Addendum)
We have sent the following medications to your pharmacy for you to pick up at your convenience: Anusol suppositories to do at bedtime x 10 days.   Continue Benefiber.  You have been scheduled for an endoscopy. Please follow written instructions given to you at your visit today. If you use inhalers (even only as needed), please bring them with you on the day of your procedure.  We are referring you to pelvic floor therapy at [facility]. They will contact you with an appointment date and time.   We are also referring your to a colorectal surgeon at [facility]. They will also call you with a date of your consultation. [address], [place], [place] [address] Phone: [phone]  The [facility] providers would like to encourage you to use [facility] to communicate with providers for non-urgent requests or questions.  Due to long hold times on the telephone, sending your provider a message by [facility] may be a faster and more efficient way to get a response.  Please allow 48 business hours for a response.  Please remember that this is for non-urgent requests.   Due to recent changes in healthcare laws, you may see the results of your imaging and laboratory studies on MyChart before your provider has had a chance to review them.  We understand that in some cases there may be results that are confusing or concerning to you. Not all laboratory results come back in the same time frame and the provider may be waiting for multiple results in order to interpret others.  Please give [place] 48 hours in order for your provider to thoroughly review all the results before contacting the office for clarification of your results.

## 2022-06-28 NOTE — Progress Notes (Unsigned)
         Douglas Cox    [id number]    12/25/1950  Primary Care Physician:Pcp, No  Referring Physician: No referring provider defined for this encounter.   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   He had mild diffuse thickening of esophagus on CT angio chest abdomen pelvis in August 2022 but subsequent imaging in September were negative for any mass lesion or esophageal thickening.  No hiatal hernia and ingested barium tablet passed freely through the esophagus. Denies any dysphagia, odynophagia, heartburn, nausea, vomiting, abdominal pain, melena or bright red blood per rectum   He is strictly adhering to gluten-free and lactose-free diet.   Complains of symptoms from hemorrhoids, discomfort and intermittent bright red blood per rectum   Patient moved here 2 to 3 years ago from [place] EGD and colonoscopy in [place] 5-6 years ago were unremarkable according to patient, reports not available to review during this visit  Outpatient Encounter Medications as of 06/28/2022  Medication Sig   acidophilus (RISAQUAD) CAPS capsule Take 1 capsule by mouth daily. Align once daily   cholecalciferol (VITAMIN D3) 25 MCG (1000 UNIT) tablet Take 1,000 Units by mouth daily.   Cyanocobalamin 1000 MCG/ML KIT Inject 1 mL as directed. Every 2 weeks   psyllium (METAMUCIL) 58.6 % powder Take 1 packet by mouth daily.   prednisoLONE acetate (PRED FORTE) 1 % ophthalmic suspension Place 1 drop into the left eye 4 (four) times daily. (Patient not taking: Reported on 08/04/2021)   No facility-administered  encounter medications on file as of 06/28/2022.    Allergies as of 06/28/2022 - Review Complete 06/28/2022  Allergen Reaction Noted   Codone [hydrocodone] Hives 01/30/2018   Nitrous oxide  05/21/2021   Tylenol [acetaminophen] Hives 07/08/2021   Clarithromycin Anxiety 01/30/2018    Past Medical History:  Diagnosis Date   Celiac disease    Retinal detachment     Past Surgical History:  Procedure Laterality Date   CATARACT EXTRACTION Bilateral    Dr. Bowen   EYE SURGERY     FRACTURE SURGERY     GAS INSERTION Right 05/21/2021   Procedure: INSERTION OF GAS - C3F8;  Surgeon: Zamora, Brian, MD;  Location: [facility] OR;  Service: Ophthalmology;  Laterality: Right;   GAS/FLUID EXCHANGE Right 05/21/2021   Procedure: GAS/FLUID EXCHANGE;  Surgeon: Zamora, Brian, MD;  Location: [facility] OR;  Service: Ophthalmology;  Laterality: Right;   HERNIA REPAIR     PARS PLANA VITRECTOMY Right 05/21/2021   Procedure: PARS PLANA VITRECTOMY WITH 25 GAUGE;  Surgeon: Zamora, Brian, MD;  Location: [facility] OR;  Service: Ophthalmology;  Laterality: Right;   PERFLUORONE INJECTION Right 05/21/2021   Procedure: PERFLUORONE INJECTION;  Surgeon: Zamora, Brian, MD;  Location: [facility] OR;  Service: Ophthalmology;  Laterality: Right;   PHOTOCOAGULATION WITH LASER Right 05/21/2021   Procedure: PHOTOCOAGULATION WITH LASER;  Surgeon: Zamora, Brian, MD;  Location: [facility] OR;  Service: Ophthalmology;  Laterality: Right;   RETINAL DETACHMENT SURGERY      History reviewed. No pertinent family history.  Social History   Socioeconomic History   Marital status: Married    Spouse name:   Not on file   Number of children: Not on file   Years of education: Not on file   Highest education level: Not on file  Occupational History   Not on file  Tobacco Use   Smoking status: Former    Types: Cigarettes    Quit date: 01/30/2006    Years since quitting: 16.4   Smokeless tobacco: Never  Vaping Use   Vaping Use: Never used  Substance and Sexual  Activity   Alcohol use: Yes    Alcohol/week: 4.0 - 5.0 standard drinks of alcohol    Types: 4 - 5 Shots of liquor per week    Comment: 3 glasses of whiskey per night.   Drug use: Yes    Frequency: 7.0 times per week    Types: Marijuana   Sexual activity: Not Currently  Other Topics Concern   Not on file  Social History Narrative   Not on file   Social Determinants of Health   Financial Resource Strain: Not on file  Food Insecurity: Not on file  Transportation Needs: Not on file  Physical Activity: Not on file  Stress: Not on file  Social Connections: Not on file  Intimate Partner Violence: Not on file      Review of systems: All other review of systems negative except as mentioned in the HPI.   Physical Exam: Vitals:   06/28/22 1406  BP: 126/76  Pulse: (Abnormal) 53   Body mass index is 22.67 kg/m. Gen:      No acute distress HEENT:  sclera anicteric Abd:      soft, non-tender; no palpable masses, no distension Ext:    No edema Neuro: alert and oriented x 3 Psych: normal mood and affect  Data Reviewed:  Reviewed labs, radiology imaging, old records and pertinent past GI work up   Assessment and Plan/Recommendations:  71-year-old very pleasant gentleman with history of celiac disease,  abnormal CT with mediastinal mass and esophageal wall thickening   Schedule for EGD for further evaluation, exclude any neoplastic lesion or erosive esophagitis.  Will also evaluate with duodenal biopsies to monitor celiac disease   Symptomatic prolapsed hemorrhoids with low anal sphincter tone: Refer to colorectal surgery for management of prolapsed hemorrhoids and pelvic floor physical therapy anal sphincter tone Use Benefiber 1 tablespoon twice daily with meals Use Anusol suppository daily at bedtime for 7 to 10 days  Patient reports that he had normal colonoscopy 5 years ago in [place], will try to obtain the reports  Return in 6 months or sooner if needed  The  patient was provided an opportunity to ask questions and all were answered. The patient agreed with the plan and demonstrated an understanding of the instructions.  K. Veena  , MD    CC: No ref. provider found   

## 2022-07-01 ENCOUNTER — Encounter: Payer: Self-pay | Admitting: Gastroenterology

## 2022-07-14 ENCOUNTER — Other Ambulatory Visit: Payer: Self-pay | Admitting: Thoracic Surgery (Cardiothoracic Vascular Surgery)

## 2022-07-14 DIAGNOSIS — R911 Solitary pulmonary nodule: Secondary | ICD-10-CM

## 2022-07-17 ENCOUNTER — Inpatient Hospital Stay (HOSPITAL_COMMUNITY): Payer: Medicare Other | Admitting: Certified Registered Nurse Anesthetist

## 2022-07-17 ENCOUNTER — Emergency Department (HOSPITAL_COMMUNITY): Payer: Medicare Other

## 2022-07-17 ENCOUNTER — Inpatient Hospital Stay (HOSPITAL_COMMUNITY): Payer: Medicare Other

## 2022-07-17 ENCOUNTER — Inpatient Hospital Stay (HOSPITAL_COMMUNITY)
Admission: EM | Admit: 2022-07-17 | Discharge: 2022-07-22 | DRG: 023 | Disposition: A | Discharge status: Rehabilitation Facility | Payer: Medicare Other | Attending: Neurology | Admitting: Neurology

## 2022-07-17 ENCOUNTER — Encounter (HOSPITAL_COMMUNITY)
Admission: EM | Disposition: A | Discharge status: Rehabilitation Facility | Payer: Self-pay | Source: Home / Self Care | Attending: Neurology

## 2022-07-17 ENCOUNTER — Other Ambulatory Visit: Payer: Self-pay

## 2022-07-17 DIAGNOSIS — I480 Paroxysmal atrial fibrillation: Secondary | ICD-10-CM | POA: Diagnosis present

## 2022-07-17 DIAGNOSIS — E739 Lactose intolerance, unspecified: Secondary | ICD-10-CM | POA: Diagnosis not present

## 2022-07-17 DIAGNOSIS — Z6822 Body mass index (BMI) 22.0-22.9, adult: Secondary | ICD-10-CM | POA: Diagnosis not present

## 2022-07-17 DIAGNOSIS — W1830XA Fall on same level, unspecified, initial encounter: Secondary | ICD-10-CM | POA: Diagnosis present

## 2022-07-17 DIAGNOSIS — E119 Type 2 diabetes mellitus without complications: Secondary | ICD-10-CM | POA: Diagnosis present

## 2022-07-17 DIAGNOSIS — R4189 Other symptoms and signs involving cognitive functions and awareness: Secondary | ICD-10-CM | POA: Diagnosis present

## 2022-07-17 DIAGNOSIS — R1310 Dysphagia, unspecified: Secondary | ICD-10-CM | POA: Diagnosis not present

## 2022-07-17 DIAGNOSIS — E785 Hyperlipidemia, unspecified: Secondary | ICD-10-CM | POA: Diagnosis not present

## 2022-07-17 DIAGNOSIS — S0081XA Abrasion of other part of head, initial encounter: Secondary | ICD-10-CM | POA: Diagnosis present

## 2022-07-17 DIAGNOSIS — Z87891 Personal history of nicotine dependence: Secondary | ICD-10-CM

## 2022-07-17 DIAGNOSIS — G9349 Other encephalopathy: Secondary | ICD-10-CM | POA: Diagnosis present

## 2022-07-17 DIAGNOSIS — Z885 Allergy status to narcotic agent status: Secondary | ICD-10-CM | POA: Diagnosis not present

## 2022-07-17 DIAGNOSIS — R7989 Other specified abnormal findings of blood chemistry: Secondary | ICD-10-CM | POA: Diagnosis not present

## 2022-07-17 DIAGNOSIS — I63231 Cerebral infarction due to unspecified occlusion or stenosis of right carotid arteries: Secondary | ICD-10-CM

## 2022-07-17 DIAGNOSIS — K8681 Exocrine pancreatic insufficiency: Secondary | ICD-10-CM | POA: Diagnosis not present

## 2022-07-17 DIAGNOSIS — E43 Unspecified severe protein-calorie malnutrition: Secondary | ICD-10-CM | POA: Insufficient documentation

## 2022-07-17 DIAGNOSIS — R471 Dysarthria and anarthria: Secondary | ICD-10-CM | POA: Diagnosis present

## 2022-07-17 DIAGNOSIS — L219 Seborrheic dermatitis, unspecified: Secondary | ICD-10-CM | POA: Diagnosis not present

## 2022-07-17 DIAGNOSIS — Z881 Allergy status to other antibiotic agents status: Secondary | ICD-10-CM | POA: Diagnosis not present

## 2022-07-17 DIAGNOSIS — Z781 Physical restraint status: Secondary | ICD-10-CM | POA: Diagnosis not present

## 2022-07-17 DIAGNOSIS — R4587 Impulsiveness: Secondary | ICD-10-CM | POA: Diagnosis not present

## 2022-07-17 DIAGNOSIS — Y92015 Private garage of single-family (private) house as the place of occurrence of the external cause: Secondary | ICD-10-CM | POA: Diagnosis not present

## 2022-07-17 DIAGNOSIS — G8194 Hemiplegia, unspecified affecting left nondominant side: Secondary | ICD-10-CM | POA: Diagnosis present

## 2022-07-17 DIAGNOSIS — Z886 Allergy status to analgesic agent status: Secondary | ICD-10-CM

## 2022-07-17 DIAGNOSIS — I63413 Cerebral infarction due to embolism of bilateral middle cerebral arteries: Secondary | ICD-10-CM

## 2022-07-17 DIAGNOSIS — U071 COVID-19: Secondary | ICD-10-CM | POA: Diagnosis not present

## 2022-07-17 DIAGNOSIS — I6381 Other cerebral infarction due to occlusion or stenosis of small artery: Secondary | ICD-10-CM | POA: Diagnosis not present

## 2022-07-17 DIAGNOSIS — E538 Deficiency of other specified B group vitamins: Secondary | ICD-10-CM | POA: Diagnosis not present

## 2022-07-17 DIAGNOSIS — I10 Essential (primary) hypertension: Secondary | ICD-10-CM | POA: Diagnosis present

## 2022-07-17 DIAGNOSIS — H53462 Homonymous bilateral field defects, left side: Secondary | ICD-10-CM | POA: Diagnosis present

## 2022-07-17 DIAGNOSIS — I639 Cerebral infarction, unspecified: Secondary | ICD-10-CM | POA: Diagnosis not present

## 2022-07-17 DIAGNOSIS — I69391 Dysphagia following cerebral infarction: Secondary | ICD-10-CM | POA: Diagnosis not present

## 2022-07-17 DIAGNOSIS — I4891 Unspecified atrial fibrillation: Secondary | ICD-10-CM | POA: Diagnosis not present

## 2022-07-17 DIAGNOSIS — Z888 Allergy status to other drugs, medicaments and biological substances status: Secondary | ICD-10-CM | POA: Diagnosis not present

## 2022-07-17 DIAGNOSIS — I63311 Cerebral infarction due to thrombosis of right middle cerebral artery: Secondary | ICD-10-CM | POA: Diagnosis present

## 2022-07-17 DIAGNOSIS — I69354 Hemiplegia and hemiparesis following cerebral infarction affecting left non-dominant side: Secondary | ICD-10-CM | POA: Diagnosis not present

## 2022-07-17 DIAGNOSIS — R29715 NIHSS score 15: Secondary | ICD-10-CM | POA: Diagnosis present

## 2022-07-17 DIAGNOSIS — J9602 Acute respiratory failure with hypercapnia: Secondary | ICD-10-CM | POA: Diagnosis not present

## 2022-07-17 DIAGNOSIS — Z91018 Allergy to other foods: Secondary | ICD-10-CM

## 2022-07-17 DIAGNOSIS — J9601 Acute respiratory failure with hypoxia: Secondary | ICD-10-CM | POA: Diagnosis present

## 2022-07-17 DIAGNOSIS — R29810 Facial weakness: Secondary | ICD-10-CM | POA: Diagnosis present

## 2022-07-17 DIAGNOSIS — R001 Bradycardia, unspecified: Secondary | ICD-10-CM | POA: Diagnosis not present

## 2022-07-17 DIAGNOSIS — I6389 Other cerebral infarction: Secondary | ICD-10-CM | POA: Diagnosis not present

## 2022-07-17 DIAGNOSIS — E876 Hypokalemia: Secondary | ICD-10-CM | POA: Diagnosis not present

## 2022-07-17 DIAGNOSIS — K900 Celiac disease: Secondary | ICD-10-CM | POA: Diagnosis present

## 2022-07-17 DIAGNOSIS — G479 Sleep disorder, unspecified: Secondary | ICD-10-CM | POA: Diagnosis not present

## 2022-07-17 HISTORY — PX: RADIOLOGY WITH ANESTHESIA: SHX6223

## 2022-07-17 HISTORY — PX: IR ANGIO INTRA EXTRACRAN SEL INTERNAL CAROTID UNI R MOD SED: IMG5362

## 2022-07-17 HISTORY — PX: IR US GUIDE VASC ACCESS RIGHT: IMG2390

## 2022-07-17 HISTORY — PX: IR PERCUTANEOUS ART THROMBECTOMY/INFUSION INTRACRANIAL INC DIAG ANGIO: IMG6087

## 2022-07-17 HISTORY — PX: IR CT HEAD LTD: IMG2386

## 2022-07-17 LAB — CBC
HCT: 38.7 % — ABNORMAL LOW (ref 39.0–52.0)
Hemoglobin: 12.8 g/dL — ABNORMAL LOW (ref 13.0–17.0)
MCH: 33.6 pg (ref 26.0–34.0)
MCHC: 33.1 g/dL (ref 30.0–36.0)
MCV: 101.6 fL — ABNORMAL HIGH (ref 80.0–100.0)
Platelets: 208 10*3/uL (ref 150–400)
RBC: 3.81 MIL/uL — ABNORMAL LOW (ref 4.22–5.81)
RDW: 12.5 % (ref 11.5–15.5)
WBC: 4 10*3/uL (ref 4.0–10.5)
nRBC: 0 % (ref 0.0–0.2)

## 2022-07-17 LAB — DIFFERENTIAL
Abs Immature Granulocytes: 0 10*3/uL (ref 0.00–0.07)
Basophils Absolute: 0 10*3/uL (ref 0.0–0.1)
Basophils Relative: 1 %
Eosinophils Absolute: 0.1 10*3/uL (ref 0.0–0.5)
Eosinophils Relative: 3 %
Immature Granulocytes: 0 %
Lymphocytes Relative: 24 %
Lymphs Abs: 1 10*3/uL (ref 0.7–4.0)
Monocytes Absolute: 0.4 10*3/uL (ref 0.1–1.0)
Monocytes Relative: 10 %
Neutro Abs: 2.5 10*3/uL (ref 1.7–7.7)
Neutrophils Relative %: 62 %

## 2022-07-17 LAB — POCT I-STAT 7, (LYTES, BLD GAS, ICA,H+H)
Acid-base deficit: 5 mmol/L — ABNORMAL HIGH (ref 0.0–2.0)
Bicarbonate: 20.8 mmol/L (ref 20.0–28.0)
Calcium, Ion: 1.17 mmol/L (ref 1.15–1.40)
HCT: 39 % (ref 39.0–52.0)
Hemoglobin: 13.3 g/dL (ref 13.0–17.0)
O2 Saturation: 94 %
Patient temperature: 98.6
Potassium: 4.5 mmol/L (ref 3.5–5.1)
Sodium: 137 mmol/L (ref 135–145)
TCO2: 22 mmol/L (ref 22–32)
pCO2 arterial: 40.5 mmHg (ref 32–48)
pH, Arterial: 7.318 — ABNORMAL LOW (ref 7.35–7.45)
pO2, Arterial: 76 mmHg — ABNORMAL LOW (ref 83–108)

## 2022-07-17 LAB — COMPREHENSIVE METABOLIC PANEL
ALT: 17 U/L (ref 0–44)
AST: 22 U/L (ref 15–41)
Albumin: 3.1 g/dL — ABNORMAL LOW (ref 3.5–5.0)
Alkaline Phosphatase: 48 U/L (ref 38–126)
Anion gap: 9 (ref 5–15)
BUN: 16 mg/dL (ref 8–23)
CO2: 22 mmol/L (ref 22–32)
Calcium: 7.9 mg/dL — ABNORMAL LOW (ref 8.9–10.3)
Chloride: 108 mmol/L (ref 98–111)
Creatinine, Ser: 1 mg/dL (ref 0.61–1.24)
GFR, Estimated: >60 mL/min (ref >60)
Glucose, Bld: 110 mg/dL — ABNORMAL HIGH (ref 70–99)
Potassium: 4.2 mmol/L (ref 3.5–5.1)
Sodium: 139 mmol/L (ref 135–145)
Total Bilirubin: 0.7 mg/dL (ref 0.3–1.2)
Total Protein: 5.8 g/dL — ABNORMAL LOW (ref 6.5–8.1)

## 2022-07-17 LAB — I-STAT CHEM 8, ED
BUN: 17 mg/dL (ref 8–23)
Calcium, Ion: 1.05 mmol/L — ABNORMAL LOW (ref 1.15–1.40)
Chloride: 106 mmol/L (ref 98–111)
Creatinine, Ser: 1.1 mg/dL (ref 0.61–1.24)
Glucose, Bld: 109 mg/dL — ABNORMAL HIGH (ref 70–99)
HCT: 40 % (ref 39.0–52.0)
Hemoglobin: 13.6 g/dL (ref 13.0–17.0)
Potassium: 4.1 mmol/L (ref 3.5–5.1)
Sodium: 140 mmol/L (ref 135–145)
TCO2: 24 mmol/L (ref 22–32)

## 2022-07-17 LAB — APTT: aPTT: 23 seconds — ABNORMAL LOW (ref 24–36)

## 2022-07-17 LAB — CBG MONITORING, ED: Glucose-Capillary: 112 mg/dL — ABNORMAL HIGH (ref 70–99)

## 2022-07-17 LAB — PROTIME-INR
INR: 1 (ref 0.8–1.2)
Prothrombin Time: 13.5 seconds (ref 11.4–15.2)

## 2022-07-17 LAB — ETHANOL: Alcohol, Ethyl (B): <10 mg/dL (ref <10)

## 2022-07-17 LAB — GLUCOSE, CAPILLARY
Glucose-Capillary: 144 mg/dL — ABNORMAL HIGH (ref 70–99)
Glucose-Capillary: 105 mg/dL — ABNORMAL HIGH (ref 70–99)

## 2022-07-17 LAB — MRSA NEXT GEN BY PCR, NASAL: MRSA by PCR Next Gen: NOT DETECTED

## 2022-07-17 SURGERY — IR WITH ANESTHESIA
Anesthesia: General

## 2022-07-17 MED ORDER — STROKE: EARLY STAGES OF RECOVERY BOOK: Freq: Once | Status: AC

## 2022-07-17 MED ORDER — SENNOSIDES-DOCUSATE SODIUM 8.6-50 MG PO TABS: 1.0000 | ORAL_TABLET | Freq: Two times a day (BID) | ORAL | Status: DC

## 2022-07-17 MED ORDER — FENTANYL CITRATE PF 50 MCG/ML IJ SOSY: 12.5000 ug | PREFILLED_SYRINGE | Freq: Once | INTRAMUSCULAR | Status: DC

## 2022-07-17 MED ORDER — LABETALOL HCL 5 MG/ML IV SOLN: 20.0000 mg | Freq: Once | INTRAVENOUS | Status: DC

## 2022-07-17 MED ORDER — PROPOFOL 10 MG/ML IV BOLUS
INTRAVENOUS | Status: DC | PRN
  Administered 2022-07-17: 130 mg via INTRAVENOUS

## 2022-07-17 MED ORDER — DOCUSATE SODIUM 50 MG/5ML PO LIQD: 100.0000 mg | Freq: Two times a day (BID) | ORAL | Status: DC

## 2022-07-17 MED ORDER — FENTANYL CITRATE PF 50 MCG/ML IJ SOSY
50.0000 ug | PREFILLED_SYRINGE | INTRAMUSCULAR | Status: DC | PRN
  Administered 2022-07-17 – 2022-07-20 (×5): 50 ug via INTRAVENOUS
  Filled 2022-07-17 (×6): qty 1

## 2022-07-17 MED ORDER — ORAL CARE MOUTH RINSE: 15.0000 mL | OROMUCOSAL | Status: DC | PRN

## 2022-07-17 MED ORDER — ORAL CARE MOUTH RINSE
15.0000 mL | OROMUCOSAL | Status: DC
  Administered 2022-07-17 – 2022-07-18 (×9): 15 mL via OROMUCOSAL

## 2022-07-17 MED ORDER — SUCCINYLCHOLINE CHLORIDE 200 MG/10ML IV SOSY
PREFILLED_SYRINGE | INTRAVENOUS | Status: DC | PRN
  Administered 2022-07-17: 120 mg via INTRAVENOUS

## 2022-07-17 MED ORDER — FENTANYL CITRATE (PF) 100 MCG/2ML IJ SOLN
INTRAMUSCULAR | Status: AC
  Filled 2022-07-17: qty 2

## 2022-07-17 MED ORDER — TENECTEPLASE FOR STROKE
0.2500 mg/kg | PACK | Freq: Once | INTRAVENOUS | Status: AC
  Administered 2022-07-17: 18 mg via INTRAVENOUS
  Filled 2022-07-17: qty 10

## 2022-07-17 MED ORDER — CHLORHEXIDINE GLUCONATE CLOTH 2 % EX PADS
6.0000 | MEDICATED_PAD | Freq: Every day | CUTANEOUS | Status: DC
  Administered 2022-07-17 – 2022-07-18 (×2): 6 via TOPICAL

## 2022-07-17 MED ORDER — FENTANYL CITRATE PF 50 MCG/ML IJ SOSY: 50.0000 ug | PREFILLED_SYRINGE | INTRAMUSCULAR | Status: DC | PRN

## 2022-07-17 MED ORDER — PROPOFOL 1000 MG/100ML IV EMUL
0.0000 ug/kg/min | INTRAVENOUS | Status: DC
  Administered 2022-07-17: 50 ug/kg/min via INTRAVENOUS
  Administered 2022-07-18: 40 ug/kg/min via INTRAVENOUS
  Administered 2022-07-18: 30 ug/kg/min via INTRAVENOUS
  Filled 2022-07-17 (×3): qty 100
  Filled 2022-07-17: qty 200
  Filled 2022-07-17: qty 100

## 2022-07-17 MED ORDER — FENTANYL CITRATE (PF) 100 MCG/2ML IJ SOLN: 25.0000 ug | INTRAMUSCULAR | Status: DC | PRN

## 2022-07-17 MED ORDER — SODIUM CHLORIDE 0.9 % IV SOLN
3.0000 g | Freq: Four times a day (QID) | INTRAVENOUS | Status: DC
  Administered 2022-07-17 – 2022-07-22 (×18): 3 g via INTRAVENOUS
  Filled 2022-07-17 (×19): qty 8

## 2022-07-17 MED ORDER — FENTANYL CITRATE PF 50 MCG/ML IJ SOSY
25.0000 ug | PREFILLED_SYRINGE | INTRAMUSCULAR | Status: DC | PRN
  Administered 2022-07-17: 100 ug via INTRAVENOUS
  Filled 2022-07-17: qty 2

## 2022-07-17 MED ORDER — SODIUM CHLORIDE 0.9 % IV SOLN: INTRAVENOUS | Status: DC

## 2022-07-17 MED ORDER — LACTATED RINGERS IV SOLN: INTRAVENOUS | Status: DC | PRN

## 2022-07-17 MED ORDER — CEFAZOLIN SODIUM-DEXTROSE 2-4 GM/100ML-% IV SOLN
INTRAVENOUS | Status: AC
  Filled 2022-07-17: qty 100

## 2022-07-17 MED ORDER — PANTOPRAZOLE SODIUM 40 MG IV SOLR
40.0000 mg | Freq: Every day | INTRAVENOUS | Status: DC
  Administered 2022-07-17: 40 mg via INTRAVENOUS
  Filled 2022-07-17: qty 10

## 2022-07-17 MED ORDER — SODIUM CHLORIDE 0.9 % IV BOLUS: 1000.0000 mL | Freq: Once | INTRAVENOUS | Status: DC

## 2022-07-17 MED ORDER — CLEVIDIPINE BUTYRATE 0.5 MG/ML IV EMUL
0.0000 mg/h | INTRAVENOUS | Status: DC
  Administered 2022-07-17: 4 mg/h via INTRAVENOUS
  Administered 2022-07-17 (×2): 32 mg/h via INTRAVENOUS
  Administered 2022-07-18: 16 mg/h via INTRAVENOUS
  Administered 2022-07-18: 14 mg/h via INTRAVENOUS
  Administered 2022-07-18 (×2): 12 mg/h via INTRAVENOUS
  Filled 2022-07-17: qty 50
  Filled 2022-07-17 (×6): qty 100

## 2022-07-17 MED ORDER — FAMOTIDINE 20 MG PO TABS
20.0000 mg | ORAL_TABLET | Freq: Two times a day (BID) | ORAL | Status: DC
  Administered 2022-07-18 – 2022-07-22 (×8): 20 mg
  Filled 2022-07-17 (×9): qty 1

## 2022-07-17 MED ORDER — POLYETHYLENE GLYCOL 3350 17 G PO PACK
17.0000 g | PACK | Freq: Every day | ORAL | Status: DC
  Administered 2022-07-19 – 2022-07-22 (×4): 17 g
  Filled 2022-07-17 (×4): qty 1

## 2022-07-17 MED ORDER — PROPOFOL 500 MG/50ML IV EMUL
INTRAVENOUS | Status: DC | PRN
  Administered 2022-07-17: 50 ug/kg/min via INTRAVENOUS

## 2022-07-17 MED ORDER — IOHEXOL 350 MG/ML SOLN
75.0000 mL | Freq: Once | INTRAVENOUS | Status: AC | PRN
  Administered 2022-07-17: 75 mL via INTRAVENOUS

## 2022-07-17 MED ORDER — ONDANSETRON HCL 4 MG/2ML IJ SOLN: 4.0000 mg | Freq: Three times a day (TID) | INTRAMUSCULAR | Status: DC | PRN

## 2022-07-17 MED ORDER — IOHEXOL 300 MG/ML  SOLN
100.0000 mL | Freq: Once | INTRAMUSCULAR | Status: AC | PRN
  Administered 2022-07-17: 40 mL via INTRA_ARTERIAL

## 2022-07-17 MED ORDER — ROCURONIUM BROMIDE 10 MG/ML (PF) SYRINGE
PREFILLED_SYRINGE | INTRAVENOUS | Status: DC | PRN
  Administered 2022-07-17: 50 mg via INTRAVENOUS

## 2022-07-17 MED ORDER — HYDRALAZINE HCL 20 MG/ML IJ SOLN
10.0000 mg | INTRAMUSCULAR | Status: DC | PRN
  Administered 2022-07-17: 10 mg via INTRAVENOUS
  Filled 2022-07-17 (×2): qty 1

## 2022-07-17 MED ORDER — CLEVIDIPINE BUTYRATE 0.5 MG/ML IV EMUL: 0.0000 mg/h | INTRAVENOUS | Status: DC

## 2022-07-17 MED ORDER — PANTOPRAZOLE SODIUM 40 MG IV SOLR: 40.0000 mg | Freq: Every day | INTRAVENOUS | Status: DC

## 2022-07-17 NOTE — Sedation Documentation (Addendum)
Critical care NP at bedside in PACU

## 2022-07-17 NOTE — ED Provider Notes (Signed)
[facility] EMERGENCY DEPARTMENT Provider Note   CSN: [id number] Arrival date & time: 07/17/22  1230  An emergency department physician performed an initial assessment on this suspected stroke patient at 1232.  History Chief Complaint  Patient presents with   Code Stroke    HPI Douglas Cox is a 71 y.o. male presenting for chief complaint of left-sided hemiparesis. Patient reports that he felt totally fine this morning.  He went to work on something in his crawl space and when he got down flat in preparation to crawl into the building, he could no longer get up.  He endorses sudden onset left-sided weakness at that time at 11:30 AM.  He denies fevers or chills or nausea vomiting shortness of breath is otherwise asymptomatic prior to this.   Patient's recorded medical, surgical, social, medication list and allergies were reviewed in the Snapshot window as part of the initial history.   Review of Systems   Review of Systems  Unable to perform ROS: Acuity of condition    Physical Exam Updated Vital Signs BP (!) 133/96   Pulse 63   Temp 98.3 F (36.8 C) (Axillary)   Resp 19   Wt 70.8 kg   SpO2 100%   BMI 22.40 kg/m  Physical Exam Vitals and nursing note reviewed.  Constitutional:      General: He is not in acute distress.    Appearance: He is well-developed.  HENT:     Head: Normocephalic and atraumatic.  Eyes:     Conjunctiva/sclera: Conjunctivae normal.  Cardiovascular:     Rate and Rhythm: Normal rate and regular rhythm.     Heart sounds: No murmur heard. Pulmonary:     Effort: Pulmonary effort is normal. No respiratory distress.     Breath sounds: Normal breath sounds.  Abdominal:     Palpations: Abdomen is soft.     Tenderness: There is no abdominal tenderness.  Musculoskeletal:        General: No swelling.     Cervical back: Neck supple.  Skin:    General: Skin is warm and dry.     Capillary Refill: Capillary refill takes less than  2 seconds.  Neurological:     Mental Status: He is alert.  Psychiatric:        Mood and Affect: Mood normal.      ED Course/ Medical Decision Making/ A&P Clinical Course as of 07/18/22 0724  Sat Jul 17, 2022  1253 Evaulated at bedside. No family for consent for TNK. Dense left hemiparesis. 2 physician consent.  Patient denied Blood thinner, stated he felt fine at 1130.  Laid down on the garage floor and lost ability to get back up.  He has left-sided hemineglect, left-sided paresis. [CC]    Clinical Course User Index [CC] Tretha Sciara, MD    Procedures .Critical Care  Performed by: Tretha Sciara, MD Authorized by: Tretha Sciara, MD   Critical care provider statement:    Critical care time (minutes):  30   Critical care was necessary to treat or prevent imminent or life-threatening deterioration of the following conditions:  CNS failure or compromise   Critical care was time spent personally by me on the following activities:  Development of treatment plan with patient or surrogate, discussions with consultants, evaluation of patient's response to treatment, examination of patient, ordering and review of laboratory studies, ordering and review of radiographic studies, ordering and performing treatments and interventions, pulse oximetry, re-evaluation of patient's condition and review of  old charts    Medications Ordered in ED Medications   stroke: early stages of recovery book (has no administration in time range)  0.9 %  sodium chloride infusion ( Intravenous Infusion Verify 07/18/22 0700)  senna-docusate (Senokot-S) tablet 1 tablet (1 tablet Oral Not Given 07/17/22 2114)  pantoprazole (PROTONIX) injection 40 mg (40 mg Intravenous Given 07/17/22 2119)  labetalol (NORMODYNE) injection 20 mg (has no administration in time range)  fentaNYL (SUBLIMAZE) 100 MCG/2ML injection (has no administration in time range)  ceFAZolin (ANCEF) 2-4 GM/100ML-% IVPB (has no  administration in time range)  clevidipine (CLEVIPREX) infusion 0.5 mg/mL (12 mg/hr Intravenous Infusion Verify 07/18/22 0700)  famotidine (PEPCID) tablet 20 mg (20 mg Per Tube Not Given 07/17/22 2114)  docusate (COLACE) 50 MG/5ML liquid 100 mg (100 mg Per Tube Not Given 07/17/22 2113)  polyethylene glycol (MIRALAX / GLYCOLAX) packet 17 g (17 g Per Tube Not Given 07/17/22 1656)  Oral care mouth rinse (15 mLs Mouth Rinse Given 07/18/22 0549)  propofol (DIPRIVAN) 1000 MG/100ML infusion (30 mcg/kg/min  70.8 kg Intravenous Infusion Verify 07/18/22 0700)  Chlorhexidine Gluconate Cloth 2 % PADS 6 each (6 each Topical Given 07/17/22 1540)  Oral care mouth rinse (has no administration in time range)  ondansetron (ZOFRAN) injection 4 mg (has no administration in time range)  Ampicillin-Sulbactam (UNASYN) 3 g in sodium chloride 0.9 % 100 mL IVPB (0 g Intravenous Stopped 07/18/22 0617)  fentaNYL (SUBLIMAZE) injection 50 mcg (50 mcg Intravenous Given 07/17/22 2336)  hydrALAZINE (APRESOLINE) injection 10 mg (10 mg Intravenous Given 07/17/22 2307)  labetalol (NORMODYNE) injection 10 mg (10 mg Intravenous Given 07/18/22 0019)  tenecteplase (TNKASE) injection for Stroke 18 mg (18 mg Intravenous Given 07/17/22 1253)  iohexol (OMNIPAQUE) 350 MG/ML injection 75 mL (75 mLs Intravenous Contrast Given 07/17/22 1302)  iohexol (OMNIPAQUE) 300 MG/ML solution 100 mL (40 mLs Intra-arterial Contrast Given 07/17/22 1411)  iohexol (OMNIPAQUE) 350 MG/ML injection 75 mL (75 mLs Intravenous Contrast Given 07/17/22 1909)   Medical Decision Making:    Douglas Cox is a 71 y.o. male  who presented to the ED today with left sided weakness.  Due to these symptoms, nursing activated a CODE STROKE per hospital protocol.   Patient placed on continuous vitals and telemetry monitoring while in ED which was reviewed periodically.   On my initial exam, the pt was in no acute distress, glucose was WNL and deficits include dense  hemiparesis.  Deficits are  persistent.   Reviewed and confirmed nursing documentation for past medical history, family history, social history.   Notably an effort was made to reach out to contacts regarding the following historical factors concerning contraindications for TPA: Unfortunately, patient cannot provide any further context and is not a candidate due to lack of available collateral information for safe administration of thrombolytics. Not a candidate due to timing of symptoms. No history of intracranial malignancy, no history of recent cranial/spinal/general surgery within 3 months, no recent CVA within 3 months, duration of symptoms less than 4.5 hours, no history of uncontrolled HTN, no anticoagulation use.     Initial Assessment and Plan:   Patient immediately evaluated jointly by tele-neurology and emergency department providers.   This is most consistent with an acute life/limb threatening illness complicated by underlying chronic conditions.  Patient's HPI and PE findings are most consistent with acute CVA. Proceeded with [facility] and CTA per institutional stroke policy which revealed LVO. Given the timing and severity of the patient's symptoms, patient is a TPA candidate  per neurology. Treatment initiated and patient monitored Q23mnutes while being arranged for admission to the neurology ICU. BP monitored   to ensure safe administration of the thrombolytics. Patient transferred into the ICU at this time with no further acute events   Patient taken to IR suite for immediate intervention.    Clinical Impression:  1. Cerebral infarction, unspecified mechanism ([facility]      To OR/IR/Endo...   Final Clinical Impression(s) / ED Diagnoses Final diagnoses:  Cerebral infarction, unspecified mechanism ([facility]    Rx / [place] ED Discharge Orders     None         CTretha Sciara MD 07/18/22 [phone]

## 2022-07-17 NOTE — Progress Notes (Signed)
Add unasyn for empiric aspiration coverage.   CXR images reviewed, no overt infiltrate. Consider de-escalation if subsequent imaging remains clear.     Noe Gens, MSN, APRN, NP-C, AGACNP-BC [facility] 07/17/2022, 4:56 PM   Please see [email or web address] for pager details.   From 7A-7P if no response, please call [phone] After hours, please call ELink [phone]

## 2022-07-17 NOTE — Progress Notes (Signed)
Addendum to H&P:    Imaging from 08/10/2021 shows stable T1 hyperintense lesion of the anterior mediastinum with no evidence of intrinsic enhancement, favor benign etiology such as proteinaceous thymic or cardiac cyst.  MD aware.    Noe Gens, MSN, APRN, NP-C, AGACNP-BC [facility] 07/17/2022, 4:54 PM   Please see [email or web address] for pager details.   From 7A-7P if no response, please call [phone] After hours, please call ELink [phone]

## 2022-07-17 NOTE — Progress Notes (Signed)
E-link updated that patient's blood pressure is staying above goal of 120-140 despite being on max dose cleviprex. Pain/agitation treated with max dose propofol and PRN fentanyl as well. Awaiting new orders.

## 2022-07-17 NOTE — Progress Notes (Signed)
On arrival to 4N20 patient with purposeful movement to LUE. While charting, his RN realized this exam was inconsistent with patient exam in the ED Pt without movement to pain to entire Right side but continues to be purposeful to LUE and withdraws to pain on LLE (See NIHSS)  Dr. Cheral Marker made aware of this change. STAT head CT obtained.   Dr. Karenann Cai notified of this change as well. New order for STAT CTA head and neck.   CT called for this, they are unable to take this patient without approval d/t prior CTA at 1200 and amount of contrast. They will call back when they are ready.  RN will continue to monitor.

## 2022-07-17 NOTE — Transfer of Care (Signed)
Immediate Anesthesia Transfer of Care Note  Patient: Douglas Cox  Procedure(s) Performed: IR WITH ANESTHESIA  Patient Location: ICU  Anesthesia Type:General  Level of Consciousness: Patient remains intubated per anesthesia plan  Airway & Oxygen Therapy: Patient remains intubated per anesthesia plan and Patient placed on Ventilator (see vital sign flow sheet for setting)  Post-op Assessment: Report given to RN and Post -op Vital signs reviewed and stable  Post vital signs: Reviewed and stable  Last Vitals:  Vitals Value Taken Time  BP 163/77 07/17/22 1531  Temp    Pulse 61 07/17/22 1541  Resp 22 07/17/22 1541  SpO2 100 % 07/17/22 1541  Vitals shown include unvalidated device data.  Last Pain: There were no vitals filed for this visit.       Complications: No notable events documented.

## 2022-07-17 NOTE — Progress Notes (Signed)
PHARMACIST CODE STROKE RESPONSE  Notified to mix TNK at 12:51 by Dr. Cheral Marker  TNK preparation completed at 12:53, given immediately after mixed   TNK dose = 18 mg IV over 5 seconds  Issues/delays encountered (if applicable): N/A   Esmeralda Arthur, PharmD, BCCCP  07/17/22 1:03 PM

## 2022-07-17 NOTE — Consult Note (Signed)
NAME:  Douglas Cox, MRN:  [id number], DOB:  [birth year], LOS: 0 ADMISSION DATE:  07/17/2022, CONSULTATION DATE:  07/17/22 REFERRING MD:  Dr. Ladean Raya, CHIEF COMPLAINT:  AMS   History of Present Illness:  72 y/o M who presented to [facility] on 12/16 with reports of AMS.    Patient presented to the ER via EMS after friend found him on the floor of the garage.  The patient reportedly slid onto the floor, no LOC.  He reported a headache.  His friend called EMS with noted left-sided weakness.  Last known well 11:30 AM.  He presented to the ER as a code stroke.  Initial CT of the head showed loss of gray-white differentiation in the right insular cortex, lateral aspect of the right lentiform nucleus and right temporal tip consistent with acute infarct.  Hyperdense right ICA terminus and proximal right M1 segment consistent with acute thrombus.  On exam he was documented to have a right gaze preference, left hemianopia, left facial droop, left arm weakness, left leg weakness, dysarthria and decreased sensation.  The patient was treated emergently with TNK (18 mg IV at 1253).  Post TNK the patient was taken to neuro interventional radiology for emergent thrombectomy.  Prior to intubation the patient reported nausea and vomited.  He subsequently was intubated and due to emesis remained intubated post procedure.  PCCM consulted for assistance with ICU care post procedure.  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  S/p thrombectomy and TNK.  Interim History / Subjective:  As above  Objective   Blood pressure 139/73, pulse (!) 52, resp. rate 18, weight 70.8 kg, SpO2 100 %.    Vent Mode: PRVC FiO2 (%):  [40 %] 40 % Set Rate:  [18 bmp] 18 bmp Vt Set:  [580 mL] 580  mL PEEP:  [5 cmH20] 5 cmH20 Plateau Pressure:  [13 cmH20] 13 cmH20  No intake or output data in the 24 hours ending 07/17/22 1529 Filed Weights   07/17/22 1200  Weight: 70.8 kg    Examination: General: Elderly adult male lying in bed in no acute distress on vent HENT: MM pink/moist, clear secretions draining from mouth, brown particles suctioned from posterior airway orally, forehead abrasion with edema, pupils equal and reactive Lungs: Nonlabored at rest, lungs bilaterally with coarse rhonchi Cardiovascular: S1-S2 RRR, no MRG Abdomen: Soft/nondistended, BS x 4 active Extremities: Warm/dry, no edema Neuro: Sedate on propofol, clearing paralytic with mild coughing and ventilator dyssynchrony noted  Resolved Hospital Problem list     Assessment & Plan:   Acute R ICA / M1 Thrombus with CVA  S/p thrombectomy of right ICA terminus at level of a fetal right PCA. Complete recanalization, no hemorrhage or complication -admit to ICU per Neurology / Neuro IR -SBP goal 120-140  -plan for repeat imaging in am  -follow neuro exam  -bed rest for 6 hours  -follow right groin insertion site  -secondary stroke prevention per Neuro  -assess ECHO, HgbA1c, EKG, UDS -cleviprex if needed to maintain BP goal  -will need SLP, PT, OT post extubation   Acute Encephalopathy secondary to CVA  -PAD protocol with propofol, PRN fentanyl  -RASS Goal 0 to -1  -minimize sedating medications as able to allow for neuro exam  -at risk delirium  Acute Hypoxic Respiratory Failure secondary to CVA  -PRVC with LTVV  -wean PEEP / fiO2 for sats >90% -assess CXR now  -ABG one hour post arrival  -SBT in am with goal for extubation  -sedation as above  -aspiration precautions   Nausea / Vomiting  -PRN zofran   At Risk Malnutrition  Albumin 3.1 on admit  -begin TF in am if note extubated   Best Practice (right click and "Reselect all SmartList Selections" daily)  Diet/type: NPO DVT prophylaxis: SCD GI  prophylaxis: PPI Lines: N/A Foley:  N/A Code Status:  full code Last date of multidisciplinary goals of care discussion: per primary  Labs   CBC: Recent Labs  Lab 07/17/22 1237 07/17/22 1238  WBC 4.0  --   NEUTROABS 2.5  --   HGB 12.8* 13.6  HCT 38.7* 40.0  MCV 101.6*  --   PLT 208  --     Basic Metabolic Panel: Recent Labs  Lab 07/17/22 1237 07/17/22 1238  NA 139 140  K 4.2 4.1  CL 108 106  CO2 22  --   GLUCOSE 110* 109*  BUN 16 17  CREATININE 1.00 1.10  CALCIUM 7.9*  --    GFR: Estimated Creatinine Clearance: 60.8 mL/min (by C-G formula based on SCr of 1.1 mg/dL). Recent Labs  Lab 07/17/22 1237  WBC 4.0    Liver Function Tests: Recent Labs  Lab 07/17/22 1237  AST 22  ALT 17  ALKPHOS 48  BILITOT 0.7  PROT 5.8*  ALBUMIN 3.1*   No results for input(s): "LIPASE", "AMYLASE" in the last 168 hours. No results for input(s): "AMMONIA" in the last 168 hours.  ABG    Component Value Date/Time   TCO2 24 07/17/2022 1238     Coagulation Profile: Recent Labs  Lab 07/17/22 1237  INR 1.0    Cardiac Enzymes: No results for input(s): "CKTOTAL", "CKMB", "CKMBINDEX", "TROPONINI" in the last 168 hours.  HbA1C: No results found for: "HGBA1C"  CBG: Recent Labs  Lab 07/17/22 1232  GLUCAP 112*    Review of Systems:   Unable to complete as patient is altered on mechanical ventilation.   Past Medical History:  He,  has a past medical history of Celiac disease and Retinal detachment.   Surgical History:   Past Surgical History:  Procedure Laterality Date   CATARACT EXTRACTION Bilateral    Dr. Valetta Close   EYE SURGERY     FRACTURE SURGERY     GAS INSERTION Right 05/21/2021   Procedure: INSERTION OF GAS - C3F8;  Surgeon: Bernarda Caffey, MD;  Location: [facility];  Service: Ophthalmology;  Laterality: Right;   GAS/FLUID EXCHANGE Right 05/21/2021   Procedure: GAS/FLUID EXCHANGE;  Surgeon: Bernarda Caffey, MD;  Location: [facility];  Service: Ophthalmology;   Laterality: Right;   HERNIA REPAIR     PARS PLANA VITRECTOMY Right 05/21/2021   Procedure: PARS PLANA VITRECTOMY WITH 25 GAUGE;  Surgeon: Bernarda Caffey, MD;  Location: [facility];  Service: Ophthalmology;  Laterality: Right;   PERFLUORONE INJECTION Right 05/21/2021   Procedure: PERFLUORONE INJECTION;  Surgeon: Bernarda Caffey, MD;  Location: [place];  Service: Ophthalmology;  Laterality: Right;   PHOTOCOAGULATION WITH LASER Right 05/21/2021   Procedure: PHOTOCOAGULATION WITH LASER;  Surgeon: Bernarda Caffey, MD;  Location: [facility] [facility];  Service: Ophthalmology;  Laterality: Right;   RETINAL DETACHMENT SURGERY       Social History:   reports that he quit smoking about 16 years ago. His smoking use included cigarettes. He has  never used smokeless tobacco. He reports current alcohol use of about 4.0 - 5.0 standard drinks of alcohol per week. He reports current drug use. Frequency: 7.00 times per week. Drug: Marijuana.   Family History:  His family history is not on file.   Allergies Allergies  Allergen Reactions   Codone [Hydrocodone] Hives   Nitrous Oxide     Can cause blindness in right eye    Tylenol [Acetaminophen] Hives   Clarithromycin Anxiety     Home Medications  Prior to Admission medications   Medication Sig Start Date End Date Taking? Authorizing Provider  acidophilus (RISAQUAD) CAPS capsule Take 1 capsule by mouth daily. Align once daily    [provider]  cholecalciferol (VITAMIN D3) 25 MCG (1000 UNIT) tablet Take 1,000 Units by mouth daily.    [provider]  Cyanocobalamin 1000 MCG/ML KIT Inject 1 mL as directed. Every 2 weeks    [provider]  prednisoLONE acetate (PRED FORTE) 1 % ophthalmic suspension Place 1 drop into the left eye 4 (four) times daily. Patient not taking: Reported on 08/04/2021 06/05/21   Bernarda Caffey, MD  psyllium (METAMUCIL) 58.6 % powder Take 1 packet by mouth daily.    [provider]     Critical care time: 58 minutes      Noe Gens, MSN, APRN, NP-C, AGACNP-BC [facility] 07/17/2022, 3:29 PM   Please see [email or web address] for pager details.   From 7A-7P if no response, please call [phone] After hours, please call ELink [phone]

## 2022-07-17 NOTE — Plan of Care (Signed)
Patient transported from 4N ICU to CT scan for the second time and back to 4N ICU without issue.

## 2022-07-17 NOTE — ED Triage Notes (Signed)
Pt BIB [facility] from home due to friend calling EMS as pt was seen having left sided weakness.  Slid onto floor from working in garage.  LKW 1130.  Pt reports of headache. VS BP 147/73, pulse 55.  18g right AC

## 2022-07-17 NOTE — Code Documentation (Signed)
Stroke Response Nurse Documentation Code Documentation  Nickolaos Brallier is a 71 y.o. male arriving to [facility]  via [facility] on 07-17-22 with past medical hx of Celiac disease, retinal detachment, hemorrhoids. On No antithrombotic. Code stroke was activated by EMS.   Patient was working in garage this AM when he suddenly lowered himself to the ground at 1130 and was unable to get back up. He called his friend and then EMS was called.  Stroke team at the bedside on patient arrival. Labs drawn and patient cleared for CT by EDP. Patient to CT with team. NIHSS 15, see documentation for details and code stroke times. Patient with right gaze preference , left hemianopia, left facial droop, left arm weakness, left leg weakness, left decreased sensation, dysarthria , and left neglect on exam.   The following imaging was completed:  CT Head and CTA. Patient is a candidate for IV Thrombolytic due to stroke. Patient is a candidate for IR due to MCA.   Wife [name] is out of town and unable to be reached initially, emergent two physician consent obtained for TNK: TNK given 1253. Wife called back, phone consent obtained for IR.   Care Plan:  mNIHSS and VS post TNK protocol--q24mx2h, q34m 6h, then q1h x16h  Bedside handoff with IR RN GrMarya Amsler   MaCandace Cruise  Rapid Response RN

## 2022-07-17 NOTE — Plan of Care (Signed)
Patient transported from IR to 4N ICU. ETT in place. 7.5 @ 23. Settings as charted.  Patient transported from 4N ICU to CT scan and back to 4N ICU without issue.

## 2022-07-17 NOTE — Progress Notes (Signed)
Belongings include: Occupational hygienist Cell phone Lighter Keys  Yellow gold wedding band

## 2022-07-17 NOTE — Progress Notes (Signed)
ABG reviewed, rate adjusted, repeat ABG pending    Noe Gens, MSN, APRN, NP-C, AGACNP-BC [facility] 07/17/2022, 4:51 PM   Please see [email or web address] for pager details.   From 7A-7P if no response, please call [phone] After hours, please call ELink [phone]

## 2022-07-17 NOTE — ED Notes (Signed)
Pt transported to IR by this RN and Jarrett Soho, Stroke RN

## 2022-07-17 NOTE — Anesthesia Procedure Notes (Signed)
Procedure Name: Intubation Date/Time: 07/17/2022 1:32 PM  Performed by: Colin Benton, CRNAPre-anesthesia Checklist: Patient identified, Emergency Drugs available, Suction available and Patient being monitored Patient Re-evaluated:Patient Re-evaluated prior to induction Oxygen Delivery Method: Circle System Utilized Preoxygenation: Pre-oxygenation with 100% oxygen Induction Type: IV induction Ventilation: Mask ventilation without difficulty Laryngoscope Size: Glidescope and 4 Grade View: Grade I Tube type: Oral Tube size: 7.5 mm Number of attempts: 1 Airway Equipment and Method: Stylet and Oral airway Placement Confirmation: ETT inserted through vocal cords under direct vision, positive ETCO2 and breath sounds checked- equal and bilateral Tube secured with: Tape Dental Injury: Teeth and Oropharynx as per pre-operative assessment

## 2022-07-17 NOTE — Progress Notes (Signed)
E-link notified that patient can not have an ABG drawn due to TNK administration within the last 24 hours. RN verified that with neurology MD at bedside. E-link notified of patient's bradycardia.

## 2022-07-17 NOTE — Progress Notes (Signed)
Pharmacy Antibiotic Note  Douglas Cox is a 71 y.o. male admitted on 07/17/2022 presenting as code stroke, now intubated and concern for aspiration.  Pharmacy has been consulted for Unasyn dosing.  Plan: Unasyn 3g IV every 6 hours Monitor renal function, LOT  Weight: 70.8 kg (156 lb 1.4 oz)  No data recorded.  Recent Labs  Lab 07/17/22 1237 07/17/22 1238  WBC 4.0  --   CREATININE 1.00 1.10    Estimated Creatinine Clearance: 60.8 mL/min (by C-G formula based on SCr of 1.1 mg/dL).    Allergies  Allergen Reactions   Codone [Hydrocodone] Hives   Nitrous Oxide     Can cause blindness in right eye    Tylenol [Acetaminophen] Hives   Clarithromycin Anxiety    Bertis Ruddy, PharmD, [place] Pharmacist ED Pharmacist Phone # [phone] 07/17/2022 4:53 PM

## 2022-07-17 NOTE — Progress Notes (Addendum)
Dr. Cheral Marker made aware that patient's SBP above goal. New orders from Dr. Cheral Marker for increased max dosage of cleviprex to 32 mg/hr.  Verbal orders to discontinue normal saline bolus.

## 2022-07-17 NOTE — Progress Notes (Signed)
E-link and Dr. Cheral Marker made aware patient's SBP was below goal. Dr. Cheral Marker gave orders for a normal saline bolus and to increase maintenance fluid rate to 100 mL/hour.

## 2022-07-17 NOTE — Anesthesia Preprocedure Evaluation (Signed)
Anesthesia Evaluation  Patient identified by MRN, date of birth, ID bandPreop documentation limited or incomplete due to emergent nature of procedure.  Airway Mallampati: II  TM Distance: >3 FB Neck ROM: Full    Dental no notable dental hx.    Pulmonary neg pulmonary ROS, former smoker   Pulmonary exam normal        Cardiovascular negative cardio ROS  Rhythm:Regular Rate:Normal     Neuro/Psych CODE STROKE CVA  negative psych ROS   GI/Hepatic negative GI ROS, Neg liver ROS,,,  Endo/Other  negative endocrine ROS    Renal/GU negative Renal ROS  negative genitourinary   Musculoskeletal negative musculoskeletal ROS (+)    Abdominal Normal abdominal exam  (+)   Peds  Hematology negative hematology ROS (+)   Anesthesia Other Findings   Reproductive/Obstetrics                             Anesthesia Physical Anesthesia Plan  ASA: 4 and emergent  Anesthesia Plan: General   Post-op Pain Management:    Induction: Intravenous and Rapid sequence  PONV Risk Score and Plan: 2 and Ondansetron, Dexamethasone and Treatment may vary due to age or medical condition  Airway Management Planned: Mask and Oral ETT  Additional Equipment: Arterial line  Intra-op Plan:   Post-operative Plan: Possible Post-op intubation/ventilation  Informed Consent:      Only emergency history available  Plan Discussed with: CRNA  Anesthesia Plan Comments:        Anesthesia Quick Evaluation

## 2022-07-17 NOTE — Progress Notes (Signed)
Patient transported on vent from PACU to 4N20. No complications. Vitals stable.

## 2022-07-17 NOTE — Progress Notes (Signed)
E-link notified patient's SBP still above goal. Cleviprex maxed. PRN hydralazine given. PRN fentanyl given and propofol infusing for agitation. Awaiting any new orders.

## 2022-07-17 NOTE — Procedures (Signed)
INTERVENTIONAL NEURORADIOLOGY BRIEF POSTPROCEDURE NOTE  DIAGNOSTIC CEREBRAL ANGIOGRAM AND MECHANICAL THROMBECTOMY  Attending: Dr. Pedro Earls  Diagnosis: Right ICA terminus occlusion  Access site: RCFA  Access closure: Perclose Prostyle  Anesthesia: General  Medication used: refer to anesthesia documentation.  Complications: None.  Estimated blood loss: 50-70 mL  Specimen: None.  Findings: Occlusion of the right ICA terminus at the level of a fetal right PCA. Mechanical thrombectomy performedd with direct contact aspiration achieving completer recanalization (TICI3). No hemorrhagic or thromboembolic complication.  The patient tolerated the procedure well without incident or complication and is in stable condition.    PLAN: - bed rest x 6 hours post femoral artery access - patient remained intubated due to concern for aspiration as pt vomited during induction - SBP 120-140 mmHg

## 2022-07-17 NOTE — H&P (Incomplete)
Admission H&P    Chief Complaint: Acute onset of left sided weakness.   HPI: Douglas Cox is an 71 y.o. male with a PMHx of cataracts, celiac disease and retinal detachment who presents to the ED with acute onset of left hemiplegia, right gaze deviation and left hemineglect. He was at home working in his garage when he suddenly fell over into the wall and then "lowered myself to the ground". LKN 1130. He states that he could not get up so he called EMS. On EMS arrival, he had the above deficits. He was brought to the ED as a Code Stroke and at that time the same deficits continued to be manifest. He did complain of a 7/10 right frontal headache and did have abrasions along his forehead, but denied having had any sharp impact of his head on the floor of his garage. BP 147/73, HR 55. When asked several questions regarding his condition, he did not seem to be aware of his left sided weakness. He denies being on a blood thinner, ASA or Plavix. He is fully functioning at baseline with mRS 0.   Past Medical History:  Diagnosis Date   Celiac disease    Retinal detachment     Past Surgical History:  Procedure Laterality Date   CATARACT EXTRACTION Bilateral    Dr. Valetta Close   EYE SURGERY     FRACTURE SURGERY     GAS INSERTION Right 05/21/2021   Procedure: INSERTION OF GAS - C3F8;  Surgeon: Bernarda Caffey, MD;  Location: [place];  Service: Ophthalmology;  Laterality: Right;   GAS/FLUID EXCHANGE Right 05/21/2021   Procedure: GAS/FLUID EXCHANGE;  Surgeon: Bernarda Caffey, MD;  Location: [place];  Service: Ophthalmology;  Laterality: Right;   HERNIA REPAIR     PARS PLANA VITRECTOMY Right 05/21/2021   Procedure: PARS PLANA VITRECTOMY WITH 25 GAUGE;  Surgeon: Bernarda Caffey, MD;  Location: [place];  Service: Ophthalmology;  Laterality: Right;   PERFLUORONE INJECTION Right 05/21/2021   Procedure: PERFLUORONE INJECTION;  Surgeon: Bernarda Caffey, MD;  Location: [place];  Service: Ophthalmology;  Laterality: Right;    PHOTOCOAGULATION WITH LASER Right 05/21/2021   Procedure: PHOTOCOAGULATION WITH LASER;  Surgeon: Bernarda Caffey, MD;  Location: [place];  Service: Ophthalmology;  Laterality: Right;   RETINAL DETACHMENT SURGERY      No family history on file. Social History:  reports that he quit smoking about 16 years ago. His smoking use included cigarettes. He has never used smokeless tobacco. He reports current alcohol use of about 4.0 - 5.0 standard drinks of alcohol per week. He reports current drug use. Frequency: 7.00 times per week. Drug: Marijuana.  Allergies:  Allergies  Allergen Reactions   Codone [Hydrocodone] Hives   Nitrous Oxide     Can cause blindness in right eye    Tylenol [Acetaminophen] Hives   Clarithromycin Anxiety   No current facility-administered medications on file prior to encounter.   Current Outpatient Medications on File Prior to Encounter  Medication Sig Dispense Refill   acidophilus (RISAQUAD) CAPS capsule Take 1 capsule by mouth daily. Align once daily     cholecalciferol (VITAMIN D3) 25 MCG (1000 UNIT) tablet Take 1,000 Units by mouth daily.     Cyanocobalamin 1000 MCG/ML KIT Inject 1 mL as directed. Every 2 weeks     prednisoLONE acetate (PRED FORTE) 1 % ophthalmic suspension Place 1 drop into the left eye 4 (four) times daily. (Patient not taking: Reported on 08/04/2021) 10 mL 0   psyllium (METAMUCIL)  58.6 % powder Take 1 packet by mouth daily.      ROS: In the context of left sided neglect and anosognosia, he denies symptoms other than those documented in the HPI.   Physical Examination: Weight 70.8 kg.  HEENT-  Normocephalic; abrasions to forehead are noted.  Lungs - Respirations unlabored Abdomen - Nondistended Extremities - No edema  Neurologic Examination: Ment: Awake and alert. Speech fluent with intact naming and comprehension. Dysarthria is noted. Able to follow all right sided commands. Oriented x 5. Dense left hemineglect and anosognosia.  CN: PERRL.  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 Able to read samples from the NIHSS card without difficulty. Forced gaze deviation to the right. Cannot cross midline to the left. No nystagmus. Insensate to left side of face. Left facial droop. Hearing intact to voice. Phonation intact. Head preferentially rotated to the right.  Motor: 5/5 RUE and RLE.  LUE 0/5 with no movement to pinch LLE 2/5 withdrawal to noxious stimuli Sensory: Intact to FT and temp to RUE and RLE. Insensate to pinch, FT and temp to LUE and LLE. Reflexes: 2+ bilateral brachioradialis and patellae Cerebellar: Normal FNF on the right. Unable to assess on the left.  Gait: Unable to assess.   NIHSS: 15  Results for orders placed or performed during the hospital encounter of 07/17/22 (from the past 48 hour(s))  CBG monitoring, ED     Status: Abnormal   Collection Time: 07/17/22 12:32 PM  Result Value Ref Range   Glucose-Capillary 112 (H) 70 - 99 mg/dL    Comment: Glucose reference range applies only to samples taken after fasting for at least 8 hours.  CBC     Status: Abnormal   Collection Time: 07/17/22 12:37 PM  Result Value Ref Range   WBC 4.0 4.0 - 10.5 K/uL   RBC 3.81 (L) 4.22 - 5.81 MIL/uL   Hemoglobin 12.8 (L) 13.0 - 17.0 g/dL   HCT 38.7 (L) 39.0 - 52.0 %   MCV 101.6 (H) 80.0 - 100.0 fL   MCH 33.6 26.0 - 34.0 pg   MCHC 33.1 30.0 - 36.0 g/dL   RDW 12.5 11.5 - 15.5 %   Platelets 208 150 - 400 K/uL   nRBC 0.0 0.0 - 0.2 %    Comment: Performed at [facility], [place] [address] [place], [place] [address]  Differential     Status: None   Collection Time: 07/17/22 12:37 PM  Result Value Ref Range   Neutrophils Relative % 62 %   Neutro Abs 2.5 1.7 - 7.7 K/uL   Lymphocytes Relative 24 %   Lymphs Abs 1.0 0.7 - 4.0 K/uL   Monocytes Relative 10 %   Monocytes Absolute 0.4 0.1 - 1.0 K/uL   Eosinophils Relative 3 %   Eosinophils Absolute 0.1 0.0 - 0.5 K/uL   Basophils Relative 1 %   Basophils  Absolute 0.0 0.0 - 0.1 K/uL   Immature Granulocytes 0 %   Abs Immature Granulocytes 0.00 0.00 - 0.07 K/uL    Comment: Performed at [facility], [place] [address], [place], [place] [address]  I-stat chem 8, ED     Status: Abnormal   Collection Time: 07/17/22 12:38 PM  Result Value Ref Range   Sodium 140 135 - 145 mmol/L   Potassium 4.1 3.5 - 5.1 mmol/L   Chloride 106 98 - 111 mmol/L   BUN 17 8 - 23 mg/dL   Creatinine, Ser 1.10 0.61 -  1.24 mg/dL   Glucose, Bld 109 (H) 70 - 99 mg/dL    Comment: Glucose reference range applies only to samples taken after fasting for at least 8 hours.   Calcium, Ion 1.05 (L) 1.15 - 1.40 mmol/L   TCO2 24 22 - 32 mmol/L   Hemoglobin 13.6 13.0 - 17.0 g/dL   HCT 40.0 39.0 - 52.0 %   CT HEAD CODE STROKE WO CONTRAST  Result Date: 07/17/2022 CLINICAL DATA:  Code stroke. Neuro deficit, acute, stroke suspected. EXAM: CT HEAD WITHOUT CONTRAST TECHNIQUE: Contiguous axial images were obtained from the base of the skull through the vertex without intravenous contrast. RADIATION DOSE REDUCTION: This exam was performed according to the departmental dose-optimization program which includes automated exposure control, adjustment of the mA and/or kV according to patient size and/or use of iterative reconstruction technique. COMPARISON:  None Available. FINDINGS: Brain: Loss of gray-white differentiation noted in the right insular cortex and lateral aspect of the right lentiform nucleus. Right caudate head is intact. There is some indistinct no CIS of the posterior parietal cortex at the level of the basal ganglia. No super ganglionic cortical abnormalities are present. Loss of gray-white differentiation noted within the right temporal tip. No acute hemorrhage is present. The ventricles are of normal size. No significant extraaxial fluid collection is present. Insert normal brainstem Vascular: A hyperdense right ICA terminus and proximal right M1 segment noted. Skull: Calvarium is  intact. No focal lytic or blastic lesions are present. No significant extracranial soft tissue lesion is present. Sinuses/Orbits: Mild mucosal thickening is present within anterior ethmoid air cells and inferior frontal sinuses bilaterally. No fluid levels are present. The paranasal sinuses and mastoid air cells are otherwise clear. Bilateral lens replacements are noted. Globes and orbits are otherwise unremarkable. Other: None ASPECTS ([place] Stroke Program Early CT Score) - Ganglionic level infarction (caudate, lentiform nuclei, internal capsule, insula, M1-M3 cortex): 4/7 - Supraganglionic infarction (M4-M6 cortex): 3/3 Total score (0-10 with 10 being normal): 7/10 IMPRESSION: 1. Loss of gray-white differentiation in the right insular cortex, lateral aspect of the right lentiform nucleus, and right temporal tip consistent with acute infarct. 2. No acute hemorrhage. 3. Hyperdense right ICA terminus and proximal right M1 segment consistent with acute thrombus. 4. Aspects is 7/10. The above was relayed via text pager to [facility] on 07/17/2022 at 12:51 . Electronically Signed   By: San Morelle M.D.   On: 07/17/2022 12:51     Assessment: 71 y.o. male with a PMHx of cataracts, celiac disease and retinal detachment who presents to the ED with acute onset of left hemiplegia, right gaze deviation and left hemineglect. He was at home working in his garage when he suddenly fell over into the wall and then "lowered myself to the ground". LKN 1130. He states that he could not get up so he called EMS. On EMS arrival, he had the above deficits. He was brought to the ED as a Code Stroke and at that time the same deficits continued to be manifest. He did complain of a 7/10 right frontal headache and did have abrasions along his forehead, but denied having had any sharp impact of his head on the floor of his garage. BP 147/73, HR 55. When asked several questions regarding his condition, he did not seem to be  aware of his left sided weakness. He denies being on a blood thinner, ASA or Plavix.  - Exam reveals findings referable to the right MCA territory, secondary to acute ischemic stroke.  NIHSS 15. - CT head: Loss of gray-white differentiation in the right insular cortex, lateral aspect of the right lentiform nucleus, and right temporal tip consistent with acute infarct. No acute hemorrhage. Hyperdense right ICA terminus and proximal right M1 segment consistent with acute thrombus. Aspects is 7/10.  - CTA of head and neck: Occlusion of the right internal carotid artery at the level of the posterior communicating artery.  Marked attenuation of a posterior right M2 branch which appears to fill, presumably from PL collateral vessels. Contrast is present in the right A1 segment which appears to be the dominant vessel to the level of the thrombus. A smaller left A1 segment is present. Both A2 segments fill normally. Mild distal small vessel disease without other significant proximal stenosis, aneurysm, or branch vessel occlusion within the Circle of Willis. Atherosclerotic changes at the right carotid bifurcation and proximal right ICA without significant stenosis relative to the more distal vessel. Aortic atherosclerosis - After comprehensive review of possible contraindications, he has no absolute contraindications to TNK administration.. - The patient is a TNK candidate. Discussed extensively the risks/benefits of TNK treatment vs. no treatment with Dr. Oswald Hillock, including risks of hemorrhage and death with TNK administration versus worse overall outcomes on average in patients within the IV thrombolytic time window who are not administered TNK. The patient's hemineglect and anosognosia precludes meaningful medical decision making on his part at this time. Family was not available to obtain consent despite attempts to reach by telephone. Overall benefits of TNK regarding long-term prognosis are felt to outweigh  risks. The patient fits criteria for emergent two-physician decision-making regarding emergency treatment for his acute ischemic stroke, which is potentially life-threatening due to the high risk for death or severe disability. Dr. Oswald Hillock and I reached consensus agreement to proceed with TNK.  - After administration of TNK, the patient's wife was reached by phone and she was able to provide informed consent for VIR. The patient is a VIR candidate. Risks/benefits of the procedure were discussed extensively with patient's wife, including approximately 50% chance of significant improvement relative to an approximate 10% chance of subarachnoid hemorrhage with possibility of significant worsening including death. The patient's wife expressed understanding and provided informed consent to proceed with VIR. All questions answered.   - Code IR paged.   Plan: 1. Admitting to Neuro ICU after VIR  2. Post-TNK and VIR order set to include frequent neuro checks and BP management.  3. No antiplatelet medications or anticoagulants for at least 24 hours following TNK.  4. DVT prophylaxis with SCDs.  5. Will need to be started on a statin.  6. Will need to start antiplatelet therapy if follow up CT at 24 hours is negative for hemorrhagic conversion. 7. TTE.  8. Telemetry monitoring 9. MRI brain  10. PT/OT/Speech.  11. NPO until passes swallow evaluation.  12. Fasting lipid panel, HgbA1c  85 minutes spent in the emergent neurological evaluation and management of this critically ill patient.   Electronically signed: Dr. Kerney Elbe 07/17/2022, 12:57 PM

## 2022-07-17 NOTE — Progress Notes (Signed)
eLink Physician-Brief Progress Note Patient Name: Douglas Cox DOB: 1950-08-05 MRN: [id number]   Date of Service  07/17/2022  HPI/Events of Note  Vented patient with high risk for self extubation needs Bil soft wrist restaints ordered. On Propofol, BSRN asking for PRN Fent IVP.  Post intubation, ABG has been ordered. Patient received TNK @ 12-1p, unable to get ordered ABG for 24 hr per protocol.  Patient seen intubated not breathing over the set rate of 21, MV 6 Most recent ABG post procedure 7.32/40/76  eICU Interventions  Will keep at current vent setting for now as last ABG with respiratory acidosis. No central to obtain VBG to confirm Fentanyl prn ordered and hopefully be able to titrate down propofol as BP borderline Bilateral wrist restraints ordered. Bedside rounding team to reassess when restraints can be appropriately removed Discussed with bedside RN     Intervention Category Intermediate Interventions: Other:  [name] 07/17/2022, 8:33 PM

## 2022-07-17 NOTE — Progress Notes (Signed)
eLink Physician-Brief Progress Note Patient Name: Douglas Cox DOB: [birth year] MRN: [id number]   Date of Service  07/17/2022  HPI/Events of Note  Pt HTN on Cleviprex at 8 (max) and Prop 50 (max) as well as using PRN Fentanyl. Current BP 170/64 (92) with no other HTN PRN, SBP goal 120-140 HR 60s  eICU Interventions  Ordered hydralazine 10 mg IV prn for SBP > 140     Intervention Category Intermediate Interventions: Hypertension - evaluation and management  Douglas Cox 07/17/2022, 10:59 PM

## 2022-07-17 NOTE — Progress Notes (Signed)
CTA done. Dr. Debbrah Alar aware.

## 2022-07-18 ENCOUNTER — Inpatient Hospital Stay (HOSPITAL_COMMUNITY): Payer: Medicare Other

## 2022-07-18 ENCOUNTER — Encounter (HOSPITAL_COMMUNITY): Payer: Self-pay | Admitting: Radiology

## 2022-07-18 DIAGNOSIS — J9602 Acute respiratory failure with hypercapnia: Secondary | ICD-10-CM

## 2022-07-18 DIAGNOSIS — I6389 Other cerebral infarction: Secondary | ICD-10-CM | POA: Diagnosis not present

## 2022-07-18 DIAGNOSIS — I63413 Cerebral infarction due to embolism of bilateral middle cerebral arteries: Secondary | ICD-10-CM

## 2022-07-18 LAB — RESP PANEL BY RT-PCR (RSV, FLU A&B, COVID)  RVPGX2
Influenza A by PCR: NEGATIVE
Influenza B by PCR: NEGATIVE
Resp Syncytial Virus by PCR: NEGATIVE
SARS Coronavirus 2 by RT PCR: POSITIVE — AB

## 2022-07-18 LAB — ECHOCARDIOGRAM COMPLETE
Area-P 1/2: 3 cm2
Single Plane A4C EF: 63.4 %
Weight: 2497.37 oz

## 2022-07-18 LAB — COMPREHENSIVE METABOLIC PANEL WITH GFR
ALT: 20 U/L (ref 0–44)
AST: 27 U/L (ref 15–41)
Albumin: 3.1 g/dL — ABNORMAL LOW (ref 3.5–5.0)
Alkaline Phosphatase: 51 U/L (ref 38–126)
Anion gap: 11 (ref 5–15)
BUN: 10 mg/dL (ref 8–23)
CO2: 17 mmol/L — ABNORMAL LOW (ref 22–32)
Calcium: 7.8 mg/dL — ABNORMAL LOW (ref 8.9–10.3)
Chloride: 108 mmol/L (ref 98–111)
Creatinine, Ser: 0.96 mg/dL (ref 0.61–1.24)
GFR, Estimated: >60 mL/min
Glucose, Bld: 128 mg/dL — ABNORMAL HIGH (ref 70–99)
Potassium: 3.6 mmol/L (ref 3.5–5.1)
Sodium: 136 mmol/L (ref 135–145)
Total Bilirubin: 0.7 mg/dL (ref 0.3–1.2)
Total Protein: 5.9 g/dL — ABNORMAL LOW (ref 6.5–8.1)

## 2022-07-18 LAB — MAGNESIUM
Magnesium: 1.9 mg/dL (ref 1.7–2.4)
Magnesium: 1.9 mg/dL (ref 1.7–2.4)

## 2022-07-18 LAB — GLUCOSE, CAPILLARY: Glucose-Capillary: 99 mg/dL (ref 70–99)

## 2022-07-18 LAB — CBC
HCT: 38.1 % — ABNORMAL LOW (ref 39.0–52.0)
Hemoglobin: 13.1 g/dL (ref 13.0–17.0)
MCH: 34 pg (ref 26.0–34.0)
MCHC: 34.4 g/dL (ref 30.0–36.0)
MCV: 99 fL (ref 80.0–100.0)
Platelets: 200 10*3/uL (ref 150–400)
RBC: 3.85 MIL/uL — ABNORMAL LOW (ref 4.22–5.81)
RDW: 12.5 % (ref 11.5–15.5)
WBC: 8.4 10*3/uL (ref 4.0–10.5)
nRBC: 0 % (ref 0.0–0.2)

## 2022-07-18 LAB — LIPID PANEL
Cholesterol: 176 mg/dL (ref 0–200)
HDL: 44 mg/dL
LDL Cholesterol: 84 mg/dL (ref 0–99)
Total CHOL/HDL Ratio: 4 ratio
Triglycerides: 241 mg/dL — ABNORMAL HIGH
VLDL: 48 mg/dL — ABNORMAL HIGH (ref 0–40)

## 2022-07-18 LAB — PHOSPHORUS
Phosphorus: 2.6 mg/dL (ref 2.5–4.6)
Phosphorus: 3.3 mg/dL (ref 2.5–4.6)

## 2022-07-18 LAB — TRIGLYCERIDES: Triglycerides: 246 mg/dL — ABNORMAL HIGH (ref <150)

## 2022-07-18 MED ORDER — ASPIRIN 81 MG PO CHEW: 81.0000 mg | CHEWABLE_TABLET | Freq: Every day | ORAL | Status: DC

## 2022-07-18 MED ORDER — CHLORHEXIDINE GLUCONATE 0.12 % MT SOLN
15.0000 mL | Freq: Once | OROMUCOSAL | Status: AC
  Administered 2022-07-18: 15 mL via OROMUCOSAL

## 2022-07-18 MED ORDER — ORAL CARE MOUTH RINSE: 15.0000 mL | OROMUCOSAL | Status: DC | PRN

## 2022-07-18 MED ORDER — CHLORHEXIDINE GLUCONATE CLOTH 2 % EX PADS
6.0000 | MEDICATED_PAD | Freq: Every day | CUTANEOUS | Status: DC
  Administered 2022-07-18 – 2022-07-20 (×3): 6 via TOPICAL

## 2022-07-18 MED ORDER — SENNOSIDES-DOCUSATE SODIUM 8.6-50 MG PO TABS
1.0000 | ORAL_TABLET | Freq: Two times a day (BID) | ORAL | Status: DC
  Administered 2022-07-18 – 2022-07-22 (×7): 1
  Filled 2022-07-18 (×8): qty 1

## 2022-07-18 MED ORDER — ASPIRIN 81 MG PO TBEC: 81.0000 mg | DELAYED_RELEASE_TABLET | Freq: Every day | ORAL | Status: DC

## 2022-07-18 MED ORDER — CHLORHEXIDINE GLUCONATE CLOTH 2 % EX PADS
6.0000 | MEDICATED_PAD | Freq: Every day | CUTANEOUS | Status: DC
  Administered 2022-07-18: 6 via TOPICAL

## 2022-07-18 MED ORDER — LABETALOL HCL 5 MG/ML IV SOLN
10.0000 mg | INTRAVENOUS | Status: DC | PRN
  Administered 2022-07-18: 10 mg via INTRAVENOUS
  Filled 2022-07-18 (×2): qty 4

## 2022-07-18 MED ORDER — PROSOURCE TF20 ENFIT COMPATIBL EN LIQD
60.0000 mL | Freq: Every day | ENTERAL | Status: DC
  Filled 2022-07-18: qty 60

## 2022-07-18 MED ORDER — BACITRACIN ZINC 500 UNIT/GM EX OINT
TOPICAL_OINTMENT | CUTANEOUS | Status: AC
  Administered 2022-07-18: 1
  Filled 2022-07-18: qty 0.9

## 2022-07-18 MED ORDER — MUPIROCIN CALCIUM 2 % EX CREA
TOPICAL_CREAM | Freq: Two times a day (BID) | CUTANEOUS | Status: DC
  Filled 2022-07-18 (×2): qty 15

## 2022-07-18 MED ORDER — POTASSIUM CHLORIDE 20 MEQ PO PACK
40.0000 meq | PACK | Freq: Once | ORAL | Status: AC
  Administered 2022-07-18: 40 meq
  Filled 2022-07-18: qty 2

## 2022-07-18 MED ORDER — VITAL HIGH PROTEIN PO LIQD: 1000.0000 mL | ORAL | Status: DC

## 2022-07-18 MED ORDER — ASPIRIN 300 MG RE SUPP
300.0000 mg | Freq: Every day | RECTAL | Status: DC
  Administered 2022-07-18: 300 mg via RECTAL
  Filled 2022-07-18: qty 1

## 2022-07-18 NOTE — Procedures (Signed)
Extubation Procedure Note  Patient Details:   Name: Douglas Cox DOB: 1951/01/31 MRN: [id number]   Airway Documentation:    Vent end date: 07/18/22 Vent end time: 1115   Evaluation  O2 sats: stable throughout Complications: No apparent complications Patient did tolerate procedure well. Bilateral Breath Sounds: Clear, Diminished   No  Patient extubated per order to [place] 3L. No signs of respiratory distress noted and of evidence of stridor. Patient able to cough adequately. Patent responding appropriately but unable to speak at this time.  Levin Bacon 07/18/2022, 11:31 AM

## 2022-07-18 NOTE — Progress Notes (Signed)
PCCM:  I spoke with Dr. Leonie Man. The plan to wait on MRI. We will attempt weaning with mechanical vent support  Will placed in CPAP/PS for 56mns  If doing well with SAT SBT will plan to extubate this AM   BGarner Nash DO [facility] 07/18/2022 9:36 AM

## 2022-07-18 NOTE — Progress Notes (Signed)
eLink Physician-Brief Progress Note Patient Name: Douglas Cox DOB: 22-Feb-1951 MRN: [id number]   Date of Service  07/18/2022  HPI/Events of Note  Patient still HTN with SBP > 160 after receiving PRN Hydralazine, still on Fent and Prop  HR now in the 70s  eICU Interventions  Ordered labetalol 10 mg IV prn for SBP > 140. Hold for HR <60     Intervention Category Intermediate Interventions: Hypertension - evaluation and management  [name] 07/18/2022, 12:07 AM

## 2022-07-18 NOTE — Progress Notes (Signed)
  Transition of Care [facility]) Screening Note   Patient Details  Name: Douglas Cox Date of Birth: November 24, 1950   Transition of Care [facility]) CM/SW Contact:    Bartholomew Crews, RN Phone Number: [phone] 07/18/2022, 1:53 PM    Transition of Care Department [facility]) has reviewed patient and no TOC needs have been identified at this time. We will continue to monitor patient advancement through interdisciplinary progression rounds. If new patient transition needs arise, please place a TOC consult.

## 2022-07-18 NOTE — Progress Notes (Addendum)
eLink Physician-Brief Progress Note Patient Name: Demetrus Pavao DOB: Apr 14, 1951 MRN: [id number]   Date of Service  07/18/2022  HPI/Events of Note  Confirmed COVID positive Wife states he felt a bit run down and had tested positive at home but he was well enough to work in his garage before ictus CXR clear O2 100% on FiO2 60% and 5 PEEP and should able to titrate down further BP now more controlled, HR high 50s  eICU Interventions  No indication for COVID directed therapies at this time. Placed on contact and droplet precaution. Will need to be on VTE prophylaxis once cleared by neuro Conferred with bedside CCM team     Intervention Category Intermediate Interventions: Infection - evaluation and management  [name] 07/18/2022, 3:21 AM

## 2022-07-18 NOTE — Progress Notes (Signed)
  Echocardiogram 2D Echocardiogram has been performed.  Douglas Cox 07/18/2022, 2:20 PM

## 2022-07-18 NOTE — Evaluation (Signed)
Clinical/Bedside Swallow Evaluation Patient Details  Name: Nimai Burbach MRN: [id number] Date of Birth: [birth year]  Today's Date: 07/18/2022 Time: SLP Start Time (ACUTE ONLY): 1543 SLP Stop Time (ACUTE ONLY): 1556 SLP Time Calculation (min) (ACUTE ONLY): 13 min  Past Medical History:  Past Medical History:  Diagnosis Date   Celiac disease    Retinal detachment    Past Surgical History:  Past Surgical History:  Procedure Laterality Date   CATARACT EXTRACTION Bilateral    Dr. Valetta Close   EYE SURGERY     FRACTURE SURGERY     GAS INSERTION Right 05/21/2021   Procedure: INSERTION OF GAS - C3F8;  Surgeon: Bernarda Caffey, MD;  Location: [place];  Service: Ophthalmology;  Laterality: Right;   GAS/FLUID EXCHANGE Right 05/21/2021   Procedure: GAS/FLUID EXCHANGE;  Surgeon: Bernarda Caffey, MD;  Location: [place];  Service: Ophthalmology;  Laterality: Right;   HERNIA REPAIR     IR ANGIO INTRA EXTRACRAN SEL INTERNAL CAROTID UNI R MOD SED  07/17/2022   IR PERCUTANEOUS ART THROMBECTOMY/INFUSION INTRACRANIAL INC DIAG ANGIO  07/17/2022   IR US GUIDE VASC ACCESS RIGHT  07/17/2022   PARS PLANA VITRECTOMY Right 05/21/2021   Procedure: PARS PLANA VITRECTOMY WITH 25 GAUGE;  Surgeon: Bernarda Caffey, MD;  Location: [place];  Service: Ophthalmology;  Laterality: Right;   PERFLUORONE INJECTION Right 05/21/2021   Procedure: PERFLUORONE INJECTION;  Surgeon: Bernarda Caffey, MD;  Location: [place];  Service: Ophthalmology;  Laterality: Right;   PHOTOCOAGULATION WITH LASER Right 05/21/2021   Procedure: PHOTOCOAGULATION WITH LASER;  Surgeon: Bernarda Caffey, MD;  Location: [place];  Service: Ophthalmology;  Laterality: Right;   RADIOLOGY WITH ANESTHESIA N/A 07/17/2022   Procedure: IR WITH ANESTHESIA;  Surgeon: Radiologist, Medication, MD;  Location: [place];  Service: Radiology;  Laterality: N/A;   RETINAL DETACHMENT SURGERY     HPI:  Mr. Jathan Balling is a 71 y.o. male who presented to the ED with acute onset of left  hemiplegia, right gaze deviation and left hemineglect while working in his garage.  TNK given and recanalization acheived with thrombectomy.  Later at 1800 noted right sided weakness and new CT Head and CTA head and neck reordered and negative for reocclusion. MRI today shows bilateral basal ganglia infarcts. ASA 28m started 12/17  Pt with ETT for thrombectomy and overnight.  Extubated 12/17    Assessment / Plan / Recommendation  Clinical Impression  Pt presents with clinical indicators of pharyngeal dysphagia.  Pt exhibited prompt oral manipulation with ice chips and no clinical s/s of aspiration.  Pt tolerated small amount of water by teaspoon with no clinical s/s of aspiration. Pt with immediate wet cough with 1 of 2 trials of thin liquid by straw.  With cup sips of nectar thick liquid pt required multiple swallows.  Pt exhibited good oral clearance of puree.  There was L sided lingual residue and some buccal residue with regular solid graham cracker.  Additionally location of infacts (bilateral basal ganglia) placed pt at increased risk for silent aspiration.  Recommend instrumental assessment prior to initiation of PO diet.  Pt is incidentally COVID+ but would require last slot of day for MBSS tomorrow.  Recommend FEES instead tomorrow morning, so that we may proceed with cortrak placement if indicated after objective swallow evaluation.    Recommend pt remain NPO at present.  Pt may have priority oral meds crushed in puree. Pt may have ice chips and small amounts of water by spoon in moderation, after good oral care,  when fully awake/alert, with upright positioning and direct supervision.  SLP Visit Diagnosis: Dysphagia, unspecified (R13.10)    Aspiration Risk  Moderate aspiration risk    Diet Recommendation Ice chips PRN after oral care;NPO;NPO except meds   Medication Administration: Crushed with puree    Other  Recommendations      Recommendations for follow up therapy are one component  of a multi-disciplinary discharge planning process, led by the attending physician.  Recommendations may be updated based on patient status, additional functional criteria and insurance authorization.  Follow up Recommendations  (TBD)      Assistance Recommended at Discharge    Functional Status Assessment  (TBD)  Frequency and Duration  (TBD)          Prognosis Prognosis for Safe Diet Advancement:  (TBD)      Swallow Study   General Date of Onset: 07/17/22 HPI: Mr. Alize Acy is a 71 y.o. male who presented to the ED with acute onset of left hemiplegia, right gaze deviation and left hemineglect while working in his garage.  TNK given and recanalization acheived with thrombectomy.  Later at 1800 noted right sided weakness and new CT Head and CTA head and neck reordered and negative for reocclusion. MRI today shows bilateral basal ganglia infarcts. ASA 7m started 12/17  Pt with ETT for thrombectomy and overnight.  Extubated 12/17 Type of Study: Bedside Swallow Evaluation Diet Prior to this Study: NPO Temperature Spikes Noted: No History of Recent Intubation: Yes Length of Intubations (days): 1 days (under 24 hours) Date extubated: 07/18/22 Behavior/Cognition: Cooperative Oral Cavity Assessment: Within Functional Limits Oral Cavity - Dentition: Adequate natural dentition Baseline Vocal Quality: Not observed Volitional Swallow: Unable to elicit    Oral/Motor/Sensory Function Overall Oral Motor/Sensory Function: Other (comment) (Could not test)   Ice Chips Ice chips: Within functional limits Presentation: Spoon   Thin Liquid Thin Liquid: Impaired Presentation: Straw Pharyngeal  Phase Impairments: Cough - Immediate;Multiple swallows    Nectar Thick Nectar Thick Liquid: Impaired Presentation: Cup Pharyngeal Phase Impairments: Multiple swallows   Honey Thick Honey Thick Liquid: Not tested   Puree Puree: Within functional limits Presentation: Spoon   Solid     Solid:  Impaired Oral Phase Functional Implications: Oral residue;Left lateral sulci pocketing      LCeledonio Savage MA, CQuaker CityOffice: [phone]/17/2023,4:12 PM

## 2022-07-18 NOTE — Progress Notes (Signed)
Referring Physician(s): Lindzen,E  Supervising Physician: Pedro Earls  Patient Status:  [facility] - In-pt  Chief Complaint: Left sided weakness, dysarthria, acute right ICA/M1 thrombus with CVA   Subjective: Pt intubated, on Cleviprex, fentanyl, spontaneously moving all 4's, following some commands, pupils equal reactive, CCM to attempt weaning   Allergies: Codone [hydrocodone], Nitrous oxide, Tylenol [acetaminophen], and Clarithromycin  Medications: Prior to Admission medications   Medication Sig Start Date End Date Taking? Authorizing Provider  acidophilus (RISAQUAD) CAPS capsule Take 1 capsule by mouth daily. Align once daily    [provider]  cholecalciferol (VITAMIN D3) 25 MCG (1000 UNIT) tablet Take 1,000 Units by mouth daily.    [provider]  Cyanocobalamin 1000 MCG/ML KIT Inject 1 mL as directed. Every 2 weeks    [provider]  prednisoLONE acetate (PRED FORTE) 1 % ophthalmic suspension Place 1 drop into the left eye 4 (four) times daily. Patient not taking: Reported on 08/04/2021 06/05/21   Bernarda Caffey, MD  psyllium (METAMUCIL) 58.6 % powder Take 1 packet by mouth daily.    [provider]     Vital Signs: BP 129/73   Pulse 69   Temp 98.3 F (36.8 C) (Axillary)   Resp (!) 21   Wt 156 lb 1.4 oz (70.8 kg)   SpO2 100%   BMI 22.40 kg/m   Physical Exam patient intubated, spontaneously moving all fours, pupils equal and reactive, following some commands, puncture site right common femoral artery soft, clean, dry, no distinct hematoma, intact distal pulses.  Imaging: DG Abd 1 View  Result Date: 07/18/2022 CLINICAL DATA:  Tube placement EXAM: ABDOMEN - 1 VIEW COMPARISON:  None Available. FINDINGS: Upper abdomen image demonstrates a NG tube superimposed with the left upper quadrant. Only the upper portion of the abdomen was included. IMPRESSION: NG tube in place. Electronically Signed   By: Sammie Bench  M.D.   On: 07/18/2022 09:28   CT ANGIO HEAD NECK W WO CM  Result Date: 07/17/2022 CLINICAL DATA:  Patient initially presented with left hemiplegia. Following revascularization of the right ICA, the patient now has left hemiplegia. EXAM: CT ANGIOGRAPHY HEAD AND NECK TECHNIQUE: Multidetector CT imaging of the head and neck was performed using the standard protocol during bolus administration of intravenous contrast. Multiplanar CT image reconstructions and MIPs were obtained to evaluate the vascular anatomy. Carotid stenosis measurements (when applicable) are obtained utilizing NASCET criteria, using the distal internal carotid diameter as the denominator. RADIATION DOSE REDUCTION: This exam was performed according to the departmental dose-optimization program which includes automated exposure control, adjustment of the mA and/or kV according to patient size and/or use of iterative reconstruction technique. CONTRAST:  37m OMNIPAQUE IOHEXOL 350 MG/ML SOLN COMPARISON:  Cerebral arteriogram and CTA head and neck of the same day. FINDINGS: CTA NECK FINDINGS Aortic arch: Atherosclerotic changes at the left subclavian artery origin are stable. Right carotid system: Right common carotid artery is again within normal limits. Atherosclerotic calcifications at the right carotid bifurcation and proximal ICA are stable without significant stenosis or change. Left carotid system: Left common carotid artery is within normal limits. Bifurcation is unremarkable. Cervical left ICA is normal. Vertebral arteries: Left vertebral artery is dominant. No significant interval change. Skeleton: Stable appearance of the cervical spine. Other neck: Soft tissues the neck are otherwise unremarkable. Salivary glands are within normal limits. Thyroid is normal. No significant adenopathy is present. No focal mucosal or submucosal lesions are present. Upper chest: Emphysematous changes again  noted. No significant interval change. Review of the  MIP images confirms the above findings CTA HEAD FINDINGS Anterior circulation: The right internal carotid artery is reconstituted without focal stenosis or dissection through the ICA terminus. The left internal carotid artery is stable through the ICA terminus. The right M1 segment scratched at the right M1 and A1 segment are now patent. Right M1 segment scratched at the right A1 segment is dominant. Left M1 segment is normal. The MCA bifurcations are now within normal limits bilaterally. Left MCA branch vessels are stable. ACA branch vessels are stable. Right MCA branch vessels are now revascularized. No distal occlusion is present. Posterior circulation: Vertebral arteries and basilar artery normal. Left posterior cerebral artery originates from the basilar tip. Right posterior cerebral artery is of fetal type. Mild irregularity of distal PCA branch vessels bilaterally is stable. Venous sinuses: The dural sinuses are patent. The straight sinus and deep cerebral veins are intact. Cortical veins are within normal limits. No significant vascular malformation is evident. Anatomic variants: Fetal type right posterior cerebral artery. Review of the MIP images confirms the above findings IMPRESSION: 1. Right internal carotid artery is reconstituted without focal stenosis or dissection through the ICA terminus. 2. Right M1 segment and MCA bifurcations are now patent. 3. Right MCA branch vessels are now revascularized. 4. No new vessel occlusion involving the left anterior circulation. 5. Stable mild distal small vessel disease without significant proximal stenosis, aneurysm, or branch vessel occlusion within the Circle of Willis. 6. Stable atherosclerotic changes at the right carotid bifurcation and proximal ICA without significant stenosis or change. 7.  Aortic Atherosclerosis (ICD10-I70.0). Electronically Signed   By: San Morelle M.D.   On: 07/17/2022 19:25   IR PERCUTANEOUS ART THROMBECTOMY/INFUSION  INTRACRANIAL INC DIAG ANGIO  Result Date: 07/17/2022 INDICATION: 71 year old male presenting with left-sided weakness, dysarthria and; NIHSS 15. His past medical history significant for celiac disease retinal detachment and hemorrhoids. Head CT showed a hyperdense right ICA/MCA, hypodense right insular cortex and basal ganglia (ASPECTS 8). He received IV T NK. CT angiogram of the head and neck showed an occlusion of the intracranial right ICA at the level of the fetal right PCA extending into the proximal right A1 and along the entire M1/MCA segment. He was then transferred to our service for mechanical thrombectomy. EXAM: ULTRASOUND-GUIDED VASCULAR ACCESS DIAGNOSTIC CEREBRAL ANGIOGRAM MECHANICAL THROMBECTOMY FLAT PANEL HEAD CT COMPARISON:  CT/CT angiogram of the head and neck July 17, 2022. MEDICATIONS: 2 g of Ancef IV. The antibiotic was administered within 1 hour of the procedure ANESTHESIA/SEDATION: The procedure was performed under general anesthesia. CONTRAST:  40 mL of Omnipaque 300 milligram/mL FLUOROSCOPY: Radiation Exposure Index (as provided by the fluoroscopic device): 163 mGy Kerma COMPLICATIONS: None immediate. TECHNIQUE: Informed written consent was obtained from the patient's wife after a thorough discussion of the procedural risks, benefits and alternatives. All questions were addressed. Maximal Sterile Barrier Technique was utilized including caps, mask, sterile gowns, sterile gloves, sterile drape, hand hygiene and skin antiseptic. A timeout was performed prior to the initiation of the procedure. The right groin was prepped and draped in the usual sterile fashion. Using a micropuncture kit and the modified Seldinger technique, access was gained to the right common femoral artery and an 8 French sheath was placed. Real-time ultrasound guidance was utilized for vascular access including the acquisition of a permanent ultrasound image documenting patency of the accessed vessel. Under  fluoroscopy, a Zoom 88 guide catheter was navigated over a 6 [place] VTK catheter  and a 0.035" Terumo Glidewire into the aortic arch. The catheter was placed into the right common carotid artery and then advanced into the right internal carotid artery. The diagnostic catheter was removed. Frontal and lateral angiograms of the head were obtained. FINDINGS: 1. Normal caliber of the right common femoral artery, adequate for vascular access. 2. 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 PROCEDURE: Using biplane roadmap guidance, a zoom 71 aspiration catheter was navigated over Colossus 35 microguidewire into the cavernous segment of the right ICA. The aspiration catheter was then advanced to the level of occlusion and connected to an aspiration pump. Continuous aspiration was performed for 1.5 minutes. The guide catheter was connected to a VacLok syringe. The aspiration catheter was subsequently removed under constant aspiration. The guide catheter was aspirated for debris. Right internal carotid artery angiograms with frontal and lateral views of the head showed complete recanalization of the intracranial right ICA and right MCA vascular tree. And occlusion is noted at the distal right A1/ACA. Using biplane roadmap guidance, a zoom 71 aspiration catheter was navigated over Colossus 35 microguidewire into the cavernous segment of the right ICA. The aspiration catheter was then advanced to the level of occlusion in the distal right A1/ACA and connected to an aspiration pump. Continuous aspiration was performed for 2 minutes. The guide catheter was connected to a VacLok syringe. The aspiration catheter was subsequently removed under constant aspiration. The guide catheter was aspirated for debris. Right internal carotid angiograms with magnified frontal lateral views of the head showed complete recanalization of the right ACA vascular tree. Right internal carotid  artery angiograms with full view of the head in frontal and lateral projection show no evidence of thromboembolic complication. Flat panel CT of the head was obtained and post processed in a separate workstation with concurrent attending physician supervision. Selected images were sent to PACS. No evidence hemorrhagic complication. The catheter was subsequently withdrawn. Right common femoral artery angiogram was obtained in right anterior oblique view. The puncture is at the level of the common femoral artery. The artery has normal caliber, adequate for closure device. The sheath was exchanged over the wire for a Perclose prostyle which was utilized for access closure. Immediate hemostasis was achieved. IMPRESSION: 1. Successful mechanical thrombectomy for treatment of an intracranial right internal carotid artery occlusion achieving complete recanalization. 2. No thromboembolic or hemorrhagic complication. PLAN: Transfer to ICU for continued post stroke care. Electronically Signed   By: Pedro Earls M.D.   On: 07/17/2022 18:50   IR ANGIO INTRA EXTRACRAN SEL INTERNAL CAROTID UNI R MOD SED  Result Date: 07/17/2022 INDICATION: 71 year old male presenting with left-sided weakness, dysarthria and; NIHSS 15. His past medical history significant for celiac disease retinal detachment and hemorrhoids. Head CT showed a hyperdense right ICA/MCA, hypodense right insular cortex and basal ganglia (ASPECTS 8). He received IV T NK. CT angiogram of the head and neck showed an occlusion of the intracranial right ICA at the level of the fetal right PCA extending into the proximal right A1 and along the entire M1/MCA segment. He was then transferred to our service for mechanical thrombectomy. EXAM: ULTRASOUND-GUIDED VASCULAR ACCESS DIAGNOSTIC CEREBRAL ANGIOGRAM MECHANICAL THROMBECTOMY FLAT PANEL HEAD CT COMPARISON:  CT/CT angiogram of the head and neck July 17, 2022. MEDICATIONS: 2 g of Ancef IV. The  antibiotic was administered within 1 hour of the procedure ANESTHESIA/SEDATION: The procedure was performed under general anesthesia. CONTRAST:  40 mL of  Omnipaque 300 milligram/mL FLUOROSCOPY: Radiation Exposure Index (as provided by the fluoroscopic device): 761 mGy Kerma COMPLICATIONS: None immediate. TECHNIQUE: Informed written consent was obtained from the patient's wife after a thorough discussion of the procedural risks, benefits and alternatives. All questions were addressed. Maximal Sterile Barrier Technique was utilized including caps, mask, sterile gowns, sterile gloves, sterile drape, hand hygiene and skin antiseptic. A timeout was performed prior to the initiation of the procedure. The right groin was prepped and draped in the usual sterile fashion. Using a micropuncture kit and the modified Seldinger technique, access was gained to the right common femoral artery and an 8 French sheath was placed. Real-time ultrasound guidance was utilized for vascular access including the acquisition of a permanent ultrasound image documenting patency of the accessed vessel. Under fluoroscopy, a Zoom 88 guide catheter was navigated over a 6 [place] VTK catheter and a 0.035" Terumo Glidewire into the aortic arch. The catheter was placed into the right common carotid artery and then advanced into the right internal carotid artery. The diagnostic catheter was removed. Frontal and lateral angiograms of the head were obtained. FINDINGS: 1. Normal caliber of the right common femoral artery, adequate for vascular access. 2. 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 PROCEDURE: Using biplane roadmap guidance, a zoom 71 aspiration catheter was navigated over Colossus 35 microguidewire into the cavernous segment of the right ICA. The aspiration catheter was then advanced to the level of occlusion and connected to an aspiration pump. Continuous aspiration was performed for  1.5 minutes. The guide catheter was connected to a VacLok syringe. The aspiration catheter was subsequently removed under constant aspiration. The guide catheter was aspirated for debris. Right internal carotid artery angiograms with frontal and lateral views of the head showed complete recanalization of the intracranial right ICA and right MCA vascular tree. And occlusion is noted at the distal right A1/ACA. Using biplane roadmap guidance, a zoom 71 aspiration catheter was navigated over Colossus 35 microguidewire into the cavernous segment of the right ICA. The aspiration catheter was then advanced to the level of occlusion in the distal right A1/ACA and connected to an aspiration pump. Continuous aspiration was performed for 2 minutes. The guide catheter was connected to a VacLok syringe. The aspiration catheter was subsequently removed under constant aspiration. The guide catheter was aspirated for debris. Right internal carotid angiograms with magnified frontal lateral views of the head showed complete recanalization of the right ACA vascular tree. Right internal carotid artery angiograms with full view of the head in frontal and lateral projection show no evidence of thromboembolic complication. Flat panel CT of the head was obtained and post processed in a separate workstation with concurrent attending physician supervision. Selected images were sent to PACS. No evidence hemorrhagic complication. The catheter was subsequently withdrawn. Right common femoral artery angiogram was obtained in right anterior oblique view. The puncture is at the level of the common femoral artery. The artery has normal caliber, adequate for closure device. The sheath was exchanged over the wire for a Perclose prostyle which was utilized for access closure. Immediate hemostasis was achieved. IMPRESSION: 1. Successful mechanical thrombectomy for treatment of an intracranial right internal carotid artery occlusion achieving complete  recanalization. 2. No thromboembolic or hemorrhagic complication. PLAN: Transfer to ICU for continued post stroke care. Electronically Signed   By: Pedro Earls M.D.   On: 07/17/2022 18:50   IR US Guide Vasc Access Right  Result Date:  07/17/2022 INDICATION: 71 year old male presenting with left-sided weakness, dysarthria and; NIHSS 15. His past medical history significant for celiac disease retinal detachment and hemorrhoids. Head CT showed a hyperdense right ICA/MCA, hypodense right insular cortex and basal ganglia (ASPECTS 8). He received IV T NK. CT angiogram of the head and neck showed an occlusion of the intracranial right ICA at the level of the fetal right PCA extending into the proximal right A1 and along the entire M1/MCA segment. He was then transferred to our service for mechanical thrombectomy. EXAM: ULTRASOUND-GUIDED VASCULAR ACCESS DIAGNOSTIC CEREBRAL ANGIOGRAM MECHANICAL THROMBECTOMY FLAT PANEL HEAD CT COMPARISON:  CT/CT angiogram of the head and neck July 17, 2022. MEDICATIONS: 2 g of Ancef IV. The antibiotic was administered within 1 hour of the procedure ANESTHESIA/SEDATION: The procedure was performed under general anesthesia. CONTRAST:  40 mL of Omnipaque 300 milligram/mL FLUOROSCOPY: Radiation Exposure Index (as provided by the fluoroscopic device): 809 mGy Kerma COMPLICATIONS: None immediate. TECHNIQUE: Informed written consent was obtained from the patient's wife after a thorough discussion of the procedural risks, benefits and alternatives. All questions were addressed. Maximal Sterile Barrier Technique was utilized including caps, mask, sterile gowns, sterile gloves, sterile drape, hand hygiene and skin antiseptic. A timeout was performed prior to the initiation of the procedure. The right groin was prepped and draped in the usual sterile fashion. Using a micropuncture kit and the modified Seldinger technique, access was gained to the right common femoral artery and  an 8 French sheath was placed. Real-time ultrasound guidance was utilized for vascular access including the acquisition of a permanent ultrasound image documenting patency of the accessed vessel. Under fluoroscopy, a Zoom 88 guide catheter was navigated over a 6 [place] VTK catheter and a 0.035" Terumo Glidewire into the aortic arch. The catheter was placed into the right common carotid artery and then advanced into the right internal carotid artery. The diagnostic catheter was removed. Frontal and lateral angiograms of the head were obtained. FINDINGS: 1. Normal caliber of the right common femoral artery, adequate for vascular access. 2. 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 PROCEDURE: Using biplane roadmap guidance, a zoom 71 aspiration catheter was navigated over Colossus 35 microguidewire into the cavernous segment of the right ICA. The aspiration catheter was then advanced to the level of occlusion and connected to an aspiration pump. Continuous aspiration was performed for 1.5 minutes. The guide catheter was connected to a VacLok syringe. The aspiration catheter was subsequently removed under constant aspiration. The guide catheter was aspirated for debris. Right internal carotid artery angiograms with frontal and lateral views of the head showed complete recanalization of the intracranial right ICA and right MCA vascular tree. And occlusion is noted at the distal right A1/ACA. Using biplane roadmap guidance, a zoom 71 aspiration catheter was navigated over Colossus 35 microguidewire into the cavernous segment of the right ICA. The aspiration catheter was then advanced to the level of occlusion in the distal right A1/ACA and connected to an aspiration pump. Continuous aspiration was performed for 2 minutes. The guide catheter was connected to a VacLok syringe. The aspiration catheter was subsequently removed under constant aspiration. The guide  catheter was aspirated for debris. Right internal carotid angiograms with magnified frontal lateral views of the head showed complete recanalization of the right ACA vascular tree. Right internal carotid artery angiograms with full view of the head in frontal and lateral projection show no evidence of thromboembolic complication. Flat panel CT of  the head was obtained and post processed in a separate workstation with concurrent attending physician supervision. Selected images were sent to PACS. No evidence hemorrhagic complication. The catheter was subsequently withdrawn. Right common femoral artery angiogram was obtained in right anterior oblique view. The puncture is at the level of the common femoral artery. The artery has normal caliber, adequate for closure device. The sheath was exchanged over the wire for a Perclose prostyle which was utilized for access closure. Immediate hemostasis was achieved. IMPRESSION: 1. Successful mechanical thrombectomy for treatment of an intracranial right internal carotid artery occlusion achieving complete recanalization. 2. No thromboembolic or hemorrhagic complication. PLAN: Transfer to ICU for continued post stroke care. Electronically Signed   By: Pedro Earls M.D.   On: 07/17/2022 18:50   CT HEAD WO CONTRAST (5MM)  Result Date: 07/17/2022 CLINICAL DATA:  Status post revascularization of right ICA occlusion EXAM: CT HEAD WITHOUT CONTRAST TECHNIQUE: Contiguous axial images were obtained from the base of the skull through the vertex without intravenous contrast. RADIATION DOSE REDUCTION: This exam was performed according to the departmental dose-optimization program which includes automated exposure control, adjustment of the mA and/or kV according to patient size and/or use of iterative reconstruction technique. COMPARISON:  CT head without contrast 07/17/22 at 12:36 p.m. FINDINGS: Brain: No acute hemorrhage is present. The right caudate head is now  hypodense. Diffuse hypoattenuation is present within the right lentiform nucleus. Gray-white differentiation in the insular ribbon is improved. Gray-white differentiation at the right temporal tip is improved as well. This may have been artifactual in the past. No new infarcts are present. Mild right-sided edema is present with slight asymmetric effacement of the sulci. No extra-axial hemorrhage is present. Vascular: Hyperdense vessels are present bilaterally following contrast. This is still slightly more prominent on the right. Skull: Calvarium is intact. Right supraorbital scalp soft tissue swelling is present. No underlying fracture is present. Sinuses/Orbits: Mild mucosal thickening is present throughout the ethmoid air cells and frontoethmoidal recess bilaterally. Secretions are present in the right sphenoid sinus. Bilateral lens replacements are noted. Globes and orbits are otherwise unremarkable. IMPRESSION: 1. Evolving right MCA territory infarct appears to be limited to the right basal ganglia. 2. No acute hemorrhage. 3. No expansion of the infarct. 4. Right supraorbital scalp soft tissue swelling without underlying fracture. Electronically Signed   By: San Morelle M.D.   On: 07/17/2022 18:01   DG CHEST PORT 1 VIEW  Result Date: 07/17/2022 CLINICAL DATA:  Intubation. EXAM: PORTABLE CHEST 1 VIEW COMPARISON:  Chest radiograph 03/25/2021 FINDINGS: Endotracheal tube tip projects 6.6 cm above the carina. Heart is normal in size. No focal consolidation, pleural effusion, or pneumothorax. IMPRESSION: Endotracheal tube tip projects 6.6 cm above the carina. No acute cardiopulmonary abnormality. Electronically Signed   By: Ileana Roup M.D.   On: 07/17/2022 16:08   CT C-SPINE NO CHARGE  Result Date: 07/17/2022 CLINICAL DATA:  Fall EXAM: CT CERVICAL SPINE WITHOUT CONTRAST TECHNIQUE: Multidetector CT imaging of the cervical spine was performed without intravenous contrast. Multiplanar CT image  reconstructions were also generated. RADIATION DOSE REDUCTION: This exam was performed according to the departmental dose-optimization program which includes automated exposure control, adjustment of the mA and/or kV according to patient size and/or use of iterative reconstruction technique. COMPARISON:  None Available. FINDINGS: Alignment: Facet joints are aligned without dislocation or traumatic listhesis. Dens and lateral masses are aligned. Skull base and vertebrae: No acute fracture. No primary bone lesion or focal pathologic process. Soft tissues  and spinal canal: No prevertebral fluid or swelling. No visible canal hematoma. Disc levels: Moderate degenerative disc disease from C4-5 through C7-T1. Upper chest: Mild emphysematous changes at the lung apices. Other: None. IMPRESSION: 1. No acute fracture or traumatic listhesis of the cervical spine. 2. Moderate degenerative disc disease from C4-5 through C7-T1. Electronically Signed   By: Davina Poke D.O.   On: 07/17/2022 13:31   CT ANGIO HEAD NECK W WO CM (CODE STROKE)  Result Date: 07/17/2022 CLINICAL DATA:  Code stroke. Left-sided weakness. Right MCA territory infarct. EXAM: CT ANGIOGRAPHY HEAD AND NECK TECHNIQUE: Multidetector CT imaging of the head and neck was performed using the standard protocol during bolus administration of intravenous contrast. Multiplanar CT image reconstructions and MIPs were obtained to evaluate the vascular anatomy. Carotid stenosis measurements (when applicable) are obtained utilizing NASCET criteria, using the distal internal carotid diameter as the denominator. RADIATION DOSE REDUCTION: This exam was performed according to the departmental dose-optimization program which includes automated exposure control, adjustment of the mA and/or kV according to patient size and/or use of iterative reconstruction technique. CONTRAST:  15m OMNIPAQUE IOHEXOL 350 MG/ML SOLN COMPARISON:  CT head without contrast 07/17/2022. FINDINGS:  CTA NECK FINDINGS Aortic arch: Distal arch demonstrates mild calcification. Calcifications present the origin left subclavian artery without significant stenosis. The right scratched at the innominate artery and left common carotid artery origins are not included on the study. Right carotid system: Right common carotid artery is within normal limits. Atherosclerotic calcifications are present at the bifurcation and proximal right ICA without a significant stenosis relative to the more distal vessel. Left carotid system: The left common carotid artery is within normal limits. Bifurcation is unremarkable. Cervical left ICA is normal. Vertebral arteries: The left vertebral artery is the dominant vessel. Both vertebral arteries originate from the subclavian arteries without significant stenosis. No significant stenosis is present in either vertebral artery in the neck. Skeleton: Typical degenerative changes are present in the cervical spine most evident C4-5, C5-6 and C6-7. No focal osseous lesions are present. Other neck: Soft tissues the neck are otherwise unremarkable. Salivary glands are within normal limits. Thyroid is normal. No significant adenopathy is present. No focal mucosal or submucosal lesions are present. Upper chest: Centrilobular and paraseptal emphysematous changes are present. No nodule or mass lesion is present. No significant airspace consolidation is present. Thoracic inlet is within normal limits. Review of the MIP images confirms the above findings CTA HEAD FINDINGS Anterior circulation: The left internal carotid artery is within normal limits the skull base to the ICA terminus. The right internal carotid artery is within normal limits in the petrous and cavernous segments. The artery is occluded at the level of the posterior communicating artery. The proximal scratched at the right M1 segment is occluded. Contrast is present in the right A1 segment which appears to be the dominant vessel to the  level of the thrombus. A smaller left A1 segment is present. Anterior communicating artery is present. Both A2 segments fill normally. There is marked attenuation of a posterior right M2 branch which appears to fill, presumably from PL collateral vessels. Other proximal MCA branches are not visualized. Left MCA branch vessels and bilateral ACA branch vessels are within normal limits proximally. There is some distal attenuation of branch vessels. Retrograde Posterior circulation: The left vertebral artery is dominant vessel. PICA origins are visualized and normal. Vertebrobasilar junction and basilar artery are patent. Basilar artery is small. Right posterior cerebral artery is of fetal type. Left posterior  cerebral artery originates from the basilar tip. PCA branch vessels are within normal limits proximally. There is some attenuation of distal PCA branch vessels bilaterally. Venous sinuses: The dural sinuses are patent. The straight sinus and deep cerebral veins are intact. Cortical veins are within normal limits. No significant vascular malformation is evident. Anatomic variants: Fetal type right posterior cerebral artery Review of the MIP images confirms the above findings IMPRESSION: 1. 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 2. Marked attenuation of a posterior right M2 branch which appears to fill, presumably from PL collateral vessels. 3. Contrast is present in the right A1 segment which appears to be the dominant vessel to the level of the thrombus. 4. A smaller left A1 segment is present. 5. Both A2 segments fill normally. 6. Mild distal small vessel disease without other significant proximal stenosis, aneurysm, or branch vessel occlusion within the Circle of Willis. 7. Atherosclerotic changes at the right carotid bifurcation and proximal right ICA without significant stenosis relative to the more distal vessel. 8. Fetal type right posterior  cerebral artery. 9. Aortic Atherosclerosis (ICD10-I70.0) and Emphysema (ICD10-J43.9). Electronically Signed   By: San Morelle M.D.   On: 07/17/2022 13:19   CT HEAD CODE STROKE WO CONTRAST  Result Date: 07/17/2022 CLINICAL DATA:  Code stroke. Neuro deficit, acute, stroke suspected. EXAM: CT HEAD WITHOUT CONTRAST TECHNIQUE: Contiguous axial images were obtained from the base of the skull through the vertex without intravenous contrast. RADIATION DOSE REDUCTION: This exam was performed according to the departmental dose-optimization program which includes automated exposure control, adjustment of the mA and/or kV according to patient size and/or use of iterative reconstruction technique. COMPARISON:  None Available. FINDINGS: Brain: Loss of gray-white differentiation noted in the right insular cortex and lateral aspect of the right lentiform nucleus. Right caudate head is intact. There is some indistinct no CIS of the posterior parietal cortex at the level of the basal ganglia. No super ganglionic cortical abnormalities are present. Loss of gray-white differentiation noted within the right temporal tip. No acute hemorrhage is present. The ventricles are of normal size. No significant extraaxial fluid collection is present. Insert normal brainstem Vascular: A hyperdense right ICA terminus and proximal right M1 segment noted. Skull: Calvarium is intact. No focal lytic or blastic lesions are present. No significant extracranial soft tissue lesion is present. Sinuses/Orbits: Mild mucosal thickening is present within anterior ethmoid air cells and inferior frontal sinuses bilaterally. No fluid levels are present. The paranasal sinuses and mastoid air cells are otherwise clear. Bilateral lens replacements are noted. Globes and orbits are otherwise unremarkable. Other: None ASPECTS ([place] Stroke Program Early CT Score) - Ganglionic level infarction (caudate, lentiform nuclei, internal capsule, insula, M1-M3  cortex): 4/7 - Supraganglionic infarction (M4-M6 cortex): 3/3 Total score (0-10 with 10 being normal): 7/10 IMPRESSION: 1. Loss of gray-white differentiation in the right insular cortex, lateral aspect of the right lentiform nucleus, and right temporal tip consistent with acute infarct. 2. No acute hemorrhage. 3. Hyperdense right ICA terminus and proximal right M1 segment consistent with acute thrombus. 4. Aspects is 7/10. The above was relayed via text pager to [facility] on 07/17/2022 at 12:51 . Electronically Signed   By: San Morelle M.D.   On: 07/17/2022 12:51    Labs:  CBC: Recent Labs    07/17/22 1237 07/17/22 1238 07/17/22 1608 07/18/22 0335  WBC 4.0  --   --  8.4  HGB 12.8* 13.6 13.3 13.1  HCT 38.7* 40.0 39.0 38.1*  PLT 208  --   --  200    COAGS: Recent Labs    07/17/22 1237  INR 1.0  APTT 23*    BMP: Recent Labs    07/17/22 1237 07/17/22 1238 07/17/22 1608 07/18/22 0335  NA 139 140 137 136  K 4.2 4.1 4.5 3.6  CL 108 106  --  108  CO2 22  --   --  17*  GLUCOSE 110* 109*  --  128*  BUN 16 17  --  10  CALCIUM 7.9*  --   --  7.8*  CREATININE 1.00 1.10  --  0.96  GFRNONAA >60  --   --  >60    LIVER FUNCTION TESTS: Recent Labs    07/17/22 1237 07/18/22 0335  BILITOT 0.7 0.7  AST 22 27  ALT 17 20  ALKPHOS 48 51  PROT 5.8* 5.9*  ALBUMIN 3.1* 3.1*    Assessment and Plan: Patient with history of left-sided weakness, dysarthria, acute right ICA/M1 thrombus with CVA, encephalopathy, respiratory failure, mild COVID; status post mechanical thrombectomy 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 Rowe Robert, PA-C 07/18/2022, 10:09 AM   I spent a total of 15 Minutes at the the patient's bedside AND on the patient's  hospital floor or unit, greater than 50% of which was counseling/coordinating care for cerebral arteriogram with endovascular intervention    Patient ID: Douglas Cox, male   DOB: [birth year], 71 y.o.   MRN: [id number]

## 2022-07-18 NOTE — Progress Notes (Signed)
eLink Physician-Brief Progress Note Patient Name: Douglas Cox DOB: Dec 07, 1950 MRN: [id number]   Date of Service  07/18/2022  HPI/Events of Note  Notified of confused patient with high fall risk needs Posey belt ordered.  Seen confused and moving about in bed  eICU Interventions  Agree with high fall risk, soft waist belt ordered     Intervention Category Minor Interventions: Agitation / anxiety - evaluation and management  [name] 07/18/2022, 9:39 PM

## 2022-07-18 NOTE — Progress Notes (Signed)
OT Cancellation Note  Patient Details Name: Trino Higinbotham MRN: [id number] DOB: June 08, 1951   Cancelled Treatment:    Reason Eval/Treat Not Completed: Patient not medically ready. Pt intubated and heavily sedated with probable non extubation today per RN. Will re-check on pt tomorrow for appropriateness of eval.  [place] Circle, OTR/L [facility] [phone] Office [phone]    Almon Register 07/18/2022, 8:38 AM

## 2022-07-18 NOTE — Progress Notes (Signed)
Pt will be extubated soon was asked to wait until procedure is done to attempt echo per RN.

## 2022-07-18 NOTE — Progress Notes (Addendum)
STROKE TEAM PROGRESS NOTE   INTERVAL HISTORY His wife is at the bedside, she was not present when his symptoms started.  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  He was kept intubated as he was COVID-positive.  Yesterday evening around 7 PM he was found to not moving his left side and a stat CT scan was obtained which showed no post TNK bleed.  CT angiogram was also repeated which showed patent bilateral MCAs without any LVO .  This morning he is hemodynamically stable. Plan to extubate later today as well.  Moving all extremities purposefully, Starting to follow simple commands  MRI- bilateral basal ganglia infarcts , right temporal tip and right precentral cortex.  No hemorrhage Will likely need oral anticoagulation- concern for episode of atrial fibrillation in 2012 while in [place] as per wife but was not anticoagulated as it was an isolated episode- no records found in care everywhere   Vitals:   07/18/22 0648 07/18/22 0700 07/18/22 0715 07/18/22 0800  BP: (!) 140/71 132/68 (!) 133/96 128/65  Pulse:  64 63 60  Resp:  19 19 (!) 21  Temp:      TempSrc:      SpO2:  100% 100% 100%  Weight:       CBC:  Recent Labs  Lab 07/17/22 1237 07/17/22 1238 07/17/22 1608 07/18/22 0335  WBC 4.0  --   --  8.4  NEUTROABS 2.5  --   --   --   HGB 12.8*   < > 13.3 13.1  HCT 38.7*   < > 39.0 38.1*  MCV 101.6*  --   --  99.0  PLT 208  --   --  200   < > = values in this interval not displayed.   Basic Metabolic Panel:  Recent Labs  Lab 07/17/22 1237 07/17/22 1238 07/17/22 1608 07/18/22 0335  NA 139 140 137 136  K 4.2 4.1 4.5 3.6  CL 108 106  --  108  CO2 22  --   --  17*  GLUCOSE 110* 109*  --  128*  BUN 16 17  --  10  CREATININE 1.00 1.10  --  0.96  CALCIUM 7.9*  --   --  7.8*   Lipid Panel:  Recent Labs  Lab 07/18/22 0335  CHOL 176   TRIG 241*  246*  HDL 44  CHOLHDL 4.0  VLDL 48*  LDLCALC 84   HgbA1c: No results for input(s): "HGBA1C" in the last 168 hours. Urine Drug Screen: No results for input(s): "LABOPIA", "COCAINSCRNUR", "LABBENZ", "AMPHETMU", "THCU", "LABBARB" in the last 168 hours.  Alcohol Level  Recent Labs  Lab 07/17/22 1237  ETH <10    IMAGING past 24 hours CT ANGIO HEAD NECK W WO CM  Result Date: 07/17/2022 CLINICAL DATA:  Patient initially presented with left hemiplegia. Following revascularization of the right ICA, the patient now has left hemiplegia. EXAM: CT ANGIOGRAPHY HEAD AND NECK TECHNIQUE: Multidetector CT imaging of the head and neck was performed using the standard protocol during bolus administration of intravenous contrast. Multiplanar CT image reconstructions and MIPs were obtained to evaluate the vascular anatomy. Carotid stenosis measurements (when applicable) are obtained utilizing NASCET criteria, using the distal internal carotid diameter as the denominator. RADIATION DOSE REDUCTION: This exam was performed according to the departmental dose-optimization program which includes automated exposure control, adjustment of the  mA and/or kV according to patient size and/or use of iterative reconstruction technique. CONTRAST:  48m OMNIPAQUE IOHEXOL 350 MG/ML SOLN COMPARISON:  Cerebral arteriogram and CTA head and neck of the same day. FINDINGS: CTA NECK FINDINGS Aortic arch: Atherosclerotic changes at the left subclavian artery origin are stable. Right carotid system: Right common carotid artery is again within normal limits. Atherosclerotic calcifications at the right carotid bifurcation and proximal ICA are stable without significant stenosis or change. Left carotid system: Left common carotid artery is within normal limits. Bifurcation is unremarkable. Cervical left ICA is normal. Vertebral arteries: Left vertebral artery is dominant. No significant interval change. Skeleton: Stable appearance  of the cervical spine. Other neck: Soft tissues the neck are otherwise unremarkable. Salivary glands are within normal limits. Thyroid is normal. No significant adenopathy is present. No focal mucosal or submucosal lesions are present. Upper chest: Emphysematous changes again noted. No significant interval change. Review of the MIP images confirms the above findings CTA HEAD FINDINGS Anterior circulation: The right internal carotid artery is reconstituted without focal stenosis or dissection through the ICA terminus. The left internal carotid artery is stable through the ICA terminus. The right M1 segment scratched at the right M1 and A1 segment are now patent. Right M1 segment scratched at the right A1 segment is dominant. Left M1 segment is normal. The MCA bifurcations are now within normal limits bilaterally. Left MCA branch vessels are stable. ACA branch vessels are stable. Right MCA branch vessels are now revascularized. No distal occlusion is present. Posterior circulation: Vertebral arteries and basilar artery normal. Left posterior cerebral artery originates from the basilar tip. Right posterior cerebral artery is of fetal type. Mild irregularity of distal PCA branch vessels bilaterally is stable. Venous sinuses: The dural sinuses are patent. The straight sinus and deep cerebral veins are intact. Cortical veins are within normal limits. No significant vascular malformation is evident. Anatomic variants: Fetal type right posterior cerebral artery. Review of the MIP images confirms the above findings IMPRESSION: 1. Right internal carotid artery is reconstituted without focal stenosis or dissection through the ICA terminus. 2. Right M1 segment and MCA bifurcations are now patent. 3. Right MCA branch vessels are now revascularized. 4. No new vessel occlusion involving the left anterior circulation. 5. Stable mild distal small vessel disease without significant proximal stenosis, aneurysm, or branch vessel  occlusion within the Circle of Willis. 6. Stable atherosclerotic changes at the right carotid bifurcation and proximal ICA without significant stenosis or change. 7.  Aortic Atherosclerosis (ICD10-I70.0). Electronically Signed   By: CSan MorelleM.D.   On: 07/17/2022 19:25   IR PERCUTANEOUS ART THROMBECTOMY/INFUSION INTRACRANIAL INC DIAG ANGIO  Result Date: 07/17/2022 INDICATION: 71year old male presenting with left-sided weakness, dysarthria and; NIHSS 15. His past medical history significant for celiac disease retinal detachment and hemorrhoids. Head CT showed a hyperdense right ICA/MCA, hypodense right insular cortex and basal ganglia (ASPECTS 8). He received IV T NK. CT angiogram of the head and neck showed an occlusion of the intracranial right ICA at the level of the fetal right PCA extending into the proximal right A1 and along the entire M1/MCA segment. He was then transferred to our service for mechanical thrombectomy. EXAM: ULTRASOUND-GUIDED VASCULAR ACCESS DIAGNOSTIC CEREBRAL ANGIOGRAM MECHANICAL THROMBECTOMY FLAT PANEL HEAD CT COMPARISON:  CT/CT angiogram of the head and neck July 17, 2022. MEDICATIONS: 2 g of Ancef IV. The antibiotic was administered within 1 hour of the procedure ANESTHESIA/SEDATION: The procedure was performed under general anesthesia. CONTRAST:  40  mL of Omnipaque 300 milligram/mL FLUOROSCOPY: Radiation Exposure Index (as provided by the fluoroscopic device): 702 mGy Kerma COMPLICATIONS: None immediate. TECHNIQUE: Informed written consent was obtained from the patient's wife after a thorough discussion of the procedural risks, benefits and alternatives. All questions were addressed. Maximal Sterile Barrier Technique was utilized including caps, mask, sterile gowns, sterile gloves, sterile drape, hand hygiene and skin antiseptic. A timeout was performed prior to the initiation of the procedure. The right groin was prepped and draped in the usual sterile fashion.  Using a micropuncture kit and the modified Seldinger technique, access was gained to the right common femoral artery and an 8 French sheath was placed. Real-time ultrasound guidance was utilized for vascular access including the acquisition of a permanent ultrasound image documenting patency of the accessed vessel. Under fluoroscopy, a Zoom 88 guide catheter was navigated over a 6 [place] VTK catheter and a 0.035" Terumo Glidewire into the aortic arch. The catheter was placed into the right common carotid artery and then advanced into the right internal carotid artery. The diagnostic catheter was removed. Frontal and lateral angiograms of the head were obtained. FINDINGS: 1. Normal caliber of the right common femoral artery, adequate for vascular access. 2. 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 PROCEDURE: Using biplane roadmap guidance, a zoom 71 aspiration catheter was navigated over Colossus 35 microguidewire into the cavernous segment of the right ICA. The aspiration catheter was then advanced to the level of occlusion and connected to an aspiration pump. Continuous aspiration was performed for 1.5 minutes. The guide catheter was connected to a VacLok syringe. The aspiration catheter was subsequently removed under constant aspiration. The guide catheter was aspirated for debris. Right internal carotid artery angiograms with frontal and lateral views of the head showed complete recanalization of the intracranial right ICA and right MCA vascular tree. And occlusion is noted at the distal right A1/ACA. Using biplane roadmap guidance, a zoom 71 aspiration catheter was navigated over Colossus 35 microguidewire into the cavernous segment of the right ICA. The aspiration catheter was then advanced to the level of occlusion in the distal right A1/ACA and connected to an aspiration pump. Continuous aspiration was performed for 2 minutes. The guide catheter was  connected to a VacLok syringe. The aspiration catheter was subsequently removed under constant aspiration. The guide catheter was aspirated for debris. Right internal carotid angiograms with magnified frontal lateral views of the head showed complete recanalization of the right ACA vascular tree. Right internal carotid artery angiograms with full view of the head in frontal and lateral projection show no evidence of thromboembolic complication. Flat panel CT of the head was obtained and post processed in a separate workstation with concurrent attending physician supervision. Selected images were sent to PACS. No evidence hemorrhagic complication. The catheter was subsequently withdrawn. Right common femoral artery angiogram was obtained in right anterior oblique view. The puncture is at the level of the common femoral artery. The artery has normal caliber, adequate for closure device. The sheath was exchanged over the wire for a Perclose prostyle which was utilized for access closure. Immediate hemostasis was achieved. IMPRESSION: 1. Successful mechanical thrombectomy for treatment of an intracranial right internal carotid artery occlusion achieving complete recanalization. 2. No thromboembolic or hemorrhagic complication. PLAN: Transfer to ICU for continued post stroke care. Electronically Signed   By: Pedro Earls M.D.   On: 07/17/2022 18:50   IR ANGIO INTRA EXTRACRAN SEL INTERNAL CAROTID  UNI R MOD SED  Result Date: 07/17/2022 INDICATION: 71 year old male presenting with left-sided weakness, dysarthria and; NIHSS 15. His past medical history significant for celiac disease retinal detachment and hemorrhoids. Head CT showed a hyperdense right ICA/MCA, hypodense right insular cortex and basal ganglia (ASPECTS 8). He received IV T NK. CT angiogram of the head and neck showed an occlusion of the intracranial right ICA at the level of the fetal right PCA extending into the proximal right A1 and  along the entire M1/MCA segment. He was then transferred to our service for mechanical thrombectomy. EXAM: ULTRASOUND-GUIDED VASCULAR ACCESS DIAGNOSTIC CEREBRAL ANGIOGRAM MECHANICAL THROMBECTOMY FLAT PANEL HEAD CT COMPARISON:  CT/CT angiogram of the head and neck July 17, 2022. MEDICATIONS: 2 g of Ancef IV. The antibiotic was administered within 1 hour of the procedure ANESTHESIA/SEDATION: The procedure was performed under general anesthesia. CONTRAST:  40 mL of Omnipaque 300 milligram/mL FLUOROSCOPY: Radiation Exposure Index (as provided by the fluoroscopic device): 440 mGy Kerma COMPLICATIONS: None immediate. TECHNIQUE: Informed written consent was obtained from the patient's wife after a thorough discussion of the procedural risks, benefits and alternatives. All questions were addressed. Maximal Sterile Barrier Technique was utilized including caps, mask, sterile gowns, sterile gloves, sterile drape, hand hygiene and skin antiseptic. A timeout was performed prior to the initiation of the procedure. The right groin was prepped and draped in the usual sterile fashion. Using a micropuncture kit and the modified Seldinger technique, access was gained to the right common femoral artery and an 8 French sheath was placed. Real-time ultrasound guidance was utilized for vascular access including the acquisition of a permanent ultrasound image documenting patency of the accessed vessel. Under fluoroscopy, a Zoom 88 guide catheter was navigated over a 6 [place] VTK catheter and a 0.035" Terumo Glidewire into the aortic arch. The catheter was placed into the right common carotid artery and then advanced into the right internal carotid artery. The diagnostic catheter was removed. Frontal and lateral angiograms of the head were obtained. FINDINGS: 1. Normal caliber of the right common femoral artery, adequate for vascular access. 2. Occlusion of the intracranial right ICA at the level of the fetal right posterior cerebral  artery. PROCEDURE: Using biplane roadmap guidance, a zoom 71 aspiration catheter was navigated over Colossus 35 microguidewire into the cavernous segment of the right ICA. The aspiration catheter was then advanced to the level of occlusion and connected to an aspiration pump. Continuous aspiration was performed for 1.5 minutes. The guide catheter was connected to a VacLok syringe. The aspiration catheter was subsequently removed under constant aspiration. The guide catheter was aspirated for debris. Right internal carotid artery angiograms with frontal and lateral views of the head showed complete recanalization of the intracranial right ICA and right MCA vascular tree. And occlusion is noted at the distal right A1/ACA. Using biplane roadmap guidance, a zoom 71 aspiration catheter was navigated over Colossus 35 microguidewire into the cavernous segment of the right ICA. The aspiration catheter was then advanced to the level of occlusion in the distal right A1/ACA and connected to an aspiration pump. Continuous aspiration was performed for 2 minutes. The guide catheter was connected to a VacLok syringe. The aspiration catheter was subsequently removed under constant aspiration. The guide catheter was aspirated for debris. Right internal carotid angiograms with magnified frontal lateral views of the head showed complete recanalization of the right ACA vascular tree. Right internal carotid artery angiograms with full view of the head in frontal and lateral projection show no evidence  of thromboembolic complication. Flat panel CT of the head was obtained and post processed in a separate workstation with concurrent attending physician supervision. Selected images were sent to PACS. No evidence hemorrhagic complication. The catheter was subsequently withdrawn. Right common femoral artery angiogram was obtained in right anterior oblique view. The puncture is at the level of the common femoral artery. The artery has normal  caliber, adequate for closure device. The sheath was exchanged over the wire for a Perclose prostyle which was utilized for access closure. Immediate hemostasis was achieved. IMPRESSION: 1. Successful mechanical thrombectomy for treatment of an intracranial right internal carotid artery occlusion achieving complete recanalization. 2. No thromboembolic or hemorrhagic complication. PLAN: Transfer to ICU for continued post stroke care. Electronically Signed   By: Pedro Earls M.D.   On: 07/17/2022 18:50   IR US Guide Vasc Access Right  Result Date: 07/17/2022 INDICATION: 71 year old male presenting with left-sided weakness, dysarthria and; NIHSS 15. His past medical history significant for celiac disease retinal detachment and hemorrhoids. Head CT showed a hyperdense right ICA/MCA, hypodense right insular cortex and basal ganglia (ASPECTS 8). He received IV T NK. CT angiogram of the head and neck showed an occlusion of the intracranial right ICA at the level of the fetal right PCA extending into the proximal right A1 and along the entire M1/MCA segment. He was then transferred to our service for mechanical thrombectomy. EXAM: ULTRASOUND-GUIDED VASCULAR ACCESS DIAGNOSTIC CEREBRAL ANGIOGRAM MECHANICAL THROMBECTOMY FLAT PANEL HEAD CT COMPARISON:  CT/CT angiogram of the head and neck July 17, 2022. MEDICATIONS: 2 g of Ancef IV. The antibiotic was administered within 1 hour of the procedure ANESTHESIA/SEDATION: The procedure was performed under general anesthesia. CONTRAST:  40 mL of Omnipaque 300 milligram/mL FLUOROSCOPY: Radiation Exposure Index (as provided by the fluoroscopic device): 702 mGy Kerma COMPLICATIONS: None immediate. TECHNIQUE: Informed written consent was obtained from the patient's wife after a thorough discussion of the procedural risks, benefits and alternatives. All questions were addressed. Maximal Sterile Barrier Technique was utilized including caps, mask, sterile gowns,  sterile gloves, sterile drape, hand hygiene and skin antiseptic. A timeout was performed prior to the initiation of the procedure. The right groin was prepped and draped in the usual sterile fashion. Using a micropuncture kit and the modified Seldinger technique, access was gained to the right common femoral artery and an 8 French sheath was placed. Real-time ultrasound guidance was utilized for vascular access including the acquisition of a permanent ultrasound image documenting patency of the accessed vessel. Under fluoroscopy, a Zoom 88 guide catheter was navigated over a 6 [place] VTK catheter and a 0.035" Terumo Glidewire into the aortic arch. The catheter was placed into the right common carotid artery and then advanced into the right internal carotid artery. The diagnostic catheter was removed. Frontal and lateral angiograms of the head were obtained. FINDINGS: 1. Normal caliber of the right common femoral artery, adequate for vascular access. 2. 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 PROCEDURE: Using biplane roadmap guidance, a zoom 71 aspiration catheter was navigated over Colossus 35 microguidewire into the cavernous segment of the right ICA. The aspiration catheter was then advanced to the level of occlusion and connected to an aspiration pump. Continuous aspiration was performed for 1.5 minutes. The guide catheter was connected to a VacLok syringe. The aspiration catheter was subsequently removed under constant aspiration. The guide catheter was aspirated for debris. Right internal carotid artery angiograms with frontal and  lateral views of the head showed complete recanalization of the intracranial right ICA and right MCA vascular tree. And occlusion is noted at the distal right A1/ACA. Using biplane roadmap guidance, a zoom 71 aspiration catheter was navigated over Colossus 35 microguidewire into the cavernous segment of the right ICA. The  aspiration catheter was then advanced to the level of occlusion in the distal right A1/ACA and connected to an aspiration pump. Continuous aspiration was performed for 2 minutes. The guide catheter was connected to a VacLok syringe. The aspiration catheter was subsequently removed under constant aspiration. The guide catheter was aspirated for debris. Right internal carotid angiograms with magnified frontal lateral views of the head showed complete recanalization of the right ACA vascular tree. Right internal carotid artery angiograms with full view of the head in frontal and lateral projection show no evidence of thromboembolic complication. Flat panel CT of the head was obtained and post processed in a separate workstation with concurrent attending physician supervision. Selected images were sent to PACS. No evidence hemorrhagic complication. The catheter was subsequently withdrawn. Right common femoral artery angiogram was obtained in right anterior oblique view. The puncture is at the level of the common femoral artery. The artery has normal caliber, adequate for closure device. The sheath was exchanged over the wire for a Perclose prostyle which was utilized for access closure. Immediate hemostasis was achieved. IMPRESSION: 1. Successful mechanical thrombectomy for treatment of an intracranial right internal carotid artery occlusion achieving complete recanalization. 2. No thromboembolic or hemorrhagic complication. PLAN: Transfer to ICU for continued post stroke care. Electronically Signed   By: Pedro Earls M.D.   On: 07/17/2022 18:50   CT HEAD WO CONTRAST (5MM)  Result Date: 07/17/2022 CLINICAL DATA:  Status post revascularization of right ICA occlusion EXAM: CT HEAD WITHOUT CONTRAST TECHNIQUE: Contiguous axial images were obtained from the base of the skull through the vertex without intravenous contrast. RADIATION DOSE REDUCTION: This exam was performed according to the departmental  dose-optimization program which includes automated exposure control, adjustment of the mA and/or kV according to patient size and/or use of iterative reconstruction technique. COMPARISON:  CT head without contrast 07/17/22 at 12:36 p.m. FINDINGS: Brain: No acute hemorrhage is present. The right caudate head is now hypodense. Diffuse hypoattenuation is present within the right lentiform nucleus. Gray-white differentiation in the insular ribbon is improved. Gray-white differentiation at the right temporal tip is improved as well. This may have been artifactual in the past. No new infarcts are present. Mild right-sided edema is present with slight asymmetric effacement of the sulci. No extra-axial hemorrhage is present. Vascular: Hyperdense vessels are present bilaterally following contrast. This is still slightly more prominent on the right. Skull: Calvarium is intact. Right supraorbital scalp soft tissue swelling is present. No underlying fracture is present. Sinuses/Orbits: Mild mucosal thickening is present throughout the ethmoid air cells and frontoethmoidal recess bilaterally. Secretions are present in the right sphenoid sinus. Bilateral lens replacements are noted. Globes and orbits are otherwise unremarkable. IMPRESSION: 1. Evolving right MCA territory infarct appears to be limited to the right basal ganglia. 2. No acute hemorrhage. 3. No expansion of the infarct. 4. Right supraorbital scalp soft tissue swelling without underlying fracture. Electronically Signed   By: San Morelle M.D.   On: 07/17/2022 18:01   DG CHEST PORT 1 VIEW  Result Date: 07/17/2022 CLINICAL DATA:  Intubation. EXAM: PORTABLE CHEST 1 VIEW COMPARISON:  Chest radiograph 03/25/2021 FINDINGS: Endotracheal tube tip projects 6.6 cm above the carina. Heart  is normal in size. No focal consolidation, pleural effusion, or pneumothorax. IMPRESSION: Endotracheal tube tip projects 6.6 cm above the carina. No acute cardiopulmonary  abnormality. Electronically Signed   By: Ileana Roup M.D.   On: 07/17/2022 16:08   CT C-SPINE NO CHARGE  Result Date: 07/17/2022 CLINICAL DATA:  Fall EXAM: CT CERVICAL SPINE WITHOUT CONTRAST TECHNIQUE: Multidetector CT imaging of the cervical spine was performed without intravenous contrast. Multiplanar CT image reconstructions were also generated. RADIATION DOSE REDUCTION: This exam was performed according to the departmental dose-optimization program which includes automated exposure control, adjustment of the mA and/or kV according to patient size and/or use of iterative reconstruction technique. COMPARISON:  None Available. FINDINGS: Alignment: Facet joints are aligned without dislocation or traumatic listhesis. Dens and lateral masses are aligned. Skull base and vertebrae: No acute fracture. No primary bone lesion or focal pathologic process. Soft tissues and spinal canal: No prevertebral fluid or swelling. No visible canal hematoma. Disc levels: Moderate degenerative disc disease from C4-5 through C7-T1. Upper chest: Mild emphysematous changes at the lung apices. Other: None. IMPRESSION: 1. No acute fracture or traumatic listhesis of the cervical spine. 2. Moderate degenerative disc disease from C4-5 through C7-T1. Electronically Signed   By: Davina Poke D.O.   On: 07/17/2022 13:31   CT ANGIO HEAD NECK W WO CM (CODE STROKE)  Result Date: 07/17/2022 CLINICAL DATA:  Code stroke. Left-sided weakness. Right MCA territory infarct. EXAM: CT ANGIOGRAPHY HEAD AND NECK TECHNIQUE: Multidetector CT imaging of the head and neck was performed using the standard protocol during bolus administration of intravenous contrast. Multiplanar CT image reconstructions and MIPs were obtained to evaluate the vascular anatomy. Carotid stenosis measurements (when applicable) are obtained utilizing NASCET criteria, using the distal internal carotid diameter as the denominator. RADIATION DOSE REDUCTION: This exam was  performed according to the departmental dose-optimization program which includes automated exposure control, adjustment of the mA and/or kV according to patient size and/or use of iterative reconstruction technique. CONTRAST:  71m OMNIPAQUE IOHEXOL 350 MG/ML SOLN COMPARISON:  CT head without contrast 07/17/2022. FINDINGS: CTA NECK FINDINGS Aortic arch: Distal arch demonstrates mild calcification. Calcifications present the origin left subclavian artery without significant stenosis. The right scratched at the innominate artery and left common carotid artery origins are not included on the study. Right carotid system: Right common carotid artery is within normal limits. Atherosclerotic calcifications are present at the bifurcation and proximal right ICA without a significant stenosis relative to the more distal vessel. Left carotid system: The left common carotid artery is within normal limits. Bifurcation is unremarkable. Cervical left ICA is normal. Vertebral arteries: The left vertebral artery is the dominant vessel. Both vertebral arteries originate from the subclavian arteries without significant stenosis. No significant stenosis is present in either vertebral artery in the neck. Skeleton: Typical degenerative changes are present in the cervical spine most evident C4-5, C5-6 and C6-7. No focal osseous lesions are present. Other neck: Soft tissues the neck are otherwise unremarkable. Salivary glands are within normal limits. Thyroid is normal. No significant adenopathy is present. No focal mucosal or submucosal lesions are present. Upper chest: Centrilobular and paraseptal emphysematous changes are present. No nodule or mass lesion is present. No significant airspace consolidation is present. Thoracic inlet is within normal limits. Review of the MIP images confirms the above findings CTA HEAD FINDINGS Anterior circulation: The left internal carotid artery is within normal limits the skull base to the ICA  terminus. The right internal carotid artery is within normal  limits in the petrous and cavernous segments. The artery is occluded at the level of the posterior communicating artery. The proximal scratched at the right M1 segment is occluded. Contrast is present in the right A1 segment which appears to be the dominant vessel to the level of the thrombus. A smaller left A1 segment is present. Anterior communicating artery is present. Both A2 segments fill normally. There is marked attenuation of a posterior right M2 branch which appears to fill, presumably from PL collateral vessels. Other proximal MCA branches are not visualized. Left MCA branch vessels and bilateral ACA branch vessels are within normal limits proximally. There is some distal attenuation of branch vessels. Retrograde Posterior circulation: The left vertebral artery is dominant vessel. PICA origins are visualized and normal. Vertebrobasilar junction and basilar artery are patent. Basilar artery is small. Right posterior cerebral artery is of fetal type. Left posterior cerebral artery originates from the basilar tip. PCA branch vessels are within normal limits proximally. There is some attenuation of distal PCA branch vessels bilaterally. Venous sinuses: The dural sinuses are patent. The straight sinus and deep cerebral veins are intact. Cortical veins are within normal limits. No significant vascular malformation is evident. Anatomic variants: Fetal type right posterior cerebral artery Review of the MIP images confirms the above findings IMPRESSION: 1. 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 2. Marked attenuation of a posterior right M2 branch which appears to fill, presumably from PL collateral vessels. 3. Contrast is present in the right A1 segment which appears to be the dominant vessel to the level of the thrombus. 4. A smaller left A1 segment is present. 5. Both A2 segments fill normally.  6. Mild distal small vessel disease without other significant proximal stenosis, aneurysm, or branch vessel occlusion within the Circle of Willis. 7. Atherosclerotic changes at the right carotid bifurcation and proximal right ICA without significant stenosis relative to the more distal vessel. 8. Fetal type right posterior cerebral artery. 9. Aortic Atherosclerosis (ICD10-I70.0) and Emphysema (ICD10-J43.9). Electronically Signed   By: San Morelle M.D.   On: 07/17/2022 13:19   CT HEAD CODE STROKE WO CONTRAST  Result Date: 07/17/2022 CLINICAL DATA:  Code stroke. Neuro deficit, acute, stroke suspected. EXAM: CT HEAD WITHOUT CONTRAST TECHNIQUE: Contiguous axial images were obtained from the base of the skull through the vertex without intravenous contrast. RADIATION DOSE REDUCTION: This exam was performed according to the departmental dose-optimization program which includes automated exposure control, adjustment of the mA and/or kV according to patient size and/or use of iterative reconstruction technique. COMPARISON:  None Available. FINDINGS: Brain: Loss of gray-white differentiation noted in the right insular cortex and lateral aspect of the right lentiform nucleus. Right caudate head is intact. There is some indistinct no CIS of the posterior parietal cortex at the level of the basal ganglia. No super ganglionic cortical abnormalities are present. Loss of gray-white differentiation noted within the right temporal tip. No acute hemorrhage is present. The ventricles are of normal size. No significant extraaxial fluid collection is present. Insert normal brainstem Vascular: A hyperdense right ICA terminus and proximal right M1 segment noted. Skull: Calvarium is intact. No focal lytic or blastic lesions are present. No significant extracranial soft tissue lesion is present. Sinuses/Orbits: Mild mucosal thickening is present within anterior ethmoid air cells and inferior frontal sinuses bilaterally. No  fluid levels are present. The paranasal sinuses and mastoid air cells are otherwise clear. Bilateral lens replacements are noted. Globes and orbits  are otherwise unremarkable. Other: None ASPECTS ([place] Stroke Program Early CT Score) - Ganglionic level infarction (caudate, lentiform nuclei, internal capsule, insula, M1-M3 cortex): 4/7 - Supraganglionic infarction (M4-M6 cortex): 3/3 Total score (0-10 with 10 being normal): 7/10 IMPRESSION: 1. Loss of gray-white differentiation in the right insular cortex, lateral aspect of the right lentiform nucleus, and right temporal tip consistent with acute infarct. 2. No acute hemorrhage. 3. Hyperdense right ICA terminus and proximal right M1 segment consistent with acute thrombus. 4. Aspects is 7/10. The above was relayed via text pager to [facility] on 07/17/2022 at 12:51 . Electronically Signed   By: San Morelle M.D.   On: 07/17/2022 12:51    PHYSICAL EXAM  Physical Exam  Constitutional: Appears well-developed and well-nourished.   Cardiovascular: Normal rate and regular rhythm.  Respiratory: Effort normal, non-labored breathing  Neuro: Mental Status: Intubated, sedated (paused during exam) Starting to follow simple commands. Moving all extremities  Cranial Nerves: II: Visual Fields are full. Pupils are equal, round, and reactive to light.   III,IV, VI: eyes are midline VIII: responds to verbal stimulation X: cough and gag intact XI: head is midline Motor: Tone is normal. Bulk is normal. Purposeful movement/antigravity movement noted in all extremities Sensory: Localizes to painful stimuli in all extremities    ASSESSMENT/PLAN [name] is a 71 y.o. male with history of cataracts, celiac disease and retinal detachment who presents to the ED with acute onset of left hemiplegia, right gaze deviation and left hemineglect while working in his garage, he does have an abrasion on his forehead but denied hitting his head in  the garage.  TNK was given and he was emergently taken for a mechanical thrombectomy of right ICA terminus at the level of a fetal right PCA. Mechanical thrombectomy performedd with direct contact aspiration achieving completer recanalization (TICI3).  1800 noted right sided weakness and new CT Head and CTA head and neck reordered and negative for reocclusion. MRI today shows bilateral basal ganglia infarcts. ASA 64m started 12/17  Stroke:  Bilateral basal ganglia infarcts s/p TNK and MT of terminal right ICA occlusion with successful complete recanalization Etiology: Likely paroxysmal A-fib given history of it in the past  code Stroke CT head Hyperdense right ICA terminus and proximal right M1 segment consistent with acute thrombus ASPECTS 7    CTA head & neck Occlusion of the right internal carotid artery at the level of the posterior communicating artery. Marked attenuation of a posterior right M2 branch which appears to fill, presumably from PL collateral vessels.  Repeat CT Head Evolving right MCA territory infarct appears to be limited to the right basal ganglia. Repeat CTA Head and Neck- Right internal carotid artery is reconstituted without focal stenosis or dissection through the ICA terminus. Right M1 segment and MCA bifurcations are now patent. Right MCA branch vessels are now revascularized. MRI  Acute/subacute nonhemorrhagic infarct of the right caudate head and lentiform nucleus as noted on the previous CT scan. Acute/subacute nonhemorrhagic infarct of the left caudate head and lentiform nucleus spanning the internal capsule. Acute/subacute nonhemorrhagic infarct of the cortex of the right temporal tip. Single punctate focus of restricted diffusion in the right precentral cortex. 2D Echo  LDL 84 HgbA1c No results found for requested labs within last 1095 days. VTE prophylaxis -     Diet   Diet NPO time specified   No antithrombotic prior to admission, now on aspirin 300 mg  suppository daily. [place]  Use ASA 72m once able to take PO medications  Therapy recommendations:  Pending Disposition:  Pending   Hypertension Home meds:   None Stable BP Goal 120-140 for 24 hours post IR  Long-term BP goal normotensive  Hyperlipidemia LDL 84, goal < 70 Add statin when no longer NPO  Continue statin at discharge  Diabetes type II Controlled Home meds:  None HgbA1c No results found for requested labs within last 1095 days., goal < 7.0 CBGs Recent Labs    07/17/22 1232 07/17/22 1613 07/17/22 1948  GLUCAP 112* 144* 105*    SSI  Other Stroke Risk Factors Advanced Age >/= 659  Other Active Problems Respiratory failure COVID positive- symptoms started on Tuesday 12/12 Intubated for procedure Extubated 12/17  CT value of 27  Hospital day # 1  Patient seen and examined by NP/APP with MD. MD to update note as needed.   DJanine Ores DNP, FNP-BC [facility] Pager: [phone] STROKE MD NOTE :  Patient presented with sudden onset of left hemiplegia due to terminal right ICA and M1 occlusion and was treated with successful mechanical thrombectomy and IV TNK with complete recanalization and was kept intubated due to being COVID-positive.  Yesterday evening he was noted to have some right hemiplegia and hence underwent repeat CT and CT angio but was not found to have new LVO.  MRI scan however confirms bilateral MCA infarcts suggesting central cardiac source of embolism possibly paroxysmal A-fib given history of the same in the past in 2012.  Continue close neurological monitoring and strict blood pressure control as per post TNK and thrombectomy protocol.  Mobilize in bed.  Physical  and Occupational Therapy and speech therapy consults.  May consider anticoagulation in 3 to 5 days given large size of strokes and prior history of paroxysmal A-fib.  Long discussion with patient and his wife and answered questions.  Continue  management of COVID status as per critical care team.  Discussed with Dr. BLeory PlowmanIcard critical care medicine.This patient is critically ill and at significant risk of neurological worsening, death and care requires constant monitoring of vital signs, hemodynamics,respiratory and cardiac monitoring, extensive review of multiple databases, frequent neurological assessment, discussion with family, other specialists and medical decision making of high complexity.I have made any additions or clarifications directly to the above note.This critical care time does not reflect procedure time, or teaching time or supervisory time of PA/NP/Med Resident etc but could involve care discussion time.  I spent 40 minutes of neurocritical care time  in the care of  this patient.    PAntony Contras MD  To contact Stroke Continuity provider, please refer to [email or web address] After hours, contact General Neurology

## 2022-07-18 NOTE — Anesthesia Postprocedure Evaluation (Signed)
Anesthesia Post Note  Patient: Douglas Cox  Procedure(s) Performed: IR WITH ANESTHESIA     Patient location during evaluation: SICU Anesthesia Type: General Level of consciousness: sedated Pain management: pain level controlled Vital Signs Assessment: post-procedure vital signs reviewed and stable Respiratory status: patient remains intubated per anesthesia plan Cardiovascular status: stable Postop Assessment: no apparent nausea or vomiting Anesthetic complications: no   No notable events documented.  Last Vitals:  Vitals:   07/18/22 0700 07/18/22 0715  BP: 132/68 (!) 133/96  Pulse: 64 63  Resp: 19 19  Temp:    SpO2: 100% 100%    Last Pain:  Vitals:   07/18/22 0335  TempSrc: Axillary                 March Rummage Kimm Sider

## 2022-07-18 NOTE — Consult Note (Signed)
NAME:  Douglas Cox, MRN:  [id number], DOB:  1950/11/21, LOS: 1 ADMISSION DATE:  07/17/2022, CONSULTATION DATE:  07/17/22 REFERRING MD:  Dr. Ladean Raya, CHIEF COMPLAINT:  AMS   History of Present Illness:  71 y/o M who presented to [facility] ER on 12/16 with reports of AMS.    Patient presented to the ER via EMS after friend found him on the floor of the garage.  The patient reportedly slid onto the floor, no LOC.  He reported a headache.  His friend called EMS with noted left-sided weakness.  Last known well 11:30 AM.  He presented to the ER as a code stroke.  Initial CT of the head showed loss of gray-white differentiation in the right insular cortex, lateral aspect of the right lentiform nucleus and right temporal tip consistent with acute infarct.  Hyperdense right ICA terminus and proximal right M1 segment consistent with acute thrombus.  On exam he was documented to have a right gaze preference, left hemianopia, left facial droop, left arm weakness, left leg weakness, dysarthria and decreased sensation.  The patient was treated emergently with TNK (18 mg IV at 1253).  Post TNK the patient was taken to neuro interventional radiology for emergent thrombectomy.  Prior to intubation the patient reported nausea and vomited.  He subsequently was intubated and due to emesis remained intubated post procedure.  PCCM consulted for assistance with ICU care post procedure.  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 12/16 admit with acute CVA of right ICA/M1 segment.  S/p thrombectomy and TNK. 12/17 COVID-positive  Interim History / Subjective:     Objective   Blood pressure 128/65, pulse 60, temperature 98.3  F (36.8 C), temperature source Axillary, resp. rate (!) 21, weight 70.8 kg, SpO2 100 %.    Vent Mode: PRVC FiO2 (%):  [40 %-60 %] 40 % Set Rate:  [18 bmp-21 bmp] 21 bmp Vt Set:  [580 mL] 580 mL PEEP:  [5 cmH20-8 cmH20] 5 cmH20 Plateau Pressure:  [13 cmH20-24 cmH20] 13 cmH20   Intake/Output Summary (Last 24 hours) at 07/18/2022 7322 Last data filed at 07/18/2022 0800 Gross per 24 hour  Intake 2069.62 ml  Output 730 ml  Net 1339.62 ml   Filed Weights   07/17/22 1200  Weight: 70.8 kg    Examination: General: Elderly gentleman, intubated on mechanical life support critically ill HENT: Bandage on forehead, endotracheal tube in place Lungs: Bilateral ventilated breath sounds Cardiovascular: Regular rate rhythm, S1-S2 Abdomen: Soft, nontender nondistended Extremities: No edema Neuro: Sedated on mechanical support, on propofol  Resolved Hospital Problem list     Assessment & Plan:   Acute R ICA / M1 Thrombus with CVA  S/p thrombectomy of right ICA terminus at level of a fetal right PCA. Complete recanalization, no hemorrhage or complication Plan: Goal SBP 120-140 MRI planned for today Echo pending Will need SLP PT and OT status post extubation. Antiplatelets and statins per stroke team.  Acute Encephalopathy secondary to CVA  -PAD guidelines sedation with propofol plus fentanyl RASS goal 0 to -1  Acute Hypoxic Respiratory Failure secondary to CVA  Plan: PRVC, low tidal volume ventilation, wean PEEP and FiO2 to maintain sats above 90% SBT SAT once FiO2 decreased. Sedation per  PAD guidelines. Plans for MRI today.  Likely leave intubated for MRI and consider extubation either later this afternoon or tomorrow morning.  Nausea / Vomiting  -PRN Zofran  At Risk Malnutrition  Albumin 3.1 on admit  -Start tube feeds  Mild COVID, CT value of 27 Symptoms started on Tuesday of this past week Symptoms resolved by Thursday per patient's wife. Remains on airborne  precautions.   Best Practice (right click and "Reselect all SmartList Selections" daily)  Diet/type: NPO DVT prophylaxis: SCD GI prophylaxis: PPI Lines: N/A Foley:  N/A Code Status:  full code Last date of multidisciplinary goals of care discussion: per primary  Labs   CBC: Recent Labs  Lab 07/17/22 1237 07/17/22 1238 07/17/22 1608 07/18/22 0335  WBC 4.0  --   --  8.4  NEUTROABS 2.5  --   --   --   HGB 12.8* 13.6 13.3 13.1  HCT 38.7* 40.0 39.0 38.1*  MCV 101.6*  --   --  99.0  PLT 208  --   --  426    Basic Metabolic Panel: Recent Labs  Lab 07/17/22 1237 07/17/22 1238 07/17/22 1608 07/18/22 0335  NA 139 140 137 136  K 4.2 4.1 4.5 3.6  CL 108 106  --  108  CO2 22  --   --  17*  GLUCOSE 110* 109*  --  128*  BUN 16 17  --  10  CREATININE 1.00 1.10  --  0.96  CALCIUM 7.9*  --   --  7.8*   GFR: Estimated Creatinine Clearance: 69.7 mL/min (by C-G formula based on SCr of 0.96 mg/dL). Recent Labs  Lab 07/17/22 1237 07/18/22 0335  WBC 4.0 8.4    Liver Function Tests: Recent Labs  Lab 07/17/22 1237 07/18/22 0335  AST 22 27  ALT 17 20  ALKPHOS 48 51  BILITOT 0.7 0.7  PROT 5.8* 5.9*  ALBUMIN 3.1* 3.1*   No results for input(s): "LIPASE", "AMYLASE" in the last 168 hours. No results for input(s): "AMMONIA" in the last 168 hours.  ABG    Component Value Date/Time   PHART 7.318 (L) 07/17/2022 1608   PCO2ART 40.5 07/17/2022 1608   PO2ART 76 (L) 07/17/2022 1608   HCO3 20.8 07/17/2022 1608   TCO2 22 07/17/2022 1608   ACIDBASEDEF 5.0 (H) 07/17/2022 1608   O2SAT 94 07/17/2022 1608     Coagulation Profile: Recent Labs  Lab 07/17/22 1237  INR 1.0    Cardiac Enzymes: No results for input(s): "CKTOTAL", "CKMB", "CKMBINDEX", "TROPONINI" in the last 168 hours.  HbA1C: No results found for: "HGBA1C"  CBG: Recent Labs  Lab 07/17/22 1232 07/17/22 1613 07/17/22 1948  GLUCAP 112* 144* 105*    Review of Systems:   Unable to complete as patient is  altered on mechanical ventilation.   Past Medical History:  He,  has a past medical history of Celiac disease and Retinal detachment.   Surgical History:   Past Surgical History:  Procedure Laterality Date   CATARACT EXTRACTION Bilateral    Dr. Valetta Close   EYE SURGERY     FRACTURE SURGERY     GAS INSERTION Right 05/21/2021   Procedure: INSERTION OF GAS - C3F8;  Surgeon: Bernarda Caffey, MD;  Location: [place];  Service: Ophthalmology;  Laterality: Right;   GAS/FLUID EXCHANGE Right 05/21/2021   Procedure: GAS/FLUID EXCHANGE;  Surgeon: Bernarda Caffey, MD;  Location: [place];  Service: Ophthalmology;  Laterality: Right;   HERNIA REPAIR     IR  ANGIO INTRA EXTRACRAN SEL INTERNAL CAROTID UNI R MOD SED  07/17/2022   IR PERCUTANEOUS ART THROMBECTOMY/INFUSION INTRACRANIAL INC DIAG ANGIO  07/17/2022   IR US GUIDE VASC ACCESS RIGHT  07/17/2022   PARS PLANA VITRECTOMY Right 05/21/2021   Procedure: PARS PLANA VITRECTOMY WITH 25 GAUGE;  Surgeon: Bernarda Caffey, MD;  Location: [place];  Service: Ophthalmology;  Laterality: Right;   PERFLUORONE INJECTION Right 05/21/2021   Procedure: PERFLUORONE INJECTION;  Surgeon: Bernarda Caffey, MD;  Location: [place];  Service: Ophthalmology;  Laterality: Right;   PHOTOCOAGULATION WITH LASER Right 05/21/2021   Procedure: PHOTOCOAGULATION WITH LASER;  Surgeon: Bernarda Caffey, MD;  Location: [place];  Service: Ophthalmology;  Laterality: Right;   RADIOLOGY WITH ANESTHESIA N/A 07/17/2022   Procedure: IR WITH ANESTHESIA;  Surgeon: Radiologist, Medication, MD;  Location: [place];  Service: Radiology;  Laterality: N/A;   RETINAL DETACHMENT SURGERY       Social History:   reports that he quit smoking about 16 years ago. His smoking use included cigarettes. He has never used smokeless tobacco. He reports current alcohol use of about 4.0 - 5.0 standard drinks of alcohol per week. He reports current drug use. Frequency: 7.00 times per week. Drug: Marijuana.   Family History:  His family  history is not on file.   Allergies Allergies  Allergen Reactions   Codone [Hydrocodone] Hives   Nitrous Oxide     Can cause blindness in right eye    Tylenol [Acetaminophen] Hives   Clarithromycin Anxiety     Home Medications  Prior to Admission medications   Medication Sig Start Date End Date Taking? Authorizing Provider  acidophilus (RISAQUAD) CAPS capsule Take 1 capsule by mouth daily. Align once daily    [provider]  cholecalciferol (VITAMIN D3) 25 MCG (1000 UNIT) tablet Take 1,000 Units by mouth daily.    [provider]  Cyanocobalamin 1000 MCG/ML KIT Inject 1 mL as directed. Every 2 weeks    [provider]  prednisoLONE acetate (PRED FORTE) 1 % ophthalmic suspension Place 1 drop into the left eye 4 (four) times daily. Patient not taking: Reported on 08/04/2021 06/05/21   Bernarda Caffey, MD  psyllium (METAMUCIL) 58.6 % powder Take 1 packet by mouth daily.    [provider]    This patient is critically ill with multiple organ system failure; which, requires frequent high complexity decision making, assessment, support, evaluation, and titration of therapies. This was completed through the application of advanced monitoring technologies and extensive interpretation of multiple databases. During this encounter critical care time was devoted to patient care services described in this note for 32 minutes.   Garner Nash, DO [place] [facility] 07/18/2022 8:23 AM

## 2022-07-18 NOTE — Progress Notes (Signed)
MRI completed.  Stroke team made aware.

## 2022-07-18 NOTE — Progress Notes (Signed)
PT Cancellation Note  Patient Details Name: Douglas Cox MRN: [id number] DOB: 31-May-1951   Cancelled Treatment:    Reason Eval/Treat Not Completed: Medical issues which prohibited therapy (Pt to IR yesterday.  STill intubated and sedated with no plans to extubate today per nurse. Will cancel for today and check back tomorrow.)   Alvira Philips 07/18/2022, 8:44 AM Woody Kronberg M,PT [facility] [phone]

## 2022-07-19 ENCOUNTER — Inpatient Hospital Stay (HOSPITAL_COMMUNITY): Payer: Medicare Other

## 2022-07-19 ENCOUNTER — Telehealth: Payer: Self-pay | Admitting: Gastroenterology

## 2022-07-19 DIAGNOSIS — E43 Unspecified severe protein-calorie malnutrition: Secondary | ICD-10-CM | POA: Insufficient documentation

## 2022-07-19 LAB — COMPREHENSIVE METABOLIC PANEL
ALT: 16 U/L (ref 0–44)
AST: 21 U/L (ref 15–41)
Albumin: 2.6 g/dL — ABNORMAL LOW (ref 3.5–5.0)
Alkaline Phosphatase: 44 U/L (ref 38–126)
Anion gap: 6 (ref 5–15)
BUN: 8 mg/dL (ref 8–23)
CO2: 23 mmol/L (ref 22–32)
Calcium: 7.8 mg/dL — ABNORMAL LOW (ref 8.9–10.3)
Chloride: 111 mmol/L (ref 98–111)
Creatinine, Ser: 0.88 mg/dL (ref 0.61–1.24)
GFR, Estimated: >60 mL/min (ref >60)
Glucose, Bld: 95 mg/dL (ref 70–99)
Potassium: 3.7 mmol/L (ref 3.5–5.1)
Sodium: 140 mmol/L (ref 135–145)
Total Bilirubin: 1 mg/dL (ref 0.3–1.2)
Total Protein: 5.3 g/dL — ABNORMAL LOW (ref 6.5–8.1)

## 2022-07-19 LAB — CBC
HCT: 33.8 % — ABNORMAL LOW (ref 39.0–52.0)
Hemoglobin: 11.4 g/dL — ABNORMAL LOW (ref 13.0–17.0)
MCH: 34.4 pg — ABNORMAL HIGH (ref 26.0–34.0)
MCHC: 33.7 g/dL (ref 30.0–36.0)
MCV: 102.1 fL — ABNORMAL HIGH (ref 80.0–100.0)
Platelets: 172 10*3/uL (ref 150–400)
RBC: 3.31 MIL/uL — ABNORMAL LOW (ref 4.22–5.81)
RDW: 13 % (ref 11.5–15.5)
WBC: 4.9 10*3/uL (ref 4.0–10.5)
nRBC: 0 % (ref 0.0–0.2)

## 2022-07-19 LAB — PHOSPHORUS
Phosphorus: 3 mg/dL (ref 2.5–4.6)
Phosphorus: 3.1 mg/dL (ref 2.5–4.6)

## 2022-07-19 LAB — HEMOGLOBIN A1C
Hgb A1c MFr Bld: 5.7 % — ABNORMAL HIGH (ref 4.8–5.6)
Hgb A1c MFr Bld: 5.7 % — ABNORMAL HIGH (ref 4.8–5.6)
Mean Plasma Glucose: 117 mg/dL
Mean Plasma Glucose: 117 mg/dL

## 2022-07-19 LAB — GLUCOSE, CAPILLARY
Glucose-Capillary: 125 mg/dL — ABNORMAL HIGH (ref 70–99)
Glucose-Capillary: 91 mg/dL (ref 70–99)
Glucose-Capillary: 93 mg/dL (ref 70–99)
Glucose-Capillary: 95 mg/dL (ref 70–99)
Glucose-Capillary: 97 mg/dL (ref 70–99)

## 2022-07-19 LAB — MAGNESIUM
Magnesium: 2 mg/dL (ref 1.7–2.4)
Magnesium: 1.9 mg/dL (ref 1.7–2.4)

## 2022-07-19 MED ORDER — OSMOLITE 1.5 CAL PO LIQD
1000.0000 mL | ORAL | Status: DC
  Administered 2022-07-19 – 2022-07-22 (×4): 1000 mL
  Filled 2022-07-19 (×3): qty 1000

## 2022-07-19 MED ORDER — ASPIRIN 81 MG PO TBEC
81.0000 mg | DELAYED_RELEASE_TABLET | Freq: Every day | ORAL | Status: DC
  Administered 2022-07-22: 81 mg via ORAL
  Filled 2022-07-19: qty 1

## 2022-07-19 MED ORDER — BANATROL TF EN LIQD
60.0000 mL | Freq: Two times a day (BID) | ENTERAL | Status: DC
  Administered 2022-07-19 – 2022-07-22 (×6): 60 mL
  Filled 2022-07-19 (×7): qty 60

## 2022-07-19 MED ORDER — ASPIRIN 300 MG RE SUPP: 300.0000 mg | Freq: Every day | RECTAL | Status: DC

## 2022-07-19 MED ORDER — ASPIRIN 81 MG PO CHEW
81.0000 mg | CHEWABLE_TABLET | Freq: Every day | ORAL | Status: DC
  Administered 2022-07-19 – 2022-07-21 (×3): 81 mg
  Filled 2022-07-19 (×4): qty 1

## 2022-07-19 MED ORDER — ROSUVASTATIN CALCIUM 20 MG PO TABS
20.0000 mg | ORAL_TABLET | Freq: Every day | ORAL | Status: DC
  Administered 2022-07-19 – 2022-07-22 (×4): 20 mg
  Filled 2022-07-19 (×4): qty 1

## 2022-07-19 NOTE — Progress Notes (Signed)
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 He is restless in the bed, not following any commands. His wife is at the bedside.   Allergies: Codone [hydrocodone], Nitrous oxide, Tylenol [acetaminophen], and Clarithromycin  Medications: Prior to Admission medications   Medication Sig Start Date End Date Taking? Authorizing Provider  acidophilus (RISAQUAD) CAPS capsule Take 1 capsule by mouth daily. Align once daily    [provider]  cholecalciferol (VITAMIN D3) 25 MCG (1000 UNIT) tablet Take 1,000 Units by mouth daily.    [provider]  Cyanocobalamin 1000 MCG/ML KIT Inject 1 mL as directed. Every 2 weeks    [provider]  prednisoLONE acetate (PRED FORTE) 1 % ophthalmic suspension Place 1 drop into the left eye 4 (four) times daily. Patient not taking: Reported on 08/04/2021 06/05/21   Bernarda Caffey, MD  psyllium (METAMUCIL) 58.6 % powder Take 1 packet by mouth daily.    [provider]     Vital Signs: BP 130/62   Pulse (!) 45   Temp 99.2 F (37.3 C) (Axillary)   Resp (!) 27   Ht 5' 10" (1.778 m)   Wt 157 lb 10.1 oz (71.5 kg)   SpO2 97%   BMI 22.62 kg/m   Physical Exam Constitutional:      General: He is not in acute distress.    Appearance: He is not ill-appearing.  Cardiovascular:     Rate and Rhythm: Bradycardia present.  Pulmonary:     Effort: Pulmonary effort is normal.  Skin:    General: Skin is warm and dry.  Neurological:     Mental Status: He is alert. He is disoriented.     Comments: Patient non-verbal while I was in the room. He is restless in bed displaying constant movement of the arms and legs, moving his body in the bed. He has bilateral wrist restraints with soft mitts.       Imaging: ECHOCARDIOGRAM COMPLETE  Result Date: 07/18/2022    ECHOCARDIOGRAM REPORT   Patient Name:   Douglas Cox Date of Exam: 07/18/2022 Medical Rec #:  [id number]         Height:       70.0 in Accession #:    [id number]        Weight:       156.1 lb Date of Birth:  Mar 03, 1951         BSA:          1.879 m Patient Age:    71 years          BP:           113/57 mmHg Patient Gender: M                 HR:           73 bpm. Exam Location:  Inpatient Procedure: 2D Echo, Cardiac Doppler and Color Doppler Indications:    Stroke I63.9  History:        Patient has no prior history of Echocardiogram examinations.                 Stroke; Risk Factors:Former Smoker.  Sonographer:    Greer Pickerel Referring Phys: [id number] DENISE A WOLFE  Sonographer Comments: Image acquisition challenging due to uncooperative patient, Image acquisition challenging  due to respiratory motion and Image acquisition challenging due to patient body habitus. IMPRESSIONS  1. Left ventricular ejection fraction, by estimation, is 60 to 65%. The left ventricle has normal function. Left ventricular endocardial border not optimally defined to evaluate regional wall motion. Left ventricular diastolic function could not be evaluated.  2. Right ventricular systolic function is normal. The right ventricular size is normal. Tricuspid regurgitation signal is inadequate for assessing PA pressure.  3. The mitral valve was not well visualized. No evidence of mitral valve regurgitation.  4. The aortic valve was not well visualized. Aortic valve regurgitation is not visualized. No aortic stenosis is present.  5. Limited study stopped early by patient. Comparison(s): No prior Echocardiogram. FINDINGS  Left Ventricle: Left ventricular ejection fraction, by estimation, is 60 to 65%. The left ventricle has normal function. Left ventricular endocardial border not optimally defined to evaluate regional wall motion. The left ventricular internal cavity size was  small. There is no left ventricular hypertrophy. Left ventricular diastolic function could not be evaluated. Right Ventricle: The right ventricular size is normal. No increase in right ventricular wall thickness. Right ventricular systolic function is normal. Tricuspid regurgitation signal is inadequate for assessing PA pressure. Left Atrium: Left atrial size was normal in size. Right Atrium: Right atrial size was normal in size. Pericardium: There is no evidence of pericardial effusion. Mitral Valve: The mitral valve was not well visualized. No evidence of mitral valve regurgitation. Tricuspid Valve: The tricuspid valve is grossly normal. Tricuspid valve regurgitation is not demonstrated. Aortic Valve: The aortic valve was not well visualized. Aortic valve regurgitation is not visualized. No aortic stenosis is present. Pulmonic Valve: The pulmonic valve was not well visualized. Pulmonic valve regurgitation is not visualized. Aorta: The aortic root is normal in size and structure. Venous: The inferior vena cava was not well visualized. IAS/Shunts: No atrial level shunt detected by color flow Doppler.  LEFT VENTRICLE PLAX 2D LVOT diam:     2.00 cm LVOT Area:     3.14 cm  LV Volumes (MOD) LV vol d, MOD A4C: 127.0 ml LV vol s, MOD A4C: 46.5 ml LV SV MOD A4C:     127.0 ml LEFT ATRIUM           Index LA Vol (A4C): 56.5 ml 30.08 ml/m   AORTA Ao Root diam: 3.20 cm MITRAL VALVE MV Area (PHT): 3.00 cm    SHUNTS MV Decel Time: 253 msec    Systemic Diam: 2.00 cm MV E velocity: 81.30 cm/s MV A velocity: 42.30 cm/s MV E/A ratio:  1.92 Rudean Haskell MD Electronically signed by Rudean Haskell MD Signature Date/Time: 07/18/2022/5:58:29 PM    Final    MR BRAIN WO CONTRAST  Result Date: 07/18/2022 CLINICAL DATA:  Stroke, follow-up. EXAM: MRI HEAD WITHOUT CONTRAST TECHNIQUE: Multiplanar, multiecho pulse sequences of the brain and surrounding structures were obtained without intravenous contrast. COMPARISON:  CT  head without contrast 07/17/2022 FINDINGS: Brain: The diffusion-weighted images confirm acute/subacute nonhemorrhagic infarct of the right caudate head and lentiform nucleus. An acute nonhemorrhagic infarct is present in the left caudate head and lentiform nucleus spanning the internal capsule. There is more sparing of the inferior left caudate head on the left. A single punctate focus of restricted diffusion is present in the precentral cortex on image 90 of series 5, the axial diffusion-weighted sequence. This is confirmed on image 56 of series 7. Restricted diffusion is present in the cortex of the right temporal tip. No other significant peripheral cortical  infarcts are present. The examination had to be discontinued prior to completion due to patient unable to tolerate further imaging and attempting to take off the head coil. T2 and FLAIR hyperintensities are associated with the areas of acute/subacute infarction. No other significant white matter disease is present. The ventricles are of normal size. No significant extraaxial fluid collection is present. The internal auditory canals are within normal limits. The brainstem and cerebellum are within normal limits. Vascular: Flow is present in the major intracranial arteries. Skull and upper cervical spine: The craniocervical junction is normal. Upper cervical spine is within normal limits. Marrow signal is unremarkable. Sinuses/Orbits: Fluid is present in the right sphenoid sinus. Mucosal thickening is present the anterior ethmoid air cells and inferior frontal sinus. Left mastoid effusion is present. The globes and orbits are within normal limits. Other: IMPRESSION: 1. Acute/subacute nonhemorrhagic infarct of the right caudate head and lentiform nucleus as noted on the previous CT scan. 2. Acute/subacute nonhemorrhagic infarct of the left caudate head and lentiform nucleus spanning the internal capsule. 3. Acute/subacute nonhemorrhagic infarct of the cortex of  the right temporal tip. 4. Single punctate focus of restricted diffusion in the right precentral cortex. 5. Mild sinus disease. The above was relayed via text pager to [place] on 07/18/2022 at 13:20 . Electronically Signed   By: San Morelle M.D.   On: 07/18/2022 13:21   DG Abd 1 View  Result Date: 07/18/2022 CLINICAL DATA:  Tube placement EXAM: ABDOMEN - 1 VIEW COMPARISON:  None Available. FINDINGS: Upper abdomen image demonstrates a NG tube superimposed with the left upper quadrant. Only the upper portion of the abdomen was included. IMPRESSION: NG tube in place. Electronically Signed   By: Sammie Bench M.D.   On: 07/18/2022 09:28   CT ANGIO HEAD NECK W WO CM  Result Date: 07/17/2022 CLINICAL DATA:  Patient initially presented with left hemiplegia. Following revascularization of the right ICA, the patient now has left hemiplegia. EXAM: CT ANGIOGRAPHY HEAD AND NECK TECHNIQUE: Multidetector CT imaging of the head and neck was performed using the standard protocol during bolus administration of intravenous contrast. Multiplanar CT image reconstructions and MIPs were obtained to evaluate the vascular anatomy. Carotid stenosis measurements (when applicable) are obtained utilizing NASCET criteria, using the distal internal carotid diameter as the denominator. RADIATION DOSE REDUCTION: This exam was performed according to the departmental dose-optimization program which includes automated exposure control, adjustment of the mA and/or kV according to patient size and/or use of iterative reconstruction technique. CONTRAST:  85m OMNIPAQUE IOHEXOL 350 MG/ML SOLN COMPARISON:  Cerebral arteriogram and CTA head and neck of the same day. FINDINGS: CTA NECK FINDINGS Aortic arch: Atherosclerotic changes at the left subclavian artery origin are stable. Right carotid system: Right common carotid artery is again within normal limits. Atherosclerotic calcifications at the right carotid bifurcation and  proximal ICA are stable without significant stenosis or change. Left carotid system: Left common carotid artery is within normal limits. Bifurcation is unremarkable. Cervical left ICA is normal. Vertebral arteries: Left vertebral artery is dominant. No significant interval change. Skeleton: Stable appearance of the cervical spine. Other neck: Soft tissues the neck are otherwise unremarkable. Salivary glands are within normal limits. Thyroid is normal. No significant adenopathy is present. No focal mucosal or submucosal lesions are present. Upper chest: Emphysematous changes again noted. No significant interval change. Review of the MIP images confirms the above findings CTA HEAD FINDINGS Anterior circulation: The right internal carotid artery is reconstituted without focal stenosis or  dissection through the ICA terminus. The left internal carotid artery is stable through the ICA terminus. The right M1 segment scratched at the right M1 and A1 segment are now patent. Right M1 segment scratched at the right A1 segment is dominant. Left M1 segment is normal. The MCA bifurcations are now within normal limits bilaterally. Left MCA branch vessels are stable. ACA branch vessels are stable. Right MCA branch vessels are now revascularized. No distal occlusion is present. Posterior circulation: Vertebral arteries and basilar artery normal. Left posterior cerebral artery originates from the basilar tip. Right posterior cerebral artery is of fetal type. Mild irregularity of distal PCA branch vessels bilaterally is stable. Venous sinuses: The dural sinuses are patent. The straight sinus and deep cerebral veins are intact. Cortical veins are within normal limits. No significant vascular malformation is evident. Anatomic variants: Fetal type right posterior cerebral artery. Review of the MIP images confirms the above findings IMPRESSION: 1. Right internal carotid artery is reconstituted without focal stenosis or dissection through  the ICA terminus. 2. Right M1 segment and MCA bifurcations are now patent. 3. Right MCA branch vessels are now revascularized. 4. No new vessel occlusion involving the left anterior circulation. 5. Stable mild distal small vessel disease without significant proximal stenosis, aneurysm, or branch vessel occlusion within the Circle of Willis. 6. Stable atherosclerotic changes at the right carotid bifurcation and proximal ICA without significant stenosis or change. 7.  Aortic Atherosclerosis (ICD10-I70.0). Electronically Signed   By: San Morelle M.D.   On: 07/17/2022 19:25   IR PERCUTANEOUS ART THROMBECTOMY/INFUSION INTRACRANIAL INC DIAG ANGIO  Result Date: 07/17/2022 INDICATION: 71 year old male presenting with left-sided weakness, dysarthria and; NIHSS 15. His past medical history significant for celiac disease retinal detachment and hemorrhoids. Head CT showed a hyperdense right ICA/MCA, hypodense right insular cortex and basal ganglia (ASPECTS 8). He received IV T NK. CT angiogram of the head and neck showed an occlusion of the intracranial right ICA at the level of the fetal right PCA extending into the proximal right A1 and along the entire M1/MCA segment. He was then transferred to our service for mechanical thrombectomy. EXAM: ULTRASOUND-GUIDED VASCULAR ACCESS DIAGNOSTIC CEREBRAL ANGIOGRAM MECHANICAL THROMBECTOMY FLAT PANEL HEAD CT COMPARISON:  CT/CT angiogram of the head and neck July 17, 2022. MEDICATIONS: 2 g of Ancef IV. The antibiotic was administered within 1 hour of the procedure ANESTHESIA/SEDATION: The procedure was performed under general anesthesia. CONTRAST:  40 mL of Omnipaque 300 milligram/mL FLUOROSCOPY: Radiation Exposure Index (as provided by the fluoroscopic device): 517 mGy Kerma COMPLICATIONS: None immediate. TECHNIQUE: Informed written consent was obtained from the patient's wife after a thorough discussion of the procedural risks, benefits and alternatives. All questions  were addressed. Maximal Sterile Barrier Technique was utilized including caps, mask, sterile gowns, sterile gloves, sterile drape, hand hygiene and skin antiseptic. A timeout was performed prior to the initiation of the procedure. The right groin was prepped and draped in the usual sterile fashion. Using a micropuncture kit and the modified Seldinger technique, access was gained to the right common femoral artery and an 8 French sheath was placed. Real-time ultrasound guidance was utilized for vascular access including the acquisition of a permanent ultrasound image documenting patency of the accessed vessel. Under fluoroscopy, a Zoom 88 guide catheter was navigated over a 6 [place] VTK catheter and a 0.035" Terumo Glidewire into the aortic arch. The catheter was placed into the right common carotid artery and then advanced into the right internal carotid artery. The diagnostic catheter  was removed. Frontal and lateral angiograms of the head were obtained. FINDINGS: 1. Normal caliber of the right common femoral artery, adequate for vascular access. 2. 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 PROCEDURE: Using biplane roadmap guidance, a zoom 71 aspiration catheter was navigated over Colossus 35 microguidewire into the cavernous segment of the right ICA. The aspiration catheter was then advanced to the level of occlusion and connected to an aspiration pump. Continuous aspiration was performed for 1.5 minutes. The guide catheter was connected to a VacLok syringe. The aspiration catheter was subsequently removed under constant aspiration. The guide catheter was aspirated for debris. Right internal carotid artery angiograms with frontal and lateral views of the head showed complete recanalization of the intracranial right ICA and right MCA vascular tree. And occlusion is noted at the distal right A1/ACA. Using biplane roadmap guidance, a zoom 71 aspiration catheter  was navigated over Colossus 35 microguidewire into the cavernous segment of the right ICA. The aspiration catheter was then advanced to the level of occlusion in the distal right A1/ACA and connected to an aspiration pump. Continuous aspiration was performed for 2 minutes. The guide catheter was connected to a VacLok syringe. The aspiration catheter was subsequently removed under constant aspiration. The guide catheter was aspirated for debris. Right internal carotid angiograms with magnified frontal lateral views of the head showed complete recanalization of the right ACA vascular tree. Right internal carotid artery angiograms with full view of the head in frontal and lateral projection show no evidence of thromboembolic complication. Flat panel CT of the head was obtained and post processed in a separate workstation with concurrent attending physician supervision. Selected images were sent to PACS. No evidence hemorrhagic complication. The catheter was subsequently withdrawn. Right common femoral artery angiogram was obtained in right anterior oblique view. The puncture is at the level of the common femoral artery. The artery has normal caliber, adequate for closure device. The sheath was exchanged over the wire for a Perclose prostyle which was utilized for access closure. Immediate hemostasis was achieved. IMPRESSION: 1. Successful mechanical thrombectomy for treatment of an intracranial right internal carotid artery occlusion achieving complete recanalization. 2. No thromboembolic or hemorrhagic complication. PLAN: Transfer to ICU for continued post stroke care. Electronically Signed   By: Pedro Earls M.D.   On: 07/17/2022 18:50   IR ANGIO INTRA EXTRACRAN SEL INTERNAL CAROTID UNI R MOD SED  Result Date: 07/17/2022 INDICATION: 71 year old male presenting with left-sided weakness, dysarthria and; NIHSS 15. His past medical history significant for celiac disease retinal detachment and  hemorrhoids. Head CT showed a hyperdense right ICA/MCA, hypodense right insular cortex and basal ganglia (ASPECTS 8). He received IV T NK. CT angiogram of the head and neck showed an occlusion of the intracranial right ICA at the level of the fetal right PCA extending into the proximal right A1 and along the entire M1/MCA segment. He was then transferred to our service for mechanical thrombectomy. EXAM: ULTRASOUND-GUIDED VASCULAR ACCESS DIAGNOSTIC CEREBRAL ANGIOGRAM MECHANICAL THROMBECTOMY FLAT PANEL HEAD CT COMPARISON:  CT/CT angiogram of the head and neck July 17, 2022. MEDICATIONS: 2 g of Ancef IV. The antibiotic was administered within 1 hour of the procedure ANESTHESIA/SEDATION: The procedure was performed under general anesthesia. CONTRAST:  40 mL of Omnipaque 300 milligram/mL FLUOROSCOPY: Radiation Exposure Index (as provided by the fluoroscopic device): 425 mGy Kerma COMPLICATIONS: None immediate. TECHNIQUE: Informed written consent was obtained from the patient's wife after a  thorough discussion of the procedural risks, benefits and alternatives. All questions were addressed. Maximal Sterile Barrier Technique was utilized including caps, mask, sterile gowns, sterile gloves, sterile drape, hand hygiene and skin antiseptic. A timeout was performed prior to the initiation of the procedure. The right groin was prepped and draped in the usual sterile fashion. Using a micropuncture kit and the modified Seldinger technique, access was gained to the right common femoral artery and an 8 French sheath was placed. Real-time ultrasound guidance was utilized for vascular access including the acquisition of a permanent ultrasound image documenting patency of the accessed vessel. Under fluoroscopy, a Zoom 88 guide catheter was navigated over a 6 [place] VTK catheter and a 0.035" Terumo Glidewire into the aortic arch. The catheter was placed into the right common carotid artery and then advanced into the right internal  carotid artery. The diagnostic catheter was removed. Frontal and lateral angiograms of the head were obtained. FINDINGS: 1. Normal caliber of the right common femoral artery, adequate for vascular access. 2. 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 PROCEDURE: Using biplane roadmap guidance, a zoom 71 aspiration catheter was navigated over Colossus 35 microguidewire into the cavernous segment of the right ICA. The aspiration catheter was then advanced to the level of occlusion and connected to an aspiration pump. Continuous aspiration was performed for 1.5 minutes. The guide catheter was connected to a VacLok syringe. The aspiration catheter was subsequently removed under constant aspiration. The guide catheter was aspirated for debris. Right internal carotid artery angiograms with frontal and lateral views of the head showed complete recanalization of the intracranial right ICA and right MCA vascular tree. And occlusion is noted at the distal right A1/ACA. Using biplane roadmap guidance, a zoom 71 aspiration catheter was navigated over Colossus 35 microguidewire into the cavernous segment of the right ICA. The aspiration catheter was then advanced to the level of occlusion in the distal right A1/ACA and connected to an aspiration pump. Continuous aspiration was performed for 2 minutes. The guide catheter was connected to a VacLok syringe. The aspiration catheter was subsequently removed under constant aspiration. The guide catheter was aspirated for debris. Right internal carotid angiograms with magnified frontal lateral views of the head showed complete recanalization of the right ACA vascular tree. Right internal carotid artery angiograms with full view of the head in frontal and lateral projection show no evidence of thromboembolic complication. Flat panel CT of the head was obtained and post processed in a separate workstation with concurrent attending  physician supervision. Selected images were sent to PACS. No evidence hemorrhagic complication. The catheter was subsequently withdrawn. Right common femoral artery angiogram was obtained in right anterior oblique view. The puncture is at the level of the common femoral artery. The artery has normal caliber, adequate for closure device. The sheath was exchanged over the wire for a Perclose prostyle which was utilized for access closure. Immediate hemostasis was achieved. IMPRESSION: 1. Successful mechanical thrombectomy for treatment of an intracranial right internal carotid artery occlusion achieving complete recanalization. 2. No thromboembolic or hemorrhagic complication. PLAN: Transfer to ICU for continued post stroke care. Electronically Signed   By: Pedro Earls M.D.   On: 07/17/2022 18:50   IR US Guide Vasc Access Right  Result Date: 07/17/2022 INDICATION: 71 year old male presenting with left-sided weakness, dysarthria and; NIHSS 15. His past medical history significant for celiac disease retinal detachment and hemorrhoids. Head CT showed a hyperdense right ICA/MCA,  hypodense right insular cortex and basal ganglia (ASPECTS 8). He received IV T NK. CT angiogram of the head and neck showed an occlusion of the intracranial right ICA at the level of the fetal right PCA extending into the proximal right A1 and along the entire M1/MCA segment. He was then transferred to our service for mechanical thrombectomy. EXAM: ULTRASOUND-GUIDED VASCULAR ACCESS DIAGNOSTIC CEREBRAL ANGIOGRAM MECHANICAL THROMBECTOMY FLAT PANEL HEAD CT COMPARISON:  CT/CT angiogram of the head and neck July 17, 2022. MEDICATIONS: 2 g of Ancef IV. The antibiotic was administered within 1 hour of the procedure ANESTHESIA/SEDATION: The procedure was performed under general anesthesia. CONTRAST:  40 mL of Omnipaque 300 milligram/mL FLUOROSCOPY: Radiation Exposure Index (as provided by the fluoroscopic device): 482 mGy Kerma  COMPLICATIONS: None immediate. TECHNIQUE: Informed written consent was obtained from the patient's wife after a thorough discussion of the procedural risks, benefits and alternatives. All questions were addressed. Maximal Sterile Barrier Technique was utilized including caps, mask, sterile gowns, sterile gloves, sterile drape, hand hygiene and skin antiseptic. A timeout was performed prior to the initiation of the procedure. The right groin was prepped and draped in the usual sterile fashion. Using a micropuncture kit and the modified Seldinger technique, access was gained to the right common femoral artery and an 8 French sheath was placed. Real-time ultrasound guidance was utilized for vascular access including the acquisition of a permanent ultrasound image documenting patency of the accessed vessel. Under fluoroscopy, a Zoom 88 guide catheter was navigated over a 6 [place] VTK catheter and a 0.035" Terumo Glidewire into the aortic arch. The catheter was placed into the right common carotid artery and then advanced into the right internal carotid artery. The diagnostic catheter was removed. Frontal and lateral angiograms of the head were obtained. FINDINGS: 1. Normal caliber of the right common femoral artery, adequate for vascular access. 2. 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 PROCEDURE: Using biplane roadmap guidance, a zoom 71 aspiration catheter was navigated over Colossus 35 microguidewire into the cavernous segment of the right ICA. The aspiration catheter was then advanced to the level of occlusion and connected to an aspiration pump. Continuous aspiration was performed for 1.5 minutes. The guide catheter was connected to a VacLok syringe. The aspiration catheter was subsequently removed under constant aspiration. The guide catheter was aspirated for debris. Right internal carotid artery angiograms with frontal and lateral views of the head  showed complete recanalization of the intracranial right ICA and right MCA vascular tree. And occlusion is noted at the distal right A1/ACA. Using biplane roadmap guidance, a zoom 71 aspiration catheter was navigated over Colossus 35 microguidewire into the cavernous segment of the right ICA. The aspiration catheter was then advanced to the level of occlusion in the distal right A1/ACA and connected to an aspiration pump. Continuous aspiration was performed for 2 minutes. The guide catheter was connected to a VacLok syringe. The aspiration catheter was subsequently removed under constant aspiration. The guide catheter was aspirated for debris. Right internal carotid angiograms with magnified frontal lateral views of the head showed complete recanalization of the right ACA vascular tree. Right internal carotid artery angiograms with full view of the head in frontal and lateral projection show no evidence of thromboembolic complication. Flat panel CT of the head was obtained and post processed in a separate workstation with concurrent attending physician supervision. Selected images were sent to PACS. No evidence hemorrhagic complication. The catheter was subsequently withdrawn.  Right common femoral artery angiogram was obtained in right anterior oblique view. The puncture is at the level of the common femoral artery. The artery has normal caliber, adequate for closure device. The sheath was exchanged over the wire for a Perclose prostyle which was utilized for access closure. Immediate hemostasis was achieved. IMPRESSION: 1. Successful mechanical thrombectomy for treatment of an intracranial right internal carotid artery occlusion achieving complete recanalization. 2. No thromboembolic or hemorrhagic complication. PLAN: Transfer to ICU for continued post stroke care. Electronically Signed   By: Pedro Earls M.D.   On: 07/17/2022 18:50   CT HEAD WO CONTRAST (5MM)  Result Date: 07/17/2022 CLINICAL  DATA:  Status post revascularization of right ICA occlusion EXAM: CT HEAD WITHOUT CONTRAST TECHNIQUE: Contiguous axial images were obtained from the base of the skull through the vertex without intravenous contrast. RADIATION DOSE REDUCTION: This exam was performed according to the departmental dose-optimization program which includes automated exposure control, adjustment of the mA and/or kV according to patient size and/or use of iterative reconstruction technique. COMPARISON:  CT head without contrast 07/17/22 at 12:36 p.m. FINDINGS: Brain: No acute hemorrhage is present. The right caudate head is now hypodense. Diffuse hypoattenuation is present within the right lentiform nucleus. Gray-white differentiation in the insular ribbon is improved. Gray-white differentiation at the right temporal tip is improved as well. This may have been artifactual in the past. No new infarcts are present. Mild right-sided edema is present with slight asymmetric effacement of the sulci. No extra-axial hemorrhage is present. Vascular: Hyperdense vessels are present bilaterally following contrast. This is still slightly more prominent on the right. Skull: Calvarium is intact. Right supraorbital scalp soft tissue swelling is present. No underlying fracture is present. Sinuses/Orbits: Mild mucosal thickening is present throughout the ethmoid air cells and frontoethmoidal recess bilaterally. Secretions are present in the right sphenoid sinus. Bilateral lens replacements are noted. Globes and orbits are otherwise unremarkable. IMPRESSION: 1. Evolving right MCA territory infarct appears to be limited to the right basal ganglia. 2. No acute hemorrhage. 3. No expansion of the infarct. 4. Right supraorbital scalp soft tissue swelling without underlying fracture. Electronically Signed   By: San Morelle M.D.   On: 07/17/2022 18:01   DG CHEST PORT 1 VIEW  Result Date: 07/17/2022 CLINICAL DATA:  Intubation. EXAM: PORTABLE CHEST 1  VIEW COMPARISON:  Chest radiograph 03/25/2021 FINDINGS: Endotracheal tube tip projects 6.6 cm above the carina. Heart is normal in size. No focal consolidation, pleural effusion, or pneumothorax. IMPRESSION: Endotracheal tube tip projects 6.6 cm above the carina. No acute cardiopulmonary abnormality. Electronically Signed   By: Ileana Roup M.D.   On: 07/17/2022 16:08   CT C-SPINE NO CHARGE  Result Date: 07/17/2022 CLINICAL DATA:  Fall EXAM: CT CERVICAL SPINE WITHOUT CONTRAST TECHNIQUE: Multidetector CT imaging of the cervical spine was performed without intravenous contrast. Multiplanar CT image reconstructions were also generated. RADIATION DOSE REDUCTION: This exam was performed according to the departmental dose-optimization program which includes automated exposure control, adjustment of the mA and/or kV according to patient size and/or use of iterative reconstruction technique. COMPARISON:  None Available. FINDINGS: Alignment: Facet joints are aligned without dislocation or traumatic listhesis. Dens and lateral masses are aligned. Skull base and vertebrae: No acute fracture. No primary bone lesion or focal pathologic process. Soft tissues and spinal canal: No prevertebral fluid or swelling. No visible canal hematoma. Disc levels: Moderate degenerative disc disease from C4-5 through C7-T1. Upper chest: Mild emphysematous changes at the lung apices.  Other: None. IMPRESSION: 1. No acute fracture or traumatic listhesis of the cervical spine. 2. Moderate degenerative disc disease from C4-5 through C7-T1. Electronically Signed   By: Davina Poke D.O.   On: 07/17/2022 13:31   CT ANGIO HEAD NECK W WO CM (CODE STROKE)  Result Date: 07/17/2022 CLINICAL DATA:  Code stroke. Left-sided weakness. Right MCA territory infarct. EXAM: CT ANGIOGRAPHY HEAD AND NECK TECHNIQUE: Multidetector CT imaging of the head and neck was performed using the standard protocol during bolus administration of intravenous contrast.  Multiplanar CT image reconstructions and MIPs were obtained to evaluate the vascular anatomy. Carotid stenosis measurements (when applicable) are obtained utilizing NASCET criteria, using the distal internal carotid diameter as the denominator. RADIATION DOSE REDUCTION: This exam was performed according to the departmental dose-optimization program which includes automated exposure control, adjustment of the mA and/or kV according to patient size and/or use of iterative reconstruction technique. CONTRAST:  31m OMNIPAQUE IOHEXOL 350 MG/ML SOLN COMPARISON:  CT head without contrast 07/17/2022. FINDINGS: CTA NECK FINDINGS Aortic arch: Distal arch demonstrates mild calcification. Calcifications present the origin left subclavian artery without significant stenosis. The right scratched at the innominate artery and left common carotid artery origins are not included on the study. Right carotid system: Right common carotid artery is within normal limits. Atherosclerotic calcifications are present at the bifurcation and proximal right ICA without a significant stenosis relative to the more distal vessel. Left carotid system: The left common carotid artery is within normal limits. Bifurcation is unremarkable. Cervical left ICA is normal. Vertebral arteries: The left vertebral artery is the dominant vessel. Both vertebral arteries originate from the subclavian arteries without significant stenosis. No significant stenosis is present in either vertebral artery in the neck. Skeleton: Typical degenerative changes are present in the cervical spine most evident C4-5, C5-6 and C6-7. No focal osseous lesions are present. Other neck: Soft tissues the neck are otherwise unremarkable. Salivary glands are within normal limits. Thyroid is normal. No significant adenopathy is present. No focal mucosal or submucosal lesions are present. Upper chest: Centrilobular and paraseptal emphysematous changes are present. No nodule or mass lesion  is present. No significant airspace consolidation is present. Thoracic inlet is within normal limits. Review of the MIP images confirms the above findings CTA HEAD FINDINGS Anterior circulation: The left internal carotid artery is within normal limits the skull base to the ICA terminus. The right internal carotid artery is within normal limits in the petrous and cavernous segments. The artery is occluded at the level of the posterior communicating artery. The proximal scratched at the right M1 segment is occluded. Contrast is present in the right A1 segment which appears to be the dominant vessel to the level of the thrombus. A smaller left A1 segment is present. Anterior communicating artery is present. Both A2 segments fill normally. There is marked attenuation of a posterior right M2 branch which appears to fill, presumably from PL collateral vessels. Other proximal MCA branches are not visualized. Left MCA branch vessels and bilateral ACA branch vessels are within normal limits proximally. There is some distal attenuation of branch vessels. Retrograde Posterior circulation: The left vertebral artery is dominant vessel. PICA origins are visualized and normal. Vertebrobasilar junction and basilar artery are patent. Basilar artery is small. Right posterior cerebral artery is of fetal type. Left posterior cerebral artery originates from the basilar tip. PCA branch vessels are within normal limits proximally. There is some attenuation of distal PCA branch vessels bilaterally. Venous sinuses: The dural sinuses are  patent. The straight sinus and deep cerebral veins are intact. Cortical veins are within normal limits. No significant vascular malformation is evident. Anatomic variants: Fetal type right posterior cerebral artery Review of the MIP images confirms the above findings IMPRESSION: 1. 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 2. Marked attenuation of a  posterior right M2 branch which appears to fill, presumably from PL collateral vessels. 3. Contrast is present in the right A1 segment which appears to be the dominant vessel to the level of the thrombus. 4. A smaller left A1 segment is present. 5. Both A2 segments fill normally. 6. Mild distal small vessel disease without other significant proximal stenosis, aneurysm, or branch vessel occlusion within the Circle of Willis. 7. Atherosclerotic changes at the right carotid bifurcation and proximal right ICA without significant stenosis relative to the more distal vessel. 8. Fetal type right posterior cerebral artery. 9. Aortic Atherosclerosis (ICD10-I70.0) and Emphysema (ICD10-J43.9). Electronically Signed   By: San Morelle M.D.   On: 07/17/2022 13:19   CT HEAD CODE STROKE WO CONTRAST  Result Date: 07/17/2022 CLINICAL DATA:  Code stroke. Neuro deficit, acute, stroke suspected. EXAM: CT HEAD WITHOUT CONTRAST TECHNIQUE: Contiguous axial images were obtained from the base of the skull through the vertex without intravenous contrast. RADIATION DOSE REDUCTION: This exam was performed according to the departmental dose-optimization program which includes automated exposure control, adjustment of the mA and/or kV according to patient size and/or use of iterative reconstruction technique. COMPARISON:  None Available. FINDINGS: Brain: Loss of gray-white differentiation noted in the right insular cortex and lateral aspect of the right lentiform nucleus. Right caudate head is intact. There is some indistinct no CIS of the posterior parietal cortex at the level of the basal ganglia. No super ganglionic cortical abnormalities are present. Loss of gray-white differentiation noted within the right temporal tip. No acute hemorrhage is present. The ventricles are of normal size. No significant extraaxial fluid collection is present. Insert normal brainstem Vascular: A hyperdense right ICA terminus and proximal right M1  segment noted. Skull: Calvarium is intact. No focal lytic or blastic lesions are present. No significant extracranial soft tissue lesion is present. Sinuses/Orbits: Mild mucosal thickening is present within anterior ethmoid air cells and inferior frontal sinuses bilaterally. No fluid levels are present. The paranasal sinuses and mastoid air cells are otherwise clear. Bilateral lens replacements are noted. Globes and orbits are otherwise unremarkable. Other: None ASPECTS ([place] Stroke Program Early CT Score) - Ganglionic level infarction (caudate, lentiform nuclei, internal capsule, insula, M1-M3 cortex): 4/7 - Supraganglionic infarction (M4-M6 cortex): 3/3 Total score (0-10 with 10 being normal): 7/10 IMPRESSION: 1. Loss of gray-white differentiation in the right insular cortex, lateral aspect of the right lentiform nucleus, and right temporal tip consistent with acute infarct. 2. No acute hemorrhage. 3. Hyperdense right ICA terminus and proximal right M1 segment consistent with acute thrombus. 4. Aspects is 7/10. The above was relayed via text pager to [facility] on 07/17/2022 at 12:51 . Electronically Signed   By: San Morelle M.D.   On: 07/17/2022 12:51    Labs:  CBC: Recent Labs    07/17/22 1237 07/17/22 1238 07/17/22 1608 07/18/22 0335 07/19/22 0556  WBC 4.0  --   --  8.4 4.9  HGB 12.8* 13.6 13.3 13.1 11.4*  HCT 38.7* 40.0 39.0 38.1* 33.8*  PLT 208  --   --  200 172    COAGS: Recent Labs    07/17/22  1237  INR 1.0  APTT 23*    BMP: Recent Labs    07/17/22 1237 07/17/22 1238 07/17/22 1608 07/18/22 0335 07/19/22 0556  NA 139 140 137 136 140  K 4.2 4.1 4.5 3.6 3.7  CL 108 106  --  108 111  CO2 22  --   --  17* 23  GLUCOSE 110* 109*  --  128* 95  BUN 16 17  --  10 8  CALCIUM 7.9*  --   --  7.8* 7.8*  CREATININE 1.00 1.10  --  0.96 0.88  GFRNONAA >60  --   --  >60 >60    LIVER FUNCTION TESTS: Recent Labs    07/17/22 1237 07/18/22 0335 07/19/22 0556   BILITOT 0.7 0.7 1.0  AST _0 ALT _1 ALKPHOS 48 51 44  PROT 5.8* 5.9* 5.3*  ALBUMIN 3.1* 3.1* 2.6*    Assessment and Plan:  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   MRI brain performed 07/18/22 showed: IMPRESSION: 1. Acute/subacute nonhemorrhagic infarct of the right caudate head and lentiform nucleus as noted on the previous CT scan. 2. Acute/subacute nonhemorrhagic infarct of the left caudate head and lentiform nucleus spanning the internal capsule. 3. Acute/subacute nonhemorrhagic infarct of the cortex of the right temporal tip. 4. Single punctate focus of restricted diffusion in the right precentral cortex. 5. Mild sinus disease  Patient was extubated yesterday but did not pass his swallow evaluation. Cortrak placed. He is alert to self and place; follows some commands per nursing notes. Impulsive behavior requiring bilateral wrist restraints and soft mitts. He has been receiving 81 mg aspirin. He remains on airborne/contact precautions for COVID.  Please contact IR with any questions.   Electronically Signed: Soyla Dryer, AGACNP-BC [phone] 07/19/2022, 11:26 AM   I spent a total of 15 Minutes at the the patient's bedside AND on the patient's hospital floor or unit, greater than 50% of which was counseling/coordinating care for right ICA occlusion s/p thrombectomy

## 2022-07-19 NOTE — Telephone Encounter (Signed)
Good afternoon Dr. Silverio Decamp,    Patients wife called stating that she needed to reschedule patients EGD on 12/20 at 10:00 due to patient having a stroke.   I did reschedule him for 2/2 at 10:00 but was not sure if he would need to be cleared before having procedure?

## 2022-07-19 NOTE — Progress Notes (Signed)
Initial Nutrition Assessment  DOCUMENTATION CODES:   Severe malnutrition in context of acute illness/injury  INTERVENTION:  Once confirmed, initiate tube feeding via cortrak tube: Osmolite 1.5 at 60 ml/h (1440 ml per day) Start at 78m/h and increase by [id number] to goal Provides 2160 kcal, 90 gm protein, 1097 ml free water daily Banatrol BID for hx of loose stools  NUTRITION DIAGNOSIS:   Severe Malnutrition (in the context of acute illness) related to poor appetite as evidenced by moderate fat depletion, moderate muscle depletion.  GOAL:   Patient will meet greater than or equal to 90% of their needs  MONITOR:   Diet advancement, Weight trends, TF tolerance, I & O's  REASON FOR ASSESSMENT:  Consult Enteral/tube feeding initiation and management (trickle TF)  ASSESSMENT:   Pt with hx of celiac disease presented to ED with stroke like symptoms. Imaging showed Acute R ICA/M1 thrombus.   12/16 - Cerebral angiogram and mechanical thrombectomy in IR, TNK administered, returned to ICU on Vent 12/17 - extubated 12/18 - FEES, NPO; cortrak placement  Pt sleeping soundly at the time of visit, family at bedside. Cortrak tube was placed earlier and terminates in the distal stomach.   Family reports that pt hasn't had anything to eat since Saturday and that for about 4 days prior to admission appetite was very poor due to feeling bad [place], family states pt has a good appetite and likes to be active and outdoors. Maintains his weight, but reports a 6 lb weight loss in the ~1 week PTA due to poor PO. Muscle and fat deficits noted on exam.   Discussed nutrition plan with family and all questions answered. Family reports that pt struggles with loose stools at baseline and eats a lot of soluable fiber to try and add bulk. Will add banatrol as a preventative measure.    Intake/Output Summary (Last 24 hours) at 07/19/2022 1701 Last data filed at 07/19/2022 1500 Gross per  24 hour  Intake 2001.22 ml  Output 850 ml  Net 1151.22 ml  Net IO Since Admission: 2,659 mL [07/19/22 1701]  Nutritionally Relevant Medications: Scheduled Meds:  famotidine  20 mg Per Tube BID   feeding supplement (PROSource TF20)  60 mL Per Tube Daily   feeding supplement (VITAL HIGH PROTEIN)  1,000 mL Per Tube Q24H   polyethylene glycol  17 g Per Tube Daily   senna-docusate  1 tablet Per Tube BID   Continuous Infusions:  sodium chloride 100 mL/hr at 07/19/22 0900   Labs Reviewed  NUTRITION - FOCUSED PHYSICAL EXAM: Flowsheet Row Most Recent Value  Orbital Region Moderate depletion  Upper Arm Region Moderate depletion  Thoracic and Lumbar Region Severe depletion  Buccal Region Moderate depletion  Temple Region Moderate depletion  Clavicle Bone Region Moderate depletion  Clavicle and Acromion Bone Region Severe depletion  Scapular Bone Region Moderate depletion  Dorsal Hand Unable to assess  Patellar Region Severe depletion  Anterior Thigh Region Severe depletion  Posterior Calf Region Severe depletion  Edema (RD Assessment) None  Hair Reviewed  Eyes Reviewed  Mouth Reviewed  Skin Reviewed  Nails Reviewed    Diet Order:   Diet Order             Diet NPO time specified  Diet effective now                   EDUCATION NEEDS:   Not appropriate for education at this time  Skin:  Skin Assessment: Reviewed  RN Assessment  Last BM:  prior to admission  Height:   Ht Readings from Last 1 Encounters:  07/19/22 5' 10"  (1.778 m)    Weight:   Wt Readings from Last 1 Encounters:  07/19/22 71.5 kg    Ideal Body Weight:  75.5 kg  BMI:  Body mass index is 22.62 kg/m.  Estimated Nutritional Needs:  Kcal:  1900-2100 kcal/d Protein:  90-105g/d Fluid:  >/=2L/d    Ranell Patrick, RD, LDN Clinical Dietitian RD pager # available in [facility]  After hours/weekend pager # available in [facility]

## 2022-07-19 NOTE — Procedures (Signed)
Cortrak  Person Inserting Tube:  Maylon Peppers C, RD Tube Type:  Cortrak - 43 inches Tube Size:  10 Tube Location:  Right nare Secured by: Bridle Technique Used to Measure Tube Placement:  Marking at nare/corner of mouth Cortrak Secured At:  69 cm   Cortrak Tube Team Note:  Consult received to place a Cortrak feeding tube.   X-ray is required, abdominal x-ray has been ordered by the Cortrak team. Please confirm tube placement before using the Cortrak tube.   If the tube becomes dislodged please keep the tube and contact the Cortrak team at [email or web address] for replacement.  If after hours and replacement cannot be delayed, place a NG tube and confirm placement with an abdominal x-ray.    Lockie Pares., RD, LDN, CNSC See AMi[facility] for contact information

## 2022-07-19 NOTE — Evaluation (Signed)
Physical Therapy Evaluation Patient Details Name: Douglas Cox MRN: [id number] DOB: [birth year] Today's Date: 07/19/2022  History of Present Illness  Douglas Cox is an 71 y.o. 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  PMHx of cataracts, celiac disease and retinal detachment  Clinical Impression  Pt admitted with/for s/s of stroke.  Pt needing minimal assist of 1-2 persons for basic mobility/gait.[name]  Pt currently limited functionally due to the problems listed. ( See problems list.)   Pt will benefit from PT to maximize function and safety in order to get ready for next venue listed below.        Recommendations for follow up therapy are one component of a multi-disciplinary discharge planning process, led by the attending physician.  Recommendations may be updated based on patient status, additional functional criteria and insurance authorization.  Follow Up Recommendations Acute inpatient rehab (3hours/day)      Assistance Recommended at Discharge Set up Supervision/Assistance  Patient can return home with the following  A little help with walking and/or transfers;A little help with bathing/dressing/bathroom;Assistance with cooking/housework;Assist for transportation;Help with stairs or ramp for entrance    Equipment Recommendations Other (comment) (TBD)  Recommendations for Other Services       Functional Status Assessment Patient has had a recent decline in their functional status and demonstrates the ability to make significant improvements in function in a reasonable and predictable amount of time.     Precautions /  Restrictions Precautions Precautions: Fall Precaution Comments: Bil wrist restraints, Bil Mitts, posey Restrictions Weight Bearing Restrictions: No      Mobility  Bed Mobility Overal bed mobility: Needs Assistance Bed Mobility: Supine to Sit, Sit to Supine     Supine to sit: Min guard, HOB elevated Sit to supine: Min assist        Transfers Overall transfer level: Needs assistance Equipment used: 1 person hand held assist Transfers: Sit to/from Stand Sit to Stand: Min assist                Ambulation/Gait Ambulation/Gait assistance: Min Web designer (Feet): 40 Feet   Gait Pattern/deviations: Step-through pattern   Gait velocity interpretation: <1.31 ft/sec, indicative of household ambulator   General Gait Details: stable paretic gait pattern, pt able to switch between forward and sidestepping as obstacles dictated. slower and guarded.  Stairs            Wheelchair Mobility    Modified Rankin (Stroke Patients Only) Modified Rankin (Stroke Patients Only) Pre-Morbid Rankin Score: No symptoms Modified Rankin: Moderately severe disability     Balance Overall balance assessment: Needs assistance Sitting-balance support: No upper extremity supported, Feet supported Sitting balance-Leahy Scale: Fair     Standing balance support: Single extremity supported Standing balance-Leahy Scale: Poor                               Pertinent Vitals/Pain Pain Assessment Pain Assessment: Faces Faces Pain Scale: No hurt Pain Intervention(s): Monitored during session    Home Living Family/patient expects to be discharged to:: Private residence Living Arrangements: Spouse/significant other Available Help at Discharge: Family;Available 24 hours/day Type of Home: House Home Access: Stairs to enter Entrance  Stairs-Rails: None Entrance Stairs-Number of Steps: 3 Alternate Level Stairs-Number of Steps: flight Home Layout: Two level;1/2 bath on  main level;Bed/bath upstairs Home Equipment: None      Prior Function Prior Level of Function : Independent/Modified Independent;Driving                     Hand Dominance   Dominant Hand: Right    Extremity/Trunk Assessment   Upper Extremity Assessment Upper Extremity Assessment: Defer to OT evaluation            Communication   Communication: No difficulties  Cognition Arousal/Alertness: Lethargic Behavior During Therapy: Flat affect, Restless Overall Cognitive Status: Difficult to assess                                 General Comments: The only question he answered was "right" to are you right or left handed and verbalization was really soft        General Comments      Exercises     Assessment/Plan    PT Assessment Patient needs continued PT services  PT Problem List Decreased strength;Decreased activity tolerance;Decreased balance;Decreased mobility;Decreased coordination       PT Treatment Interventions Gait training;Functional mobility training;Therapeutic activities;Balance training;Neuromuscular re-education;Patient/family education;DME instruction    PT Goals (Current goals can be found in the Care Plan section)  Acute Rehab PT Goals Patient Stated Goal: home independent PT Goal Formulation: With patient Time For Goal Achievement: 08/02/22 Potential to Achieve Goals: Good    Frequency Min 3X/week     Co-evaluation PT/OT/SLP Co-Evaluation/Treatment: Yes Reason for Co-Treatment: For patient/therapist safety PT goals addressed during session: Mobility/safety with mobility         AM-PAC PT "6 Clicks" Mobility  Outcome Measure Help needed turning from your back to your side while in a flat bed without using bedrails?: A Little Help needed moving from lying on your back to sitting on the side of a flat bed without using bedrails?: A Little Help needed moving to and from a bed to a chair (including a wheelchair)?: A  Little Help needed standing up from a chair using your arms (e.g., wheelchair or bedside chair)?: A Little Help needed to walk in hospital room?: Total Help needed climbing 3-5 steps with a railing? : Total 6 Click Score: 14    End of Session   Activity Tolerance: Patient tolerated treatment well Patient left: in bed;with call bell/phone within reach Nurse Communication: Mobility status PT Visit Diagnosis: Unsteadiness on feet (R26.81);Other abnormalities of gait and mobility (R26.89);Other symptoms and signs involving the nervous system (R29.898)    Time: 3151-7616 PT Time Calculation (min) (ACUTE ONLY): 33 min   Charges:   PT Evaluation $PT Eval Moderate Complexity: 1 Mod          07/19/2022  Ginger Carne., PT Acute Rehabilitation Services [phone]  (office)  Tessie Fass [place] 07/19/2022, 7:23 PM

## 2022-07-19 NOTE — Procedures (Signed)
Objective Swallowing Evaluation: Type of Study: FEES-Fiberoptic Endoscopic Evaluation of Swallow   Patient Details  Name: Douglas Cox MRN: [id number] Date of Birth: Dec 06, 1950  Today's Date: 07/19/2022 Time: SLP Start Time (ACUTE ONLY): 9480 -SLP Stop Time (ACUTE ONLY): 0943  SLP Time Calculation (min) (ACUTE ONLY): 20 min   Past Medical History:  Past Medical History:  Diagnosis Date   Celiac disease    Retinal detachment    Past Surgical History:  Past Surgical History:  Procedure Laterality Date   CATARACT EXTRACTION Bilateral    Dr. Valetta Close   EYE SURGERY     FRACTURE SURGERY     GAS INSERTION Right 05/21/2021   Procedure: INSERTION OF GAS - C3F8;  Surgeon: Bernarda Caffey, MD;  Location: [place];  Service: Ophthalmology;  Laterality: Right;   GAS/FLUID EXCHANGE Right 05/21/2021   Procedure: GAS/FLUID EXCHANGE;  Surgeon: Bernarda Caffey, MD;  Location: [place];  Service: Ophthalmology;  Laterality: Right;   HERNIA REPAIR     IR ANGIO INTRA EXTRACRAN SEL INTERNAL CAROTID UNI R MOD SED  07/17/2022   IR PERCUTANEOUS ART THROMBECTOMY/INFUSION INTRACRANIAL INC DIAG ANGIO  07/17/2022   IR US GUIDE VASC ACCESS RIGHT  07/17/2022   PARS PLANA VITRECTOMY Right 05/21/2021   Procedure: PARS PLANA VITRECTOMY WITH 25 GAUGE;  Surgeon: Bernarda Caffey, MD;  Location: [place];  Service: Ophthalmology;  Laterality: Right;   PERFLUORONE INJECTION Right 05/21/2021   Procedure: PERFLUORONE INJECTION;  Surgeon: Bernarda Caffey, MD;  Location: [place];  Service: Ophthalmology;  Laterality: Right;   PHOTOCOAGULATION WITH LASER Right 05/21/2021   Procedure: PHOTOCOAGULATION WITH LASER;  Surgeon: Bernarda Caffey, MD;  Location: [place];  Service: Ophthalmology;  Laterality: Right;   RADIOLOGY WITH ANESTHESIA N/A 07/17/2022   Procedure: IR WITH ANESTHESIA;  Surgeon: Radiologist, Medication, MD;  Location: [place];  Service: Radiology;  Laterality: N/A;   RETINAL DETACHMENT SURGERY     HPI: [name] is  a 71 y.o. male who presented to the ED with acute onset of left hemiplegia, right gaze deviation and left hemineglect while working in his garage.  TNK given and recanalization acheived with thrombectomy.  Later at 1800 noted right sided weakness and new CT Head and CTA head and neck reordered and negative for reocclusion. MRI today shows bilateral basal ganglia infarcts. ASA 69m started 12/17  Pt with ETT for thrombectomy and overnight.  Extubated 12/17   Subjective: Awake, alert    Recommendations for follow up therapy are one component of a multi-disciplinary discharge planning process, led by the attending physician.  Recommendations may be updated based on patient status, additional functional criteria and insurance authorization.  Assessment / Plan / Recommendation     07/19/2022   10:07 AM  Clinical Impressions  Clinical Impression Pt presents with a moderate-severe oropharyngeal dysphagia c/b reduced base of tongue retraction, decreased pharyngeal constriction, incomplete laryngeal closure, and diminished sensation.  These deficits resulted in penetration and eventual aspiration of all consistencies trialed with inconsistent cough response.  Penetration of thin liquid occured during swallow.  With thicker consistencies initial swallow was more functional.  There was trace, shallow penetration of honey thick liquid.  With puree there was no penetration during inital swallow, but there was significant base of tongue residue.  Pharyngeal edema is impacting swallow function as well.  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  Pharyngeal spaces (vallecula, lateral channels, pyriforms) were obliterated.  The majority of aspiration occurred after  the swallow 2/2 residue spilling into laryngeal vestibule.  When pt exhibited reflexive cough it was strong enough to clear penetration; however, because of edema, it consistently spilled back  into laryngeal vestibule with eventual aspiration. Pt is not safe for any oral diet at this time.    Recommend pt continue NPO and recommend short term alternate means of nutrition.  Pt may have ice chips for comfort in moderation, after good oral care, when fully awake/alert, with upright positioning and supervision.   SLP Visit Diagnosis Dysphagia, pharyngeal phase (R13.13)  Impact on safety and function Severe aspiration risk         07/19/2022   10:07 AM  Treatment Recommendations  Treatment Recommendations Therapy as outlined in treatment plan below        07/19/2022   10:17 AM  Prognosis  Prognosis for Safe Diet Advancement Good       07/19/2022   10:07 AM  Diet Recommendations  SLP Diet Recommendations NPO;Alternative means - temporary  Medication Administration Via alternative means         07/19/2022   10:07 AM  Other Recommendations  Oral Care Recommendations Oral care QID  Follow Up Recommendations --  Functional Status Assessment Patient has had a recent decline in their functional status and demonstrates the ability to make significant improvements in function in a reasonable and predictable amount of time.       07/19/2022   10:07 AM  Frequency and Duration   Speech Therapy Frequency (ACUTE ONLY) min 2x/week  Treatment Duration 2 weeks         07/19/2022   10:05 AM  Oral Phase  Oral Phase WFL  Oral - Honey Cup [facility]  Oral - Thin Straw WFL  Oral - Puree [facility]       07/19/2022   10:05 AM  Pharyngeal Phase  Pharyngeal Phase Impaired  Pharyngeal- Honey Cup Reduced airway/laryngeal closure;Penetration/Apiration after swallow;Reduced pharyngeal peristalsis  Pharyngeal Material enters airway, passes BELOW cords and not ejected out despite cough attempt by patient;Material enters airway, passes BELOW cords without attempt by patient to eject out (silent aspiration)  Pharyngeal- Thin Straw Reduced pharyngeal peristalsis;Reduced airway/laryngeal  closure;Penetration/Apiration after swallow;Penetration/Aspiration during swallow  Pharyngeal Material enters airway, passes BELOW cords and not ejected out despite cough attempt by patient  Pharyngeal- Puree Reduced pharyngeal peristalsis;Reduced tongue base retraction;Reduced airway/laryngeal closure;Penetration/Apiration after swallow  Pharyngeal Material enters airway, passes BELOW cords without attempt by patient to eject out (silent aspiration)        07/19/2022   10:07 AM  Cervical Esophageal Phase   Cervical Esophageal Phase [facility]     Celedonio Savage, MA, [place] Office: [phone] 07/19/2022, 10:17 AM

## 2022-07-19 NOTE — Progress Notes (Addendum)
STROKE TEAM PROGRESS NOTE   INTERVAL HISTORY Patient is seen in his room with no family at the bedside.  He has been hemodynamically stable overnight and was extubated yesterday.  He is able to state his name and can follow simple commands but is restless.  He failed his swallow study, and a cortrack was placed.  Wrist restraints were applied, as patient would constantly attempt to pull the cortrack.   Vitals:   07/19/22 1100 07/19/22 1200 07/19/22 1300 07/19/22 1400  BP: (!) 122/58 119/67 (!) 146/67 (!) 122/58  Pulse: (!) 42 (!) 48 (!) 50 (!) 48  Resp: (!) 25 19 (!) 24 (!) 26  Temp:      TempSrc:      SpO2: 100% 100% 94% 98%  Weight:      Height:       CBC:  Recent Labs  Lab 07/17/22 1237 07/17/22 1238 07/18/22 0335 07/19/22 0556  WBC 4.0  --  8.4 4.9  NEUTROABS 2.5  --   --   --   HGB 12.8*   < > 13.1 11.4*  HCT 38.7*   < > 38.1* 33.8*  MCV 101.6*  --  99.0 102.1*  PLT 208  --  200 172   < > = values in this interval not displayed.    Basic Metabolic Panel:  Recent Labs  Lab 07/18/22 0335 07/18/22 1838 07/19/22 0556  NA 136  --  140  K 3.6  --  3.7  CL 108  --  111  CO2 17*  --  23  GLUCOSE 128*  --  95  BUN 10  --  8  CREATININE 0.96  --  0.88  CALCIUM 7.8*  --  7.8*  MG 1.9 1.9 2.0  PHOS 2.6 3.3 3.0    Lipid Panel:  Recent Labs  Lab 07/18/22 0335  CHOL 176  TRIG 241*  246*  HDL 44  CHOLHDL 4.0  VLDL 48*  LDLCALC 84    HgbA1c:  Recent Labs  Lab 07/18/22 1500  HGBA1C 5.7*   Urine Drug Screen: No results for input(s): "LABOPIA", "COCAINSCRNUR", "LABBENZ", "AMPHETMU", "THCU", "LABBARB" in the last 168 hours.  Alcohol Level  Recent Labs  Lab 07/17/22 1237  ETH <10     IMAGING past 24 hours DG Abd Portable 1V  Result Date: 07/19/2022 CLINICAL DATA:  Feeding tube placement. EXAM: PORTABLE ABDOMEN - 1 VIEW COMPARISON:  Abdominal radiograph yesterday. FINDINGS: Tip of the weighted enteric tube is just to the right of midline in the region  of the distal stomach. Overlying artifact in place. Nonobstructive upper abdominal bowel gas pattern. IMPRESSION: Tip of the weighted enteric tube projecting over the distal stomach. Electronically Signed   By: Keith Rake M.D.   On: 07/19/2022 12:35   ECHOCARDIOGRAM COMPLETE  Result Date: 07/18/2022    ECHOCARDIOGRAM REPORT   Patient Name:   EUCLIDE GRANITO Date of Exam: 07/18/2022 Medical Rec #:  [id number]         Height:       70.0 in Accession #:    [id number]        Weight:       156.1 lb Date of Birth:  11/12/1950         BSA:          1.879 m Patient Age:    71 years          BP:           113/57 mmHg  Patient Gender: M                 HR:           73 bpm. Exam Location:  Inpatient Procedure: 2D Echo, Cardiac Doppler and Color Doppler Indications:    Stroke I63.9  History:        Patient has no prior history of Echocardiogram examinations.                 Stroke; Risk Factors:Former Smoker.  Sonographer:    Greer Pickerel Referring Phys: [phone] DENISE A WOLFE  Sonographer Comments: Image acquisition challenging due to uncooperative patient, Image acquisition challenging due to respiratory motion and Image acquisition challenging due to patient body habitus. IMPRESSIONS  1. Left ventricular ejection fraction, by estimation, is 60 to 65%. The left ventricle has normal function. Left ventricular endocardial border not optimally defined to evaluate regional wall motion. Left ventricular diastolic function could not be evaluated.  2. Right ventricular systolic function is normal. The right ventricular size is normal. Tricuspid regurgitation signal is inadequate for assessing PA pressure.  3. The mitral valve was not well visualized. No evidence of mitral valve regurgitation.  4. The aortic valve was not well visualized. Aortic valve regurgitation is not visualized. No aortic stenosis is present.  5. Limited study stopped early by patient. Comparison(s): No prior Echocardiogram. FINDINGS  Left Ventricle:  Left ventricular ejection fraction, by estimation, is 60 to 65%. The left ventricle has normal function. Left ventricular endocardial border not optimally defined to evaluate regional wall motion. The left ventricular internal cavity size was small. There is no left ventricular hypertrophy. Left ventricular diastolic function could not be evaluated. Right Ventricle: The right ventricular size is normal. No increase in right ventricular wall thickness. Right ventricular systolic function is normal. Tricuspid regurgitation signal is inadequate for assessing PA pressure. Left Atrium: Left atrial size was normal in size. Right Atrium: Right atrial size was normal in size. Pericardium: There is no evidence of pericardial effusion. Mitral Valve: The mitral valve was not well visualized. No evidence of mitral valve regurgitation. Tricuspid Valve: The tricuspid valve is grossly normal. Tricuspid valve regurgitation is not demonstrated. Aortic Valve: The aortic valve was not well visualized. Aortic valve regurgitation is not visualized. No aortic stenosis is present. Pulmonic Valve: The pulmonic valve was not well visualized. Pulmonic valve regurgitation is not visualized. Aorta: The aortic root is normal in size and structure. Venous: The inferior vena cava was not well visualized. IAS/Shunts: No atrial level shunt detected by color flow Doppler.  LEFT VENTRICLE PLAX 2D LVOT diam:     2.00 cm LVOT Area:     3.14 cm  LV Volumes (MOD) LV vol d, MOD A4C: 127.0 ml LV vol s, MOD A4C: 46.5 ml LV SV MOD A4C:     127.0 ml LEFT ATRIUM           Index LA Vol (A4C): 56.5 ml 30.08 ml/m   AORTA Ao Root diam: 3.20 cm MITRAL VALVE MV Area (PHT): 3.00 cm    SHUNTS MV Decel Time: 253 msec    Systemic Diam: 2.00 cm MV E velocity: 81.30 cm/s MV A velocity: 42.30 cm/s MV E/A ratio:  1.92 Rudean Haskell MD Electronically signed by Rudean Haskell MD Signature Date/Time: 07/18/2022/5:58:29 PM    Final     PHYSICAL  EXAM  Physical Exam  Constitutional: Appears well-developed and well-nourished.   Cardiovascular: Normal rate and regular rhythm.  Respiratory: Effort normal, non-labored  breathing  Neuro: Mental Status: Opens eyes to repeated stimuli, able to state name and follow simple commands Cranial Nerves: II: Visual Fields are full. Pupils are equal, round, and reactive to light.   III,IV, VI: Extraocular movements intact VIII: Earring intact to voice X: cough and gag intact XI: head is midline Motor: Tone is normal. Bulk is normal. Able to follow commands with all 4 extremities  Sensory: Appears intact to light touch throughout    ASSESSMENT/PLAN Mr. Marguerite Jarboe is a 71 y.o. male with history of cataracts, celiac disease and retinal detachment who presents to the ED with acute onset of left hemiplegia, right gaze deviation and left hemineglect while working in his garage, he does have an abrasion on his forehead but denied hitting his head in the garage.  TNK was given and he was emergently taken for a mechanical thrombectomy of right ICA terminus at the level of a fetal right PCA. Mechanical thrombectomy performedd with direct contact aspiration achieving completer recanalization (TICI3).  1800 noted right sided weakness and new CT Head and CTA head and neck reordered and negative for reocclusion. MRI shows bilateral basal ganglia infarcts. 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 Marked attenuation of a posterior right M2 branch which appears to  fill, presumably from PL collateral vessels.  Repeat CT Head Evolving right MCA territory infarct appears to be limited to the right basal ganglia. Repeat CTA Head and Neck- Right internal carotid artery is reconstituted without focal stenosis or dissection through the ICA terminus. Right M1 segment and MCA bifurcations are now patent. Right MCA branch vessels are now revascularized. MRI  Acute/subacute nonhemorrhagic infarct of the right caudate head and lentiform nucleus as noted on the previous CT scan. Acute/subacute nonhemorrhagic infarct of the left caudate head and lentiform nucleus spanning the internal capsule. Acute/subacute nonhemorrhagic infarct of the cortex of the right temporal tip. Single punctate focus of restricted diffusion in the right precentral cortex. 2D Echo  LDL 84 HgbA1c 5.7. VTE prophylaxis -     Diet   Diet NPO time specified   No antithrombotic prior to admission, now on aspirin 300 mg suppository daily. May consider OOzarkin 3-5 days.  Use ASA 843monce able to take PO medications  Therapy recommendations:  Pending Disposition:  Pending   Hypertension Home meds:   None Stable BP Goal keep systolic blood pressure less than 180 Long-term BP goal normotensive  Hyperlipidemia LDL 84, goal < 70 Add rosuvastatin 20 mg daily Continue statin at discharge  Diabetes type II Controlled Home meds:  None HgbA1c 5.7, goal < 7.0 CBGs Recent Labs    07/19/22 0030 07/19/22 0522 07/19/22 1145  GLUCAP 93 95 91     SSI  Other Stroke Risk Factors Advanced Age >/= 6575 Other Active Problems Respiratory failure COVID positive- symptoms started on Tuesday 12/12 Intubated for procedure Extubated 12/17  [facility]  Patient seen and examined by NP/APP with MD. MD to update note as needed.   [place]  MSN, AGACNP-BC [facility] See Amion for schedule and pager information 07/19/2022 2:17 PM   I have  personally obtained history,examined this patient, reviewed notes, independently viewed imaging studies, participated in medical decision making and plan of care.ROS completed by me personally and pertinent positives fully documented  I have made any additions or clarifications directly to the above note. Agree with note above.  Patient's MRI shows bilateral basal ganglia and right MCA embolic infarcts but neurologically seems to be doing reasonably well though is a little bit agitated.  Continue close neurological observation and strict blood pressure control.  Mobilize out of bed physical occupational and speech therapy consults.  No family available at the bedside for discussion.  Continue management of COVID-positive status as per critical care team.  Discussed with Dr. Valeta Harms critical care medicine.  This patient is critically ill and at significant risk of neurological worsening, death and care requires constant monitoring of vital signs, hemodynamics,respiratory and cardiac monitoring, extensive review of multiple databases, frequent neurological assessment, discussion with family, other specialists and medical decision making of high complexity.I have made any additions or clarifications directly to the above note.This critical care time does not reflect procedure time, or teaching time or supervisory time of PA/NP/Med Resident etc but could involve care discussion time.  I spent 30 minutes of neurocritical care time  in the care of  this patient.      Antony Contras, MD Medical Director [facility] Pager: [phone] 07/19/2022 5:25 PM   To contact Stroke Continuity provider, please refer to [email or web address] After hours, contact General Neurology

## 2022-07-19 NOTE — Evaluation (Signed)
Occupational Therapy Evaluation Patient Details Name: Douglas Cox MRN: [id number] DOB: 1951-04-15 Today's Date: 07/19/2022   History of Present Illness Douglas Cox is an 71 y.o. male with a PMHx of cataracts, celiac disease and retinal detachment who presents to the ED with acute onset of left hemiplegia, right gaze deviation and left hemineglect.MRI: Acute/subacute nonhemorrhagic infarct of the right caudate head and lentiform nucleus; acute/subacute nonhemorrhagic infarct of the left caudate head and lentiform nucleus spanning the internal capsule; Acute/subacute nonhemorrhagic infarct of the cortex of the right temporal tip.   Clinical Impression   This 70 yo male admitted with above presents to acute OT with PLOF of being totally independent with basic ADLs, IADLs, and driving. Currently he is Max A to total A for ADLs and min A with mobility mainly due to cognition and decreased alertness. He will continue to benefit from acute OT with follow up OT on [facility] to then go home with wife.     Recommendations for follow up therapy are one component of a multi-disciplinary discharge planning process, led by the attending physician.  Recommendations may be updated based on patient status, additional functional criteria and insurance authorization.   Follow Up Recommendations  Acute inpatient rehab (3hours/day)     Assistance Recommended at Discharge Frequent or constant Supervision/Assistance  Patient can return home with the following A little help with walking and/or transfers;A lot of help with bathing/dressing/bathroom;Assistance with cooking/housework;Assistance with feeding;Help with stairs or ramp for entrance;Assist for transportation;Direct supervision/assist for financial management;Direct supervision/assist for medications management    Functional Status Assessment  Patient has had a recent decline in their functional status and demonstrates the ability to make significant  improvements in function in a reasonable and predictable amount of time.  Equipment Recommendations  Other (comment) (TBD next venue)    Recommendations for Other Services Rehab consult     Precautions / Restrictions Precautions Precautions: Fall Precaution Comments: Bil wrist restraints, Bil Mitts, posey Restrictions Weight Bearing Restrictions: No      Mobility Bed Mobility Overal bed mobility: Needs Assistance Bed Mobility: Supine to Sit, Sit to Supine     Supine to sit: Min guard, HOB elevated Sit to supine: Min assist        Transfers Overall transfer level: Needs assistance Equipment used: 1 person hand held assist Transfers: Sit to/from Stand Sit to Stand: Min assist                  Balance Overall balance assessment: Needs assistance Sitting-balance support: No upper extremity supported, Feet supported Sitting balance-Leahy Scale: Fair     Standing balance support: Single extremity supported Standing balance-Leahy Scale: Poor                             ADL either performed or assessed with clinical judgement   ADL Overall ADL's : Needs assistance/impaired Eating/Feeding: Total assistance;NPO Eating/Feeding Details (indicate cue type and reason): cortrak Grooming: Maximal assistance;Sitting   Upper Body Bathing: Maximal assistance;Sitting   Lower Body Bathing: Maximal assistance Lower Body Bathing Details (indicate cue type and reason): min A sit<>stand Upper Body Dressing : Maximal assistance;Sitting   Lower Body Dressing: Maximal assistance Lower Body Dressing Details (indicate cue type and reason): min A sit<>stand Toilet Transfer: Minimal assistance;+2 for safety/equipment Toilet Transfer Details (indicate cue type and reason): bed>window>door>bed Toileting- Clothing Manipulation and Hygiene: Maximal assistance Toileting - Clothing Manipulation Details (indicate cue type and reason): min A  sit<>stand  Vision Baseline Vision/History: 1 Wears glasses Ability to See in Adequate Light: 0 Adequate Patient Visual Report:  (unable to state) Additional Comments: Pt kept eyes closed ~80 % session            Pertinent Vitals/Pain Pain Assessment Pain Assessment: Faces Faces Pain Scale: No hurt     Hand Dominance Right   Extremity/Trunk Assessment Upper Extremity Assessment Upper Extremity Assessment: Overall WFL for tasks assessed           Communication Communication Communication: No difficulties   Cognition Arousal/Alertness: Lethargic Behavior During Therapy: Flat affect, Restless Overall Cognitive Status: Difficult to assess                                 General Comments: The only question he answered was "right" to are you right or left handed and verbalization was really soft                Home Living Family/patient expects to be discharged to:: Private residence Living Arrangements: Spouse/significant other Available Help at Discharge: Family;Available 24 hours/day Type of Home: House Home Access: Stairs to enter [employer] of Steps: 3 Entrance Stairs-Rails: None Home Layout: Two level;1/2 bath on main level;Bed/bath upstairs Alternate Level Stairs-Number of Steps: flight Alternate Level Stairs-Rails:  (rail on one side)     Biochemist, clinical: Standard     Home Equipment: None      Lives With: Spouse    Prior Functioning/Environment Prior Level of Function : Independent/Modified Independent;Driving                        OT Problem List: Impaired balance (sitting and/or standing);Impaired vision/perception;Decreased cognition;Decreased safety awareness      OT Treatment/Interventions: Self-care/ADL training;DME and/or AE instruction;Balance training;Patient/family education;Visual/perceptual remediation/compensation;Therapeutic activities    OT Goals(Current goals can be found in the care plan section)  Acute Rehab OT Goals Patient Stated Goal: unable, wife would like him to go to [facility] OT Goal Formulation: With family Time For Goal Achievement: 08/09/22 Potential to Achieve Goals: Good  OT Frequency: Min 2X/week    Co-evaluation PT/OT/SLP Co-Evaluation/Treatment: Yes Reason for Co-Treatment: Necessary to address cognition/behavior during functional activity;For patient/therapist safety;To address functional/ADL transfers PT goals addressed during session: Mobility/safety with mobility;Balance OT goals addressed during session: ADL's and self-care;Strengthening/ROM      AM-PAC OT "6 Clicks" Daily Activity     Outcome Measure Help from another person eating meals?: Total Help from another person taking care of personal grooming?: A Lot Help from another person toileting, which includes using toliet, bedpan, or urinal?: A Lot Help from another person bathing (including washing, rinsing, drying)?: A Lot Help from another person to put on and taking off regular upper body clothing?: A Lot Help from another person to put on and taking off regular lower body clothing?: A Lot 6 Click Score: 11   End of Session Nurse Communication: Mobility status  Activity Tolerance: Patient tolerated treatment well Patient left: in bed;with call bell/phone within reach;with bed alarm set;with family/visitor present;with restraints reapplied  OT Visit Diagnosis: Unsteadiness on feet (R26.81);Other abnormalities of gait and mobility (R26.89);Muscle weakness (generalized) (M62.81);Low vision, both eyes (H54.2);Cognitive communication deficit (R41.841) Symptoms and signs involving cognitive functions: Cerebral infarction                Time: 7416-3845 OT Time Calculation (min): 33 min Charges:  OT General Charges $OT Visit: 1  Visit OT Evaluation $OT Eval Moderate Complexity: Steele, OTR/L Acute [employer] [facility] [phone] Office [phone]    Almon Register 07/19/2022, 3:24 PM

## 2022-07-19 NOTE — Evaluation (Signed)
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 Valetta Close   EYE SURGERY     FRACTURE SURGERY     GAS INSERTION Right 05/21/2021   Procedure: INSERTION OF GAS - C3F8;  Surgeon: Bernarda Caffey, MD;  Location: [place];  Service: Ophthalmology;  Laterality: Right;   GAS/FLUID EXCHANGE Right 05/21/2021   Procedure: GAS/FLUID EXCHANGE;  Surgeon: Bernarda Caffey, MD;  Location: [place];  Service: Ophthalmology;  Laterality: Right;   HERNIA REPAIR     IR ANGIO INTRA EXTRACRAN SEL INTERNAL CAROTID UNI R MOD SED  07/17/2022   IR PERCUTANEOUS ART THROMBECTOMY/INFUSION INTRACRANIAL INC DIAG ANGIO  07/17/2022   IR US GUIDE VASC ACCESS RIGHT  07/17/2022   PARS PLANA VITRECTOMY Right 05/21/2021   Procedure: PARS PLANA VITRECTOMY WITH 25 GAUGE;  Surgeon: Bernarda Caffey, MD;  Location: [place];  Service: Ophthalmology;  Laterality: Right;   PERFLUORONE INJECTION Right 05/21/2021   Procedure: PERFLUORONE INJECTION;  Surgeon: Bernarda Caffey, MD;  Location: [place];  Service: Ophthalmology;  Laterality: Right;   PHOTOCOAGULATION WITH LASER Right 05/21/2021   Procedure: PHOTOCOAGULATION WITH LASER;  Surgeon: Bernarda Caffey, MD;  Location: [place];  Service: Ophthalmology;  Laterality: Right;   RADIOLOGY WITH ANESTHESIA N/A 07/17/2022   Procedure: IR WITH ANESTHESIA;  Surgeon: Radiologist, Medication, MD;  Location: [place];  Service: Radiology;  Laterality: N/A;    RETINAL DETACHMENT SURGERY     HPI:  Douglas Cox is a 71 y.o. male who presented to the ED with acute onset of left hemiplegia, right gaze deviation and left hemineglect while working in his garage.  TNK given and recanalization acheived with thrombectomy.  Later at 1800 noted right sided weakness and new CT Head and CTA head and neck reordered and negative for reocclusion. MRI today shows bilateral basal ganglia infarcts. ASA 18m started 12/17  Pt with ETT for thrombectomy and overnight.  Extubated 12/17   Assessment / Plan / Recommendation Clinical Impression  Pt presents with a mild receptive and expressive language deficits.  Pt was assessed informally.  Pt answered yes/no questios with 90% accuracy.  There was one question to which pt did not repond.  Pt was noted to correct answers spontaneously and with repetition.  Pt followed 1 and 2 step commands for body movement accurately.  With 3 step command, pt completed initial portion only.   With confrontational naming pt was correct when he responded.  Pt responded to 5 targets with 4 of 5 accuracy and one unintelligible response.  After this pt became to drowsy to participate.  He did not attempt to participate in repetition task either.    Pt with very low volume speech and some articulatory imprecision.  With requests for repetition, speech clarity often improved.  It is unclear at this time if this is 2/2 lethargy/inattention or represents a dysarthria.    Cognition was not assessed 2/2 language and attention deficits.  Pt was able to state his first name and he said he was in hospital.     Pt would benefit from ongoing assessment in house and will likely benefit from [place] at next level of care.    SLP Assessment  SLP Recommendation/Assessment: Patient needs continued Speech Lanaguage Pathology Services SLP Visit Diagnosis: Aphasia (R47.01);Dysarthria and anarthria (R47.1)    Recommendations for follow up therapy are one  component of a multi-disciplinary discharge planning process, led by the attending physician.  Recommendations may be updated based on patient status, additional functional criteria and insurance authorization.    Follow Up Recommendations  Acute inpatient rehab (3hours/day)    Assistance Recommended at Discharge  Frequent or constant Supervision/Assistance  Functional Status Assessment Patient has had a recent decline in their functional status and demonstrates the ability to make significant improvements in function in a reasonable and predictable amount of time.  Frequency and Duration min 2x/week  2 weeks      SLP Evaluation Cognition  Overall Cognitive Status: Difficult to assess Orientation Level: Oriented to place;Oriented to person       Comprehension  Auditory Comprehension Yes/No Questions: Within Functional Limits Commands: Impaired One Step Basic Commands: 75-100% accurate Two Step Basic Commands: 75-100% accurate Multistep Basic Commands: 0-24% accurate Interfering Components: Other (comment) (drowsiness) Reading Comprehension Reading Status: Not tested    Expression Expression Primary Mode of Expression: Verbal Verbal Expression Overall Verbal Expression: Impaired Repetition: Impaired Level of Impairment: Word level Naming: Impairment Confrontation: Impaired Interfering Components: Attention (lethargy)   Oral / Motor  Motor Speech Intelligibility: Intelligibility reduced Word: 75-100% accurate            Celedonio Savage, MA, [place] Office: [phone] 07/19/2022, 10:37 AM

## 2022-07-20 ENCOUNTER — Inpatient Hospital Stay (HOSPITAL_COMMUNITY): Payer: Medicare Other

## 2022-07-20 LAB — COMPREHENSIVE METABOLIC PANEL
ALT: 14 U/L (ref 0–44)
AST: 27 U/L (ref 15–41)
Albumin: 2.6 g/dL — ABNORMAL LOW (ref 3.5–5.0)
Alkaline Phosphatase: 47 U/L (ref 38–126)
Anion gap: 8 (ref 5–15)
BUN: 12 mg/dL (ref 8–23)
CO2: 23 mmol/L (ref 22–32)
Calcium: 8.2 mg/dL — ABNORMAL LOW (ref 8.9–10.3)
Chloride: 110 mmol/L (ref 98–111)
Creatinine, Ser: 0.93 mg/dL (ref 0.61–1.24)
GFR, Estimated: >60 mL/min (ref >60)
Glucose, Bld: 115 mg/dL — ABNORMAL HIGH (ref 70–99)
Potassium: 3.9 mmol/L (ref 3.5–5.1)
Sodium: 141 mmol/L (ref 135–145)
Total Bilirubin: 0.8 mg/dL (ref 0.3–1.2)
Total Protein: 5.7 g/dL — ABNORMAL LOW (ref 6.5–8.1)

## 2022-07-20 LAB — GLUCOSE, CAPILLARY
Glucose-Capillary: 121 mg/dL — ABNORMAL HIGH (ref 70–99)
Glucose-Capillary: 114 mg/dL — ABNORMAL HIGH (ref 70–99)
Glucose-Capillary: 126 mg/dL — ABNORMAL HIGH (ref 70–99)
Glucose-Capillary: 101 mg/dL — ABNORMAL HIGH (ref 70–99)
Glucose-Capillary: 114 mg/dL — ABNORMAL HIGH (ref 70–99)

## 2022-07-20 LAB — CBC
HCT: 35.9 % — ABNORMAL LOW (ref 39.0–52.0)
Hemoglobin: 12 g/dL — ABNORMAL LOW (ref 13.0–17.0)
MCH: 33.9 pg (ref 26.0–34.0)
MCHC: 33.4 g/dL (ref 30.0–36.0)
MCV: 101.4 fL — ABNORMAL HIGH (ref 80.0–100.0)
Platelets: 181 10*3/uL (ref 150–400)
RBC: 3.54 MIL/uL — ABNORMAL LOW (ref 4.22–5.81)
RDW: 12.5 % (ref 11.5–15.5)
WBC: 7.6 10*3/uL (ref 4.0–10.5)
nRBC: 0 % (ref 0.0–0.2)

## 2022-07-20 NOTE — Progress Notes (Signed)
Inpatient Rehab Admissions Coordinator:  ? ?Per therapy recommendations,  patient was screened for CIR candidacy by Cristalle Rohm, MS, CCC-SLP. At this time, Pt. Appears to be a a potential candidate for CIR. I will place   order for rehab consult per protocol for full assessment. Please contact me any with questions. ? ?Durell Lofaso, MS, CCC-SLP ?Rehab Admissions Coordinator  ?[phone] (celll) ?[phone] (office) ? ?

## 2022-07-20 NOTE — Progress Notes (Addendum)
STROKE TEAM PROGRESS NOTE   INTERVAL HISTORY Patient is seen in his room with no family at the bedside.  He has been hemodynamically stable overnight and is able to follow commands more easily today.  Restraints remain in place due to continued attempts to get out of bed and pull tubes.  Will repeat swallow study in a day or 2 with hopes to discontinue core track.  Will transfer out of the ICU today.  Vitals:   07/20/22 1300 07/20/22 1400 07/20/22 1500 07/20/22 1600  BP: 137/68 126/71 127/62 (!) 148/79  Pulse: (!) 50  (!) 46 (!) 49  Resp: (!) 24 (!) 28 (!) 27 (!) 27  Temp:    98.9 F (37.2 C)  TempSrc:    Axillary  SpO2: 98% 99% 100% 100%  Weight:      Height:       CBC:  Recent Labs  Lab 07/17/22 1237 07/17/22 1238 07/19/22 0556 07/20/22 0505  WBC 4.0   < > 4.9 7.6  NEUTROABS 2.5  --   --   --   HGB 12.8*   < > 11.4* 12.0*  HCT 38.7*   < > 33.8* 35.9*  MCV 101.6*   < > 102.1* 101.4*  PLT 208   < > 172 181   < > = values in this interval not displayed.    Basic Metabolic Panel:  Recent Labs  Lab 07/19/22 0556 07/19/22 1625 07/20/22 0505  NA 140  --  141  K 3.7  --  3.9  CL 111  --  110  CO2 23  --  23  GLUCOSE 95  --  115*  BUN 8  --  12  CREATININE 0.88  --  0.93  CALCIUM 7.8*  --  8.2*  MG 2.0 1.9  --   PHOS 3.0 3.1  --     Lipid Panel:  Recent Labs  Lab 07/18/22 0335  CHOL 176  TRIG 241*  246*  HDL 44  CHOLHDL 4.0  VLDL 48*  LDLCALC 84    HgbA1c:  Recent Labs  Lab 07/18/22 1500  HGBA1C 5.7*    Urine Drug Screen: No results for input(s): "LABOPIA", "COCAINSCRNUR", "LABBENZ", "AMPHETMU", "THCU", "LABBARB" in the last 168 hours.  Alcohol Level  Recent Labs  Lab 07/17/22 1237  ETH <10     IMAGING past 24 hours DG CHEST PORT 1 VIEW  Result Date: 07/20/2022 CLINICAL DATA:  Aspiration.  COVID positive. EXAM: PORTABLE CHEST 1 VIEW COMPARISON:  July 17, 2022 FINDINGS: The ET tube has been removed in the interval. A feeding tube  terminates below today's film. No pneumothorax. The heart is borderline to mildly enlarged. The hila and mediastinum are unchanged. No pulmonary nodules or masses. No suspicious infiltrates. Mild atelectasis at the right base. No convincing evidence of aspiration. IMPRESSION: 1. Support apparatus as above. 2. Mild atelectasis at the right base. 3. No convincing evidence of aspiration. Electronically Signed   By: Dorise Bullion III M.D.   On: 07/20/2022 11:10    PHYSICAL EXAM  Physical Exam  Constitutional: Appears well-developed and well-nourished.   Cardiovascular: Normal rate and regular rhythm.  Respiratory: Effort normal, non-labored breathing  Neuro: Mental Status: Opens eyes spontaneously, more alert today, able to state name and follow simple commands Cranial Nerves: II: Visual Fields are full. Pupils are equal, round, and reactive to light.   III,IV, VI: Extraocular movements intact VIII: Hearing intact to voice XI: head is midline Motor: Tone is normal.  Bulk is normal. Able to follow commands with all 4 extremities  Sensory: Appears intact to light touch throughout    ASSESSMENT/PLAN Douglas Cox is a 71 y.o. male with history of cataracts, celiac disease and retinal detachment who presents to the ED with acute onset of left hemiplegia, right gaze deviation and left hemineglect while working in his garage, he does have an abrasion on his forehead but denied hitting his head in the garage.  TNK was given and he was emergently taken for a mechanical thrombectomy of right ICA terminus at the level of a fetal right PCA. Mechanical thrombectomy performedd with direct contact aspiration achieving completer recanalization (TICI3).  1800 noted right sided weakness and new CT Head and CTA head and neck reordered and negative for reocclusion. MRI shows bilateral basal ganglia infarcts. ASA 61m started 12/17 Patient is improving, but requires restraints due to continued attempts  to pull tubes and lines.  Will transfer out of the ICU today and hopefully retry swallow study in a few days.  Stroke:  Bilateral basal ganglia infarcts s/p TNK and MT of terminal right ICA occlusion with successful complete recanalization Etiology: Likely paroxysmal A-fib given history of it in the past .he is also COVID-positive code Stroke CT head Hyperdense right ICA terminus and proximal right M1 segment consistent with acute thrombus ASPECTS 7    CTA head & neck Occlusion of the right internal carotid artery at the level of the posterior communicating artery. Marked attenuation of a posterior right M2 branch which appears to fill, presumably from PL collateral vessels.  Repeat CT Head Evolving right MCA territory infarct appears to be limited to the right basal ganglia. Repeat CTA Head and Neck- Right internal carotid artery is reconstituted without focal stenosis or dissection through the ICA terminus. Right M1 segment and MCA bifurcations are now patent. Right MCA branch vessels are now revascularized. MRI  Acute/subacute nonhemorrhagic infarct of the right caudate head and lentiform nucleus as noted on the previous CT scan. Acute/subacute nonhemorrhagic infarct of the left caudate head and lentiform nucleus spanning the internal capsule. Acute/subacute nonhemorrhagic infarct of the cortex of the right temporal tip. Single punctate focus of restricted diffusion in the right precentral cortex. 2D Echo left ventricular ejection fraction 60 to 65%. LDL 84 HgbA1c 5.7. VTE prophylaxis -     Diet   Diet NPO time specified   No antithrombotic prior to admission, now on aspirin 81 mg daily. [place]  Therapy recommendations: Acute inpatient rehab Disposition:  Pending   Hypertension Home meds:   None Stable BP Goal keep systolic blood pressure less than 180 Long-term BP goal normotensive  Hyperlipidemia LDL 84, goal < 70 Add rosuvastatin 20 mg daily Continue statin  at discharge  Diabetes type II Controlled Home meds:  None HgbA1c 5.7, goal < 7.0 CBGs Recent Labs    07/20/22 0805 07/20/22 1134 07/20/22 1603  GLUCAP 114* 126* 101*     SSI  Other Stroke Risk Factors Advanced Age >/= 663  Other Active Problems Respiratory failure COVID positive- symptoms started on Tuesday 12/12 Intubated for procedure Extubated 12/17  [facility]  Patient seen and examined by NP/APP with MD. MD to update note as needed.   [place], MSN, AGACNP-BC [facility] See Amion for schedule and pager information 07/20/2022 4:22 PM   I have personally obtained history,examined this patient, reviewed notes, independently viewed imaging  studies, participated in medical decision making and plan of care.ROS completed by me personally and pertinent positives fully documented  I have made any additions or clarifications directly to the above note. Agree with note above.  Patient remains intermittently agitated requiring restraints but otherwise hemodynamically stable not in respiratory distress.  Continues to have dysphagia and has tube feeds.  Recommend transfer to neurology floor bed.  Continue ongoing therapies.  Likely need to go to rehab in few days.  No family available at the bedside.  Greater than 50% time during this 50-minute visit was spent in counseling and coordination of care about the stroke and discussion with care team and answering questions.  Antony Contras, MD Medical Director [facility] Pager: [phone] 07/20/2022 4:51 PM    To contact Stroke Continuity provider, please refer to [email or web address] After hours, contact General Neurology

## 2022-07-20 NOTE — Discharge Summary (Shared)
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 with successful complete recanalization  Principal Problem: Cryptogenic stroke (cerebrum) ([place]) Active Problems:   Protein-calorie malnutrition, severe COVID-positive Dysphagia   Medications to be continued on Rehab Allergies as of 07/22/2022       Reactions   Codone [hydrocodone] Hives   Gluten Meal Other (See Comments)   Stomach issues   Lactose Intolerance (gi)    GI issues   Nitrous Oxide    Can cause blindness in right eye    Tylenol [acetaminophen] Hives   Clarithromycin Anxiety        Medication List     STOP taking these medications    albuterol 108 (90 Base) MCG/ACT inhaler Commonly known as: VENTOLIN HFA   ALIGN PO   cholecalciferol 25 MCG (1000 UNIT) tablet Commonly known as: VITAMIN D3   Cyanocobalamin 1000 MCG/ML Kit   hydrocortisone 25 MG suppository Commonly known as: ANUSOL-HC   PRESCRIPTION MEDICATION   psyllium 58.6 % powder Commonly known as: METAMUCIL       TAKE these medications    aspirin EC 81 MG tablet Take 1 tablet (81 mg total) by mouth daily. Swallow whole. Start taking on: July 23, 2022   famotidine 20 MG tablet Commonly known as: PEPCID Place 1 tablet (20 mg total) into feeding tube 2 (two) times daily. What changed:  medication strength how much to take how to take this when to take this   QUEtiapine 25 MG tablet Commonly known as: SEROQUEL Take 0.5 tablets (12.5 mg total) by mouth 2 (two) times daily.   rosuvastatin 20 MG tablet Commonly known as: CRESTOR Place 1 tablet (20 mg total) into feeding tube daily. Start  taking on: July 23, 2022   sodium chloride 0.9 % infusion Inject 1,000 mLs into the vein continuous.        LABORATORY STUDIES CBC    Component Value Date/Time   WBC 7.6 07/20/2022 0505   RBC 3.54 (L) 07/20/2022 0505   HGB 12.0 (L) 07/20/2022 0505   HCT 35.9 (L) 07/20/2022 0505   PLT 181 07/20/2022 0505   MCV 101.4 (H) 07/20/2022 0505   MCH 33.9 07/20/2022 0505   MCHC 33.4 07/20/2022 0505   RDW 12.5 07/20/2022 0505   LYMPHSABS 1.0 07/17/2022 1237   MONOABS 0.4 07/17/2022 1237   EOSABS 0.1 07/17/2022 1237   BASOSABS 0.0 07/17/2022 1237   CMP    Component Value Date/Time   NA 141 07/20/2022 0505   K 3.9 07/20/2022 0505   CL 110 07/20/2022 0505   CO2 23 07/20/2022 0505   GLUCOSE 115 (H) 07/20/2022 0505   BUN 12 07/20/2022 0505   CREATININE 0.93 07/20/2022 0505   CALCIUM 8.2 (L) 07/20/2022 0505   PROT 5.7 (L) 07/20/2022 0505   ALBUMIN 2.6 (L) 07/20/2022 0505   AST 27 07/20/2022 0505   ALT 14 07/20/2022 0505   ALKPHOS 47 07/20/2022 0505   BILITOT 0.8 07/20/2022 0505   GFRNONAA >60 07/20/2022 0505   COAGS Lab Results  Component Value Date   INR 1.0 07/17/2022   Lipid Panel    Component Value Date/Time   CHOL 176 07/18/2022 0335  TRIG 241 (H) 07/18/2022 0335   TRIG 246 (H) 07/18/2022 0335   HDL 44 07/18/2022 0335   CHOLHDL 4.0 07/18/2022 0335   VLDL 48 (H) 07/18/2022 0335   LDLCALC 84 07/18/2022 0335   HgbA1C  Lab Results  Component Value Date   HGBA1C 5.7 (H) 07/18/2022   Urinalysis No results found for: "COLORURINE", "APPEARANCEUR", "LABSPEC", "PHURINE", "GLUCOSEU", "HGBUR", "BILIRUBINUR", "KETONESUR", "PROTEINUR", "UROBILINOGEN", "NITRITE", "LEUKOCYTESUR" Urine Drug Screen No results found for: "LABOPIA", "COCAINSCRNUR", "LABBENZ", "AMPHETMU", "THCU", "LABBARB"  Alcohol Level    Component Value Date/Time   ETH <10 07/17/2022 1237     SIGNIFICANT DIAGNOSTIC STUDIES DG CHEST PORT 1 VIEW  Result Date: 07/20/2022 CLINICAL DATA:   Aspiration.  COVID positive. EXAM: PORTABLE CHEST 1 VIEW COMPARISON:  July 17, 2022 FINDINGS: The ET tube has been removed in the interval. A feeding tube terminates below today's film. No pneumothorax. The heart is borderline to mildly enlarged. The hila and mediastinum are unchanged. No pulmonary nodules or masses. No suspicious infiltrates. Mild atelectasis at the right base. No convincing evidence of aspiration. IMPRESSION: 1. Support apparatus as above. 2. Mild atelectasis at the right base. 3. No convincing evidence of aspiration. Electronically Signed   By: Dorise Bullion III M.D.   On: 07/20/2022 11:10   DG Abd Portable 1V  Result Date: 07/19/2022 CLINICAL DATA:  Feeding tube placement. EXAM: PORTABLE ABDOMEN - 1 VIEW COMPARISON:  Abdominal radiograph yesterday. FINDINGS: Tip of the weighted enteric tube is just to the right of midline in the region of the distal stomach. Overlying artifact in place. Nonobstructive upper abdominal bowel gas pattern. IMPRESSION: Tip of the weighted enteric tube projecting over the distal stomach. Electronically Signed   By: Keith Rake M.D.   On: 07/19/2022 12:35   ECHOCARDIOGRAM COMPLETE  Result Date: 07/18/2022    ECHOCARDIOGRAM REPORT   Patient Name:   Douglas Cox Date of Exam: 07/18/2022 Medical Rec #:  [id number]         Height:       70.0 in Accession #:    [id number]        Weight:       156.1 lb Date of Birth:  08-16-1950         BSA:          1.879 m Patient Age:    71 years          BP:           113/57 mmHg Patient Gender: M                 HR:           73 bpm. Exam Location:  Inpatient Procedure: 2D Echo, Cardiac Doppler and Color Doppler Indications:    Stroke I63.9  History:        Patient has no prior history of Echocardiogram examinations.                 Stroke; Risk Factors:Former Smoker.  Sonographer:    Greer Pickerel Referring Phys: [phone] DENISE A WOLFE  Sonographer Comments: Image acquisition challenging due to uncooperative  patient, Image acquisition challenging due to respiratory motion and Image acquisition challenging due to patient body habitus. IMPRESSIONS  1. Left ventricular ejection fraction, by estimation, is 60 to 65%. The left ventricle has normal function. Left ventricular endocardial border not optimally defined to evaluate regional wall motion. Left ventricular diastolic function could not be evaluated.  2. Right ventricular systolic function is normal.  The right ventricular size is normal. Tricuspid regurgitation signal is inadequate for assessing PA pressure.  3. The mitral valve was not well visualized. No evidence of mitral valve regurgitation.  4. The aortic valve was not well visualized. Aortic valve regurgitation is not visualized. No aortic stenosis is present.  5. Limited study stopped early by patient. Comparison(s): No prior Echocardiogram. FINDINGS  Left Ventricle: Left ventricular ejection fraction, by estimation, is 60 to 65%. The left ventricle has normal function. Left ventricular endocardial border not optimally defined to evaluate regional wall motion. The left ventricular internal cavity size was small. There is no left ventricular hypertrophy. Left ventricular diastolic function could not be evaluated. Right Ventricle: The right ventricular size is normal. No increase in right ventricular wall thickness. Right ventricular systolic function is normal. Tricuspid regurgitation signal is inadequate for assessing PA pressure. Left Atrium: Left atrial size was normal in size. Right Atrium: Right atrial size was normal in size. Pericardium: There is no evidence of pericardial effusion. Mitral Valve: The mitral valve was not well visualized. No evidence of mitral valve regurgitation. Tricuspid Valve: The tricuspid valve is grossly normal. Tricuspid valve regurgitation is not demonstrated. Aortic Valve: The aortic valve was not well visualized. Aortic valve regurgitation is not visualized. No aortic stenosis is  present. Pulmonic Valve: The pulmonic valve was not well visualized. Pulmonic valve regurgitation is not visualized. Aorta: The aortic root is normal in size and structure. Venous: The inferior vena cava was not well visualized. IAS/Shunts: No atrial level shunt detected by color flow Doppler.  LEFT VENTRICLE PLAX 2D LVOT diam:     2.00 cm LVOT Area:     3.14 cm  LV Volumes (MOD) LV vol d, MOD A4C: 127.0 ml LV vol s, MOD A4C: 46.5 ml LV SV MOD A4C:     127.0 ml LEFT ATRIUM           Index LA Vol (A4C): 56.5 ml 30.08 ml/m   AORTA Ao Root diam: 3.20 cm MITRAL VALVE MV Area (PHT): 3.00 cm    SHUNTS MV Decel Time: 253 msec    Systemic Diam: 2.00 cm MV E velocity: 81.30 cm/s MV A velocity: 42.30 cm/s MV E/A ratio:  1.92 Rudean Haskell MD Electronically signed by Rudean Haskell MD Signature Date/Time: 07/18/2022/5:58:29 PM    Final    MR BRAIN WO CONTRAST  Result Date: 07/18/2022 CLINICAL DATA:  Stroke, follow-up. EXAM: MRI HEAD WITHOUT CONTRAST TECHNIQUE: Multiplanar, multiecho pulse sequences of the brain and surrounding structures were obtained without intravenous contrast. COMPARISON:  CT head without contrast 07/17/2022 FINDINGS: Brain: The diffusion-weighted images confirm acute/subacute nonhemorrhagic infarct of the right caudate head and lentiform nucleus. An acute nonhemorrhagic infarct is present in the left caudate head and lentiform nucleus spanning the internal capsule. There is more sparing of the inferior left caudate head on the left. A single punctate focus of restricted diffusion is present in the precentral cortex on image 90 of series 5, the axial diffusion-weighted sequence. This is confirmed on image 56 of series 7. Restricted diffusion is present in the cortex of the right temporal tip. No other significant peripheral cortical infarcts are present. The examination had to be discontinued prior to completion due to patient unable to tolerate further imaging and attempting to take  off the head coil. T2 and FLAIR hyperintensities are associated with the areas of acute/subacute infarction. No other significant white matter disease is present. The ventricles are of normal size. No significant extraaxial fluid collection is  present. The internal auditory canals are within normal limits. The brainstem and cerebellum are within normal limits. Vascular: Flow is present in the major intracranial arteries. Skull and upper cervical spine: The craniocervical junction is normal. Upper cervical spine is within normal limits. Marrow signal is unremarkable. Sinuses/Orbits: Fluid is present in the right sphenoid sinus. Mucosal thickening is present the anterior ethmoid air cells and inferior frontal sinus. Left mastoid effusion is present. The globes and orbits are within normal limits. Other: IMPRESSION: 1. Acute/subacute nonhemorrhagic infarct of the right caudate head and lentiform nucleus as noted on the previous CT scan. 2. Acute/subacute nonhemorrhagic infarct of the left caudate head and lentiform nucleus spanning the internal capsule. 3. Acute/subacute nonhemorrhagic infarct of the cortex of the right temporal tip. 4. Single punctate focus of restricted diffusion in the right precentral cortex. 5. Mild sinus disease. The above was relayed via text pager to [place] on 07/18/2022 at 13:20 . Electronically Signed   By: San Morelle M.D.   On: 07/18/2022 13:21   DG Abd 1 View  Result Date: 07/18/2022 CLINICAL DATA:  Tube placement EXAM: ABDOMEN - 1 VIEW COMPARISON:  None Available. FINDINGS: Upper abdomen image demonstrates a NG tube superimposed with the left upper quadrant. Only the upper portion of the abdomen was included. IMPRESSION: NG tube in place. Electronically Signed   By: Sammie Bench M.D.   On: 07/18/2022 09:28   CT ANGIO HEAD NECK W WO CM  Result Date: 07/17/2022 CLINICAL DATA:  Patient initially presented with left hemiplegia. Following revascularization of the  right ICA, the patient now has left hemiplegia. EXAM: CT ANGIOGRAPHY HEAD AND NECK TECHNIQUE: Multidetector CT imaging of the head and neck was performed using the standard protocol during bolus administration of intravenous contrast. Multiplanar CT image reconstructions and MIPs were obtained to evaluate the vascular anatomy. Carotid stenosis measurements (when applicable) are obtained utilizing NASCET criteria, using the distal internal carotid diameter as the denominator. RADIATION DOSE REDUCTION: This exam was performed according to the departmental dose-optimization program which includes automated exposure control, adjustment of the mA and/or kV according to patient size and/or use of iterative reconstruction technique. CONTRAST:  6m OMNIPAQUE IOHEXOL 350 MG/ML SOLN COMPARISON:  Cerebral arteriogram and CTA head and neck of the same day. FINDINGS: CTA NECK FINDINGS Aortic arch: Atherosclerotic changes at the left subclavian artery origin are stable. Right carotid system: Right common carotid artery is again within normal limits. Atherosclerotic calcifications at the right carotid bifurcation and proximal ICA are stable without significant stenosis or change. Left carotid system: Left common carotid artery is within normal limits. Bifurcation is unremarkable. Cervical left ICA is normal. Vertebral arteries: Left vertebral artery is dominant. No significant interval change. Skeleton: Stable appearance of the cervical spine. Other neck: Soft tissues the neck are otherwise unremarkable. Salivary glands are within normal limits. Thyroid is normal. No significant adenopathy is present. No focal mucosal or submucosal lesions are present. Upper chest: Emphysematous changes again noted. No significant interval change. Review of the MIP images confirms the above findings CTA HEAD FINDINGS Anterior circulation: The right internal carotid artery is reconstituted without focal stenosis or dissection through the ICA  terminus. The left internal carotid artery is stable through the ICA terminus. The right M1 segment scratched at the right M1 and A1 segment are now patent. Right M1 segment scratched at the right A1 segment is dominant. Left M1 segment is normal. The MCA bifurcations are now within normal limits bilaterally. Left MCA branch vessels  are stable. ACA branch vessels are stable. Right MCA branch vessels are now revascularized. No distal occlusion is present. Posterior circulation: Vertebral arteries and basilar artery normal. Left posterior cerebral artery originates from the basilar tip. Right posterior cerebral artery is of fetal type. Mild irregularity of distal PCA branch vessels bilaterally is stable. Venous sinuses: The dural sinuses are patent. The straight sinus and deep cerebral veins are intact. Cortical veins are within normal limits. No significant vascular malformation is evident. Anatomic variants: Fetal type right posterior cerebral artery. Review of the MIP images confirms the above findings IMPRESSION: 1. Right internal carotid artery is reconstituted without focal stenosis or dissection through the ICA terminus. 2. Right M1 segment and MCA bifurcations are now patent. 3. Right MCA branch vessels are now revascularized. 4. No new vessel occlusion involving the left anterior circulation. 5. Stable mild distal small vessel disease without significant proximal stenosis, aneurysm, or branch vessel occlusion within the Circle of Willis. 6. Stable atherosclerotic changes at the right carotid bifurcation and proximal ICA without significant stenosis or change. 7.  Aortic Atherosclerosis (ICD10-I70.0). Electronically Signed   By: San Morelle M.D.   On: 07/17/2022 19:25   IR PERCUTANEOUS ART THROMBECTOMY/INFUSION INTRACRANIAL INC DIAG ANGIO  Result Date: 07/17/2022 INDICATION: 71 year old male presenting with left-sided weakness, dysarthria and; NIHSS 15. His past medical history significant for  celiac disease retinal detachment and hemorrhoids. Head CT showed a hyperdense right ICA/MCA, hypodense right insular cortex and basal ganglia (ASPECTS 8). He received IV T NK. CT angiogram of the head and neck showed an occlusion of the intracranial right ICA at the level of the fetal right PCA extending into the proximal right A1 and along the entire M1/MCA segment. He was then transferred to our service for mechanical thrombectomy. EXAM: ULTRASOUND-GUIDED VASCULAR ACCESS DIAGNOSTIC CEREBRAL ANGIOGRAM MECHANICAL THROMBECTOMY FLAT PANEL HEAD CT COMPARISON:  CT/CT angiogram of the head and neck July 17, 2022. MEDICATIONS: 2 g of Ancef IV. The antibiotic was administered within 1 hour of the procedure ANESTHESIA/SEDATION: The procedure was performed under general anesthesia. CONTRAST:  40 mL of Omnipaque 300 milligram/mL FLUOROSCOPY: Radiation Exposure Index (as provided by the fluoroscopic device): 297 mGy Kerma COMPLICATIONS: None immediate. TECHNIQUE: Informed written consent was obtained from the patient's wife after a thorough discussion of the procedural risks, benefits and alternatives. All questions were addressed. Maximal Sterile Barrier Technique was utilized including caps, mask, sterile gowns, sterile gloves, sterile drape, hand hygiene and skin antiseptic. A timeout was performed prior to the initiation of the procedure. The right groin was prepped and draped in the usual sterile fashion. Using a micropuncture kit and the modified Seldinger technique, access was gained to the right common femoral artery and an 8 French sheath was placed. Real-time ultrasound guidance was utilized for vascular access including the acquisition of a permanent ultrasound image documenting patency of the accessed vessel. Under fluoroscopy, a Zoom 88 guide catheter was navigated over a 6 [place] VTK catheter and a 0.035" Terumo Glidewire into the aortic arch. The catheter was placed into the right common carotid artery and  then advanced into the right internal carotid artery. The diagnostic catheter was removed. Frontal and lateral angiograms of the head were obtained. FINDINGS: 1. Normal caliber of the right common femoral artery, adequate for vascular access. 2. 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 PROCEDURE: Using biplane roadmap guidance, a zoom 71 aspiration catheter was navigated over Colossus 35 microguidewire into the  cavernous segment of the right ICA. The aspiration catheter was then advanced to the level of occlusion and connected to an aspiration pump. Continuous aspiration was performed for 1.5 minutes. The guide catheter was connected to a VacLok syringe. The aspiration catheter was subsequently removed under constant aspiration. The guide catheter was aspirated for debris. Right internal carotid artery angiograms with frontal and lateral views of the head showed complete recanalization of the intracranial right ICA and right MCA vascular tree. And occlusion is noted at the distal right A1/ACA. Using biplane roadmap guidance, a zoom 71 aspiration catheter was navigated over Colossus 35 microguidewire into the cavernous segment of the right ICA. The aspiration catheter was then advanced to the level of occlusion in the distal right A1/ACA and connected to an aspiration pump. Continuous aspiration was performed for 2 minutes. The guide catheter was connected to a VacLok syringe. The aspiration catheter was subsequently removed under constant aspiration. The guide catheter was aspirated for debris. Right internal carotid angiograms with magnified frontal lateral views of the head showed complete recanalization of the right ACA vascular tree. Right internal carotid artery angiograms with full view of the head in frontal and lateral projection show no evidence of thromboembolic complication. Flat panel CT of the head was obtained and post processed in a separate  workstation with concurrent attending physician supervision. Selected images were sent to PACS. No evidence hemorrhagic complication. The catheter was subsequently withdrawn. Right common femoral artery angiogram was obtained in right anterior oblique view. The puncture is at the level of the common femoral artery. The artery has normal caliber, adequate for closure device. The sheath was exchanged over the wire for a Perclose prostyle which was utilized for access closure. Immediate hemostasis was achieved. IMPRESSION: 1. Successful mechanical thrombectomy for treatment of an intracranial right internal carotid artery occlusion achieving complete recanalization. 2. No thromboembolic or hemorrhagic complication. PLAN: Transfer to ICU for continued post stroke care. Electronically Signed   By: Pedro Earls M.D.   On: 07/17/2022 18:50   IR ANGIO INTRA EXTRACRAN SEL INTERNAL CAROTID UNI R MOD SED  Result Date: 07/17/2022 INDICATION: 71 year old male presenting with left-sided weakness, dysarthria and; NIHSS 15. His past medical history significant for celiac disease retinal detachment and hemorrhoids. Head CT showed a hyperdense right ICA/MCA, hypodense right insular cortex and basal ganglia (ASPECTS 8). He received IV T NK. CT angiogram of the head and neck showed an occlusion of the intracranial right ICA at the level of the fetal right PCA extending into the proximal right A1 and along the entire M1/MCA segment. He was then transferred to our service for mechanical thrombectomy. EXAM: ULTRASOUND-GUIDED VASCULAR ACCESS DIAGNOSTIC CEREBRAL ANGIOGRAM MECHANICAL THROMBECTOMY FLAT PANEL HEAD CT COMPARISON:  CT/CT angiogram of the head and neck July 17, 2022. MEDICATIONS: 2 g of Ancef IV. The antibiotic was administered within 1 hour of the procedure ANESTHESIA/SEDATION: The procedure was performed under general anesthesia. CONTRAST:  40 mL of Omnipaque 300 milligram/mL FLUOROSCOPY: Radiation  Exposure Index (as provided by the fluoroscopic device): 017 mGy Kerma COMPLICATIONS: None immediate. TECHNIQUE: Informed written consent was obtained from the patient's wife after a thorough discussion of the procedural risks, benefits and alternatives. All questions were addressed. Maximal Sterile Barrier Technique was utilized including caps, mask, sterile gowns, sterile gloves, sterile drape, hand hygiene and skin antiseptic. A timeout was performed prior to the initiation of the procedure. The right groin was prepped and draped in the usual sterile fashion. Using a micropuncture kit  and the modified Seldinger technique, access was gained to the right common femoral artery and an 8 French sheath was placed. Real-time ultrasound guidance was utilized for vascular access including the acquisition of a permanent ultrasound image documenting patency of the accessed vessel. Under fluoroscopy, a Zoom 88 guide catheter was navigated over a 6 [place] VTK catheter and a 0.035" Terumo Glidewire into the aortic arch. The catheter was placed into the right common carotid artery and then advanced into the right internal carotid artery. The diagnostic catheter was removed. Frontal and lateral angiograms of the head were obtained. FINDINGS: 1. Normal caliber of the right common femoral artery, adequate for vascular access. 2. 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 PROCEDURE: Using biplane roadmap guidance, a zoom 71 aspiration catheter was navigated over Colossus 35 microguidewire into the cavernous segment of the right ICA. The aspiration catheter was then advanced to the level of occlusion and connected to an aspiration pump. Continuous aspiration was performed for 1.5 minutes. The guide catheter was connected to a VacLok syringe. The aspiration catheter was subsequently removed under constant aspiration. The guide catheter was aspirated for debris. Right internal  carotid artery angiograms with frontal and lateral views of the head showed complete recanalization of the intracranial right ICA and right MCA vascular tree. And occlusion is noted at the distal right A1/ACA. Using biplane roadmap guidance, a zoom 71 aspiration catheter was navigated over Colossus 35 microguidewire into the cavernous segment of the right ICA. The aspiration catheter was then advanced to the level of occlusion in the distal right A1/ACA and connected to an aspiration pump. Continuous aspiration was performed for 2 minutes. The guide catheter was connected to a VacLok syringe. The aspiration catheter was subsequently removed under constant aspiration. The guide catheter was aspirated for debris. Right internal carotid angiograms with magnified frontal lateral views of the head showed complete recanalization of the right ACA vascular tree. Right internal carotid artery angiograms with full view of the head in frontal and lateral projection show no evidence of thromboembolic complication. Flat panel CT of the head was obtained and post processed in a separate workstation with concurrent attending physician supervision. Selected images were sent to PACS. No evidence hemorrhagic complication. The catheter was subsequently withdrawn. Right common femoral artery angiogram was obtained in right anterior oblique view. The puncture is at the level of the common femoral artery. The artery has normal caliber, adequate for closure device. The sheath was exchanged over the wire for a Perclose prostyle which was utilized for access closure. Immediate hemostasis was achieved. IMPRESSION: 1. Successful mechanical thrombectomy for treatment of an intracranial right internal carotid artery occlusion achieving complete recanalization. 2. No thromboembolic or hemorrhagic complication. PLAN: Transfer to ICU for continued post stroke care. Electronically Signed   By: Pedro Earls M.D.   On: 07/17/2022  18:50   IR US Guide Vasc Access Right  Result Date: 07/17/2022 INDICATION: 71 year old male presenting with left-sided weakness, dysarthria and; NIHSS 15. His past medical history significant for celiac disease retinal detachment and hemorrhoids. Head CT showed a hyperdense right ICA/MCA, hypodense right insular cortex and basal ganglia (ASPECTS 8). He received IV T NK. CT angiogram of the head and neck showed an occlusion of the intracranial right ICA at the level of the fetal right PCA extending into the proximal right A1 and along the entire M1/MCA segment. He was then transferred to our service for mechanical thrombectomy. EXAM:  ULTRASOUND-GUIDED VASCULAR ACCESS DIAGNOSTIC CEREBRAL ANGIOGRAM MECHANICAL THROMBECTOMY FLAT PANEL HEAD CT COMPARISON:  CT/CT angiogram of the head and neck July 17, 2022. MEDICATIONS: 2 g of Ancef IV. The antibiotic was administered within 1 hour of the procedure ANESTHESIA/SEDATION: The procedure was performed under general anesthesia. CONTRAST:  40 mL of Omnipaque 300 milligram/mL FLUOROSCOPY: Radiation Exposure Index (as provided by the fluoroscopic device): 975 mGy Kerma COMPLICATIONS: None immediate. TECHNIQUE: Informed written consent was obtained from the patient's wife after a thorough discussion of the procedural risks, benefits and alternatives. All questions were addressed. Maximal Sterile Barrier Technique was utilized including caps, mask, sterile gowns, sterile gloves, sterile drape, hand hygiene and skin antiseptic. A timeout was performed prior to the initiation of the procedure. The right groin was prepped and draped in the usual sterile fashion. Using a micropuncture kit and the modified Seldinger technique, access was gained to the right common femoral artery and an 8 French sheath was placed. Real-time ultrasound guidance was utilized for vascular access including the acquisition of a permanent ultrasound image documenting patency of the accessed vessel.  Under fluoroscopy, a Zoom 88 guide catheter was navigated over a 6 [place] VTK catheter and a 0.035" Terumo Glidewire into the aortic arch. The catheter was placed into the right common carotid artery and then advanced into the right internal carotid artery. The diagnostic catheter was removed. Frontal and lateral angiograms of the head were obtained. FINDINGS: 1. Normal caliber of the right common femoral artery, adequate for vascular access. 2. 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 PROCEDURE: Using biplane roadmap guidance, a zoom 71 aspiration catheter was navigated over Colossus 35 microguidewire into the cavernous segment of the right ICA. The aspiration catheter was then advanced to the level of occlusion and connected to an aspiration pump. Continuous aspiration was performed for 1.5 minutes. The guide catheter was connected to a VacLok syringe. The aspiration catheter was subsequently removed under constant aspiration. The guide catheter was aspirated for debris. Right internal carotid artery angiograms with frontal and lateral views of the head showed complete recanalization of the intracranial right ICA and right MCA vascular tree. And occlusion is noted at the distal right A1/ACA. Using biplane roadmap guidance, a zoom 71 aspiration catheter was navigated over Colossus 35 microguidewire into the cavernous segment of the right ICA. The aspiration catheter was then advanced to the level of occlusion in the distal right A1/ACA and connected to an aspiration pump. Continuous aspiration was performed for 2 minutes. The guide catheter was connected to a VacLok syringe. The aspiration catheter was subsequently removed under constant aspiration. The guide catheter was aspirated for debris. Right internal carotid angiograms with magnified frontal lateral views of the head showed complete recanalization of the right ACA vascular tree. Right internal  carotid artery angiograms with full view of the head in frontal and lateral projection show no evidence of thromboembolic complication. Flat panel CT of the head was obtained and post processed in a separate workstation with concurrent attending physician supervision. Selected images were sent to PACS. No evidence hemorrhagic complication. The catheter was subsequently withdrawn. Right common femoral artery angiogram was obtained in right anterior oblique view. The puncture is at the level of the common femoral artery. The artery has normal caliber, adequate for closure device. The sheath was exchanged over the wire for a Perclose prostyle which was utilized for access closure. Immediate hemostasis was achieved. IMPRESSION: 1. Successful mechanical thrombectomy for treatment  of an intracranial right internal carotid artery occlusion achieving complete recanalization. 2. No thromboembolic or hemorrhagic complication. PLAN: Transfer to ICU for continued post stroke care. Electronically Signed   By: Pedro Earls M.D.   On: 07/17/2022 18:50   CT HEAD WO CONTRAST (5MM)  Result Date: 07/17/2022 CLINICAL DATA:  Status post revascularization of right ICA occlusion EXAM: CT HEAD WITHOUT CONTRAST TECHNIQUE: Contiguous axial images were obtained from the base of the skull through the vertex without intravenous contrast. RADIATION DOSE REDUCTION: This exam was performed according to the departmental dose-optimization program which includes automated exposure control, adjustment of the mA and/or kV according to patient size and/or use of iterative reconstruction technique. COMPARISON:  CT head without contrast 07/17/22 at 12:36 p.m. FINDINGS: Brain: No acute hemorrhage is present. The right caudate head is now hypodense. Diffuse hypoattenuation is present within the right lentiform nucleus. Gray-white differentiation in the insular ribbon is improved. Gray-white differentiation at the right temporal tip is  improved as well. This may have been artifactual in the past. No new infarcts are present. Mild right-sided edema is present with slight asymmetric effacement of the sulci. No extra-axial hemorrhage is present. Vascular: Hyperdense vessels are present bilaterally following contrast. This is still slightly more prominent on the right. Skull: Calvarium is intact. Right supraorbital scalp soft tissue swelling is present. No underlying fracture is present. Sinuses/Orbits: Mild mucosal thickening is present throughout the ethmoid air cells and frontoethmoidal recess bilaterally. Secretions are present in the right sphenoid sinus. Bilateral lens replacements are noted. Globes and orbits are otherwise unremarkable. IMPRESSION: 1. Evolving right MCA territory infarct appears to be limited to the right basal ganglia. 2. No acute hemorrhage. 3. No expansion of the infarct. 4. Right supraorbital scalp soft tissue swelling without underlying fracture. Electronically Signed   By: San Morelle M.D.   On: 07/17/2022 18:01   DG CHEST PORT 1 VIEW  Result Date: 07/17/2022 CLINICAL DATA:  Intubation. EXAM: PORTABLE CHEST 1 VIEW COMPARISON:  Chest radiograph 03/25/2021 FINDINGS: Endotracheal tube tip projects 6.6 cm above the carina. Heart is normal in size. No focal consolidation, pleural effusion, or pneumothorax. IMPRESSION: Endotracheal tube tip projects 6.6 cm above the carina. No acute cardiopulmonary abnormality. Electronically Signed   By: Ileana Roup M.D.   On: 07/17/2022 16:08   CT C-SPINE NO CHARGE  Result Date: 07/17/2022 CLINICAL DATA:  Fall EXAM: CT CERVICAL SPINE WITHOUT CONTRAST TECHNIQUE: Multidetector CT imaging of the cervical spine was performed without intravenous contrast. Multiplanar CT image reconstructions were also generated. RADIATION DOSE REDUCTION: This exam was performed according to the departmental dose-optimization program which includes automated exposure control, adjustment of the  mA and/or kV according to patient size and/or use of iterative reconstruction technique. COMPARISON:  None Available. FINDINGS: Alignment: Facet joints are aligned without dislocation or traumatic listhesis. Dens and lateral masses are aligned. Skull base and vertebrae: No acute fracture. No primary bone lesion or focal pathologic process. Soft tissues and spinal canal: No prevertebral fluid or swelling. No visible canal hematoma. Disc levels: Moderate degenerative disc disease from C4-5 through C7-T1. Upper chest: Mild emphysematous changes at the lung apices. Other: None. IMPRESSION: 1. No acute fracture or traumatic listhesis of the cervical spine. 2. Moderate degenerative disc disease from C4-5 through C7-T1. Electronically Signed   By: Davina Poke D.O.   On: 07/17/2022 13:31   CT ANGIO HEAD NECK W WO CM (CODE STROKE)  Result Date: 07/17/2022 CLINICAL DATA:  Code stroke. Left-sided weakness. Right  MCA territory infarct. EXAM: CT ANGIOGRAPHY HEAD AND NECK TECHNIQUE: Multidetector CT imaging of the head and neck was performed using the standard protocol during bolus administration of intravenous contrast. Multiplanar CT image reconstructions and MIPs were obtained to evaluate the vascular anatomy. Carotid stenosis measurements (when applicable) are obtained utilizing NASCET criteria, using the distal internal carotid diameter as the denominator. RADIATION DOSE REDUCTION: This exam was performed according to the departmental dose-optimization program which includes automated exposure control, adjustment of the mA and/or kV according to patient size and/or use of iterative reconstruction technique. CONTRAST:  82m OMNIPAQUE IOHEXOL 350 MG/ML SOLN COMPARISON:  CT head without contrast 07/17/2022. FINDINGS: CTA NECK FINDINGS Aortic arch: Distal arch demonstrates mild calcification. Calcifications present the origin left subclavian artery without significant stenosis. The right scratched at the innominate  artery and left common carotid artery origins are not included on the study. Right carotid system: Right common carotid artery is within normal limits. Atherosclerotic calcifications are present at the bifurcation and proximal right ICA without a significant stenosis relative to the more distal vessel. Left carotid system: The left common carotid artery is within normal limits. Bifurcation is unremarkable. Cervical left ICA is normal. Vertebral arteries: The left vertebral artery is the dominant vessel. Both vertebral arteries originate from the subclavian arteries without significant stenosis. No significant stenosis is present in either vertebral artery in the neck. Skeleton: Typical degenerative changes are present in the cervical spine most evident C4-5, C5-6 and C6-7. No focal osseous lesions are present. Other neck: Soft tissues the neck are otherwise unremarkable. Salivary glands are within normal limits. Thyroid is normal. No significant adenopathy is present. No focal mucosal or submucosal lesions are present. Upper chest: Centrilobular and paraseptal emphysematous changes are present. No nodule or mass lesion is present. No significant airspace consolidation is present. Thoracic inlet is within normal limits. Review of the MIP images confirms the above findings CTA HEAD FINDINGS Anterior circulation: The left internal carotid artery is within normal limits the skull base to the ICA terminus. The right internal carotid artery is within normal limits in the petrous and cavernous segments. The artery is occluded at the level of the posterior communicating artery. The proximal scratched at the right M1 segment is occluded. Contrast is present in the right A1 segment which appears to be the dominant vessel to the level of the thrombus. A smaller left A1 segment is present. Anterior communicating artery is present. Both A2 segments fill normally. There is marked attenuation of a posterior right M2 branch which  appears to fill, presumably from PL collateral vessels. Other proximal MCA branches are not visualized. Left MCA branch vessels and bilateral ACA branch vessels are within normal limits proximally. There is some distal attenuation of branch vessels. Retrograde Posterior circulation: The left vertebral artery is dominant vessel. PICA origins are visualized and normal. Vertebrobasilar junction and basilar artery are patent. Basilar artery is small. Right posterior cerebral artery is of fetal type. Left posterior cerebral artery originates from the basilar tip. PCA branch vessels are within normal limits proximally. There is some attenuation of distal PCA branch vessels bilaterally. Venous sinuses: The dural sinuses are patent. The straight sinus and deep cerebral veins are intact. Cortical veins are within normal limits. No significant vascular malformation is evident. Anatomic variants: Fetal type right posterior cerebral artery Review of the MIP images confirms the above findings IMPRESSION: 1. 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 2. Marked attenuation of  a posterior right M2 branch which appears to fill, presumably from PL collateral vessels. 3. Contrast is present in the right A1 segment which appears to be the dominant vessel to the level of the thrombus. 4. A smaller left A1 segment is present. 5. Both A2 segments fill normally. 6. Mild distal small vessel disease without other significant proximal stenosis, aneurysm, or branch vessel occlusion within the Circle of Willis. 7. Atherosclerotic changes at the right carotid bifurcation and proximal right ICA without significant stenosis relative to the more distal vessel. 8. Fetal type right posterior cerebral artery. 9. Aortic Atherosclerosis (ICD10-I70.0) and Emphysema (ICD10-J43.9). Electronically Signed   By: San Morelle M.D.   On: 07/17/2022 13:19   CT HEAD CODE STROKE WO  CONTRAST  Result Date: 07/17/2022 CLINICAL DATA:  Code stroke. Neuro deficit, acute, stroke suspected. EXAM: CT HEAD WITHOUT CONTRAST TECHNIQUE: Contiguous axial images were obtained from the base of the skull through the vertex without intravenous contrast. RADIATION DOSE REDUCTION: This exam was performed according to the departmental dose-optimization program which includes automated exposure control, adjustment of the mA and/or kV according to patient size and/or use of iterative reconstruction technique. COMPARISON:  None Available. FINDINGS: Brain: Loss of gray-white differentiation noted in the right insular cortex and lateral aspect of the right lentiform nucleus. Right caudate head is intact. There is some indistinct no CIS of the posterior parietal cortex at the level of the basal ganglia. No super ganglionic cortical abnormalities are present. Loss of gray-white differentiation noted within the right temporal tip. No acute hemorrhage is present. The ventricles are of normal size. No significant extraaxial fluid collection is present. Insert normal brainstem Vascular: A hyperdense right ICA terminus and proximal right M1 segment noted. Skull: Calvarium is intact. No focal lytic or blastic lesions are present. No significant extracranial soft tissue lesion is present. Sinuses/Orbits: Mild mucosal thickening is present within anterior ethmoid air cells and inferior frontal sinuses bilaterally. No fluid levels are present. The paranasal sinuses and mastoid air cells are otherwise clear. Bilateral lens replacements are noted. Globes and orbits are otherwise unremarkable. Other: None ASPECTS ([place] Stroke Program Early CT Score) - Ganglionic level infarction (caudate, lentiform nuclei, internal capsule, insula, M1-M3 cortex): 4/7 - Supraganglionic infarction (M4-M6 cortex): 3/3 Total score (0-10 with 10 being normal): 7/10 IMPRESSION: 1. Loss of gray-white differentiation in the right insular cortex,  lateral aspect of the right lentiform nucleus, and right temporal tip consistent with acute infarct. 2. No acute hemorrhage. 3. Hyperdense right ICA terminus and proximal right M1 segment consistent with acute thrombus. 4. Aspects is 7/10. The above was relayed via text pager to [facility] on 07/17/2022 at 12:51 . Electronically Signed   By: San Morelle M.D.   On: 07/17/2022 12:51       HISTORY OF PRESENT ILLNESS 71 year old patient with a history of cataracts, celiac disease and retinal detachment presented with acute onset of left hemiplegia, right gaze deviation and left hemineglect.  HOSPITAL COURSE Patient's symptoms were found to be due to a stroke, and he was given TNK.  He was taken for emergent mechanical thrombectomy of right ICA terminus with TICI 3 flow achieved.  He was intubated for the procedure and extubated on 12/17.  On MRI, he was found to have infarcts to bilateral basal ganglia.  He failed his swallow study, and a cortrack was placed.  Patient is COVID-positive and was noted to have onset of symptoms on 12/12. Loop recorder to be placed today.  Stroke:  Bilateral basal ganglia infarcts s/p TNK and MT of terminal right ICA occlusion with successful complete recanalization Etiology: Likely paroxysmal A-fib given history of it in the past (no documentation however). he is also COVID-positive code Stroke CT head Hyperdense right ICA terminus and proximal right M1 segment consistent with acute thrombus ASPECTS 7    CTA head & neck Occlusion of the right internal carotid artery at the level of the posterior communicating artery. Marked attenuation of a posterior right M2 branch which appears to fill, presumably from PL collateral vessels.  Repeat CT Head Evolving right MCA territory infarct appears to be limited to the right basal ganglia. Repeat CTA Head and Neck- Right internal carotid artery is reconstituted without focal stenosis or dissection through the ICA terminus.  Right M1 segment and MCA bifurcations are now patent. Right MCA branch vessels are now revascularized. MRI  Acute/subacute nonhemorrhagic infarct of the right caudate head and lentiform nucleus as noted on the previous CT scan. Acute/subacute nonhemorrhagic infarct of the left caudate head and lentiform nucleus spanning the internal capsule. Acute/subacute nonhemorrhagic infarct of the cortex of the right temporal tip. Single punctate focus of restricted diffusion in the right precentral cortex. 2D Echo  LDL 84 HgbA1c 5.7. VTE prophylaxis - SCDs       Diet    Diet NPO time specified        No antithrombotic prior to admission, now on aspirin 81 mg daily Therapy recommendations: Acute inpatient rehab Disposition:  Pending    Hypertension Home meds:   None Stable BP Goal keep systolic blood pressure less than 180 Long-term BP goal normotensive   Hyperlipidemia LDL 84, goal < 70 Add rosuvastatin 20 mg daily Continue statin at discharge   Diabetes type II Controlled Home meds:  None HgbA1c 5.7, goal < 7.0 CBGs Recent Labs (last 2 labs)       Recent Labs    07/19/22 0030 07/19/22 0522 07/19/22 1145  GLUCAP 93 95 91       SSI   Other Stroke Risk Factors Advanced Age >/= 71    Other Active Problems Respiratory failure COVID positive- symptoms started on Tuesday 12/12 -> can remove from isolation on 12/22 Intubated for procedure Extubated 12/17  CT value of 27  DISCHARGE EXAM Blood pressure 133/87, pulse (!) 53, temperature 98.6 F (37 C), temperature source Temporal, resp. rate 18, height _0  (1.778 m), weight 71.3 kg, SpO2 95 %. Constitutional: Appears well-developed and well-nourished.   Cardiovascular: Normal rate and regular rhythm.  Respiratory: Effort normal, non-labored breathing   Neuro: Mental Status: Opens eyes spontaneously, more alert today, able to state name and follow simple commands Cranial Nerves: II: Visual Fields are full. Pupils are  equal, round, and reactive to light.   III,IV, VI: Extraocular movements intact VIII: Hearing intact to voice XI: head is midline Motor: Tone is normal. Bulk is normal. Able to follow commands with all 4 extremities  Sensory: Appears intact to light touch throughout  Discharge Diet      Diet   Diet NPO time specified   liquids  DISCHARGE PLAN Disposition:  Transfer to [facility] for ongoing PT, OT and ST aspirin 81 mg daily for secondary stroke prevention Recommend ongoing stroke risk factor control by Primary Care Physician at time of discharge from inpatient rehabilitation. Follow-up PCP Pcp, No in 2 weeks following discharge from rehab. Follow-up in [facility] with Dr. Leonie Man in 8 weeks following discharge  from rehab, office to schedule an appointment.  Outpatient follow-up with cardiology for 30-day heart monitoring or loop recorder  35 minutes were spent preparing discharge.  Patient seen and examined by NP/APP with MD. MD to update note as needed.   Janine Ores, DNP, FNP-BC [facility] Pager: [phone]  I have personally obtained history,examined this patient, reviewed notes, independently viewed imaging studies, participated in medical decision making and plan of care.ROS completed by me personally and pertinent positives fully documented  I have made any additions or clarifications directly to the above note. Agree with note above.    Antony Contras, MD Medical Director [facility] Pager: [phone] 07/22/2022 5:16 PM

## 2022-07-20 NOTE — Progress Notes (Signed)
Physical Therapy Treatment Patient Details Name: Douglas Cox MRN: [id number] DOB: 05-Feb-1951 Today's Date: 07/20/2022   History of Present Illness Douglas Cox is an 71 y.o. 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 PMHx of cataracts, celiac disease and retinal detachment    PT Comments    Pt restless upon PT arrival, in waist and wrist restraints. Pt moves impulsively and requires hands-on assist 100% of the session, limited command following secondary to receptive and expressive difficulties. Pt overall requiring min-mod physical assist for short-distance gait in room, pt limited by fatigue, impaired balance with posterior LOB, and urinary incontinence which distressed pt. PT to continue to follow acutely.     Recommendations for follow up therapy are one component of a multi-disciplinary discharge planning process, led by the attending physician.  Recommendations may be updated based on patient status, additional functional criteria and insurance authorization.  Follow Up Recommendations  Acute inpatient rehab (3hours/day)     Assistance Recommended at Discharge Frequent or constant Supervision/Assistance  Patient can return home with the following A little help with walking and/or transfers;A little help with bathing/dressing/bathroom;Assistance with cooking/housework;Assist for transportation;Help with stairs or ramp for entrance   Equipment Recommendations  Other (comment)    Recommendations for Other Services       Precautions / Restrictions  Precautions Precautions: Fall Precaution Comments: Bil wrist restraints, Bil Mitts, posey Restrictions Weight Bearing Restrictions: No     Mobility  Bed Mobility Overal bed mobility: Needs Assistance Bed Mobility: Supine to Sit, Sit to Supine     Supine to sit: Min assist, HOB elevated Sit to supine: Mod assist, HOB elevated   General bed mobility comments: assist for trunk rise, LE lifting back into bed, and boost up with bed pads.    Transfers Overall transfer level: Needs assistance Equipment used: Rolling walker (2 wheels) Transfers: Sit to/from Stand Sit to Stand: Min assist           General transfer comment: assist to rise and steady, lines/leads management    Ambulation/Gait Ambulation/Gait assistance: Min assist Gait Distance (Feet): 30 Feet Assistive device: Rolling walker (2 wheels) Gait Pattern/deviations: Step-through pattern, Decreased stride length, Trunk flexed Gait velocity: decr     General Gait Details: assist to steady, correct x1 posterior bias, and RW management. Pt urinary incontinent during gait and becoming frantic, tries to impulsively race back to bed   Stairs             Wheelchair Mobility    Modified Rankin (Stroke Patients Only) Modified Rankin (Stroke Patients Only) Pre-Morbid Rankin Score: No symptoms Modified Rankin: Moderately severe disability     Balance Overall balance assessment: Needs assistance Sitting-balance support: No upper extremity supported, Feet supported Sitting balance-Leahy Scale: Fair     Standing balance support: Single extremity supported Standing balance-Leahy Scale: Poor                              Cognition Arousal/Alertness: Awake/alert Behavior During Therapy: Restless, Impulsive Overall Cognitive Status: Difficult to assess  General Comments: limited command following given expressive and receptive difficulties, very  impulsive requiring PT hands-on at all times. Pt restless and reaching for lines throughout session        Exercises      General Comments        Pertinent Vitals/Pain Pain Assessment Pain Assessment: Faces Faces Pain Scale: No hurt Pain Intervention(s): Monitored during session    Home Living                          Prior Function            PT Goals (current goals can now be found in the care plan section) Acute Rehab PT Goals Patient Stated Goal: home independent PT Goal Formulation: With patient/family Time For Goal Achievement: 08/02/22 Potential to Achieve Goals: Good Progress towards PT goals: Progressing toward goals    Frequency    Min 3X/week      PT Plan Current plan remains appropriate    Co-evaluation              AM-PAC PT "6 Clicks" Mobility   Outcome Measure  Help needed turning from your back to your side while in a flat bed without using bedrails?: A Little Help needed moving from lying on your back to sitting on the side of a flat bed without using bedrails?: A Little Help needed moving to and from a bed to a chair (including a wheelchair)?: A Lot Help needed standing up from a chair using your arms (e.g., wheelchair or bedside chair)?: A Lot Help needed to walk in hospital room?: A Lot Help needed climbing 3-5 steps with a railing? : Total 6 Click Score: 13    End of Session   Activity Tolerance: Patient tolerated treatment well Patient left: in bed;with call bell/phone within reach;with bed alarm set;with restraints reapplied Nurse Communication: Mobility status PT Visit Diagnosis: Unsteadiness on feet (R26.81);Other abnormalities of gait and mobility (R26.89);Other symptoms and signs involving the nervous system (R29.898)     Time: 7711-6579 PT Time Calculation (min) (ACUTE ONLY): 30 min  Charges:  $Gait Training: 8-22 mins $Therapeutic Activity: 8-22 mins                     Stacie Glaze, PT DPT Acute  Rehabilitation Services Pager [phone]  Office [phone]    [place] 07/20/2022, 5:52 PM

## 2022-07-20 NOTE — TOC CAGE-AID Note (Signed)
Transition of Care [facility]) - CAGE-AID Screening   Patient Details  Name: Bolden Hagerman MRN: [id number] Date of Birth: 10-Dec-1950  Transition of [facility]) CM/SW Contact:    Benard Halsted, [place] Phone Number: 07/20/2022, 6:01 PM   Clinical Narrative: Patient currently disoriented and unable to participate in screening.    CAGE-AID Screening: Substance Abuse Screening unable to be completed due to: : Patient unable to participate

## 2022-07-21 ENCOUNTER — Encounter: Payer: Medicare Other | Admitting: Gastroenterology

## 2022-07-21 ENCOUNTER — Encounter (HOSPITAL_COMMUNITY): Payer: Self-pay | Admitting: Neurology

## 2022-07-21 LAB — GLUCOSE, CAPILLARY
Glucose-Capillary: 107 mg/dL — ABNORMAL HIGH (ref 70–99)
Glucose-Capillary: 113 mg/dL — ABNORMAL HIGH (ref 70–99)
Glucose-Capillary: 121 mg/dL — ABNORMAL HIGH (ref 70–99)
Glucose-Capillary: 123 mg/dL — ABNORMAL HIGH (ref 70–99)
Glucose-Capillary: 132 mg/dL — ABNORMAL HIGH (ref 70–99)

## 2022-07-21 MED ORDER — QUETIAPINE FUMARATE 25 MG PO TABS
12.5000 mg | ORAL_TABLET | Freq: Two times a day (BID) | ORAL | Status: DC
  Administered 2022-07-21 – 2022-07-22 (×3): 12.5 mg via ORAL
  Filled 2022-07-21 (×3): qty 1

## 2022-07-21 NOTE — Progress Notes (Signed)
Inpatient Rehab Coordinator Note:  I spoke with pt's spouse, [name], over the phone to discuss CIR recommendations and goals/expectations of CIR stay.  We reviewed 3 hrs/day of therapy, physician follow up, and average length of stay 2 weeks (dependent upon progress) with goals of supervision to modified independent.  [name] in agreement with expectations.  I will start insurance auth request today.    Shann Medal, PT, DPT Admissions Coordinator [phone] 07/21/22  2:43 PM

## 2022-07-21 NOTE — PMR Pre-admission (Signed)
PMR Admission Coordinator Pre-Admission Assessment  Patient: Douglas Cox is an 71 y.o., male MRN: [id number] DOB: 04-Dec-1950 Height: _0  (177.8 cm) Weight: 71.3 kg  Insurance Information HMO: yes    PPO:      PCP:      IPA:      80/20:      OTHER:  PRIMARY: BCBS Medicare      Policy#: [id number]      Subscriber: pt CM Name: Larene Beach      Phone#: [phone]     Fax#: [phone] Pre-Cert#: tbd on admit      Employer:  Benefits:  Phone #: [phone]     Name:  Eff. Date: 08/02/21     Deduct: $0      Out of Pocket Max: $3950 (met $289.40)      Life Max:  CIR: $335/day for days 1-5      SNF: 20 full days Outpatient:      Co-Pay: $10/visit Home Health: 100%      Co-Pay:  DME: 80%     Co-Pay: 20% Providers:  SECONDARY:       Policy#:      Phone#:   Development worker, community:       Phone#:   The Therapist, art Information Summary" for patients in Inpatient Rehabilitation Facilities with attached "Privacy Act [place] Records" was provided and verbally reviewed with: Family  Emergency Contact Information Contact Information     Name Relation Home Work [place] Spouse   [phone]       Current Medical History  Patient Admitting Diagnosis: CVA   History of Present Illness: Douglas Cox is a 71 year old right-handed male with history of celiac disease and retinal detachment, quit smoking 16 years ago, HTN, DM.  Presented 07/17/2022 with acute onset of left-sided weakness as well as right frontal headache.  Cranial CT scan showed loss of gray-white differentiation in the right insular cortex lateral aspect of right lentiform nucleus, and right temporal tip consistent with acute infarction.  No acute hemorrhage.  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  Status post TNK as well as recanalization per interventional radiology.  MRI follow-up showed acute/subacute  nonhemorrhagic infarct of the right caudate head and lentiform nucleus as noted on previous CT.  Echocardiogram with ejection fraction of 60 to 65%.  Admission chemistries unremarkable except glucose 110, hemoglobin A1c 5.7, SARS coronavirus positive.  Currently maintained on aspirin for CVA prophylaxis.  Awaiting plan for loop recorder.  Airborne/contact precautions for positive COVID, through 12/22.  Patient is currently n.p.o. with alternative means of nutritional support.  Placed on Unasyn suspect possible pneumonia, though latest chest x-ray 07/20/2022 showed no convincing evidence of aspiration.  No suspicious infiltrates.  Bouts of agitation and restlessness, so placed on Seroquel.  Therapy evaluations completed and pt was recommended for a comprehensive rehab program.   Complete NIHSS TOTAL: 6  Patient's medical record from [facility] has been reviewed by the rehabilitation admission coordinator and physician.  Past Medical History  Past Medical History:  Diagnosis Date   Celiac disease    Retinal detachment     Has the patient had major surgery during 100 days prior to admission? Yes  Family History   family history is not on file.  Current Medications  Current Facility-Administered Medications:    Ampicillin-Sulbactam (UNASYN) 3 g in sodium chloride 0.9 % 100 mL IVPB, 3 g, Intravenous, Q6H, Oriet,  [facility], Stopped at 07/22/22 6237   aspirin EC tablet 81 mg, 81 mg, Oral, Daily **OR** aspirin suppository 300 mg, 300 mg, Rectal, Daily **OR** aspirin chewable tablet 81 mg, 81 mg, Per Tube, Daily, Garvin Fila, MD, 81 mg at 07/21/22 0830   famotidine (PEPCID) tablet 20 mg, 20 mg, Per Tube, BID, Ollis, Brandi L, NP, 20 mg at 07/21/22 2154   feeding supplement (OSMOLITE 1.5 CAL) liquid 1,000 mL, 1,000 mL, Per Tube, Continuous, Garvin Fila, MD, Stopped at 07/22/22 0401   fentaNYL (SUBLIMAZE) injection 50 mcg, 50 mcg, Intravenous, Q2H PRN, Eliezer Bottom T, MD, 50 mcg at  07/20/22 0144   fiber supplement (BANATROL TF) liquid 60 mL, 60 mL, Per Tube, BID, Garvin Fila, MD, 60 mL at 07/21/22 2154   hydrALAZINE (APRESOLINE) injection 10 mg, 10 mg, Intravenous, Q4H PRN, Eliezer Bottom T, MD, 10 mg at 07/17/22 2307   labetalol (NORMODYNE) injection 10 mg, 10 mg, Intravenous, Q2H PRN, Eliezer Bottom T, MD, 10 mg at 07/18/22 0019   mupirocin cream (BACTROBAN) 2 %, , Topical, BID, Garvin Fila, MD, Given at 07/21/22 2153   ondansetron (ZOFRAN) injection 4 mg, 4 mg, Intravenous, Q8H PRN, Ollis, Brandi L, NP   Oral care mouth rinse, 15 mL, Mouth Rinse, PRN, Leonie Man, Pramod S, MD   polyethylene glycol (MIRALAX / GLYCOLAX) packet 17 g, 17 g, Per Tube, Daily, Ollis, Brandi L, NP, 17 g at 07/21/22 1304   QUEtiapine (SEROQUEL) tablet 12.5 mg, 12.5 mg, Oral, BID, Shafer, Devon, NP, 12.5 mg at 07/21/22 2154   rosuvastatin (CRESTOR) tablet 20 mg, 20 mg, Per Tube, Daily, de Yolanda Manges, [place] E, NP, 20 mg at 07/21/22 0830   senna-docusate (Senokot-S) tablet 1 tablet, 1 tablet, Per Tube, BID, Wilson Singer I, RPH, 1 tablet at 07/21/22 2154  Patients Current Diet:  Diet Order             Diet NPO time specified  Diet effective now                   Precautions / Restrictions Precautions Precautions: Fall Precaution Comments: waist restraint Restrictions Weight Bearing Restrictions: No   Has the patient had 2 or more falls or a fall with injury in the past year? Unknown and pt with abrasion to forehead on admission but denies falling.   Prior Activity Level Community (5-7x/wk): fully independent, driving, no DME used, likes to hike  Prior Functional Level Self Care: Did the patient need help bathing, dressing, using the toilet or eating? Independent  Indoor Mobility: Did the patient need assistance with walking from room to room (with or without device)? Independent  Stairs: Did the patient need assistance with internal or external stairs (with or without  device)? Independent  Functional Cognition: Did the patient need help planning regular tasks such as shopping or remembering to take medications? Independent  Patient Information Are you of Hispanic, Latino/a,or Spanish origin?: A. No, not of Hispanic, Latino/a, or Spanish origin What is your race?: A. White Do you need or want an interpreter to communicate with a doctor or health care staff?: 0. No  Patient's Response To:  Health Literacy and Transportation Is the patient able to respond to health literacy and transportation needs?: No Health Literacy - How often do you need to have someone help you when you read instructions, pamphlets, or other written material from your doctor or pharmacy?: Patient unable to respond In the past 12 months, has lack of  transportation kept you from medical appointments or from getting medications?: No In the past 12 months, has lack of transportation kept you from meetings, work, or from getting things needed for daily living?: No  Development worker, international aid / [place] Devices/Equipment: None Home Equipment: None  Prior Device Use: Indicate devices/aids used by the patient prior to current illness, exacerbation or injury? None of the above  Current Functional Level Cognition  Overall Cognitive Status: Impaired/Different from baseline Difficult to assess due to: Impaired communication Orientation Level: Disoriented to place, Disoriented to time, Disoriented to situation, Oriented to person General Comments: pt awake and alert qwith flat affect making minimal conversation and minimal verbalizations, pt following commands with increased time and repetition    Extremity Assessment (includes Sensation/Coordination)  Upper Extremity Assessment: Defer to OT evaluation       ADLs  Overall ADL's : Needs assistance/impaired Eating/Feeding: Total assistance, NPO Eating/Feeding Details (indicate cue type and reason): cortrak Grooming: Maximal  assistance, Sitting Upper Body Bathing: Maximal assistance, Sitting Lower Body Bathing: Maximal assistance Lower Body Bathing Details (indicate cue type and reason): min A sit<>stand Upper Body Dressing : Maximal assistance, Sitting Lower Body Dressing: Maximal assistance Lower Body Dressing Details (indicate cue type and reason): min A sit<>stand Toilet Transfer: Minimal assistance, +2 for safety/equipment Toilet Transfer Details (indicate cue type and reason): bed>window>door>bed Toileting- Clothing Manipulation and Hygiene: Maximal assistance Toileting - Clothing Manipulation Details (indicate cue type and reason): min A  sit<>stand    Mobility  Overal bed mobility: Needs Assistance Bed Mobility: Supine to Sit, Sit to Supine Supine to sit: Min assist Sit to supine: Supervision General bed mobility comments: min assist to elevate trunk    Transfers  Overall transfer level: Needs assistance Equipment used: Rolling walker (2 wheels) Transfers: Sit to/from Stand Sit to Stand: Min assist General transfer comment: assist to rise and steady, lines/leads management    Ambulation / Gait / Stairs / Wheelchair Mobility  Ambulation/Gait Ambulation/Gait assistance: Herbalist (Feet): 96 Feet Assistive device: Rolling walker (2 wheels) Gait Pattern/deviations: Step-through pattern, Decreased stride length, Trunk flexed General Gait Details: assist to steady, no LOB, laps in room with pt trying to open door each time aproaching despite repeated cues and reminders to stay in room, hands on management of RW ofor directions Gait velocity: decr Gait velocity interpretation: <1.31 ft/sec, indicative of household ambulator    Posture / Balance Balance Overall balance assessment: Needs assistance Sitting-balance support: No upper extremity supported, Feet supported Sitting balance-Leahy Scale: Fair Standing balance support: Single extremity supported Standing balance-Leahy Scale:  Poor    Special needs/care consideration Diabetic management yes   Previous Home Environment (from acute therapy documentation) Living Arrangements: Spouse/significant other  Lives With: Spouse Available Help at Discharge: Family, Available 24 hours/day Type of Home: House Home Layout: Two level, 1/2 bath on main level, Bed/bath upstairs Alternate Level Stairs-Rails: Left Alternate Level Stairs-Number of Steps: flight Home Access: Stairs to enter Entrance Stairs-Rails: None Entrance Stairs-Number of Steps: 3 Bathroom Shower/Tub: Optometrist: Yes How Accessible: Accessible via walker Home Care Services: No Additional Comments: full bath on second floor  Discharge Living Setting Plans for Discharge Living Setting: Patient's home, Lives with (comment) (spouse) Type of Home at Discharge: House Discharge Home Layout: Two level, 1/2 bath on main level Alternate Level Stairs-Rails: Left Alternate Level Stairs-Number of Steps: full flight Discharge Home Access: Stairs to enter Entrance Stairs-Number of Steps: 3 Discharge Bathroom Shower/Tub: Tub/shower unit  Discharge Bathroom Toilet: Standard Discharge Bathroom Accessibility: Yes How Accessible: Accessible via walker Does the patient have any problems obtaining your medications?: No  Social/Family/Support Systems Patient Roles: Spouse Anticipated Caregiver: spouse, Leonarda Salon Anticipated Caregiver's Contact Information: [phone] Ability/Limitations of Caregiver: n/a Caregiver Availability: 24/7 Discharge Plan Discussed with Primary Caregiver: Yes Is Caregiver In Agreement with Plan?: Yes Does Caregiver/Family have Issues with Lodging/Transportation while Pt is in Rehab?: No  Goals Patient/Family Goal for Rehab: PT/OT/SLP supervision to mod I Expected length of stay: 12-14 days Additional Information: MBSS pending Pt/Family Agrees to Admission and willing to  participate: Yes Program Orientation Provided & Reviewed with Pt/Caregiver Including Roles  & Responsibilities: Yes  Barriers to Discharge: Insurance for SNF coverage  Decrease burden of Care through IP rehab admission: n/a  Possible need for SNF placement upon discharge: Not anticipated.   Patient Condition: I have reviewed medical records from [place], spoken with  [facility] team , and patient. I discussed via phone for inpatient rehabilitation assessment.  Patient will benefit from ongoing PT, OT, and SLP, can actively participate in 3 hours of therapy a day 5 days of the week, and can make measurable gains during the admission.  Patient will also benefit from the coordinated team approach during an Inpatient Acute Rehabilitation admission.  The patient will receive intensive therapy as well as Rehabilitation physician, nursing, social worker, and care management interventions.  Due to bladder management, bowel management, safety, skin/wound care, disease management, medication administration, pain management, and patient education the patient requires 24 hour a day rehabilitation nursing.  The patient is currently min to mod assist with mobility and basic ADLs.  Discharge setting and therapy post discharge at home with home health is anticipated.  Patient has agreed to participate in the Acute Inpatient Rehabilitation Program and will admit today.  Preadmission Screen Completed By:  Michel Santee, PT, DPT 07/22/2022 10:16 AM ______________________________________________________________________   Discussed status with Dr. Curlene Dolphin on 07/22/22 at 10:16 AM and received approval for admission today.  Admission Coordinator:  Michel Santee, PT, DPT time 10:16 AM/Date 07/22/22    Assessment/Plan: Diagnosis: bilateral basal ganglia infarctions  Does the need for close, 24 hr/day Medical supervision in concert with the patient's rehab needs make it unreasonable for this patient to be served in a  less intensive setting? Yes Co-Morbidities requiring supervision/potential complications: COVID, Dysphagia, HLD Due to bladder management, bowel management, safety, skin/wound care, disease management, medication administration, pain management, and patient education, does the patient require 24 hr/day rehab nursing? Yes Does the patient require coordinated care of a physician, rehab nurse, PT, OT, and SLP to address physical and functional deficits in the context of the above medical diagnosis(es)? Yes Addressing deficits in the following areas: balance, endurance, locomotion, strength, transferring, bowel/bladder control, bathing, dressing, feeding, grooming, toileting, cognition, speech, language, swallowing, and psychosocial support Can the patient actively participate in an intensive therapy program of at least 3 hrs of therapy 5 days a week? Yes The potential for patient to make measurable gains while on inpatient rehab is excellent Anticipated functional outcomes upon discharge from inpatient rehab: modified independent and supervision PT, modified independent and supervision OT, modified independent and supervision SLP Estimated rehab length of stay to reach the above functional goals is: 12-14 days Anticipated discharge destination: Home 10. Overall Rehab/Functional Prognosis: excellent   MD Signature: Jennye Boroughs

## 2022-07-21 NOTE — Progress Notes (Addendum)
STROKE TEAM PROGRESS NOTE   INTERVAL HISTORY Patient is seen in his room with his wife at the bedside.  He has been hemodynamically stable overnight and is able to follow commands more easily today.  CIR consult in place. Loop recorder needed prior to discharge.  In bilateral wrist restraints. Does attempt to pull out cortrak while examiner is at the bedside.   Vitals:   07/20/22 2200 07/20/22 2258 07/21/22 0359 07/21/22 0539  BP: (!) 156/128 (!) 120/92 109/63   Pulse: (!) 46 (!) 49 (!) 44   Resp: (!) 29 20 20    Temp:  98.3 F (36.8 C) 98.7 F (37.1 C)   TempSrc:  Oral    SpO2: 100% 100% 96%   Weight:    71.3 kg  Height:       CBC:  Recent Labs  Lab 07/17/22 1237 07/17/22 1238 07/19/22 0556 07/20/22 0505  WBC 4.0   < > 4.9 7.6  NEUTROABS 2.5  --   --   --   HGB 12.8*   < > 11.4* 12.0*  HCT 38.7*   < > 33.8* 35.9*  MCV 101.6*   < > 102.1* 101.4*  PLT 208   < > 172 181   < > = values in this interval not displayed.    Basic Metabolic Panel:  Recent Labs  Lab 07/19/22 0556 07/19/22 1625 07/20/22 0505  NA 140  --  141  K 3.7  --  3.9  CL 111  --  110  CO2 23  --  23  GLUCOSE 95  --  115*  BUN 8  --  12  CREATININE 0.88  --  0.93  CALCIUM 7.8*  --  8.2*  MG 2.0 1.9  --   PHOS 3.0 3.1  --     Lipid Panel:  Recent Labs  Lab 07/18/22 0335  CHOL 176  TRIG 241*  246*  HDL 44  CHOLHDL 4.0  VLDL 48*  LDLCALC 84    HgbA1c:  Recent Labs  Lab 07/18/22 1500  HGBA1C 5.7*    Urine Drug Screen: No results for input(s): "LABOPIA", "COCAINSCRNUR", "LABBENZ", "AMPHETMU", "THCU", "LABBARB" in the last 168 hours.  Alcohol Level  Recent Labs  Lab 07/17/22 1237  ETH <10     IMAGING past 24 hours DG CHEST PORT 1 VIEW  Result Date: 07/20/2022 CLINICAL DATA:  Aspiration.  COVID positive. EXAM: PORTABLE CHEST 1 VIEW COMPARISON:  July 17, 2022 FINDINGS: The ET tube has been removed in the interval. A feeding tube terminates below today's film. No  pneumothorax. The heart is borderline to mildly enlarged. The hila and mediastinum are unchanged. No pulmonary nodules or masses. No suspicious infiltrates. Mild atelectasis at the right base. No convincing evidence of aspiration. IMPRESSION: 1. Support apparatus as above. 2. Mild atelectasis at the right base. 3. No convincing evidence of aspiration. Electronically Signed   By: Dorise Bullion III M.D.   On: 07/20/2022 11:10    PHYSICAL EXAM  Physical Exam  Constitutional: Appears well-developed and well-nourished.   Cardiovascular: Normal rate and regular rhythm.  Respiratory: Effort normal, non-labored breathing  Neuro: Mental Status: Opens eyes spontaneously, more alert today, able to state name and follow simple commands Cranial Nerves: II: Visual Fields are full. Pupils are equal, round, and reactive to light.   III,IV, VI: Extraocular movements intact VIII: Hearing intact to voice XI: head is midline Motor: Tone is normal. Bulk is normal. Able to follow commands with all 4  extremities  Sensory: Appears intact to light touch throughout    ASSESSMENT/PLAN Mr. Emannuel Vise is a 71 y.o. male with history of cataracts, celiac disease and retinal detachment who presents to the ED with acute onset of left hemiplegia, right gaze deviation and left hemineglect while working in his garage, he does have an abrasion on his forehead but denied hitting his head in the garage.  TNK was given and he was emergently taken for a mechanical thrombectomy of right ICA terminus at the level of a fetal right PCA. Mechanical thrombectomy performedd with direct contact aspiration achieving completer recanalization (TICI3).  1800 noted right sided weakness and new CT Head and CTA head and neck reordered and negative for reocclusion. MRI shows bilateral basal ganglia infarcts. ASA 41m started 12/17 Patient is improving, but requires restraints due to continued attempts to pull tubes and lines.  Will  transfer out of the ICU today and hopefully retry swallow study in a few days.  Stroke:  Bilateral basal ganglia infarcts s/p TNK and MT of terminal right ICA occlusion with successful complete recanalization Etiology: Likely paroxysmal A-fib given history of it in the past .He is also COVID-positive code Stroke CT head Hyperdense right ICA terminus and proximal right M1 segment consistent with acute thrombus ASPECTS 7    CTA head & neck Occlusion of the right internal carotid artery at the level of the posterior communicating artery. Marked attenuation of a posterior right M2 branch which appears to fill, presumably from PL collateral vessels.  Repeat CT Head Evolving right MCA territory infarct appears to be limited to the right basal ganglia. Repeat CTA Head and Neck- Right internal carotid artery is reconstituted without focal stenosis or dissection through the ICA terminus. Right M1 segment and MCA bifurcations are now patent. Right MCA branch vessels are now revascularized. MRI  Acute/subacute nonhemorrhagic infarct of the right caudate head and lentiform nucleus as noted on the previous CT scan. Acute/subacute nonhemorrhagic infarct of the left caudate head and lentiform nucleus spanning the internal capsule. Acute/subacute nonhemorrhagic infarct of the cortex of the right temporal tip. Single punctate focus of restricted diffusion in the right precentral cortex. 2D Echo left ventricular ejection fraction 60 to 65%. LDL 84 HgbA1c 5.7. VTE prophylaxis -     Diet   Diet NPO time specified   No antithrombotic prior to admission, now on aspirin 81 mg daily. May consider [place] 2-4 days.  Therapy recommendations: Acute inpatient rehab Disposition:  Pending   Hypertension Home meds:   None Stable BP Goal keep systolic blood pressure less than 180 Long-term BP goal normotensive  Hyperlipidemia LDL 84, goal < 70 Add rosuvastatin 20 mg daily Continue statin at discharge  Diabetes type  II Controlled Home meds:  None HgbA1c 5.7, goal < 7.0 CBGs Recent Labs    07/20/22 1603 07/20/22 1939 07/21/22 0001  GLUCAP 101* 114* 107*     SSI  Dysphagia Cortrak in place SLP on board CIR consulted as well  Other Stroke Risk Factors Advanced Age >/= 647  Other Active Problems Respiratory failure COVID positive- symptoms started on Tuesday 12/12 Intubated for procedure Extubated 12/17  CT value of 27 CXR shows right base atelectasis, no signs of aspiration  Hospital day # 4  Patient seen and examined by NP/APP with MD. MD to update note as needed.   DJanine Ores DNP, FNP-BC [facility] Pager: [phone] have personally obtained history,examined this patient, reviewed notes, independently viewed imaging studies, participated  in medical decision making and plan of care.ROS completed by me personally and pertinent positives fully documented  I have made any additions or clarifications directly to the above note. Agree with note above.  Continue aspirin for now.  May need to do loop recorder at discharge.  Continue tube feeds and speech therapy to swallow eval.  Continue ongoing therapies.  Hopefully discharge to rehab in the next few days.  Long discussion at the bedside with the patient's wife and answered questions.  Greater than 50% time during this 35-minute visit was spent in counseling and coordination of care about his stroke discussion with patient, wife and care team and answering questions  Antony Contras, MD Medical Director [place] Pager: [phone] 07/21/2022 1:50 PM  To contact Stroke Continuity provider, please refer to [email or web address] After hours, contact General Neurology

## 2022-07-21 NOTE — Progress Notes (Signed)
Speech Language Pathology Treatment: Dysphagia  Patient Details Name: Douglas Cox MRN: [id number] DOB: 1951/05/25 Today's Date: 07/21/2022 Time: 2023-3435 SLP Time Calculation (min) (ACUTE ONLY): 15 min  Assessment / Plan / Recommendation Clinical Impression  Patient seen by SLP for skilled treatment for dysphagia goals, however as patient unable to be awakened, session focused on education with spouse who was in the room. SLP explained plan of care which is for a repeat objective swallow study (had FEES on 07/19/22) when patient able to be adequately awake and alert. Spouse told SLP that he had worn himself out from being restless and fidgeting in bed and that he tries to pull at [facility] as well as soft waist belt restraint. SLP reviewed results from FEES and when discussing findings that were consistent with potential LPR, spouse stated that patient never complained of any significant reflux but he did have belching, hiccups and he was scheduled for an OP UGI. SLP reviewed Esophagram from 2022 which was unremarkable. Spouse was encouraged that patient did speak some sentences today, saying he wanted the feeding tube out of his nose, telling her "I'm dreaming", etc. SLP discussed progression of recovery and that main factor that is impacting this is patient's ability to maintain alertness. Plan is for SLP to return tomorrow and determine patient readiness for repeat objective swallow study.    HPI HPI: [name] is a 71 y.o. male who presented to the ED with acute onset of left hemiplegia, right gaze deviation and left hemineglect while working in his garage.  TNK given and recanalization acheived with thrombectomy.  Later at 1800 noted right sided weakness and new CT Head and CTA head and neck reordered and negative for reocclusion. MRI today shows bilateral basal ganglia infarcts. ASA 67m started 12/17  Pt with ETT for thrombectomy and overnight.  Extubated 12/17. FEES on 07/19/22  recommending continue NPO; patient has Cortrak.      SLP Plan  Continue with current plan of care      Recommendations for follow up therapy are one component of a multi-disciplinary discharge planning process, led by the attending physician.  Recommendations may be updated based on patient status, additional functional criteria and insurance authorization.    Recommendations  Diet recommendations: NPO Medication Administration: Via alternative means                Oral Care Recommendations: Oral care QID;Oral care prior to ice chip/H20 Follow Up Recommendations: Acute inpatient rehab (3hours/day) Assistance recommended at discharge: Frequent or constant Supervision/Assistance SLP Visit Diagnosis: Dysphagia, unspecified (R13.10) Plan: Continue with current plan of care          JSonia Baller MA, CCC-SLP Speech Therapy

## 2022-07-21 NOTE — Progress Notes (Signed)
Per Infection Prevention, patient must stay on contact isolation due to CT level indicative of active infection.

## 2022-07-21 NOTE — Plan of Care (Signed)
  Problem: Education: Goal: Knowledge of disease or condition will improve Outcome: Progressing Goal: Knowledge of secondary prevention will improve (MUST DOCUMENT ALL) Outcome: Progressing Goal: Knowledge of patient specific risk factors will improve Douglas Cox N/A or DELETE if not current risk factor) Outcome: Progressing   Problem: Ischemic Stroke/TIA Tissue Perfusion: Goal: Complications of ischemic stroke/TIA will be minimized Outcome: Progressing   Problem: Coping: Goal: Will verbalize positive feelings about self Outcome: Progressing Goal: Will identify appropriate support needs Outcome: Progressing   Problem: Health Behavior/Discharge Planning: Goal: Ability to manage health-related needs will improve Outcome: Progressing Goal: Goals will be collaboratively established with patient/family Outcome: Progressing   Problem: Self-Care: Goal: Ability to participate in self-care as condition permits will improve Outcome: Progressing Goal: Verbalization of feelings and concerns over difficulty with self-care will improve Outcome: Progressing Goal: Ability to communicate needs accurately will improve Outcome: Progressing   Problem: Nutrition: Goal: Risk of aspiration will decrease Outcome: Progressing Goal: Dietary intake will improve Outcome: Progressing   Problem: Education: Goal: Understanding of CV disease, CV risk reduction, and recovery process will improve Outcome: Progressing Goal: Individualized Educational Video(s) Outcome: Progressing   Problem: Activity: Goal: Ability to return to baseline activity level will improve Outcome: Progressing   Problem: Cardiovascular: Goal: Ability to achieve and maintain adequate cardiovascular perfusion will improve Outcome: Progressing Goal: Vascular access site(s) Level 0-1 will be maintained Outcome: Progressing   Problem: Health Behavior/Discharge Planning: Goal: Ability to safely manage health-related needs after  discharge will improve Outcome: Progressing   Problem: Education: Goal: Knowledge of General Education information will improve Description: Including pain rating scale, medication(s)/side effects and non-pharmacologic comfort measures Outcome: Progressing   Problem: Health Behavior/Discharge Planning: Goal: Ability to manage health-related needs will improve Outcome: Progressing   Problem: Clinical Measurements: Goal: Ability to maintain clinical measurements within normal limits will improve Outcome: Progressing Goal: Will remain free from infection Outcome: Progressing Goal: Diagnostic test results will improve Outcome: Progressing Goal: Respiratory complications will improve Outcome: Progressing Goal: Cardiovascular complication will be avoided Outcome: Progressing   Problem: Activity: Goal: Risk for activity intolerance will decrease Outcome: Progressing   Problem: Nutrition: Goal: Adequate nutrition will be maintained Outcome: Progressing   Problem: Coping: Goal: Level of anxiety will decrease Outcome: Progressing   Problem: Elimination: Goal: Will not experience complications related to bowel motility Outcome: Progressing Goal: Will not experience complications related to urinary retention Outcome: Progressing   Problem: Pain Managment: Goal: General experience of comfort will improve Outcome: Progressing   Problem: Safety: Goal: Ability to remain free from injury will improve Outcome: Progressing   Problem: Skin Integrity: Goal: Risk for impaired skin integrity will decrease Outcome: Progressing

## 2022-07-21 NOTE — Plan of Care (Signed)
  Problem: Ischemic Stroke/TIA Tissue Perfusion: Goal: Complications of ischemic stroke/TIA will be minimized Outcome: Not Progressing   Problem: Self-Care: Goal: Ability to communicate needs accurately will improve Outcome: Not Progressing   Problem: Nutrition: Goal: Risk of aspiration will decrease Outcome: Not Progressing Goal: Dietary intake will improve Outcome: Not Progressing

## 2022-07-21 NOTE — Progress Notes (Signed)
Physical Therapy Treatment Patient Details Name: Douglas Cox MRN: [id number] DOB: Apr 28, 1951 Today's Date: 07/21/2022   History of Present Illness Douglas Cox is an 71 y.o. 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 PMHx of cataracts, celiac disease and retinal detachment    PT Comments    Limited treatment this evening due to lethargy.  Wife reports pt trying to get OOB earlier today but is sleeping now.  She still requested PT to try therapy as pt likes to move.  Able to arouse pt with increased time but he was not able to follow commands and kept pushing to lay back down.  Did sit EOB briefly but then pt shifted up in bed, laid down, and covered up.  Of note he did receive Seroquel around 1300 and it was dark outside at time of session.  Recommend trying earlier in day for next session.     Recommendations for follow up therapy are one component of a multi-disciplinary discharge planning process, led by the attending physician.  Recommendations may be updated based on patient status, additional functional criteria and insurance authorization.  Follow Up Recommendations  Acute inpatient rehab (3hours/day)     Assistance Recommended at Discharge Frequent or constant Supervision/Assistance  Patient can return home with the following A little help with walking and/or transfers;A little help with bathing/dressing/bathroom;Assistance with cooking/housework;Assist for transportation;Help with stairs or ramp for entrance   Equipment Recommendations  Other  (comment) (defer to post acute)    Recommendations for Other Services       Precautions / Restrictions Precautions Precautions: Fall Precaution Comments: waist restraint     Mobility  Bed Mobility Overal bed mobility: Needs Assistance Bed Mobility: Supine to Sit, Sit to Supine     Supine to sit: Mod assist Sit to supine: Supervision   General bed mobility comments: Tried verbal cues , tactile cues, gestures, and assist but pt not following commands well. Pt pushing to lay down with attempts to sit.  Able to get pt sitting EOB but then he shifted self up in bed, returned to supine and covered up.    Transfers                        Ambulation/Gait                   Stairs             Wheelchair Mobility    Modified Rankin (Stroke Patients Only) Modified Rankin (Stroke Patients Only) Pre-Morbid Rankin Score: No symptoms Modified Rankin: Moderately severe disability     Balance Overall balance assessment: Needs assistance Sitting-balance support: No upper extremity supported, Feet supported Sitting balance-Leahy Scale: Fair                                      Cognition Arousal/Alertness: Lethargic Behavior During Therapy: Impulsive Overall Cognitive Status: Impaired/Different from baseline  General Comments: Wife reports pt restless and trying to get OOB earlier but now sleeping.  She still would like therapy to try.  Able to arouse pt but pt not following commands and trying to lay back down once sitting.  Did note received Seroquel at 13:00.        Exercises      General Comments General comments (skin integrity, edema, etc.): Wife asking about her assisting with  ROM and massage throughout day.  Encouraged as able. DId note pt with good ROM and no contractures      Pertinent Vitals/Pain Pain Assessment Pain Assessment: No/denies pain    Home Living                           Prior Function            PT Goals (current goals can now be found in the care plan section) Progress towards PT goals: Not progressing toward goals - comment (end of day, fatigued, seroquel?)    Frequency    Min 4X/week      PT Plan Current plan remains appropriate;Frequency needs to be updated    Co-evaluation              AM-PAC PT "6 Clicks" Mobility   Outcome Measure  Help needed turning from your back to your side while in a flat bed without using bedrails?: A Little Help needed moving from lying on your back to sitting on the side of a flat bed without using bedrails?: A Little Help needed moving to and from a bed to a chair (including a wheelchair)?: A Lot Help needed standing up from a chair using your arms (e.g., wheelchair or bedside chair)?: A Lot Help needed to walk in hospital room?: A Lot Help needed climbing 3-5 steps with a railing? : Total 6 Click Score: 13    End of Session   Activity Tolerance: Patient limited by lethargy Patient left: in bed;with call bell/phone within reach;with bed alarm set (waist restraint in place) Nurse Communication: Mobility status PT Visit Diagnosis: Unsteadiness on feet (R26.81);Other abnormalities of gait and mobility (R26.89);Other symptoms and signs involving the nervous system (R29.898)     Time: 1725-1743 PT Time Calculation (min) (ACUTE ONLY): 18 min  Charges:  $Therapeutic Activity: 8-22 mins                     Abran Richard, PT Acute Rehab [facility] [phone]    Karlton Lemon 07/21/2022, 5:58 PM

## 2022-07-21 NOTE — Progress Notes (Signed)
Inpatient Rehab Admissions Coordinator:   Left message for spouse to discuss CIR recommendations.   Shann Medal, PT, DPT Admissions Coordinator [phone] 07/21/22  12:43 PM

## 2022-07-21 NOTE — TOC Progression Note (Signed)
Transition of Care [facility]) - Progression Note    Patient Details  Name: Douglas Cox MRN: [id number] Date of Birth: 1951/03/26  [facility] CM/SW Contact  Pollie Friar, RN Phone Number: 07/21/2022, 3:34 PM  Clinical Narrative:    Pt continues with cortrak.  CIR to start insurance auth today.  TOC following.   Planned Disposition: Inpatient Rehab    Expected Discharge Plan and Services                                               Social Determinants of Health (SDOH) Interventions SDOH Screenings   Tobacco Use: Medium Risk (07/18/2022)    Readmission Risk Interventions     No data to display

## 2022-07-22 ENCOUNTER — Inpatient Hospital Stay (HOSPITAL_COMMUNITY)
Admission: RE | Admit: 2022-07-22 | Discharge: 2022-08-03 | DRG: 056 | Disposition: A | Discharge status: Home / Self Care | Payer: Medicare Other | Source: Intra-hospital | Attending: Physical Medicine & Rehabilitation | Admitting: Physical Medicine & Rehabilitation

## 2022-07-22 DIAGNOSIS — E538 Deficiency of other specified B group vitamins: Secondary | ICD-10-CM | POA: Diagnosis present

## 2022-07-22 DIAGNOSIS — Z79899 Other long term (current) drug therapy: Secondary | ICD-10-CM | POA: Diagnosis not present

## 2022-07-22 DIAGNOSIS — Z87891 Personal history of nicotine dependence: Secondary | ICD-10-CM

## 2022-07-22 DIAGNOSIS — K900 Celiac disease: Secondary | ICD-10-CM | POA: Diagnosis present

## 2022-07-22 DIAGNOSIS — R1310 Dysphagia, unspecified: Secondary | ICD-10-CM | POA: Diagnosis present

## 2022-07-22 DIAGNOSIS — E785 Hyperlipidemia, unspecified: Secondary | ICD-10-CM | POA: Diagnosis present

## 2022-07-22 DIAGNOSIS — Z881 Allergy status to other antibiotic agents status: Secondary | ICD-10-CM

## 2022-07-22 DIAGNOSIS — E739 Lactose intolerance, unspecified: Secondary | ICD-10-CM | POA: Diagnosis present

## 2022-07-22 DIAGNOSIS — I639 Cerebral infarction, unspecified: Secondary | ICD-10-CM

## 2022-07-22 DIAGNOSIS — Z7982 Long term (current) use of aspirin: Secondary | ICD-10-CM | POA: Diagnosis not present

## 2022-07-22 DIAGNOSIS — Z9841 Cataract extraction status, right eye: Secondary | ICD-10-CM

## 2022-07-22 DIAGNOSIS — U071 COVID-19: Secondary | ICD-10-CM | POA: Diagnosis present

## 2022-07-22 DIAGNOSIS — R001 Bradycardia, unspecified: Secondary | ICD-10-CM | POA: Diagnosis not present

## 2022-07-22 DIAGNOSIS — I10 Essential (primary) hypertension: Secondary | ICD-10-CM | POA: Diagnosis present

## 2022-07-22 DIAGNOSIS — G479 Sleep disorder, unspecified: Secondary | ICD-10-CM | POA: Diagnosis not present

## 2022-07-22 DIAGNOSIS — I69354 Hemiplegia and hemiparesis following cerebral infarction affecting left non-dominant side: Principal | ICD-10-CM

## 2022-07-22 DIAGNOSIS — L219 Seborrheic dermatitis, unspecified: Secondary | ICD-10-CM | POA: Diagnosis not present

## 2022-07-22 DIAGNOSIS — I6521 Occlusion and stenosis of right carotid artery: Secondary | ICD-10-CM | POA: Diagnosis present

## 2022-07-22 DIAGNOSIS — R4587 Impulsiveness: Secondary | ICD-10-CM | POA: Diagnosis present

## 2022-07-22 DIAGNOSIS — Z9102 Food additives allergy status: Secondary | ICD-10-CM | POA: Diagnosis not present

## 2022-07-22 DIAGNOSIS — Z9842 Cataract extraction status, left eye: Secondary | ICD-10-CM

## 2022-07-22 DIAGNOSIS — I6381 Other cerebral infarction due to occlusion or stenosis of small artery: Secondary | ICD-10-CM | POA: Diagnosis not present

## 2022-07-22 DIAGNOSIS — K8681 Exocrine pancreatic insufficiency: Secondary | ICD-10-CM | POA: Diagnosis present

## 2022-07-22 DIAGNOSIS — Z888 Allergy status to other drugs, medicaments and biological substances status: Secondary | ICD-10-CM

## 2022-07-22 DIAGNOSIS — I4891 Unspecified atrial fibrillation: Secondary | ICD-10-CM | POA: Diagnosis not present

## 2022-07-22 DIAGNOSIS — Z885 Allergy status to narcotic agent status: Secondary | ICD-10-CM | POA: Diagnosis not present

## 2022-07-22 DIAGNOSIS — I69391 Dysphagia following cerebral infarction: Secondary | ICD-10-CM | POA: Diagnosis not present

## 2022-07-22 LAB — GLUCOSE, CAPILLARY
Glucose-Capillary: 115 mg/dL — ABNORMAL HIGH (ref 70–99)
Glucose-Capillary: 120 mg/dL — ABNORMAL HIGH (ref 70–99)
Glucose-Capillary: 121 mg/dL — ABNORMAL HIGH (ref 70–99)
Glucose-Capillary: 122 mg/dL — ABNORMAL HIGH (ref 70–99)
Glucose-Capillary: 97 mg/dL (ref 70–99)

## 2022-07-22 MED ORDER — QUETIAPINE FUMARATE 25 MG PO TABS: 12.5000 mg | ORAL_TABLET | Freq: Two times a day (BID) | ORAL | Status: DC

## 2022-07-22 MED ORDER — ASPIRIN 81 MG PO CHEW
81.0000 mg | CHEWABLE_TABLET | Freq: Every day | ORAL | Status: DC
  Administered 2022-07-24: 81 mg
  Filled 2022-07-22 (×2): qty 1

## 2022-07-22 MED ORDER — ROSUVASTATIN CALCIUM 20 MG PO TABS
20.0000 mg | ORAL_TABLET | Freq: Every day | ORAL | Status: DC
  Administered 2022-07-23 – 2022-08-03 (×12): 20 mg via ORAL
  Filled 2022-07-22 (×12): qty 1

## 2022-07-22 MED ORDER — SODIUM CHLORIDE 0.45 % IV SOLN: INTRAVENOUS | Status: DC

## 2022-07-22 MED ORDER — FAMOTIDINE 20 MG PO TABS
20.0000 mg | ORAL_TABLET | Freq: Two times a day (BID) | ORAL | Status: DC
  Administered 2022-07-22 – 2022-08-03 (×24): 20 mg via ORAL
  Filled 2022-07-22 (×24): qty 1

## 2022-07-22 MED ORDER — POLYETHYLENE GLYCOL 3350 17 G PO PACK
17.0000 g | PACK | Freq: Every day | ORAL | Status: DC
  Administered 2022-07-23 – 2022-07-29 (×5): 17 g
  Filled 2022-07-22 (×7): qty 1

## 2022-07-22 MED ORDER — SODIUM CHLORIDE 0.9 % IV SOLN: 1000.0000 mL | INTRAVENOUS | 0 refills | Status: DC

## 2022-07-22 MED ORDER — QUETIAPINE FUMARATE 25 MG PO TABS: 12.5000 mg | ORAL_TABLET | Freq: Every day | ORAL | Status: DC

## 2022-07-22 MED ORDER — MUPIROCIN CALCIUM 2 % EX CREA: TOPICAL_CREAM | Freq: Two times a day (BID) | CUTANEOUS | Status: DC

## 2022-07-22 MED ORDER — SODIUM CHLORIDE 0.9 % IV SOLN: INTRAVENOUS | Status: DC

## 2022-07-22 MED ORDER — FAMOTIDINE 20 MG PO TABS: 20.0000 mg | ORAL_TABLET | Freq: Two times a day (BID) | ORAL | Status: DC

## 2022-07-22 MED ORDER — ASPIRIN 81 MG PO TBEC: 81.0000 mg | DELAYED_RELEASE_TABLET | Freq: Every day | ORAL | 12 refills | Status: DC

## 2022-07-22 MED ORDER — ASPIRIN 81 MG PO TBEC
81.0000 mg | DELAYED_RELEASE_TABLET | Freq: Every day | ORAL | Status: DC
  Administered 2022-07-23: 81 mg via ORAL
  Filled 2022-07-22: qty 1

## 2022-07-22 MED ORDER — ASPIRIN 300 MG RE SUPP
300.0000 mg | Freq: Every day | RECTAL | Status: DC
  Filled 2022-07-22: qty 1

## 2022-07-22 MED ORDER — SENNOSIDES-DOCUSATE SODIUM 8.6-50 MG PO TABS
1.0000 | ORAL_TABLET | Freq: Two times a day (BID) | ORAL | Status: DC
  Administered 2022-07-22 – 2022-08-03 (×23): 1
  Filled 2022-07-22 (×24): qty 1

## 2022-07-22 MED ORDER — ROSUVASTATIN CALCIUM 20 MG PO TABS: 20.0000 mg | ORAL_TABLET | Freq: Every day | ORAL | Status: DC

## 2022-07-22 NOTE — Consult Note (Signed)
ELECTROPHYSIOLOGY CONSULT NOTE  Patient ID: Douglas Cox MRN: [id number], DOB/AGE: September 21, 1950   Admit date: 07/17/2022 Date of Consult: 07/22/2022  Primary Physician: Merryl Hacker, No Primary Cardiologist: none Reason for Consultation: Cryptogenic stroke; recommendations regarding Implantable Loop Recorder, requested by Dr. Leonie Man  History of Present Illness Douglas Cox was admitted on 07/17/2022 with acute L paralysis, R gaze deviation, > stroke > TNK > IR thrombectomy of the R ICA.    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   I have discussed case with Dr. Leonie Man earlier in the week, pt's wife reported being told over 10 years ago that the patient had a single known episode of AFib, none since then that they are aware of. With recurrent/progressive deficits while here, reviewed telemetry without    he has undergone workup for stroke including echocardiogram and carotid angios.  The patient has been monitored on telemetry which has demonstrated sinus rhythm with no arrhythmias.    Neurology has deferred TEE   Echocardiogram this admission demonstrated.    1. Left ventricular ejection fraction, by estimation, is 60 to 65%. The  left ventricle has normal function. Left ventricular endocardial border  not optimally defined to evaluate regional wall motion. Left ventricular  diastolic function could not be  evaluated.   2. Right ventricular systolic function is normal. The right ventricular  size is normal. Tricuspid regurgitation signal is inadequate for assessing  PA pressure.   3. The mitral valve was not well visualized. No  evidence of mitral valve  regurgitation.   4. The aortic valve was not well visualized. Aortic valve regurgitation  is not visualized. No aortic stenosis is present.   5. Limited study stopped early by patient.   Comparison(s): No prior Echocardiogram.   Lab work is reviewed.   Prior to admission, the patient denies chest pain, shortness of breath, dizziness, palpitations, or syncope.  They are recovering from their stroke with plans to CIR at discharge.    Past Medical History:  Diagnosis Date   Celiac disease    Retinal detachment      Surgical History:  Past Surgical History:  Procedure Laterality Date   CATARACT EXTRACTION Bilateral    Dr. Valetta Close   EYE SURGERY     FRACTURE SURGERY     GAS INSERTION Right 05/21/2021   Procedure: INSERTION OF GAS - C3F8;  Surgeon: Bernarda Caffey, MD;  Location: [place];  Service: Ophthalmology;  Laterality: Right;   GAS/FLUID EXCHANGE Right 05/21/2021   Procedure: GAS/FLUID EXCHANGE;  Surgeon: Bernarda Caffey, MD;  Location: [place];  Service: Ophthalmology;  Laterality: Right;   HERNIA REPAIR     IR ANGIO INTRA EXTRACRAN SEL INTERNAL CAROTID UNI R MOD SED  07/17/2022   IR PERCUTANEOUS ART THROMBECTOMY/INFUSION INTRACRANIAL INC DIAG ANGIO  07/17/2022   IR US GUIDE VASC ACCESS RIGHT  07/17/2022   PARS PLANA VITRECTOMY Right 05/21/2021   Procedure: PARS PLANA VITRECTOMY WITH 25 GAUGE;  Surgeon: Bernarda Caffey, MD;  Location: [place];  Service: Ophthalmology;  Laterality: Right;   PERFLUORONE INJECTION Right 05/21/2021   Procedure: PERFLUORONE INJECTION;  Surgeon: Bernarda Caffey, MD;  Location: [place];  Service: Ophthalmology;  Laterality: Right;   PHOTOCOAGULATION WITH  LASER Right 05/21/2021   Procedure: PHOTOCOAGULATION WITH LASER;  Surgeon: Bernarda Caffey, MD;  Location: [place];  Service: Ophthalmology;  Laterality: Right;   RADIOLOGY WITH ANESTHESIA N/A 07/17/2022   Procedure: IR WITH ANESTHESIA;  Surgeon: Radiologist, Medication, MD;  Location: [place];   Service: Radiology;  Laterality: N/A;   RETINAL DETACHMENT SURGERY       Medications Prior to Admission  Medication Sig Dispense Refill Last Dose   albuterol (VENTOLIN HFA) 108 (90 Base) MCG/ACT inhaler Inhale 1-2 puffs into the lungs every 6 (six) hours as needed for wheezing or shortness of breath.   UNK   cholecalciferol (VITAMIN D3) 25 MCG (1000 UNIT) tablet Take 1,000 Units by mouth daily.   UNK   Cyanocobalamin 1000 MCG/ML KIT Inject 1,000 mcg into the muscle every 14 (fourteen) days.   UNK   hydrocortisone (ANUSOL-HC) 25 MG suppository Place 25 mg rectally at bedtime.   UNK   PRESCRIPTION MEDICATION Apply 1 Application topically 2 (two) times daily as needed (rash). Fluticasone/ketoconazole (compounded)   UNK   Probiotic Product (ALIGN PO) Take 1 capsule by mouth daily.   UNK   psyllium (METAMUCIL) 58.6 % powder Take 1 packet by mouth daily.   UNK   famotidine (PEPCID) 40 MG tablet Take 40 mg by mouth daily. (Patient not taking: Reported on 07/19/2022)   Not Taking    Inpatient Medications:   aspirin EC  81 mg Oral Daily   Or   aspirin  300 mg Rectal Daily   Or   aspirin  81 mg Per Tube Daily   famotidine  20 mg Per Tube BID   fiber supplement (BANATROL TF)  60 mL Per Tube BID   mupirocin cream   Topical BID   polyethylene glycol  17 g Per Tube Daily   QUEtiapine  12.5 mg Oral BID   rosuvastatin  20 mg Per Tube Daily   senna-docusate  1 tablet Per Tube BID    Allergies:  Allergies  Allergen Reactions   Codone [Hydrocodone] Hives   Gluten Meal Other (See Comments)    Stomach issues   Lactose Intolerance (Gi)     GI issues   Nitrous Oxide     Can cause blindness in right eye    Tylenol [Acetaminophen] Hives   Clarithromycin Anxiety    Social History   Socioeconomic History   Marital status: Married    Spouse name: Not on file   Number of children: Not on file   Years of education: Not on file   Highest education level: Not on file  Occupational History    Not on file  Tobacco Use   Smoking status: Former    Types: Cigarettes    Quit date: 01/30/2006    Years since quitting: 16.4   Smokeless tobacco: Never  Vaping Use   Vaping Use: Never used  Substance and Sexual Activity   Alcohol use: Yes    Alcohol/week: 4.0 - 5.0 standard drinks of alcohol    Types: 4 - 5 Shots of liquor per week    Comment: 3 glasses of whiskey per night.   Drug use: Yes    Frequency: 7.0 times per week    Types: Marijuana   Sexual activity: Not Currently  Other Topics Concern   Not on file  Social History Narrative   Not on file   Social Determinants of Health   Financial Resource Strain: Not on file  Food Insecurity: No Food Insecurity (07/21/2022)  Hunger Vital Sign    Worried About Running Out of Food in the Last Year: Never true    Ran Out of Food in the Last Year: Never true  Transportation Needs: No Transportation Needs (07/21/2022)   PRAPARE - Hydrologist (Medical): No    Lack of Transportation (Non-Medical): No  Physical Activity: Not on file  Stress: Not on file  Social Connections: Not on file  Intimate Partner Violence: Not At Risk (07/21/2022)   Humiliation, Afraid, Rape, and Kick questionnaire    Fear of Current or Ex-Partner: No    Emotionally Abused: No    Physically Abused: No    Sexually Abused: No     History reviewed. Unable to obtain given pt status   Review of Systems: All other systems reviewed and are otherwise negative except as noted above.  Physical Exam: Vitals:   07/22/22 0600 07/22/22 0915 07/22/22 0917 07/22/22 1235  BP:  (!) 163/104 (!) 146/71 105/86  Pulse:  (!) 50 (!) 51 (!) 52  Resp: _0 Temp:  98 F (36.7 C)  98.3 F (36.8 C)  TempSrc:    Oral  SpO2:  99%  93%  Weight:      Height:        GEN- The patient is lethargic, arouses only to firm sternal rub but unable to stay awake Head- normocephalic, atraumatic Eyes-  Sclera clear, conjunctiva pink Ears- unable  to assess  Oropharynx- not examined Neck- no JVD Lungs- normal work of breathing Heart- RRR, no murmurs, rubs or gallops  GI- soft, NT Extremities- no clubbing, cyanosis, or edema MS- no significant deformity or atrophy Skin- no rash or lesion Psych- unable to assess   Labs:   Lab Results  Component Value Date   WBC 7.6 07/20/2022   HGB 12.0 (L) 07/20/2022   HCT 35.9 (L) 07/20/2022   MCV 101.4 (H) 07/20/2022   PLT 181 07/20/2022    Recent Labs  Lab 07/20/22 0505  NA 141  K 3.9  CL 110  CO2 23  BUN 12  CREATININE 0.93  CALCIUM 8.2*  PROT 5.7*  BILITOT 0.8  ALKPHOS 47  ALT 14  AST 27  GLUCOSE 115*   No results found for: "CKTOTAL", "CKMB", "CKMBINDEX", "TROPONINI" Lab Results  Component Value Date   CHOL 176 07/18/2022   Lab Results  Component Value Date   HDL 44 07/18/2022   Lab Results  Component Value Date   LDLCALC 84 07/18/2022   Lab Results  Component Value Date   TRIG 241 (H) 07/18/2022   TRIG 246 (H) 07/18/2022   Lab Results  Component Value Date   CHOLHDL 4.0 07/18/2022   No results found for: "LDLDIRECT"  No results found for: "DDIMER"   Radiology/Studies:  DG CHEST PORT 1 VIEW Result Date: 07/20/2022 CLINICAL DATA:  Aspiration.  COVID positive. EXAM: PORTABLE CHEST 1 VIEW COMPARISON:  July 17, 2022 FINDINGS: The ET tube has been removed in the interval. A feeding tube terminates below today's film. No pneumothorax. The heart is borderline to mildly enlarged. The hila and mediastinum are unchanged. No pulmonary nodules or masses. No suspicious infiltrates. Mild atelectasis at the right base. No convincing evidence of aspiration. IMPRESSION: 1. Support apparatus as above. 2. Mild atelectasis at the right base. 3. No convincing evidence of aspiration. Electronically Signed   By: Dorise Bullion III M.D.   On: 07/20/2022 11:10   DG Abd Portable 1V Result Date:  07/19/2022 CLINICAL DATA:  Feeding tube placement. EXAM: PORTABLE ABDOMEN - 1  VIEW COMPARISON:  Abdominal radiograph yesterday. FINDINGS: Tip of the weighted enteric tube is just to the right of midline in the region of the distal stomach. Overlying artifact in place. Nonobstructive upper abdominal bowel gas pattern. IMPRESSION: Tip of the weighted enteric tube projecting over the distal stomach. Electronically Signed   By: Keith Rake M.D.   On: 07/19/2022 12:35     MR BRAIN WO CONTRAST Result Date: 07/18/2022 CLINICAL DATA:  Stroke, follow-up. EXAM: MRI HEAD WITHOUT CONTRAST TECHNIQUE: Multiplanar, multiecho pulse sequences of the brain and surrounding structures were obtained without intravenous contrast. COMPARISON:  CT head without contrast 07/17/2022 FINDINGS: Brain: The diffusion-weighted images confirm acute/subacute nonhemorrhagic infarct of the right caudate head and lentiform nucleus. An acute nonhemorrhagic infarct is present in the left caudate head and lentiform nucleus spanning the internal capsule. There is more sparing of the inferior left caudate head on the left. A single punctate focus of restricted diffusion is present in the precentral cortex on image 90 of series 5, the axial diffusion-weighted sequence. This is confirmed on image 56 of series 7. Restricted diffusion is present in the cortex of the right temporal tip. No other significant peripheral cortical infarcts are present. The examination had to be discontinued prior to completion due to patient unable to tolerate further imaging and attempting to take off the head coil. T2 and FLAIR hyperintensities are associated with the areas of acute/subacute infarction. No other significant white matter disease is present. The ventricles are of normal size. No significant extraaxial fluid collection is present. The internal auditory canals are within normal limits. The brainstem and cerebellum are within normal limits. Vascular: Flow is present in the major intracranial arteries. Skull and upper cervical spine:  The craniocervical junction is normal. Upper cervical spine is within normal limits. Marrow signal is unremarkable. Sinuses/Orbits: Fluid is present in the right sphenoid sinus. Mucosal thickening is present the anterior ethmoid air cells and inferior frontal sinus. Left mastoid effusion is present. The globes and orbits are within normal limits. Other: IMPRESSION: 1. Acute/subacute nonhemorrhagic infarct of the right caudate head and lentiform nucleus as noted on the previous CT scan. 2. Acute/subacute nonhemorrhagic infarct of the left caudate head and lentiform nucleus spanning the internal capsule. 3. Acute/subacute nonhemorrhagic infarct of the cortex of the right temporal tip. 4. Single punctate focus of restricted diffusion in the right precentral cortex. 5. Mild sinus disease. The above was relayed via text pager to [place] on 07/18/2022 at 13:20 . Electronically Signed   By: San Morelle M.D.   On: 07/18/2022 13:21   DG Abd 1 View Result Date: 07/18/2022 CLINICAL DATA:  Tube placement EXAM: ABDOMEN - 1 VIEW COMPARISON:  None Available. FINDINGS: Upper abdomen image demonstrates a NG tube superimposed with the left upper quadrant. Only the upper portion of the abdomen was included. IMPRESSION: NG tube in place. Electronically Signed   By: Sammie Bench M.D.   On: 07/18/2022 09:28   CT ANGIO HEAD NECK W WO CM Result Date: 07/17/2022 CLINICAL DATA:  Patient initially presented with left hemiplegia. Following revascularization of the right ICA, the patient now has left hemiplegia. EXAM: CT ANGIOGRAPHY HEAD AND NECK TECHNIQUE: Multidetector CT imaging of the head and neck was performed using the standard protocol during bolus administration of intravenous contrast. Multiplanar CT image reconstructions and MIPs were obtained to evaluate the vascular anatomy. Carotid stenosis measurements (when applicable) are obtained utilizing  NASCET criteria, using the distal internal carotid diameter as  the denominator. RADIATION DOSE REDUCTION: This exam was performed according to the departmental dose-optimization program which includes automated exposure control, adjustment of the mA and/or kV according to patient size and/or use of iterative reconstruction technique. CONTRAST:  37m OMNIPAQUE IOHEXOL 350 MG/ML SOLN COMPARISON:  Cerebral arteriogram and CTA head and neck of the same day. FINDINGS: CTA NECK FINDINGS Aortic arch: Atherosclerotic changes at the left subclavian artery origin are stable. Right carotid system: Right common carotid artery is again within normal limits. Atherosclerotic calcifications at the right carotid bifurcation and proximal ICA are stable without significant stenosis or change. Left carotid system: Left common carotid artery is within normal limits. Bifurcation is unremarkable. Cervical left ICA is normal. Vertebral arteries: Left vertebral artery is dominant. No significant interval change. Skeleton: Stable appearance of the cervical spine. Other neck: Soft tissues the neck are otherwise unremarkable. Salivary glands are within normal limits. Thyroid is normal. No significant adenopathy is present. No focal mucosal or submucosal lesions are present. Upper chest: Emphysematous changes again noted. No significant interval change. Review of the MIP images confirms the above findings CTA HEAD FINDINGS Anterior circulation: The right internal carotid artery is reconstituted without focal stenosis or dissection through the ICA terminus. The left internal carotid artery is stable through the ICA terminus. The right M1 segment scratched at the right M1 and A1 segment are now patent. Right M1 segment scratched at the right A1 segment is dominant. Left M1 segment is normal. The MCA bifurcations are now within normal limits bilaterally. Left MCA branch vessels are stable. ACA branch vessels are stable. Right MCA branch vessels are now revascularized. No distal occlusion is present. Posterior  circulation: Vertebral arteries and basilar artery normal. Left posterior cerebral artery originates from the basilar tip. Right posterior cerebral artery is of fetal type. Mild irregularity of distal PCA branch vessels bilaterally is stable. Venous sinuses: The dural sinuses are patent. The straight sinus and deep cerebral veins are intact. Cortical veins are within normal limits. No significant vascular malformation is evident. Anatomic variants: Fetal type right posterior cerebral artery. Review of the MIP images confirms the above findings IMPRESSION: 1. Right internal carotid artery is reconstituted without focal stenosis or dissection through the ICA terminus. 2. Right M1 segment and MCA bifurcations are now patent. 3. Right MCA branch vessels are now revascularized. 4. No new vessel occlusion involving the left anterior circulation. 5. Stable mild distal small vessel disease without significant proximal stenosis, aneurysm, or branch vessel occlusion within the Circle of Willis. 6. Stable atherosclerotic changes at the right carotid bifurcation and proximal ICA without significant stenosis or change. 7.  Aortic Atherosclerosis (ICD10-I70.0). Electronically Signed   By: CSan MorelleM.D.   On: 07/17/2022 19:25   IR PERCUTANEOUS ART THROMBECTOMY/INFUSION INTRACRANIAL INC DIAG ANGIO Result Date: 07/17/2022 INDICATION: 71year old male presenting with left-sided weakness, dysarthria and; NIHSS 15. His past medical history significant for celiac disease retinal detachment and hemorrhoids. Head CT showed a hyperdense right ICA/MCA, hypodense right insular cortex and basal ganglia (ASPECTS 8). He received IV T NK. CT angiogram of the head and neck showed an occlusion of the intracranial right ICA at the level of the fetal right PCA extending into the proximal right A1 and along the entire M1/MCA segment. He was then transferred to our service for mechanical thrombectomy. EXAM: ULTRASOUND-GUIDED VASCULAR  ACCESS DIAGNOSTIC CEREBRAL ANGIOGRAM MECHANICAL THROMBECTOMY FLAT PANEL HEAD CT COMPARISON:  CT/CT angiogram of the head  and neck July 17, 2022. MEDICATIONS: 2 g of Ancef IV. The antibiotic was administered within 1 hour of the procedure ANESTHESIA/SEDATION: The procedure was performed under general anesthesia. CONTRAST:  40 mL of Omnipaque 300 milligram/mL FLUOROSCOPY: Radiation Exposure Index (as provided by the fluoroscopic device): 664 mGy Kerma COMPLICATIONS: None immediate. TECHNIQUE: Informed written consent was obtained from the patient's wife after a thorough discussion of the procedural risks, benefits and alternatives. All questions were addressed. Maximal Sterile Barrier Technique was utilized including caps, mask, sterile gowns, sterile gloves, sterile drape, hand hygiene and skin antiseptic. A timeout was performed prior to the initiation of the procedure. The right groin was prepped and draped in the usual sterile fashion. Using a micropuncture kit and the modified Seldinger technique, access was gained to the right common femoral artery and an 8 French sheath was placed. Real-time ultrasound guidance was utilized for vascular access including the acquisition of a permanent ultrasound image documenting patency of the accessed vessel. Under fluoroscopy, a Zoom 88 guide catheter was navigated over a 6 [place] VTK catheter and a 0.035" Terumo Glidewire into the aortic arch. The catheter was placed into the right common carotid artery and then advanced into the right internal carotid artery. The diagnostic catheter was removed. Frontal and lateral angiograms of the head were obtained. FINDINGS: 1. Normal caliber of the right common femoral artery, adequate for vascular access. 2. 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 PROCEDURE: Using biplane roadmap guidance, a zoom 71 aspiration catheter was navigated over Colossus 35 microguidewire into the  cavernous segment of the right ICA. The aspiration catheter was then advanced to the level of occlusion and connected to an aspiration pump. Continuous aspiration was performed for 1.5 minutes. The guide catheter was connected to a VacLok syringe. The aspiration catheter was subsequently removed under constant aspiration. The guide catheter was aspirated for debris. Right internal carotid artery angiograms with frontal and lateral views of the head showed complete recanalization of the intracranial right ICA and right MCA vascular tree. And occlusion is noted at the distal right A1/ACA. Using biplane roadmap guidance, a zoom 71 aspiration catheter was navigated over Colossus 35 microguidewire into the cavernous segment of the right ICA. The aspiration catheter was then advanced to the level of occlusion in the distal right A1/ACA and connected to an aspiration pump. Continuous aspiration was performed for 2 minutes. The guide catheter was connected to a VacLok syringe. The aspiration catheter was subsequently removed under constant aspiration. The guide catheter was aspirated for debris. Right internal carotid angiograms with magnified frontal lateral views of the head showed complete recanalization of the right ACA vascular tree. Right internal carotid artery angiograms with full view of the head in frontal and lateral projection show no evidence of thromboembolic complication. Flat panel CT of the head was obtained and post processed in a separate workstation with concurrent attending physician supervision. Selected images were sent to PACS. No evidence hemorrhagic complication. The catheter was subsequently withdrawn. Right common femoral artery angiogram was obtained in right anterior oblique view. The puncture is at the level of the common femoral artery. The artery has normal caliber, adequate for closure device. The sheath was exchanged over the wire for a Perclose prostyle which was utilized for access  closure. Immediate hemostasis was achieved. IMPRESSION: 1. Successful mechanical thrombectomy for treatment of an intracranial right internal carotid artery occlusion achieving complete recanalization. 2. No thromboembolic or hemorrhagic complication. PLAN: Transfer  to ICU for continued post stroke care. Electronically Signed   By: Pedro Earls M.D.   On: 07/17/2022 18:50     CT HEAD WO CONTRAST (5MM) Result Date: 07/17/2022 CLINICAL DATA:  Status post revascularization of right ICA occlusion EXAM: CT HEAD WITHOUT CONTRAST TECHNIQUE: Contiguous axial images were obtained from the base of the skull through the vertex without intravenous contrast. RADIATION DOSE REDUCTION: This exam was performed according to the departmental dose-optimization program which includes automated exposure control, adjustment of the mA and/or kV according to patient size and/or use of iterative reconstruction technique. COMPARISON:  CT head without contrast 07/17/22 at 12:36 p.m. FINDINGS: Brain: No acute hemorrhage is present. The right caudate head is now hypodense. Diffuse hypoattenuation is present within the right lentiform nucleus. Gray-white differentiation in the insular ribbon is improved. Gray-white differentiation at the right temporal tip is improved as well. This may have been artifactual in the past. No new infarcts are present. Mild right-sided edema is present with slight asymmetric effacement of the sulci. No extra-axial hemorrhage is present. Vascular: Hyperdense vessels are present bilaterally following contrast. This is still slightly more prominent on the right. Skull: Calvarium is intact. Right supraorbital scalp soft tissue swelling is present. No underlying fracture is present. Sinuses/Orbits: Mild mucosal thickening is present throughout the ethmoid air cells and frontoethmoidal recess bilaterally. Secretions are present in the right sphenoid sinus. Bilateral lens replacements are noted.  Globes and orbits are otherwise unremarkable. IMPRESSION: 1. Evolving right MCA territory infarct appears to be limited to the right basal ganglia. 2. No acute hemorrhage. 3. No expansion of the infarct. 4. Right supraorbital scalp soft tissue swelling without underlying fracture. Electronically Signed   By: San Morelle M.D.   On: 07/17/2022 18:01   DG CHEST PORT 1 VIEW Result Date: 07/17/2022 CLINICAL DATA:  Intubation. EXAM: PORTABLE CHEST 1 VIEW COMPARISON:  Chest radiograph 03/25/2021 FINDINGS: Endotracheal tube tip projects 6.6 cm above the carina. Heart is normal in size. No focal consolidation, pleural effusion, or pneumothorax. IMPRESSION: Endotracheal tube tip projects 6.6 cm above the carina. No acute cardiopulmonary abnormality. Electronically Signed   By: Ileana Roup M.D.   On: 07/17/2022 16:08   CT C-SPINE NO CHARGE Result Date: 07/17/2022 CLINICAL DATA:  Fall EXAM: CT CERVICAL SPINE WITHOUT CONTRAST TECHNIQUE: Multidetector CT imaging of the cervical spine was performed without intravenous contrast. Multiplanar CT image reconstructions were also generated. RADIATION DOSE REDUCTION: This exam was performed according to the departmental dose-optimization program which includes automated exposure control, adjustment of the mA and/or kV according to patient size and/or use of iterative reconstruction technique. COMPARISON:  None Available. FINDINGS: Alignment: Facet joints are aligned without dislocation or traumatic listhesis. Dens and lateral masses are aligned. Skull base and vertebrae: No acute fracture. No primary bone lesion or focal pathologic process. Soft tissues and spinal canal: No prevertebral fluid or swelling. No visible canal hematoma. Disc levels: Moderate degenerative disc disease from C4-5 through C7-T1. Upper chest: Mild emphysematous changes at the lung apices. Other: None. IMPRESSION: 1. No acute fracture or traumatic listhesis of the cervical spine. 2. Moderate  degenerative disc disease from C4-5 through C7-T1. Electronically Signed   By: Davina Poke D.O.   On: 07/17/2022 13:31   CT ANGIO HEAD NECK W WO CM (CODE STROKE) Result Date: 07/17/2022 CLINICAL DATA:  Code stroke. Left-sided weakness. Right MCA territory infarct. EXAM: CT ANGIOGRAPHY HEAD AND NECK TECHNIQUE: Multidetector CT imaging of the head and neck was performed using  the standard protocol during bolus administration of intravenous contrast. Multiplanar CT image reconstructions and MIPs were obtained to evaluate the vascular anatomy. Carotid stenosis measurements (when applicable) are obtained utilizing NASCET criteria, using the distal internal carotid diameter as the denominator. RADIATION DOSE REDUCTION: This exam was performed according to the departmental dose-optimization program which includes automated exposure control, adjustment of the mA and/or kV according to patient size and/or use of iterative reconstruction technique. CONTRAST:  27m OMNIPAQUE IOHEXOL 350 MG/ML SOLN COMPARISON:  CT head without contrast 07/17/2022. FINDINGS: CTA NECK FINDINGS Aortic arch: Distal arch demonstrates mild calcification. Calcifications present the origin left subclavian artery without significant stenosis. The right scratched at the innominate artery and left common carotid artery origins are not included on the study. Right carotid system: Right common carotid artery is within normal limits. Atherosclerotic calcifications are present at the bifurcation and proximal right ICA without a significant stenosis relative to the more distal vessel. Left carotid system: The left common carotid artery is within normal limits. Bifurcation is unremarkable. Cervical left ICA is normal. Vertebral arteries: The left vertebral artery is the dominant vessel. Both vertebral arteries originate from the subclavian arteries without significant stenosis. No significant stenosis is present in either vertebral artery in the neck.  Skeleton: Typical degenerative changes are present in the cervical spine most evident C4-5, C5-6 and C6-7. No focal osseous lesions are present. Other neck: Soft tissues the neck are otherwise unremarkable. Salivary glands are within normal limits. Thyroid is normal. No significant adenopathy is present. No focal mucosal or submucosal lesions are present. Upper chest: Centrilobular and paraseptal emphysematous changes are present. No nodule or mass lesion is present. No significant airspace consolidation is present. Thoracic inlet is within normal limits. Review of the MIP images confirms the above findings CTA HEAD FINDINGS Anterior circulation: The left internal carotid artery is within normal limits the skull base to the ICA terminus. The right internal carotid artery is within normal limits in the petrous and cavernous segments. The artery is occluded at the level of the posterior communicating artery. The proximal scratched at the right M1 segment is occluded. Contrast is present in the right A1 segment which appears to be the dominant vessel to the level of the thrombus. A smaller left A1 segment is present. Anterior communicating artery is present. Both A2 segments fill normally. There is marked attenuation of a posterior right M2 branch which appears to fill, presumably from PL collateral vessels. Other proximal MCA branches are not visualized. Left MCA branch vessels and bilateral ACA branch vessels are within normal limits proximally. There is some distal attenuation of branch vessels. Retrograde Posterior circulation: The left vertebral artery is dominant vessel. PICA origins are visualized and normal. Vertebrobasilar junction and basilar artery are patent. Basilar artery is small. Right posterior cerebral artery is of fetal type. Left posterior cerebral artery originates from the basilar tip. PCA branch vessels are within normal limits proximally. There is some attenuation of distal PCA branch vessels  bilaterally. Venous sinuses: The dural sinuses are patent. The straight sinus and deep cerebral veins are intact. Cortical veins are within normal limits. No significant vascular malformation is evident. Anatomic variants: Fetal type right posterior cerebral artery Review of the MIP images confirms the above findings IMPRESSION: 1. 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 2. Marked attenuation of a posterior right M2 branch which appears to fill, presumably from PL collateral vessels. 3. Contrast is present in the right  A1 segment which appears to be the dominant vessel to the level of the thrombus. 4. A smaller left A1 segment is present. 5. Both A2 segments fill normally. 6. Mild distal small vessel disease without other significant proximal stenosis, aneurysm, or branch vessel occlusion within the Circle of Willis. 7. Atherosclerotic changes at the right carotid bifurcation and proximal right ICA without significant stenosis relative to the more distal vessel. 8. Fetal type right posterior cerebral artery. 9. Aortic Atherosclerosis (ICD10-I70.0) and Emphysema (ICD10-J43.9). Electronically Signed   By: San Morelle M.D.   On: 07/17/2022 13:19   CT HEAD CODE STROKE WO CONTRAST Result Date: 07/17/2022 CLINICAL DATA:  Code stroke. Neuro deficit, acute, stroke suspected. EXAM: CT HEAD WITHOUT CONTRAST TECHNIQUE: Contiguous axial images were obtained from the base of the skull through the vertex without intravenous contrast. RADIATION DOSE REDUCTION: This exam was performed according to the departmental dose-optimization program which includes automated exposure control, adjustment of the mA and/or kV according to patient size and/or use of iterative reconstruction technique. COMPARISON:  None Available. FINDINGS: Brain: Loss of gray-white differentiation noted in the right insular cortex and lateral aspect of the right lentiform nucleus. Right  caudate head is intact. There is some indistinct no CIS of the posterior parietal cortex at the level of the basal ganglia. No super ganglionic cortical abnormalities are present. Loss of gray-white differentiation noted within the right temporal tip. No acute hemorrhage is present. The ventricles are of normal size. No significant extraaxial fluid collection is present. Insert normal brainstem Vascular: A hyperdense right ICA terminus and proximal right M1 segment noted. Skull: Calvarium is intact. No focal lytic or blastic lesions are present. No significant extracranial soft tissue lesion is present. Sinuses/Orbits: Mild mucosal thickening is present within anterior ethmoid air cells and inferior frontal sinuses bilaterally. No fluid levels are present. The paranasal sinuses and mastoid air cells are otherwise clear. Bilateral lens replacements are noted. Globes and orbits are otherwise unremarkable. Other: None ASPECTS ([place] Stroke Program Early CT Score) - Ganglionic level infarction (caudate, lentiform nuclei, internal capsule, insula, M1-M3 cortex): 4/7 - Supraganglionic infarction (M4-M6 cortex): 3/3 Total score (0-10 with 10 being normal): 7/10 IMPRESSION: 1. Loss of gray-white differentiation in the right insular cortex, lateral aspect of the right lentiform nucleus, and right temporal tip consistent with acute infarct. 2. No acute hemorrhage. 3. Hyperdense right ICA terminus and proximal right M1 segment consistent with acute thrombus. 4. Aspects is 7/10. The above was relayed via text pager to [facility] on 07/17/2022 at 12:51 . Electronically Signed   By: San Morelle M.D.   On: 07/17/2022 12:51    12-lead ECG SR/SB All prior EKG's in EPIC reviewed with no documented atrial fibrillation  Telemetry SR/SB  Assessment and Plan:  1. Cryptogenic stroke The patient presents with cryptogenic stroke.    On our arrival today the patient markedly lethargic, arousing only briefly to  firm sternal rub, BP, HR, telemetry are stable, sats also OK Informed by nursing staff that this is a clear change and earlier was following commands and passed his wallow test  Called Dr. Leonie Man, suspect 2/2 seroquel though would re-assess the patient. Dr. Quentin Ore d/w Dr. Leonie Man, unable to pursue implant with this change in status   In follow up Dr. Leonie Man reports the patient more alert, still sleepy, but improved with plans to still move to rehab this afternoon. Dr. Quentin Ore is in a case, unlikely to be completed and revisit for loop today. Offered to place  [facility] tomorrow in [place], he was agreeable to the plan and I will make out patient EP follow up    Baldwin [place], PA-C 07/22/2022

## 2022-07-22 NOTE — Progress Notes (Signed)
Physical Therapy Treatment Patient Details Name: Douglas Cox MRN: [id number] DOB: 02-14-1951 Today's Date: 07/22/2022   History of Present Illness Douglas Cox is an 71 y.o. 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 PMHx of cataracts, celiac disease and retinal detachment    PT Comments    Pt received supine and agreeable to session with continued progress towards acute goals. Pt grossly min assist for all OOB mobility, however pt needing max cues for safety awareness, RW management and cues for navigation, as pt needing constant verbal repetition of directions. Pt continues to demonstrate impulsivity attempting to stand before RW in front of him. Pt continues to be limited by impaired balance/postural reactions, fatigue, and impaired cognition. Current plan remains appropriate to address deficits and maximize functional independence and decrease caregiver burden. Pt continues to benefit from skilled PT services to progress toward functional mobility goals.    Recommendations for follow up therapy are one component of a multi-disciplinary discharge planning process, led by the attending physician.  Recommendations may be updated based on patient status, additional functional criteria and insurance authorization.  Follow Up Recommendations  Acute inpatient rehab (3hours/day)     Assistance Recommended at Discharge Frequent or constant Supervision/Assistance  Patient can return home with the following A little help with walking and/or transfers;A little help  with bathing/dressing/bathroom;Assistance with cooking/housework;Assist for transportation;Help with stairs or ramp for entrance   Equipment Recommendations  Other (comment) (defer to post acute)    Recommendations for Other Services       Precautions / Restrictions Precautions Precautions: Fall Precaution Comments: waist restraint Restrictions Weight Bearing Restrictions: No     Mobility  Bed Mobility Overal bed mobility: Needs Assistance Bed Mobility: Supine to Sit, Sit to Supine     Supine to sit: Min assist Sit to supine: Supervision   General bed mobility comments: min assist to elevate trunk    Transfers Overall transfer level: Needs assistance Equipment used: Rolling walker (2 wheels) Transfers: Sit to/from Stand Sit to Stand: Min assist           General transfer comment: assist to rise and steady, lines/leads management    Ambulation/Gait Ambulation/Gait assistance: Min assist Gait Distance (Feet): 96 Feet Assistive device: Rolling walker (2 wheels) Gait Pattern/deviations: Step-through pattern, Decreased stride length, Trunk flexed Gait velocity: decr     General Gait Details: assist to steady, no LOB, laps in room with pt trying to open door each time aproaching despite repeated cues and reminders to stay in room, hands on management of RW ofor directions   Stairs             Wheelchair Mobility    Modified Rankin (Stroke Patients Only) Modified Rankin (Stroke Patients Only) Pre-Morbid Rankin Score: No symptoms Modified Rankin: Moderately severe disability     Balance Overall balance assessment: Needs assistance Sitting-balance support: No upper extremity supported, Feet supported Sitting balance-Leahy Scale: Fair     Standing balance support: Single extremity supported Standing balance-Leahy Scale: Poor                              Cognition  Arousal/Alertness: Awake/alert Behavior During Therapy: Impulsive, Flat  affect Overall Cognitive Status: Impaired/Different from baseline                                 General Comments: pt awake and alert qwith flat affect making minimal conversation and minimal verbalizations, pt following commands with increased time and repetition        Exercises Other Exercises Other Exercises: seated marching x10, shoulder flexion/extension x10    General Comments        Pertinent Vitals/Pain Pain Assessment Pain Assessment: Faces Faces Pain Scale: No hurt Pain Intervention(s): Monitored during session    Home Living                          Prior Function            PT Goals (current goals can now be found in the care plan section) Acute Rehab PT Goals Patient Stated Goal: home independent PT Goal Formulation: With patient/family Time For Goal Achievement: 08/02/22 Progress towards PT goals: Progressing toward goals    Frequency    Min 4X/week      PT Plan Current plan remains appropriate;Frequency needs to be updated    Co-evaluation              AM-PAC PT "6 Clicks" Mobility   Outcome Measure  Help needed turning from your back to your side while in a flat bed without using bedrails?: A Little Help needed moving from lying on your back to sitting on the side of a flat bed without using bedrails?: A Little Help needed moving to and from a bed to a chair (including a wheelchair)?: A Lot Help needed standing up from a chair using your arms (e.g., wheelchair or bedside chair)?: A Lot Help needed to walk in hospital room?: A Lot Help needed climbing 3-5 steps with a railing? : Total 6 Click Score: 13    End of Session Equipment Utilized During Treatment: Gait belt Activity Tolerance: Patient tolerated treatment well Patient left: in bed;with call bell/phone within reach;with bed alarm set;with restraints reapplied (waist restraint in place with bed in chair position) Nurse Communication: Mobility  status PT Visit Diagnosis: Unsteadiness on feet (R26.81);Other abnormalities of gait and mobility (R26.89);Other symptoms and signs involving the nervous system (S81.103)     Time: 1594-5859 PT Time Calculation (min) (ACUTE ONLY): 21 min  Charges:  $Gait Training: 8-22 mins                     Douglas Cox R. PTA Acute Rehabilitation Services Office: [place] 07/22/2022, 10:04 AM

## 2022-07-22 NOTE — Telephone Encounter (Signed)
Please schedule office visit next available to discuss, cancel the procedure as we can discuss timeline of procedure during the office visit. Thanks

## 2022-07-22 NOTE — Procedures (Signed)
Objective Swallowing Evaluation: Type of Study: FEES-Fiberoptic Endoscopic Evaluation of Swallow   Patient Details  Name: Douglas Cox MRN: [id number] Date of Birth: 1951-03-03  Today's Date: 07/22/2022 Time: SLP Start Time (ACUTE ONLY): 1057 -SLP Stop Time (ACUTE ONLY): 1126  SLP Time Calculation (min) (ACUTE ONLY): 29 min   Past Medical History:  Past Medical History:  Diagnosis Date   Celiac disease    Retinal detachment    Past Surgical History:  Past Surgical History:  Procedure Laterality Date   CATARACT EXTRACTION Bilateral    Dr. Valetta Close   EYE SURGERY     FRACTURE SURGERY     GAS INSERTION Right 05/21/2021   Procedure: INSERTION OF GAS - C3F8;  Surgeon: Bernarda Caffey, MD;  Location: [place];  Service: Ophthalmology;  Laterality: Right;   GAS/FLUID EXCHANGE Right 05/21/2021   Procedure: GAS/FLUID EXCHANGE;  Surgeon: Bernarda Caffey, MD;  Location: [place];  Service: Ophthalmology;  Laterality: Right;   HERNIA REPAIR     IR ANGIO INTRA EXTRACRAN SEL INTERNAL CAROTID UNI R MOD SED  07/17/2022   IR PERCUTANEOUS ART THROMBECTOMY/INFUSION INTRACRANIAL INC DIAG ANGIO  07/17/2022   IR US GUIDE VASC ACCESS RIGHT  07/17/2022   PARS PLANA VITRECTOMY Right 05/21/2021   Procedure: PARS PLANA VITRECTOMY WITH 25 GAUGE;  Surgeon: Bernarda Caffey, MD;  Location: [facility];  Service: Ophthalmology;  Laterality: Right;   PERFLUORONE INJECTION Right 05/21/2021   Procedure: PERFLUORONE INJECTION;  Surgeon: Bernarda Caffey, MD;  Location: [place];  Service: Ophthalmology;  Laterality: Right;   PHOTOCOAGULATION WITH LASER Right 05/21/2021   Procedure: PHOTOCOAGULATION WITH LASER;  Surgeon: Bernarda Caffey, MD;  Location: [place];  Service: Ophthalmology;  Laterality: Right;   RADIOLOGY WITH ANESTHESIA N/A 07/17/2022   Procedure: IR WITH ANESTHESIA;  Surgeon: Radiologist, Medication, MD;  Location: [facility];  Service: Radiology;  Laterality: N/A;   RETINAL DETACHMENT SURGERY     HPI: [name] is  a 71 y.o. male who presented to the ED with acute onset of left hemiplegia, right gaze deviation and left hemineglect while working in his garage.  TNK given and recanalization acheived with thrombectomy.  Later at 1800 noted right sided weakness and new CT Head and CTA head and neck reordered and negative for reocclusion. MRI today shows bilateral basal ganglia infarcts. ASA 64m started 12/17  Pt with ETT for thrombectomy and overnight.  Extubated 12/17. FEES on 07/19/22 recommending continue NPO; pt pulled cotrack 12/21   Subjective: Awake, alert    Recommendations for follow up therapy are one component of a multi-disciplinary discharge planning process, led by the attending physician.  Recommendations may be updated based on patient status, additional functional criteria and insurance authorization.  Assessment / Plan / Recommendation     07/22/2022   12:09 PM  Clinical Impressions  Clinical Impression Pt presesnts with a moderate oropharyngeal dysphagia c/b delayed swallow initiation, reduced base of tongue retraction, incomplete laryngeal closure, and diminished sensation.  These deficits resulted in penetration of all consistencies of liquid prior to and during the swallow with inconsistent cough response.  Cued cough was very weak and not beneficial to clear/penetration aspiration.  Reflexive cough when it occurred seemed at least partially beneficial.  Honey thick liquid by spoon prevented penetration or aspiration.  Today there was decreased pharyngeal edema and pharyngeal spaces were present to allow for flow of residue outside of laryngeal vestibule.  Penetration/aspiration of pharyngeal residue was not observed today marking a significant improvement from FEES 12/18.  There was some intrusion of HTL residue at arytenoids, but this was cleared with spontaneous subsequent swallow.  Residuals were cleared with spontaneous subsequent swallow. With regular solid there was premature spillage and  shallow penetration over tip of epiglottis prior to swallow which cleared during swallow.    Recommend mechanical soft solids and honey thick liquid by spoon.   SLP Visit Diagnosis Dysphagia, oropharyngeal phase (R13.12)  Impact on safety and function Moderate aspiration risk         07/22/2022   12:09 PM  Treatment Recommendations  Treatment Recommendations Therapy as outlined in treatment plan below        07/22/2022   12:14 PM  Prognosis  Prognosis for Safe Diet Advancement Good       07/22/2022   12:09 PM  Diet Recommendations  SLP Diet Recommendations Dysphagia 3 (Mech soft) solids;Honey thick liquids  Liquid Administration via Spoon  Medication Administration Whole meds with puree  Compensations Slow rate;Small sips/bites  Postural Changes Seated upright at 90 degrees         07/22/2022   12:09 PM  Other Recommendations  Oral Care Recommendations Oral care BID  Other Recommendations Order thickener from pharmacy  Follow Up Recommendations Acute inpatient rehab (3hours/day)  Functional Status Assessment Patient has had a recent decline in their functional status and demonstrates the ability to make significant improvements in function in a reasonable and predictable amount of time.       07/22/2022   12:09 PM  Frequency and Duration   Speech Therapy Frequency (ACUTE ONLY) min 2x/week  Treatment Duration 2 weeks         07/22/2022   12:05 PM  Oral Phase  Oral Phase Impaired  Oral - Honey Teaspoon WFL  Oral - Honey Cup Premature spillage  Oral - Nectar Straw Premature spillage  Oral - Thin Straw Premature spillage  Oral - Puree Premature spillage  Oral - Regular Premature spillage       07/22/2022   12:06 PM  Pharyngeal Phase  Pharyngeal Phase Impaired  Pharyngeal- Honey Teaspoon WFL  Pharyngeal Material does not enter airway  Pharyngeal- Honey Cup Penetration/Aspiration during swallow;Delayed swallow initiation-pyriform sinuses;Reduced  airway/laryngeal closure;Reduced tongue base retraction  Pharyngeal Material enters airway, passes BELOW cords and not ejected out despite cough attempt by patient  Pharyngeal- Nectar Straw Penetration/Aspiration before swallow;Penetration/Aspiration during swallow;Reduced airway/laryngeal closure;Reduced tongue base retraction;Delayed swallow initiation-pyriform sinuses  Pharyngeal Material enters airway, passes BELOW cords and not ejected out despite cough attempt by patient  Pharyngeal- Thin Straw Reduced airway/laryngeal closure;Penetration/Aspiration during swallow;Penetration/Aspiration before swallow  Pharyngeal Material enters airway, passes BELOW cords without attempt by patient to eject out (silent aspiration)  Pharyngeal- Puree Delayed swallow initiation-vallecula;Reduced tongue base retraction  Pharyngeal Material does not enter airway  Pharyngeal- Regular Delayed swallow initiation-pyriform sinuses;Reduced tongue base retraction;Penetration/Aspiration before swallow  Pharyngeal Material enters airway, remains ABOVE vocal cords then ejected out        07/22/2022   12:09 PM  Cervical Esophageal Phase   Cervical Esophageal Phase [facility]     Celedonio Savage, [place], [place] Acute Rehabilitation Services Office: [phone] 07/22/2022, 12:15 PM

## 2022-07-22 NOTE — H&P (Addendum)
Physical Medicine and Rehabilitation Admission H&P    Chief Complaint  Patient presents with   Code Stroke   : HPI: Douglas Cox is a 71 year old right-handed male with history of celiac disease and retinal detachment, quit smoking 16 years ago.  Per chart review patient lives with spouse.  Two-level home half bath on main level with bed and bath upstairs.  Independent prior to admission and driving.  Presented 07/17/2022 with acute onset of left-sided weakness as well as right frontal headache.  Cranial CT scan showed loss of gray-white differentiation in the right insular cortex lateral aspect of right lentiform nucleus, and right temporal tip consistent with acute infarction.  No acute hemorrhage.  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  Status post TNK as well as recanalization per interventional radiology.  MRI follow-up showed acute/subacute nonhemorrhagic infarct of the right caudate head and lentiform nucleus as noted on previous CT.  Echocardiogram with ejection fraction of 60 to 65%.  Admission chemistries unremarkable except glucose 110, hemoglobin A1c 5.7, SARS coronavirus positive.  Currently maintained on aspirin for CVA prophylaxis.  Airborne/contact precautions for positive COVID.  Patient is initially n.p.o. with alternative means of nutritional support and diet advanced 07/22/2022 to mechanical soft honey thick liquid by spoon.  He pulled out his cortrak 12/21.  Placed on Unasyn suspect possible pneumonia latest chest x-ray 07/20/2022 showed no convincing evidence of aspiration.  No suspicious infiltrates.  Bouts of agitation and restlessness placed on Seroquel.  Cardiology unable to place loop recorder due sedation. Plan by cardiology for wearable heart rhythm monitor tomorrow with f/u in EP clinic as outpatients.   Therapy evaluations completed due to patient decreased functional mobility  left-sided weakness and dysphagia was admitted for a comprehensive rehab program.  ROS: limited by aphasia  Past Medical History:  Diagnosis Date   Celiac disease    Retinal detachment    Past Surgical History:  Procedure Laterality Date   CATARACT EXTRACTION Bilateral    Dr. Valetta Close   EYE SURGERY     FRACTURE SURGERY     GAS INSERTION Right 05/21/2021   Procedure: INSERTION OF GAS - C3F8;  Surgeon: Bernarda Caffey, MD;  Location: [facility];  Service: Ophthalmology;  Laterality: Right;   GAS/FLUID EXCHANGE Right 05/21/2021   Procedure: GAS/FLUID EXCHANGE;  Surgeon: Bernarda Caffey, MD;  Location: [facility];  Service: Ophthalmology;  Laterality: Right;   HERNIA REPAIR     IR ANGIO INTRA EXTRACRAN SEL INTERNAL CAROTID UNI R MOD SED  07/17/2022   IR PERCUTANEOUS ART THROMBECTOMY/INFUSION INTRACRANIAL INC DIAG ANGIO  07/17/2022   IR US GUIDE VASC ACCESS RIGHT  07/17/2022   PARS PLANA VITRECTOMY Right 05/21/2021   Procedure: PARS PLANA VITRECTOMY WITH 25 GAUGE;  Surgeon: Bernarda Caffey, MD;  Location: [facility];  Service: Ophthalmology;  Laterality: Right;   PERFLUORONE INJECTION Right 05/21/2021   Procedure: PERFLUORONE INJECTION;  Surgeon: Bernarda Caffey, MD;  Location: [place];  Service: Ophthalmology;  Laterality: Right;   PHOTOCOAGULATION WITH LASER Right 05/21/2021   Procedure: PHOTOCOAGULATION WITH LASER;  Surgeon: Bernarda Caffey, MD;  Location: [facility] [facility];  Service: Ophthalmology;  Laterality: Right;   RADIOLOGY WITH ANESTHESIA N/A 07/17/2022   Procedure: IR WITH ANESTHESIA;  Surgeon: Radiologist, Medication, MD;  Location: [place];  Service: Radiology;  Laterality: N/A;   RETINAL DETACHMENT SURGERY     No family history on file. Social History:  reports that he  quit smoking about 16 years ago. His smoking use included cigarettes. He has never used smokeless tobacco. He reports current alcohol use of about 4.0 - 5.0 standard drinks of alcohol per week. He reports current drug use. Frequency: 7.00 times per  week. Drug: Marijuana. Allergies:  Allergies  Allergen Reactions   Nitrous Oxide Other (See Comments)    Can cause blindness in right eye    Codone [Hydrocodone] Hives   Tylenol [Acetaminophen] Hives   Clarithromycin Anxiety   Gluten Meal Other (See Comments)    Stomach issues   Lactose Intolerance (Gi) Other (See Comments)    GI issues   Medications Prior to Admission  Medication Sig Dispense Refill   [START ON 07/23/2022] aspirin EC 81 MG tablet Take 1 tablet (81 mg total) by mouth daily. Swallow whole. 30 tablet 12   famotidine (PEPCID) 20 MG tablet Place 1 tablet (20 mg total) into feeding tube 2 (two) times daily.     QUEtiapine (SEROQUEL) 25 MG tablet Take 0.5 tablets (12.5 mg total) by mouth 2 (two) times daily.     [START ON 07/23/2022] QUEtiapine (SEROQUEL) 25 MG tablet Take 0.5 tablets (12.5 mg total) by mouth at bedtime.     [START ON 07/23/2022] rosuvastatin (CRESTOR) 20 MG tablet Place 1 tablet (20 mg total) into feeding tube daily.     sodium chloride 0.9 % infusion Inject 1,000 mLs into the vein continuous.  0     Home: Home Living Family/patient expects to be discharged to:: Skilled nursing facility Living Arrangements: Spouse/significant other Available Help at Discharge: Family, Available 24 hours/day Type of Home: House Home Access: Stairs to enter [employer] of Steps: 3 Entrance Stairs-Rails: None Home Layout: Two level, 1/2 bath on main level, Bed/bath upstairs Alternate Level Stairs-Number of Steps: flight Alternate Level Stairs-Rails: Left Bathroom Shower/Tub: Chiropodist: Standard Bathroom Accessibility: Yes Home Equipment: None Additional Comments: full bath on second floor  Lives With: Spouse   Functional History: Prior Function Prior Level of Function : Independent/Modified Independent, Driving   Functional Status:  Mobility: Bed Mobility Overal bed mobility: Needs Assistance Bed Mobility: Supine to Sit, Sit  to Supine Supine to sit: Mod assist Sit to supine: Supervision General bed mobility comments: Tried verbal cues , tactile cues, gestures, and assist but pt not following commands well. Pt pushing to lay down with attempts to sit.  Able to get pt sitting EOB but then he shifted self up in bed, returned to supine and covered up. Transfers Overall transfer level: Needs assistance Equipment used: Rolling walker (2 wheels) Transfers: Sit to/from Stand Sit to Stand: Min assist General transfer comment: assist to rise and steady, lines/leads management Ambulation/Gait Ambulation/Gait assistance: Min assist Gait Distance (Feet): 30 Feet Assistive device: Rolling walker (2 wheels) Gait Pattern/deviations: Step-through pattern, Decreased stride length, Trunk flexed General Gait Details: assist to steady, correct x1 posterior bias, and RW management. Pt urinary incontinent during gait and becoming frantic, tries to impulsively race back to bed Gait velocity: decr Gait velocity interpretation: <1.31 ft/sec, indicative of household ambulator   ADL: ADL Overall ADL's : Needs assistance/impaired Eating/Feeding: Total assistance, NPO Eating/Feeding Details (indicate cue type and reason): cortrak Grooming: Maximal assistance, Sitting Upper Body Bathing: Maximal assistance, Sitting Lower Body Bathing: Maximal assistance Lower Body Bathing Details (indicate cue type and reason): min A sit<>stand Upper Body Dressing : Maximal assistance, Sitting Lower Body Dressing: Maximal assistance Lower Body Dressing Details (indicate cue type and reason): min A sit<>stand Toilet Transfer:  Minimal assistance, +2 for safety/equipment Toilet Transfer Details (indicate cue type and reason): bed>window>door>bed Toileting- Clothing Manipulation and Hygiene: Maximal assistance Toileting - Clothing Manipulation Details (indicate cue type and reason): min A  sit<>stand   Cognition: Cognition Overall Cognitive Status:  Impaired/Different from baseline Orientation Level: Disoriented to place, Disoriented to time, Disoriented to situation, Oriented to person Cognition Arousal/Alertness: Lethargic Behavior During Therapy: Impulsive Overall Cognitive Status: Impaired/Different from baseline General Comments: Wife reports pt restless and trying to get OOB earlier but now sleeping.  She still would like therapy to try.  Able to arouse pt but pt not following commands and trying to lay back down once sitting.  Did note received Seroquel at 13:00. Difficult to assess due to: Impaired communication Physical Exam: Blood pressure 129/73, pulse (!) 53, temperature 98.7 F (37.1 C), temperature source Oral, resp. rate 18, SpO2 95 %. Physical Exam   General:  No apparent distress, in bed HEENT: Head is normocephalic, atraumatic, PERRLA, EOMI, sclera anicteric, oral mucosa pink and moist, dentition intact, ext ear canals clear,  Neck: Supple without JVD or lymphadenopathy Heart: Reg rate and rhythm. No murmurs rubs or gallops Chest: CTA bilaterally without wheezes, rales, or rhonchi; no distress Abdomen: Soft, non-tender, non-distended, bowel sounds positive. Extremities: No clubbing, cyanosis, or edema. Pulses are 2+ Psych: Pt's affect is appropriate. Pt is cooperative Skin: Clean and intact without signs of breakdown Neuro: Eyes open, alert and awake, able to state his name and hospital, follows simple commands difficulty with more complex commands, minimal verbal output.  Able to name 1 of 3 items.  Was not able to repeat.  PERRLA.  EOMI.  Able to turn head in both directions.  Sensation intact light touch in all 4 extremities Difficulty with MMT however however appears to be generally 4-/5 in right upper extremity, 3-4/5 in left upper extremity, 4 - in right lower extremity,2-3 out of 5 in left lower extremity Musculoskeletal: No joint swelling or tenderness noted, no abnormal tone noted, Normal muscle  bulk   Results for orders placed or performed during the hospital encounter of 07/17/22 (from the past 48 hour(s))  Glucose, capillary     Status: Abnormal   Collection Time: 07/20/22  7:39 PM  Result Value Ref Range   Glucose-Capillary 114 (H) 70 - 99 mg/dL    Comment: Glucose reference range applies only to samples taken after fasting for at least 8 hours.  Glucose, capillary     Status: Abnormal   Collection Time: 07/21/22 12:01 AM  Result Value Ref Range   Glucose-Capillary 107 (H) 70 - 99 mg/dL    Comment: Glucose reference range applies only to samples taken after fasting for at least 8 hours.  Glucose, capillary     Status: Abnormal   Collection Time: 07/21/22  9:35 AM  Result Value Ref Range   Glucose-Capillary 121 (H) 70 - 99 mg/dL    Comment: Glucose reference range applies only to samples taken after fasting for at least 8 hours.  Glucose, capillary     Status: Abnormal   Collection Time: 07/21/22 11:56 AM  Result Value Ref Range   Glucose-Capillary 132 (H) 70 - 99 mg/dL    Comment: Glucose reference range applies only to samples taken after fasting for at least 8 hours.  Glucose, capillary     Status: Abnormal   Collection Time: 07/21/22  4:46 PM  Result Value Ref Range   Glucose-Capillary 123 (H) 70 - 99 mg/dL    Comment: Glucose reference  range applies only to samples taken after fasting for at least 8 hours.  Glucose, capillary     Status: Abnormal   Collection Time: 07/21/22  8:22 PM  Result Value Ref Range   Glucose-Capillary 113 (H) 70 - 99 mg/dL    Comment: Glucose reference range applies only to samples taken after fasting for at least 8 hours.  Glucose, capillary     Status: Abnormal   Collection Time: 07/22/22 12:06 AM  Result Value Ref Range   Glucose-Capillary 120 (H) 70 - 99 mg/dL    Comment: Glucose reference range applies only to samples taken after fasting for at least 8 hours.  Glucose, capillary     Status: Abnormal   Collection Time: 07/22/22   4:15 AM  Result Value Ref Range   Glucose-Capillary 115 (H) 70 - 99 mg/dL    Comment: Glucose reference range applies only to samples taken after fasting for at least 8 hours.  Glucose, capillary     Status: None   Collection Time: 07/22/22  9:08 AM  Result Value Ref Range   Glucose-Capillary 97 70 - 99 mg/dL    Comment: Glucose reference range applies only to samples taken after fasting for at least 8 hours.   Comment 1 Notify RN    Comment 2 Document in Chart   Glucose, capillary     Status: Abnormal   Collection Time: 07/22/22 12:33 PM  Result Value Ref Range   Glucose-Capillary 122 (H) 70 - 99 mg/dL    Comment: Glucose reference range applies only to samples taken after fasting for at least 8 hours.   Comment 1 Notify RN    Comment 2 Document in Chart   Glucose, capillary     Status: Abnormal   Collection Time: 07/22/22  1:28 PM  Result Value Ref Range   Glucose-Capillary 121 (H) 70 - 99 mg/dL    Comment: Glucose reference range applies only to samples taken after fasting for at least 8 hours.   No results found.    Blood pressure 129/73, pulse (!) 53, temperature 98.7 F (37.1 C), temperature source Oral, resp. rate 18, SpO2 95 %.  Medical Problem List and Plan: 1. 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  Plan for loop recorder  -patient may shower  -ELOS/Goals: 12-14 days  -Admit to CIR PT/OT/SLP  -Needs wearable heart rhythm monitor per cardiology 2.  Antithrombotics: -DVT/anticoagulation:  Mechanical: Antiembolism stockings, thigh (TED hose) Bilateral lower extremities  -antiplatelet therapy: Aspirin 81 mg daily 3. Pain Management: Tylenol as needed 4. Mood/Behavior/Sleep: Provide emotional support  -antipsychotic agents: Seroquel 12.5 mg twice daily 5. Neuropsych/cognition: This patient is not capable of making decisions on his own  behalf. 6. Skin/Wound Care: Routine skin checks 7. Fluids/Electrolytes/Nutrition: Routine in and outs with follow-up chemistries 8.  Dysphagia.  Diet advanced to mechanical soft honey thick liquids by spoon.  Awaiting plan to continue nasogastric tube for nutritional support and hydration.  Follow-up speech therapy  -On IVF for 12 hours. Assess need for continuation of IVF 9.  COVID-positive.  Airborne/contact precautions.  Patient is on airborne precautions to 12/22 and stop 10.  Hyperlipidemia.  Crestor 45m 11. DM 2? HGB A1C 5.7. GBGs have not been elevated. 12. HTN. Not on any home medication. BP goal less than 180 with Long term goal normotensive    DCathlyn Parsons PA-C 07/22/2022  I have personally performed  a face to face diagnostic evaluation of this patient and formulated the key components of the plan.  Additionally, I have personally reviewed laboratory data, imaging studies, as well as relevant notes and concur with the physician assistant's documentation above.  The patient's status has not changed from the original H&P.  Any changes in documentation from the acute care chart have been noted above.  Jennye Boroughs, MD, [facility]

## 2022-07-22 NOTE — H&P (Incomplete)
Physical Medicine and Rehabilitation Admission H&P    Chief Complaint  Patient presents with   Code Stroke  : HPI: Douglas Cox is a 71 year old right-handed male with history of celiac disease and retinal detachment, quit smoking 16 years ago.  Per chart review patient lives with spouse.  Two-level home half bath on main level with bed and bath upstairs.  Independent prior to admission and driving.  Presented 07/17/2022 with acute onset of left-sided weakness as well as right frontal headache.  Cranial CT scan showed loss of gray-white differentiation in the right insular cortex lateral aspect of right lentiform nucleus, and right temporal tip consistent with acute infarction.  No acute hemorrhage.  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  Status post TNK as well as recanalization per interventional radiology.  MRI follow-up showed acute/subacute nonhemorrhagic infarct of the right caudate head and lentiform nucleus as noted on previous CT.  Echocardiogram with ejection fraction of 60 to 65%.  Admission chemistries unremarkable except glucose 110, hemoglobin A1c 5.7, SARS coronavirus positive.  Currently maintained on aspirin for CVA prophylaxis.  Awaiting plan for loop recorder.  Airborne/contact precautions for positive COVID.  Patient is initially n.p.o. with alternative means of nutritional support and diet advanced 07/22/2022 to mechanical soft honey thick liquid by spoon.  Placed on Unasyn suspect possible pneumonia latest chest x-ray 07/20/2022 showed no convincing evidence of aspiration.  No suspicious infiltrates.  Bouts of agitation and restlessness placed on Seroquel.  Therapy evaluations completed due to patient decreased functional mobility left-sided weakness and dysphagia was admitted for a comprehensive rehab program.  ROS Past Medical History:  Diagnosis Date   Celiac disease    Retinal  detachment    Past Surgical History:  Procedure Laterality Date   CATARACT EXTRACTION Bilateral    Dr. Valetta Close   EYE SURGERY     FRACTURE SURGERY     GAS INSERTION Right 05/21/2021   Procedure: INSERTION OF GAS - C3F8;  Surgeon: Bernarda Caffey, MD;  Location: [place];  Service: Ophthalmology;  Laterality: Right;   GAS/FLUID EXCHANGE Right 05/21/2021   Procedure: GAS/FLUID EXCHANGE;  Surgeon: Bernarda Caffey, MD;  Location: [place];  Service: Ophthalmology;  Laterality: Right;   HERNIA REPAIR     IR ANGIO INTRA EXTRACRAN SEL INTERNAL CAROTID UNI R MOD SED  07/17/2022   IR PERCUTANEOUS ART THROMBECTOMY/INFUSION INTRACRANIAL INC DIAG ANGIO  07/17/2022   IR US GUIDE VASC ACCESS RIGHT  07/17/2022   PARS PLANA VITRECTOMY Right 05/21/2021   Procedure: PARS PLANA VITRECTOMY WITH 25 GAUGE;  Surgeon: Bernarda Caffey, MD;  Location: [place];  Service: Ophthalmology;  Laterality: Right;   PERFLUORONE INJECTION Right 05/21/2021   Procedure: PERFLUORONE INJECTION;  Surgeon: Bernarda Caffey, MD;  Location: [place];  Service: Ophthalmology;  Laterality: Right;   PHOTOCOAGULATION WITH LASER Right 05/21/2021   Procedure: PHOTOCOAGULATION WITH LASER;  Surgeon: Bernarda Caffey, MD;  Location: [place];  Service: Ophthalmology;  Laterality: Right;   RADIOLOGY WITH ANESTHESIA N/A 07/17/2022   Procedure: IR WITH ANESTHESIA;  Surgeon: Radiologist, Medication, MD;  Location: [place];  Service: Radiology;  Laterality: N/A;   RETINAL DETACHMENT SURGERY     History reviewed. No pertinent family history. Social History:  reports that he quit smoking about 16 years ago. His smoking use included cigarettes. He has never used smokeless tobacco. He reports current alcohol use of about 4.0 - 5.0 standard drinks of alcohol  per week. He reports current drug use. Frequency: 7.00 times per week. Drug: Marijuana. Allergies:  Allergies  Allergen Reactions   Codone [Hydrocodone] Hives   Gluten Meal Other (See Comments)    Stomach issues   Lactose  Intolerance (Gi)     GI issues   Nitrous Oxide     Can cause blindness in right eye    Tylenol [Acetaminophen] Hives   Clarithromycin Anxiety   Medications Prior to Admission  Medication Sig Dispense Refill   albuterol (VENTOLIN HFA) 108 (90 Base) MCG/ACT inhaler Inhale 1-2 puffs into the lungs every 6 (six) hours as needed for wheezing or shortness of breath.     cholecalciferol (VITAMIN D3) 25 MCG (1000 UNIT) tablet Take 1,000 Units by mouth daily.     Cyanocobalamin 1000 MCG/ML KIT Inject 1,000 mcg into the muscle every 14 (fourteen) days.     hydrocortisone (ANUSOL-HC) 25 MG suppository Place 25 mg rectally at bedtime.     PRESCRIPTION MEDICATION Apply 1 Application topically 2 (two) times daily as needed (rash). Fluticasone/ketoconazole (compounded)     Probiotic Product (ALIGN PO) Take 1 capsule by mouth daily.     psyllium (METAMUCIL) 58.6 % powder Take 1 packet by mouth daily.     famotidine (PEPCID) 40 MG tablet Take 40 mg by mouth daily. (Patient not taking: Reported on 07/19/2022)        Home: Home Living Family/patient expects to be discharged to:: Skilled nursing facility Living Arrangements: Spouse/significant other Available Help at Discharge: Family, Available 24 hours/day Type of Home: House Home Access: Stairs to enter [employer] of Steps: 3 Entrance Stairs-Rails: None Home Layout: Two level, 1/2 bath on main level, Bed/bath upstairs Alternate Level Stairs-Number of Steps: flight Alternate Level Stairs-Rails: Left Bathroom Shower/Tub: Chiropodist: Standard Bathroom Accessibility: Yes Home Equipment: None Additional Comments: full bath on second floor  Lives With: Spouse   Functional History: Prior Function Prior Level of Function : Independent/Modified Independent, Driving  Functional Status:  Mobility: Bed Mobility Overal bed mobility: Needs Assistance Bed Mobility: Supine to Sit, Sit to Supine Supine to sit: Mod  assist Sit to supine: Supervision General bed mobility comments: Tried verbal cues , tactile cues, gestures, and assist but pt not following commands well. Pt pushing to lay down with attempts to sit.  Able to get pt sitting EOB but then he shifted self up in bed, returned to supine and covered up. Transfers Overall transfer level: Needs assistance Equipment used: Rolling walker (2 wheels) Transfers: Sit to/from Stand Sit to Stand: Min assist General transfer comment: assist to rise and steady, lines/leads management Ambulation/Gait Ambulation/Gait assistance: Min assist Gait Distance (Feet): 30 Feet Assistive device: Rolling walker (2 wheels) Gait Pattern/deviations: Step-through pattern, Decreased stride length, Trunk flexed General Gait Details: assist to steady, correct x1 posterior bias, and RW management. Pt urinary incontinent during gait and becoming frantic, tries to impulsively race back to bed Gait velocity: decr Gait velocity interpretation: <1.31 ft/sec, indicative of household ambulator    ADL: ADL Overall ADL's : Needs assistance/impaired Eating/Feeding: Total assistance, NPO Eating/Feeding Details (indicate cue type and reason): cortrak Grooming: Maximal assistance, Sitting Upper Body Bathing: Maximal assistance, Sitting Lower Body Bathing: Maximal assistance Lower Body Bathing Details (indicate cue type and reason): min A sit<>stand Upper Body Dressing : Maximal assistance, Sitting Lower Body Dressing: Maximal assistance Lower Body Dressing Details (indicate cue type and reason): min A sit<>stand Toilet Transfer: Minimal assistance, +2 for safety/equipment Toilet Transfer Details (indicate cue type  and reason): bed>window>door>bed Toileting- Clothing Manipulation and Hygiene: Maximal assistance Toileting - Clothing Manipulation Details (indicate cue type and reason): min A  sit<>stand  Cognition: Cognition Overall Cognitive Status: Impaired/Different from  baseline Orientation Level: Disoriented to place, Disoriented to time, Disoriented to situation, Oriented to person Cognition Arousal/Alertness: Lethargic Behavior During Therapy: Impulsive Overall Cognitive Status: Impaired/Different from baseline General Comments: Wife reports pt restless and trying to get OOB earlier but now sleeping.  She still would like therapy to try.  Able to arouse pt but pt not following commands and trying to lay back down once sitting.  Did note received Seroquel at 13:00. Difficult to assess due to: Impaired communication  Physical Exam: Blood pressure (!) 144/75, pulse (!) 50, temperature 99 F (37.2 C), temperature source Oral, resp. rate 18, height _0  (1.778 m), weight 71.3 kg, SpO2 94 %. Physical Exam  Results for orders placed or performed during the hospital encounter of 07/17/22 (from the past 48 hour(s))  Glucose, capillary     Status: Abnormal   Collection Time: 07/20/22  8:05 AM  Result Value Ref Range   Glucose-Capillary 114 (H) 70 - 99 mg/dL    Comment: Glucose reference range applies only to samples taken after fasting for at least 8 hours.  Glucose, capillary     Status: Abnormal   Collection Time: 07/20/22 11:34 AM  Result Value Ref Range   Glucose-Capillary 126 (H) 70 - 99 mg/dL    Comment: Glucose reference range applies only to samples taken after fasting for at least 8 hours.  Glucose, capillary     Status: Abnormal   Collection Time: 07/20/22  4:03 PM  Result Value Ref Range   Glucose-Capillary 101 (H) 70 - 99 mg/dL    Comment: Glucose reference range applies only to samples taken after fasting for at least 8 hours.  Glucose, capillary     Status: Abnormal   Collection Time: 07/20/22  7:39 PM  Result Value Ref Range   Glucose-Capillary 114 (H) 70 - 99 mg/dL    Comment: Glucose reference range applies only to samples taken after fasting for at least 8 hours.  Glucose, capillary     Status: Abnormal   Collection Time: 07/21/22  12:01 AM  Result Value Ref Range   Glucose-Capillary 107 (H) 70 - 99 mg/dL    Comment: Glucose reference range applies only to samples taken after fasting for at least 8 hours.  Glucose, capillary     Status: Abnormal   Collection Time: 07/21/22  9:35 AM  Result Value Ref Range   Glucose-Capillary 121 (H) 70 - 99 mg/dL    Comment: Glucose reference range applies only to samples taken after fasting for at least 8 hours.  Glucose, capillary     Status: Abnormal   Collection Time: 07/21/22 11:56 AM  Result Value Ref Range   Glucose-Capillary 132 (H) 70 - 99 mg/dL    Comment: Glucose reference range applies only to samples taken after fasting for at least 8 hours.  Glucose, capillary     Status: Abnormal   Collection Time: 07/21/22  4:46 PM  Result Value Ref Range   Glucose-Capillary 123 (H) 70 - 99 mg/dL    Comment: Glucose reference range applies only to samples taken after fasting for at least 8 hours.  Glucose, capillary     Status: Abnormal   Collection Time: 07/21/22  8:22 PM  Result Value Ref Range   Glucose-Capillary 113 (H) 70 - 99 mg/dL  Comment: Glucose reference range applies only to samples taken after fasting for at least 8 hours.  Glucose, capillary     Status: Abnormal   Collection Time: 07/22/22 12:06 AM  Result Value Ref Range   Glucose-Capillary 120 (H) 70 - 99 mg/dL    Comment: Glucose reference range applies only to samples taken after fasting for at least 8 hours.  Glucose, capillary     Status: Abnormal   Collection Time: 07/22/22  4:15 AM  Result Value Ref Range   Glucose-Capillary 115 (H) 70 - 99 mg/dL    Comment: Glucose reference range applies only to samples taken after fasting for at least 8 hours.   DG CHEST PORT 1 VIEW  Result Date: 07/20/2022 CLINICAL DATA:  Aspiration.  COVID positive. EXAM: PORTABLE CHEST 1 VIEW COMPARISON:  July 17, 2022 FINDINGS: The ET tube has been removed in the interval. A feeding tube terminates below today's film. No  pneumothorax. The heart is borderline to mildly enlarged. The hila and mediastinum are unchanged. No pulmonary nodules or masses. No suspicious infiltrates. Mild atelectasis at the right base. No convincing evidence of aspiration. IMPRESSION: 1. Support apparatus as above. 2. Mild atelectasis at the right base. 3. No convincing evidence of aspiration. Electronically Signed   By: Dorise Bullion III M.D.   On: 07/20/2022 11:10      Blood pressure (!) 144/75, pulse (!) 50, temperature 99 F (37.2 C), temperature source Oral, resp. rate 18, height _0  (1.778 m), weight 71.3 kg, SpO2 94 %.  Medical Problem List and Plan: 1. 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  Plan for loop recorder  -patient may *** shower  -ELOS/Goals: *** 2.  Antithrombotics: -DVT/anticoagulation:  Mechanical: Antiembolism stockings, thigh (TED hose) Bilateral lower extremities  -antiplatelet therapy: Aspirin 81 mg daily 3. Pain Management: Tylenol as needed 4. Mood/Behavior/Sleep: Provide emotional support  -antipsychotic agents: Seroquel 12.5 mg twice daily 5. Neuropsych/cognition: This patient is not capable of making decisions on his own behalf. 6. Skin/Wound Care: Routine skin checks 7. Fluids/Electrolytes/Nutrition: Routine in and outs with follow-up chemistries 8.  Dysphagia.  Diet advanced to mechanical soft honey thick liquids by spoon.  Awaiting plan to continue nasogastric tube for nutritional support and hydration.  Follow-up speech therapy 9.  COVID-positive.  Airborne/contact precautions.  Patient is on airborne precautions to 12/22 and stop 10.  Hyperlipidemia.  Crestor    Cathlyn Parsons, PA-C 07/22/2022

## 2022-07-22 NOTE — Progress Notes (Addendum)
PMR Admission Coordinator Pre-Admission Assessment   Patient: Douglas Cox is an 71 y.o., male MRN: [id number] DOB: 1950-09-29 Height: _0  (177.8 cm) Weight: 71.3 kg   Insurance Information HMO: yes    PPO:      PCP:      IPA:      80/20:      OTHER:  PRIMARY: BCBS Medicare      Policy#: [id number]      Subscriber: pt CM Name: Larene Beach      Phone#: [phone]     Fax#: [phone] Pre-Cert#: tbd on admit      Employer:  Benefits:  Phone #: [phone]     Name:  Eff. Date: 08/02/21     Deduct: $0      Out of Pocket Max: $3950 (met $289.40)      Life Max:  CIR: $335/day for days 1-5      SNF: 20 full days Outpatient:      Co-Pay: $10/visit Home Health: 100%      Co-Pay:  DME: 80%     Co-Pay: 20% Providers:  SECONDARY:       Policy#:      Phone#:    Development worker, community:       Phone#:    The Therapist, art Information Summary" for patients in Inpatient Rehabilitation Facilities with attached "Privacy Act [place] Records" was provided and verbally reviewed with: Family   Emergency Contact Information Contact Information       Name Relation Home Work [place] Spouse     [phone]           Current Medical History  Patient Admitting Diagnosis: CVA    History of Present Illness: Sajid Ruppert is a 71 year old right-handed male with history of celiac disease and retinal detachment, quit smoking 16 years ago, HTN, DM.  Presented 07/17/2022 with acute onset of left-sided weakness as well as right frontal headache.  Cranial CT scan showed loss of gray-white differentiation in the right insular cortex lateral aspect of right lentiform nucleus, and right temporal tip consistent with acute infarction.  No acute hemorrhage.  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  Status post TNK as well as recanalization per interventional radiology.  MRI follow-up showed  acute/subacute nonhemorrhagic infarct of the right caudate head and lentiform nucleus as noted on previous CT.  Echocardiogram with ejection fraction of 60 to 65%.  Admission chemistries unremarkable except glucose 110, hemoglobin A1c 5.7, SARS coronavirus positive.  Currently maintained on aspirin for CVA prophylaxis.  Awaiting plan for loop recorder.  Airborne/contact precautions for positive COVID, through 12/22.  Patient is currently n.p.o. with alternative means of nutritional support.  Placed on Unasyn suspect possible pneumonia, though latest chest x-ray 07/20/2022 showed no convincing evidence of aspiration.  No suspicious infiltrates.  Bouts of agitation and restlessness, so placed on Seroquel.  Therapy evaluations completed and pt was recommended for a comprehensive rehab program.    Complete NIHSS TOTAL: 6   Patient's medical record from [facility] has been reviewed by the rehabilitation admission coordinator and physician.   Past Medical History      Past Medical History:  Diagnosis Date   Celiac disease     Retinal detachment        Has the patient had major surgery during 100 days prior to admission? Yes   Family History   family history is not on file.  Current Medications   Current Facility-Administered Medications:    Ampicillin-Sulbactam (UNASYN) 3 g in sodium chloride 0.9 % 100 mL IVPB, 3 g, Intravenous, Q6H, Bertis Ruddy, RPH, Stopped at 07/22/22 9476   aspirin EC tablet 81 mg, 81 mg, Oral, Daily **OR** aspirin suppository 300 mg, 300 mg, Rectal, Daily **OR** aspirin chewable tablet 81 mg, 81 mg, Per Tube, Daily, Leonie Man, Pramod S, MD, 81 mg at 07/21/22 0830   famotidine (PEPCID) tablet 20 mg, 20 mg, Per Tube, BID, Ollis, Brandi L, NP, 20 mg at 07/21/22 2154   feeding supplement (OSMOLITE 1.5 CAL) liquid 1,000 mL, 1,000 mL, Per Tube, Continuous, Garvin Fila, MD, Stopped at 07/22/22 0401   fentaNYL (SUBLIMAZE) injection 50 mcg, 50 mcg, Intravenous, Q2H PRN,  Eliezer Bottom T, MD, 50 mcg at 07/20/22 0144   fiber supplement (BANATROL TF) liquid 60 mL, 60 mL, Per Tube, BID, Garvin Fila, MD, 60 mL at 07/21/22 2154   hydrALAZINE (APRESOLINE) injection 10 mg, 10 mg, Intravenous, Q4H PRN, Eliezer Bottom T, MD, 10 mg at 07/17/22 2307   labetalol (NORMODYNE) injection 10 mg, 10 mg, Intravenous, Q2H PRN, Eliezer Bottom T, MD, 10 mg at 07/18/22 0019   mupirocin cream (BACTROBAN) 2 %, , Topical, BID, Garvin Fila, MD, Given at 07/21/22 2153   ondansetron (ZOFRAN) injection 4 mg, 4 mg, Intravenous, Q8H PRN, Ollis, Brandi L, NP   Oral care mouth rinse, 15 mL, Mouth Rinse, PRN, Leonie Man, Pramod S, MD   polyethylene glycol (MIRALAX / GLYCOLAX) packet 17 g, 17 g, Per Tube, Daily, Ollis, Brandi L, NP, 17 g at 07/21/22 1304   QUEtiapine (SEROQUEL) tablet 12.5 mg, 12.5 mg, Oral, BID, Shafer, Devon, NP, 12.5 mg at 07/21/22 2154   rosuvastatin (CRESTOR) tablet 20 mg, 20 mg, Per Tube, Daily, de Yolanda Manges, Berwind E, NP, 20 mg at 07/21/22 0830   senna-docusate (Senokot-S) tablet 1 tablet, 1 tablet, Per Tube, BID, Wilson Singer I, RPH, 1 tablet at 07/21/22 2154   Patients Current Diet:  Diet Order                  Diet NPO time specified  Diet effective now                         Precautions / Restrictions Precautions Precautions: Fall Precaution Comments: waist restraint Restrictions Weight Bearing Restrictions: No    Has the patient had 2 or more falls or a fall with injury in the past year? Unknown and pt with abrasion to forehead on admission but denies falling.    Prior Activity Level Community (5-7x/wk): fully independent, driving, no DME used, likes to hike   Prior Functional Level Self Care: Did the patient need help bathing, dressing, using the toilet or eating? Independent   Indoor Mobility: Did the patient need assistance with walking from room to room (with or without device)? Independent   Stairs: Did the patient need assistance  with internal or external stairs (with or without device)? Independent   Functional Cognition: Did the patient need help planning regular tasks such as shopping or remembering to take medications? Independent   Patient Information Are you of Hispanic, Latino/a,or Spanish origin?: A. No, not of Hispanic, Latino/a, or Spanish origin; x: pt unable to respond (pts spouse answered) What is your race?: A. White; x: pt unable to respond (pt's spouse answered) Do you need or want an interpreter to communicate with a doctor or health care staff?: 0.  No   Patient's Response To:  Health Literacy and Transportation Is the patient able to respond to health literacy and transportation needs?: No (all answered by pts spouse) Health Literacy - How often do you need to have someone help you when you read instructions, pamphlets, or other written material from your doctor or pharmacy?: Patient unable to respond In the past 12 months, has lack of transportation kept you from medical appointments or from getting medications?: No In the past 12 months, has lack of transportation kept you from meetings, work, or from getting things needed for daily living?: No   Development worker, international aid / [place] Devices/Equipment: None Home Equipment: None   Prior Device Use: Indicate devices/aids used by the patient prior to current illness, exacerbation or injury? None of the above   Current Functional Level Cognition   Overall Cognitive Status: Impaired/Different from baseline Difficult to assess due to: Impaired communication Orientation Level: Disoriented to place, Disoriented to time, Disoriented to situation, Oriented to person General Comments: pt awake and alert qwith flat affect making minimal conversation and minimal verbalizations, pt following commands with increased time and repetition    Extremity Assessment (includes Sensation/Coordination)   Upper Extremity Assessment: Defer to OT evaluation         ADLs   Overall ADL's : Needs assistance/impaired Eating/Feeding: Total assistance, NPO Eating/Feeding Details (indicate cue type and reason): cortrak Grooming: Maximal assistance, Sitting Upper Body Bathing: Maximal assistance, Sitting Lower Body Bathing: Maximal assistance Lower Body Bathing Details (indicate cue type and reason): min A sit<>stand Upper Body Dressing : Maximal assistance, Sitting Lower Body Dressing: Maximal assistance Lower Body Dressing Details (indicate cue type and reason): min A sit<>stand Toilet Transfer: Minimal assistance, +2 for safety/equipment Toilet Transfer Details (indicate cue type and reason): bed>window>door>bed Toileting- Clothing Manipulation and Hygiene: Maximal assistance Toileting - Clothing Manipulation Details (indicate cue type and reason): min A  sit<>stand     Mobility   Overal bed mobility: Needs Assistance Bed Mobility: Supine to Sit, Sit to Supine Supine to sit: Min assist Sit to supine: Supervision General bed mobility comments: min assist to elevate trunk     Transfers   Overall transfer level: Needs assistance Equipment used: Rolling walker (2 wheels) Transfers: Sit to/from Stand Sit to Stand: Min assist General transfer comment: assist to rise and steady, lines/leads management     Ambulation / Gait / Stairs / Wheelchair Mobility   Ambulation/Gait Ambulation/Gait assistance: Herbalist (Feet): 96 Feet Assistive device: Rolling walker (2 wheels) Gait Pattern/deviations: Step-through pattern, Decreased stride length, Trunk flexed General Gait Details: assist to steady, no LOB, laps in room with pt trying to open door each time aproaching despite repeated cues and reminders to stay in room, hands on management of RW ofor directions Gait velocity: decr Gait velocity interpretation: <1.31 ft/sec, indicative of household ambulator     Posture / Balance Balance Overall balance assessment: Needs  assistance Sitting-balance support: No upper extremity supported, Feet supported Sitting balance-Leahy Scale: Fair Standing balance support: Single extremity supported Standing balance-Leahy Scale: Poor     Special needs/care consideration Diabetic management yes    Previous Home Environment (from acute therapy documentation) Living Arrangements: Spouse/significant other  Lives With: Spouse Available Help at Discharge: Family, Available 24 hours/day Type of Home: House Home Layout: Two level, 1/2 bath on main level, Bed/bath upstairs Alternate Level Stairs-Rails: Left Alternate Level Stairs-Number of Steps: flight Home Access: Stairs to enter Entrance Stairs-Rails: None Entrance Stairs-Number of Steps: 3  Bathroom Shower/Tub: Optometrist: Yes How Accessible: Accessible via walker Home Care Services: No Additional Comments: full bath on second floor   Discharge Living Setting Plans for Discharge Living Setting: Patient's home, Lives with (comment) (spouse) Type of Home at Discharge: House Discharge Home Layout: Two level, 1/2 bath on main level Alternate Level Stairs-Rails: Left Alternate Level Stairs-Number of Steps: full flight Discharge Home Access: Stairs to enter Entrance Stairs-Number of Steps: 3 Discharge Bathroom Shower/Tub: Tub/shower unit Discharge Bathroom Toilet: Standard Discharge Bathroom Accessibility: Yes How Accessible: Accessible via walker Does the patient have any problems obtaining your medications?: No   Social/Family/Support Systems Patient Roles: Spouse Anticipated Caregiver: spouse, Leonarda Salon Anticipated Caregiver's Contact Information: [phone] Ability/Limitations of Caregiver: n/a Caregiver Availability: 24/7 Discharge Plan Discussed with Primary Caregiver: Yes Is Caregiver In Agreement with Plan?: Yes Does Caregiver/Family have Issues with Lodging/Transportation while Pt is in  Rehab?: No   Goals Patient/Family Goal for Rehab: PT/OT/SLP supervision to mod I Expected length of stay: 12-14 days Additional Information: MBSS pending Pt/Family Agrees to Admission and willing to participate: Yes Program Orientation Provided & Reviewed with Pt/Caregiver Including Roles  & Responsibilities: Yes  Barriers to Discharge: Insurance for SNF coverage   Decrease burden of Care through IP rehab admission: n/a   Possible need for SNF placement upon discharge: Not anticipated.    Patient Condition: I have reviewed medical records from [facility], spoken with  [facility] team , and patient. I discussed via phone for inpatient rehabilitation assessment.  Patient will benefit from ongoing PT, OT, and SLP, can actively participate in 3 hours of therapy a day 5 days of the week, and can make measurable gains during the admission.  Patient will also benefit from the coordinated team approach during an Inpatient Acute Rehabilitation admission.  The patient will receive intensive therapy as well as Rehabilitation physician, nursing, social worker, and care management interventions.  Due to bladder management, bowel management, safety, skin/wound care, disease management, medication administration, pain management, and patient education the patient requires 24 hour a day rehabilitation nursing.  The patient is currently min to mod assist with mobility and basic ADLs.  Discharge setting and therapy post discharge at home with home health is anticipated.  Patient has agreed to participate in the Acute Inpatient Rehabilitation Program and will admit today.   Preadmission Screen Completed By:  Michel Santee, PT, DPT 07/22/2022 10:16 AM ______________________________________________________________________   Discussed status with Dr. Curlene Dolphin on 07/22/22 at 10:16 AM and received approval for admission today.   Admission Coordinator:  Michel Santee, PT, DPT time 10:16 AM/Date 07/22/22      Assessment/Plan: Diagnosis: bilateral basal ganglia infarctions  Does the need for close, 24 hr/day Medical supervision in concert with the patient's rehab needs make it unreasonable for this patient to be served in a less intensive setting? Yes Co-Morbidities requiring supervision/potential complications: COVID, Dysphagia, HLD Due to bladder management, bowel management, safety, skin/wound care, disease management, medication administration, pain management, and patient education, does the patient require 24 hr/day rehab nursing? Yes Does the patient require coordinated care of a physician, rehab nurse, PT, OT, and SLP to address physical and functional deficits in the context of the above medical diagnosis(es)? Yes Addressing deficits in the following areas: balance, endurance, locomotion, strength, transferring, bowel/bladder control, bathing, dressing, feeding, grooming, toileting, cognition, speech, language, swallowing, and psychosocial support Can the patient actively participate in an intensive therapy program of at least 3 hrs of therapy 5  days a week? Yes The potential for patient to make measurable gains while on inpatient rehab is excellent Anticipated functional outcomes upon discharge from inpatient rehab: modified independent and supervision PT, modified independent and supervision OT, modified independent and supervision SLP Estimated rehab length of stay to reach the above functional goals is: 12-14 days Anticipated discharge destination: Home 10. Overall Rehab/Functional Prognosis: excellent     MD Signature: Jennye Boroughs

## 2022-07-22 NOTE — Progress Notes (Signed)
Upon arrival of EP Cardiologist patient was difficult to arouse. He has recently received a dose of Seroquel which was started yesterday.  He will arouse with stimuli.  No new focal deficit noted.  Lips appeared discolored upon arrival.  After oral care, lips pink.  Per RN he had a swallow exam this am with dye in test material.  Dr Leonie Man came to bedside feels this is related to the recent dose of Seroquel.  133/87  HR 51  RR 21  O2 sat 94% on RA  Temp 98.6 CBG 121  RN to call if patient continues to be difficult to arouse.

## 2022-07-22 NOTE — Progress Notes (Addendum)
Inpatient Rehab Admissions Coordinator:    I have insurance approval and a bed available for pt to admit to CIR today. Janine Ores, NP, in agreement.  Will let pt/family and TOC team know.   Addendum: planning loop today and will need nutrition source (either diet or replacement of cortrak) for transition to CIR.  Team aware.    Shann Medal, PT, DPT Admissions Coordinator [phone] 07/22/22  10:07 AM

## 2022-07-22 NOTE — TOC Transition Note (Addendum)
Transition of Care [facility]) - CM/SW Discharge Note   Patient Details  Name: Anias Bartol MRN: [id number] Date of Birth: 04-Sep-1950  [facility] CM/SW Contact:  Verdell Carmine, RN Phone Number: 07/22/2022, 12:34 PM   Clinical Narrative:     Patient pulled out NG last night, speech re-consulted to assess swallow and he will be on honey thick diet. Messaged with CIR , provider nurse. CIR will accept him today.  No further intervention needed from [facility]  1630 no loop recorder today, will be placed on Zio tomorrow at rehab for monitoring.per Electrophysiology team.   Final next level of care: [place] Barriers to Discharge: No Barriers Identified   Patient Goals and CMS Choice          Discharge Placement             IP rehab ( CIR)          Discharge Plan and Services                                     Social Determinants of Health (SDOH) Interventions Housing Interventions: Intervention Not Indicated   Readmission Risk Interventions     No data to display

## 2022-07-22 NOTE — Progress Notes (Signed)
Inpatient Rehabilitation Admission Medication Review by a Pharmacist   A complete drug regimen review was completed for this patient to identify any potential clinically significant medication issues.   High Risk Drug Classes Is patient taking? Indication by Medication  Antipsychotic No   Anticoagulant No    Antibiotic No    Opioid No    Antiplatelet Yes ASA-stroke ppx  Hypoglycemics/insulin No    Vasoactive Medication No    Chemotherapy No    Other Yes Rosuvastatin-HLD Senokot/miralax- constipation Mupirocin-rash Famotidine-GERD        Type of Medication Issue Identified Description of Issue Recommendation(s)  Drug Interaction(s) (clinically significant)        Duplicate Therapy        Allergy        No Medication Administration End Date        Incorrect Dose        Additional Drug Therapy Needed        Significant med changes from prior encounter (inform family/care partners about these prior to discharge).  Seroquel  Restart PTA meds when and if necessary during CIR admission or at time of discharge, if warranted   Other            Clinically significant medication issues were identified that warrant physician communication and completion of prescribed/recommended actions by midnight of the next day:  No   Name of provider notified for urgent issues identified:    Provider Method of Notification:    Pharmacist comments:    Time spent performing this drug regimen review (minutes):  [place], PharmD. [facility] Acute Care PGY-1 07/22/2022 2:03 PM

## 2022-07-23 ENCOUNTER — Telehealth: Payer: Self-pay | Admitting: Physician Assistant

## 2022-07-23 ENCOUNTER — Inpatient Hospital Stay (HOSPITAL_BASED_OUTPATIENT_CLINIC_OR_DEPARTMENT_OTHER)
Admit: 2022-07-23 | Discharge: 2022-07-23 | Disposition: A | Discharge status: Home / Self Care | Payer: Medicare Other | Attending: Physician Assistant | Admitting: Physician Assistant

## 2022-07-23 DIAGNOSIS — I639 Cerebral infarction, unspecified: Secondary | ICD-10-CM

## 2022-07-23 DIAGNOSIS — I6381 Other cerebral infarction due to occlusion or stenosis of small artery: Secondary | ICD-10-CM | POA: Diagnosis not present

## 2022-07-23 LAB — COMPREHENSIVE METABOLIC PANEL
ALT: 17 U/L (ref 0–44)
AST: 22 U/L (ref 15–41)
Albumin: 2.7 g/dL — ABNORMAL LOW (ref 3.5–5.0)
Alkaline Phosphatase: 51 U/L (ref 38–126)
Anion gap: 10 (ref 5–15)
BUN: 16 mg/dL (ref 8–23)
CO2: 23 mmol/L (ref 22–32)
Calcium: 8.8 mg/dL — ABNORMAL LOW (ref 8.9–10.3)
Chloride: 106 mmol/L (ref 98–111)
Creatinine, Ser: 1.08 mg/dL (ref 0.61–1.24)
GFR, Estimated: >60 mL/min (ref >60)
Glucose, Bld: 118 mg/dL — ABNORMAL HIGH (ref 70–99)
Potassium: 3.9 mmol/L (ref 3.5–5.1)
Sodium: 139 mmol/L (ref 135–145)
Total Bilirubin: 0.9 mg/dL (ref 0.3–1.2)
Total Protein: 6.7 g/dL (ref 6.5–8.1)

## 2022-07-23 LAB — CBC WITH DIFFERENTIAL/PLATELET
Abs Immature Granulocytes: 0.02 10*3/uL (ref 0.00–0.07)
Basophils Absolute: 0 10*3/uL (ref 0.0–0.1)
Basophils Relative: 1 %
Eosinophils Absolute: 0.2 10*3/uL (ref 0.0–0.5)
Eosinophils Relative: 2 %
HCT: 40.2 % (ref 39.0–52.0)
Hemoglobin: 14 g/dL (ref 13.0–17.0)
Immature Granulocytes: 0 %
Lymphocytes Relative: 18 %
Lymphs Abs: 1.3 10*3/uL (ref 0.7–4.0)
MCH: 33.9 pg (ref 26.0–34.0)
MCHC: 34.8 g/dL (ref 30.0–36.0)
MCV: 97.3 fL (ref 80.0–100.0)
Monocytes Absolute: 0.9 10*3/uL (ref 0.1–1.0)
Monocytes Relative: 12 %
Neutro Abs: 5.1 10*3/uL (ref 1.7–7.7)
Neutrophils Relative %: 67 %
Platelets: 326 10*3/uL (ref 150–400)
RBC: 4.13 MIL/uL — ABNORMAL LOW (ref 4.22–5.81)
RDW: 12.2 % (ref 11.5–15.5)
WBC: 7.6 10*3/uL (ref 4.0–10.5)
nRBC: 0 % (ref 0.0–0.2)

## 2022-07-23 LAB — VITAMIN D 25 HYDROXY (VIT D DEFICIENCY, FRACTURES): Vit D, 25-Hydroxy: 44.29 ng/mL (ref 30–100)

## 2022-07-23 MED ORDER — LORAZEPAM 0.5 MG PO TABS: 0.5000 mg | ORAL_TABLET | Freq: Once | ORAL | Status: DC

## 2022-07-23 MED ORDER — QUETIAPINE FUMARATE 25 MG PO TABS
25.0000 mg | ORAL_TABLET | Freq: Three times a day (TID) | ORAL | Status: DC | PRN
  Administered 2022-07-23 – 2022-08-03 (×7): 25 mg via ORAL
  Filled 2022-07-23 (×10): qty 1

## 2022-07-23 MED ORDER — CALCIUM CARBONATE ANTACID 500 MG PO CHEW
1.0000 | CHEWABLE_TABLET | Freq: Every day | ORAL | Status: DC
  Administered 2022-07-23 – 2022-08-03 (×12): 200 mg via ORAL
  Filled 2022-07-23 (×12): qty 1

## 2022-07-23 MED ORDER — APIXABAN 5 MG PO TABS
5.0000 mg | ORAL_TABLET | Freq: Two times a day (BID) | ORAL | Status: DC
  Administered 2022-07-23 – 2022-08-03 (×22): 5 mg via ORAL
  Filled 2022-07-23 (×22): qty 1

## 2022-07-23 MED ORDER — APIXABAN 5 MG PO TABS: 5.0000 mg | ORAL_TABLET | Freq: Two times a day (BID) | ORAL | Status: DC

## 2022-07-23 MED ORDER — LORAZEPAM 2 MG/ML IJ SOLN: 0.5000 mg | Freq: Once | INTRAMUSCULAR | Status: DC

## 2022-07-23 NOTE — Evaluation (Signed)
Speech Language Pathology Assessment and Plan  Patient Details  Name: Douglas Cox MRN: [id number] Date of Birth: 1950-08-10  SLP Diagnosis: Cognitive Impairments;Speech and Language deficits;Dysphagia  Rehab Potential: Fair ELOS: 7-10 days   Today's Date: 07/23/2022 SLP Individual Time: 1100-1200 SLP Individual Time Calculation (min): 60 min  Hospital Problem: Principal Problem:   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 ([place])  Past Medical History:  Past Medical History:  Diagnosis Date   Celiac disease    Retinal detachment    Past Surgical History:  Past Surgical History:  Procedure Laterality Date   CATARACT EXTRACTION Bilateral    Dr. Valetta Close   EYE SURGERY     FRACTURE SURGERY     GAS INSERTION Right 05/21/2021   Procedure: INSERTION OF GAS - C3F8;  Surgeon: Bernarda Caffey, MD;  Location: [place];  Service: Ophthalmology;  Laterality: Right;   GAS/FLUID EXCHANGE Right 05/21/2021   Procedure: GAS/FLUID EXCHANGE;  Surgeon: Bernarda Caffey, MD;  Location: [place];  Service: Ophthalmology;  Laterality: Right;   HERNIA REPAIR     IR ANGIO INTRA EXTRACRAN SEL INTERNAL CAROTID UNI R MOD SED  07/17/2022   IR PERCUTANEOUS ART THROMBECTOMY/INFUSION INTRACRANIAL INC DIAG ANGIO  07/17/2022   IR US GUIDE VASC ACCESS RIGHT  07/17/2022   PARS PLANA VITRECTOMY Right 05/21/2021   Procedure: PARS PLANA VITRECTOMY WITH 25 GAUGE;  Surgeon: Bernarda Caffey, MD;  Location: [place];  Service: Ophthalmology;  Laterality: Right;   PERFLUORONE INJECTION Right 05/21/2021   Procedure: PERFLUORONE INJECTION;  Surgeon: Bernarda Caffey, MD;  Location: [place];  Service: Ophthalmology;  Laterality: Right;   PHOTOCOAGULATION WITH LASER Right 05/21/2021   Procedure: PHOTOCOAGULATION WITH LASER;  Surgeon: Bernarda Caffey, MD;  Location: [place];  Service: Ophthalmology;  Laterality: Right;   RADIOLOGY WITH ANESTHESIA N/A 07/17/2022   Procedure: IR WITH ANESTHESIA;  Surgeon: Radiologist, Medication, MD;   Location: [place];  Service: Radiology;  Laterality: N/A;   RETINAL DETACHMENT SURGERY      Assessment / Plan / Recommendation Clinical Impression Douglas Cox is a 71 year old right-handed male with history of celiac disease and retinal detachment, quit smoking 16 years ago.  Per chart review patient lives with spouse.  Two-level home half bath on main level with bed and bath upstairs.  Independent prior to admission and driving.  Presented 07/17/2022 with acute onset of left-sided weakness as well as right frontal headache.  Cranial CT scan showed loss of gray-white differentiation in the right insular cortex lateral aspect of right lentiform nucleus, and right temporal tip consistent with acute infarction.  No acute hemorrhage.  Status post TNK as well as recanalization per interventional radiology.  MRI follow-up showed acute/subacute nonhemorrhagic infarct of the right caudate head and lentiform nucleus as noted on previous CT.  Airborne/contact precautions for positive COVID.  Patient is initially n.p.o. with alternative means of nutritional support and diet advanced 07/22/2022 to mechanical soft honey thick liquid by spoon.  He pulled out his cortrak 12/21. Placed on Unasyn suspect possible pneumonia latest chest x-ray 07/20/2022 showed no convincing evidence of aspiration.  No suspicious infiltrates. Bouts of agitation and restlessness placed on Seroquel.  Cardiology unable to place loop recorder due sedation. Therapy evaluations completed due to patient decreased functional mobility left-sided weakness and dysphagia was admitted for a comprehensive rehab program.   SLP consulted to complete cognitive-linguistic evaluation and clinical swallow evaluation (CSE) in the setting of recent R basal ganglia CVA. Pt presents with severe cognitive-linguistic deficits with significant attention  deficits impacting all cognitive domains. Pt exhibited decreased focused attention, orientation (pt oriented to  person and place only), memory (immediate, short-term), problem solving, intellectual awareness, and safety awareness. Level of attention waxed and waned throughout session. Comprehension and expression were difficult to assess due to significance of attention deficits. Pt was inconsistent with following 2-step commands and responding to yes/no questions. Pt exhibited language of confusion throughout. There were many times pt would not respond at all. Object naming, verbal repetition, and following 1-step commands WFL. Speech intelligibility waxed and waned. Pt had bouts of unintelligible speech secondary to mumbling and significantly reduced vocal intensity, however there were many times pt's speech was clear, fluent, and appropriate vocal intensity. As per CSE, SLP continues to recommend a dysphagia 3 diet with honey thick liquids by spoon. Pt exhibited no overt s/sx of aspiration and had clear vocal quality post swallows among all tested consistencies. Pt exhibited occasional congested cough without presentation of PO intake but it should be noted pt still recovering from positive Covid-19 dx. Recommend skilled ST intervention to address cognitive-linguistic, speech, and swallow function to maximize functional independence at discharge. Anticipate pt will require 24 hour supervision at discharge and would benefit from additional SLP services in [facility] setting.    Skilled Therapeutic Interventions          CSE, and informal cognitive-linguistic, speech/language assessment measures administered. Please see report for full details.   SLP Assessment  Patient will need skilled Speech Lanaguage Pathology Services during CIR admission    Recommendations  SLP Diet Recommendations: Dysphagia 3 (Mech soft);Honey Liquid Administration via: Spoon Medication Administration: Whole meds with puree Supervision: Full supervision/cueing for compensatory strategies Compensations: Slow rate;Small sips/bites;Minimize  environmental distractions Postural Changes and/or Swallow Maneuvers: Seated upright 90 degrees Oral Care Recommendations: Oral care QID Patient destination: Home Follow up Recommendations: Home Health SLP;24 hour supervision/assistance Equipment Recommended: To be determined    SLP Frequency 3 to 5 out of 7 days   SLP Duration  SLP Intensity  SLP Treatment/Interventions 7-10 days  Minumum of 1-2 x/day, 30 to 90 minutes  Cognitive remediation/compensation;Dysphagia/aspiration precaution training;Environmental controls;Functional tasks;Internal/external aids;Patient/family education;Speech/Language facilitation;Therapeutic Activities    Pain Pain Assessment Pain Scale: 0-10 Pain Score: 0-No pain  Prior Functioning Cognitive/Linguistic Baseline: Information not available Type of Home: House  Lives With: Spouse Available Help at Discharge: Available 24 hours/day Vocation: Retired  SLP Evaluation Cognition Overall Cognitive Status: Impaired/Different from baseline Arousal/Alertness: Awake/alert Orientation Level: Oriented to person;Disoriented to situation;Disoriented to time;Oriented to place Year: 2023 Month: December Day of Week: Incorrect Attention: Focused Focused Attention: Impaired Focused Attention Impairment: Verbal basic;Functional basic Sustained Attention: Impaired Sustained Attention Impairment: Verbal basic;Functional basic Memory: Impaired Memory Impairment: Storage deficit;Retrieval deficit;Decreased recall of new information;Decreased short term memory Decreased Short Term Memory: Verbal basic Awareness: Impaired Awareness Impairment: Intellectual impairment Problem Solving: Impaired Problem Solving Impairment: Verbal basic;Functional basic Executive Function:  (all impaired) Safety/Judgment: Impaired  Comprehension Auditory Comprehension Overall Auditory Comprehension: Impaired Yes/No Questions: Impaired Basic Biographical Questions: 76-100%  accurate (80%) Basic Immediate Environment Questions: 75-100% accurate (75%) Complex Questions: 25-49% accurate (25%) Commands: Impaired One Step Basic Commands: 75-100% accurate (100%) Two Step Basic Commands: 75-100% accurate (75%) Multistep Basic Commands: Not tested Interfering Components: Attention;Processing speed;Working Field seismologist: English as a second language teacher time;Repetition Reading Comprehension Reading Status: Not tested Expression Expression Primary Mode of Expression: Verbal Verbal Expression Overall Verbal Expression: Impaired Automatic Speech: Name;Social Response;Counting (dow and mo impacted by attention) Level of Generative/Spontaneous Verbalization: Word;Phrase;Sentence Repetition: No impairment Naming: No impairment Pragmatics:  No impairment Interfering Components: Attention Written Expression Dominant Hand: Right Written Expression: Not tested Oral Motor Oral Motor/Sensory Function Overall Oral Motor/Sensory Function: Mild impairment Facial ROM: Within Functional Limits Facial Symmetry: Within Functional Limits Facial Strength: Within Functional Limits Facial Sensation: Within Functional Limits Lingual ROM: Within Functional Limits Lingual Symmetry: Within Functional Limits Lingual Strength: Reduced Velum: Within Functional Limits Mandible: Within Functional Limits Motor Speech Overall Motor Speech: Impaired Respiration: Within functional limits Phonation: Low vocal intensity Resonance: Within functional limits Articulation: Within functional limitis Intelligibility: Intelligibility reduced (due to reduced vocal intensity) Word: 75-100% accurate Phrase: 75-100% accurate Sentence: 75-100% accurate Conversation: Not tested Motor Planning: Witnin functional limits Motor Speech Errors: Not applicable Effective Techniques: Increased vocal intensity  Care Tool Care Tool Cognition Ability to hear (with hearing aid or hearing appliances if  normally used Ability to hear (with hearing aid or hearing appliances if normally used): 0. Adequate - no difficulty in normal conservation, social interaction, listening to TV   Expression of Ideas and Wants Expression of Ideas and Wants: 2. Frequent difficulty - frequently exhibits difficulty with expressing needs and ideas   Understanding Verbal and Non-Verbal Content Understanding Verbal and Non-Verbal Content: 2. Sometimes understands - understands only basic conversations or simple, direct phrases. Frequently requires cues to understand  Memory/Recall Ability Memory/Recall Ability : Current season   Intelligibility: Intelligibility reduced (due to reduced vocal intensity) Word: 75-100% accurate Phrase: 75-100% accurate Sentence: 75-100% accurate Conversation: Not tested  Bedside Swallowing Assessment General Date of Onset: 07/17/22 Previous Swallow Assessment: FEES 12/18 and 12/21 Diet Prior to this Study: Dysphagia 3 (soft);Honey-thick liquids (HTL by spoon) Temperature Spikes Noted: No Respiratory Status: Room air History of Recent Intubation: Yes Length of Intubations (days):  (<24 hours) Date extubated: 07/18/22 Behavior/Cognition: Confused;Distractible;Requires cueing Oral Cavity - Dentition: Adequate natural dentition Self-Feeding Abilities: Needs set up;Able to feed self Patient Positioning: Upright in bed Baseline Vocal Quality: Low vocal intensity Volitional Cough: Strong Volitional Swallow: Unable to elicit  Oral Care Assessment Oral Assessment  (WDL): Exceptions to WDL Lips: Symmetrical Teeth: Intact Tongue: Pink;Moist Mucous Membrane(s): Moist;Pink Saliva: Moist, saliva free flowing Level of Consciousness: Alert Is patient on any of following O2 devices?: None of the above Nutritional status: On thickened liquids Oral Assessment Risk : High Risk Ice Chips Ice chips: Not tested Thin Liquid Thin Liquid: Not tested Nectar Thick Nectar Thick Liquid: Not  tested Honey Thick Honey Thick Liquid: Impaired Pharyngeal Phase Impairments: Cough - Delayed Puree Puree: Within functional limits Presentation: Spoon Solid Solid: Impaired Oral Phase Functional Implications: Oral residue BSE Assessment Risk for Aspiration Impact on safety and function: Moderate aspiration risk Other Related Risk Factors: Cognitive impairment  Short Term Goals: Week 1: SLP Short Term Goal 1 (Week 1): STG=LTG due to ELOS  Refer to Care Plan for Long Term Goals  Recommendations for other services: Therapeutic Recreation  Pet therapy  Discharge Criteria: Patient will be discharged from SLP if patient refuses treatment 3 consecutive times without medical reason, if treatment goals not met, if there is a change in medical status, if patient makes no progress towards goals or if patient is discharged from hospital.  The above assessment, treatment plan, treatment alternatives and goals were discussed and mutually agreed upon: by patient  Patty Sermons 07/23/2022, 3:55 PM

## 2022-07-23 NOTE — Progress Notes (Signed)
Inpatient Rehabilitation Care Coordinator Assessment and Plan Patient Details  Name: Douglas Cox MRN: [id number] Date of Birth: October 31, 1950  Today's Date: 07/23/2022  Hospital Problems: Principal Problem:   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 ([place])  Past Medical History:  Past Medical History:  Diagnosis Date   Celiac disease    Retinal detachment    Past Surgical History:  Past Surgical History:  Procedure Laterality Date   CATARACT EXTRACTION Bilateral    Dr. Valetta Close   EYE SURGERY     FRACTURE SURGERY     GAS INSERTION Right 05/21/2021   Procedure: INSERTION OF GAS - C3F8;  Surgeon: Bernarda Caffey, MD;  Location: [place];  Service: Ophthalmology;  Laterality: Right;   GAS/FLUID EXCHANGE Right 05/21/2021   Procedure: GAS/FLUID EXCHANGE;  Surgeon: Bernarda Caffey, MD;  Location: [place];  Service: Ophthalmology;  Laterality: Right;   HERNIA REPAIR     IR ANGIO INTRA EXTRACRAN SEL INTERNAL CAROTID UNI R MOD SED  07/17/2022   IR PERCUTANEOUS ART THROMBECTOMY/INFUSION INTRACRANIAL INC DIAG ANGIO  07/17/2022   IR US GUIDE VASC ACCESS RIGHT  07/17/2022   PARS PLANA VITRECTOMY Right 05/21/2021   Procedure: PARS PLANA VITRECTOMY WITH 25 GAUGE;  Surgeon: Bernarda Caffey, MD;  Location: [place];  Service: Ophthalmology;  Laterality: Right;   PERFLUORONE INJECTION Right 05/21/2021   Procedure: PERFLUORONE INJECTION;  Surgeon: Bernarda Caffey, MD;  Location: [place];  Service: Ophthalmology;  Laterality: Right;   PHOTOCOAGULATION WITH LASER Right 05/21/2021   Procedure: PHOTOCOAGULATION WITH LASER;  Surgeon: Bernarda Caffey, MD;  Location: [place];  Service: Ophthalmology;  Laterality: Right;   RADIOLOGY WITH ANESTHESIA N/A 07/17/2022   Procedure: IR WITH ANESTHESIA;  Surgeon: Radiologist, Medication, MD;  Location: [place];  Service: Radiology;  Laterality: N/A;   RETINAL DETACHMENT SURGERY     Social History:  reports that he quit smoking about 16 years ago. His smoking use included  cigarettes. He has never used smokeless tobacco. He reports current alcohol use of about 4.0 - 5.0 standard drinks of alcohol per week. He reports current drug use. Frequency: 7.00 times per week. Drug: Marijuana.  Family / Support Systems Marital Status: Married How Long?: 22 years Patient Roles: Spouse Spouse/Significant Other: [name] (wife) [phone] Children: no children Other Supports: none reported Anticipated Caregiver: Wife Ability/Limitations of Caregiver: Wife currenlty works full time and is workingon paid cargiver leave so she can work remote. She is working towards intermittent work hours where she works for 4 hrs and avaialble for his care needs. Caregiver Availability: 24/7 Family Dynamics: Pt lives with his wife and their two cats.  Social History Preferred language: English Religion: None Cultural Background: Pt is retired. He worked as an Publishing rights manager in plays, wrote scripts for plays, does photogrpahy, carpentry, and musician (plays guitar and writes music). Education: high school grad Health Literacy - How often do you need to have someone help you when you read instructions, pamphlets, or other written material from your doctor or pharmacy?: Never Writes: Yes Employment Status: Retired Public relations account executive Issues: Denies Guardian/Conservator: N/A   Abuse/Neglect Abuse/Neglect Assessment Can Be Completed: Unable to assess, patient is non-responsive or altered mental status  Patient response to: Social Isolation - How often do you feel lonely or isolated from those around you?: Patient unable to respond  Emotional Status Pt's affect, behavior and adjustment status: Pt unable to communicate with SW. Will intermittently answer questions, however,has poor attention. Recent Psychosocial Issues: N/A Psychiatric History: Pt wife reports in  past he has dealt with depression and taken medication. Substance Abuse History: She reports he quit smoking  cigarettes in 1998 when they met. Reports a night cap 2 drinks of watered down whiskey - 4oz glasses. Wife is aware he smokes marijuana daily but unsure on amount.  Patient / Family Perceptions, Expectations & Goals Pt/Family understanding of illness & functional limitations: Pt wife has a general undertstanding of his care needs Premorbid pt/family roles/activities: Independent Anticipated changes in roles/activities/participation: Assistance with ADLs.IADLs Pt/family expectations/goals: HIs wife's gola is for him to "make as much progress as he can ...would love for him to be able to walk up and downstairs. improve his cognitive abilities since he is usually working on complex tasks."  [employer]: None Premorbid Home Care/DME Agencies: None Transportation available at discharge: Wife Is the patient able to respond to transportation needs?: Yes In the past 12 months, has lack of transportation kept you from medical appointments or from getting medications?: No In the past 12 months, has lack of transportation kept you from meetings, work, or from getting things needed for daily living?: No Resource referrals recommended: Neuropsychology  Discharge Planning Living Arrangements: Spouse/significant other Support Systems: Spouse/significant other Type of Residence: Private residence Administrator, sports: Multimedia programmer (specify) (BCBS Medicare) Financial Resources: Radio broadcast assistant Screen Referred: No Living Expenses: Medical laboratory scientific officer Management: Patient Does the patient have any problems obtaining your medications?: No Home Management: Pt wife reports they both manage home care needs, and he primarily does all the cooking and yardwork since he is retired. Patient/Family Preliminary Plans: TBD Care Coordinator Barriers to Discharge: Decreased caregiver support, Lack of/limited family support, Insurance for SNF coverage Care Coordinator Anticipated  Follow Up Needs: HH/OP  Clinical Impression  34- SW made contact with pt wife to complete assessment,a dn discuss discharge plan. Pt wife is primary caregiver. 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 HCPOA- his wife [name]. She would like for him to be able to walk up/down stairs since their main bathroom is upstairs, half bath on first floor, and three STE home.   Virgil Lightner A Sly Parlee 07/23/2022, 3:20 PM

## 2022-07-23 NOTE — Progress Notes (Signed)
PROGRESS NOTE   Subjective/Complaints: No new complaints this morning As per nursing has been impulsive and up to use bathroom without help, telesitter ordered Labs stable   Objective:   No results found. Recent Labs    07/23/22 0521  WBC 7.6  HGB 14.0  HCT 40.2  PLT 326   Recent Labs    07/23/22 0521  NA 139  K 3.9  CL 106  CO2 23  GLUCOSE 118*  BUN 16  CREATININE 1.08  CALCIUM 8.8*    Intake/Output Summary (Last 24 hours) at 07/23/2022 1140 Last data filed at 07/22/2022 1800 Gross per 24 hour  Intake 50 ml  Output --  Net 50 ml        Physical Exam: Vital Signs Blood pressure 129/83, pulse 96, temperature 99 F (37.2 C), resp. rate 18, SpO2 94 %. Gen: no distress, normal appearing HEENT: oral mucosa pink and moist, NCAT Cardio: Reg rate Chest: normal effort, normal rate of breathing Abd: soft, non-distended Ext: no edema Psych: pleasant, normal affect Skin: intact Neuro: Eyes open, alert and awake, able to state his name and hospital, follows simple commands difficulty with more complex commands, minimal verbal output.  Able to name 1 of 3 items.  Was not able to repeat.  PERRLA.  EOMI.  Able to turn head in both directions.  Sensation intact light touch in all 4 extremities Difficulty with MMT however however appears to be generally 4-/5 in right upper extremity, 3-4/5 in left upper extremity, 4 - in right lower extremity,2-3 out of 5 in left lower extremity Musculoskeletal: No joint swelling or tenderness noted, no abnormal tone noted, Normal muscle bulk   Assessment/Plan: 1. Functional deficits which require 3+ hours per day of interdisciplinary therapy in a comprehensive inpatient rehab setting. Physiatrist is providing close team supervision and 24 hour management of active medical problems listed below. Physiatrist and rehab team continue to assess barriers to discharge/monitor patient  progress toward functional and medical goals  Care Tool:  Bathing              Bathing assist       Upper Body Dressing/Undressing Upper body dressing        Upper body assist      Lower Body Dressing/Undressing Lower body dressing            Lower body assist       Toileting Toileting    Toileting assist       Transfers Chair/bed transfer  Transfers assist           Locomotion Ambulation   Ambulation assist              Walk 10 feet activity   Assist           Walk 50 feet activity   Assist           Walk 150 feet activity   Assist           Walk 10 feet on uneven surface  activity   Assist           Wheelchair     Assist  Wheelchair 50 feet with 2 turns activity    Assist            Wheelchair 150 feet activity     Assist          Blood pressure 129/83, pulse 96, temperature 99 F (37.2 C), resp. rate 18, SpO2 94 %.  Medical Problem List and Plan: 1. 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  Plan for loop recorder             -patient may shower             -ELOS/Goals: 12-14 days             -Admit to CIR PT/OT/SLP             -Needs wearable heart rhythm monitor per cardiology 2.  Antithrombotics: -DVT/anticoagulation:  Mechanical: Antiembolism stockings, thigh (TED hose) Bilateral lower extremities             -antiplatelet therapy: Aspirin 81 mg daily 3. Pain Management: Tylenol as needed 4. Impulsivity: telesitter ordered.              -antipsychotic agents: Seroquel 12.5 mg twice daily 5. Neuropsych/cognition: This patient is not capable of making decisions on his own behalf. 6. Skin/Wound Care: Routine skin checks 7. Fluids/Electrolytes/Nutrition: Routine in and outs with follow-up chemistries 8.  Dysphagia.  Diet advanced to  mechanical soft honey thick liquids by spoon.  Awaiting plan to continue nasogastric tube for nutritional support and hydration.  Follow-up speech therapy             -On IVF for 12 hours. Assess need for continuation of IVF 9.  COVID-positive.  Airborne/contact precautions.  Patient is on airborne precautions to 12/22 and stop 10.  Hyperlipidemia.  Crestor 69m 11. DM 2? HGB A1C 5.7. GBGs have not been elevated. 12. HTN. Not on any home medication. BP goal less than 180 with Long term goal normotensive  13. Hypocalcemia: 1 tablet tums started daily 14. Screening for vitamin D deficiency: add vitamin D level today.     LOS: 1 days A FACE TO FACE EVALUATION WAS PERFORMED  KMartha ClanP Brittain Smithey 07/23/2022, 11:40 AM

## 2022-07-23 NOTE — Progress Notes (Signed)
Patient ID: Braeson Rupe, male   DOB: [birth year], 71 y.o.   MRN: [id number]  SW went by pt room in efforts to complete assessment. Pt not able to answer SW questions. He has poor attention, and was fixated on trying to open gait belt package.   [id number] left message for pt wife Leonarda Salon [phone]) introducing self, and requested follow-up to discuss discharge plan.    Loralee Pacas, MSW, [place] Office: [phone] Cell: [phone] Fax: [phone]

## 2022-07-23 NOTE — Care Management Important Message (Signed)
Important Message  Patient Details  Name: Douglas Cox MRN: [id number] Date of Birth: 09/05/1950   Medicare Important Message Given:  Yes     Daven [address] 07/23/2022, 9:40 AM

## 2022-07-23 NOTE — Discharge Instructions (Addendum)
Inpatient Rehab Discharge Instructions  Douglas Cox Discharge date and time: No discharge date for patient encounter.   Activities/Precautions/ Functional Status: Activity: activity as tolerated Diet: Soft/honey liquids Wound Care: Routine skin checks Functional status:  ___ No restrictions     ___ Walk up steps independently ___ 24/7 supervision/assistance   ___ Walk up steps with assistance ___ Intermittent supervision/assistance  ___ Bathe/dress independently ___ Walk with walker     _x__ Bathe/dress with assistance ___ Walk Independently    ___ Shower independently ___ Walk with assistance    ___ Shower with assistance ___ No alcohol     ___ Return to work/school ________  Special Instructions:    No driving smoking or alcohol  COMMUNITY REFERRALS UPON DISCHARGE:    Outpatient: PT  OT  SP             Agency: [facility] [address] [place] [address] [phone]              Appointment Date/Time:WILL CALL WIFE TO SET UP FOLLOW UP APPOINTMENTS  Medical Equipment/Items Ordered:NO NEEDS                                                 Agency/Supplier:NA     STROKE/TIA DISCHARGE INSTRUCTIONS SMOKING Cigarette smoking nearly doubles your risk of having a stroke & is the single most alterable risk factor  If you smoke or have smoked in the last 12 months, you are advised to quit smoking for your health. Most of the excess cardiovascular risk related to smoking disappears within a year of stopping. Ask you doctor about anti-smoking medications [facility] Quit Line: 1-800-QUIT NOW Free Smoking Cessation Classes [phone]  CHOLESTEROL Know your levels; limit fat & cholesterol in your diet  Lipid Panel     Component Value Date/Time   CHOL 176 07/18/2022 0335   TRIG 241 (H) 07/18/2022 0335   TRIG 246 (H) 07/18/2022 0335   HDL 44 07/18/2022 0335   CHOLHDL 4.0 07/18/2022 0335   VLDL 48 (H) 07/18/2022 0335   LDLCALC 84  07/18/2022 0335     Many patients benefit from treatment even if their cholesterol is at goal. Goal: Total Cholesterol (CHOL) less than 160 Goal:  Triglycerides (TRIG) less than 150 Goal:  HDL greater than 40 Goal:  LDL (LDLCALC) less than 100   BLOOD PRESSURE American Stroke Association blood pressure target is less that 120/80 mm/Hg  Your discharge blood pressure is:  BP: 129/83 Monitor your blood pressure Limit your salt and alcohol intake Many individuals will require more than one medication for high blood pressure  DIABETES (A1c is a blood sugar average for last 3 months) Goal HGBA1c is under 7% (HBGA1c is blood sugar average for last 3 months)  Diabetes: No known diagnosis of diabetes    Lab Results  Component Value Date   HGBA1C 5.7 (H) 07/18/2022    Your HGBA1c can be lowered with medications, healthy diet, and exercise. Check your blood sugar as directed by your physician Call your physician if you experience unexplained or low blood sugars.  PHYSICAL ACTIVITY/REHABILITATION Goal is 30 minutes at least 4 days per week  Activity: Increase activity slowly, Therapies: Physical Therapy: Home Health Return to work:  Activity decreases your risk of heart attack and stroke and makes your heart stronger.  It helps control your weight and blood pressure; helps you relax and can improve your mood. Participate in a regular exercise program. Talk with your doctor about the best form of exercise for you (dancing, walking, swimming, cycling).  DIET/WEIGHT Goal is to maintain a healthy weight  Your discharge diet is:  Diet Order             DIET DYS 3 Room service appropriate? Yes; Fluid consistency: Honey Thick  Diet effective now                   liquids Your height is:    Your current weight is:   Your Body Mass Index (BMI) is:    Following the type of diet specifically designed for you will help prevent another stroke. Your goal weight range is:   Your goal Body Mass  Index (BMI) is 19-24. Healthy food habits can help reduce 3 risk factors for stroke:  High cholesterol, hypertension, and excess weight.  RESOURCES Stroke/Support Group:  Call [phone]   STROKE EDUCATION PROVIDED/REVIEWED AND GIVEN TO PATIENT Stroke warning signs and symptoms How to activate emergency medical system (call 911). Medications prescribed at discharge. Need for follow-up after discharge. Personal risk factors for stroke. Pneumonia vaccine given: No Flu vaccine given: No My questions have been answered, the writing is legible, and I understand these instructions.  I will adhere to these goals & educational materials that have been provided to me after my discharge from the hospital.     My questions have been answered and I understand these instructions. I will adhere to these goals and the provided educational materials after my discharge from the hospital.  Patient/Caregiver Signature _______________________________ Date __________  Clinician Signature _______________________________________ Date __________  Please bring this form and your medication list with you to all your follow-up doctor's appointments.       Information on my medicine - ELIQUIS (apixaban) This medication education was reviewed with me or my healthcare representative as part of my discharge preparation.    Why was Eliquis prescribed for you? Eliquis was prescribed for you to reduce the risk of a blood clot forming that can cause a stroke if you have a medical condition called atrial fibrillation (a type of irregular heartbeat).  What do You need to know about Eliquis ? Take your Eliquis TWICE DAILY - one tablet in the morning and one tablet in the evening with or without food. If you have difficulty swallowing the tablet whole please discuss with your pharmacist how to take the medication safely.  Take Eliquis exactly as prescribed by your doctor and DO NOT stop taking Eliquis without  talking to the doctor who prescribed the medication.  Stopping may increase your risk of developing a stroke.  Refill your prescription before you run out.  After discharge, you should have regular check-up appointments with your healthcare provider that is prescribing your Eliquis.  In the future your dose may need to be changed if your kidney function or weight changes by a significant amount or as you get older.  What do you do if you miss a dose? If you miss a dose, take it as soon as you remember on the same day and resume taking twice daily.  Do not take more than one dose of ELIQUIS at the same time to make up a missed dose.  Important Safety Information A possible side effect of Eliquis is bleeding. You should call your healthcare provider right away if you  experience any of the following: Bleeding from an injury or your nose that does not stop. Unusual colored urine (red or dark brown) or unusual colored stools (red or black). Unusual bruising for unknown reasons. A serious fall or if you hit your head (even if there is no bleeding).  Some medicines may interact with Eliquis and might increase your risk of bleeding or clotting while on Eliquis. To help avoid this, consult your healthcare provider or pharmacist prior to using any new prescription or non-prescription medications, including herbals, vitamins, non-steroidal anti-inflammatory drugs (NSAIDs) and supplements.  This website has more information on Eliquis (apixaban): http://www.eliquis.com/eliquis/home

## 2022-07-23 NOTE — Progress Notes (Signed)
Inpatient Rehabilitation  Patient information reviewed and entered into eRehab system by Brinlyn Cena Amias Hutchinson, OTR/L, Rehab Quality Coordinator.   Information including medical coding, functional ability and quality indicators will be reviewed and updated through discharge.   

## 2022-07-23 NOTE — Plan of Care (Signed)
  Problem: RH Swallowing Goal: LTG Patient will consume least restrictive diet using compensatory strategies with assistance (SLP) Description: LTG:  Patient will consume least restrictive diet using compensatory strategies with assistance (SLP) Flowsheets (Taken 07/23/2022 1558) LTG: Pt Patient will consume least restrictive diet using compensatory strategies with assistance of (SLP): Minimal Assistance - Patient > 75% Goal: LTG Patient will participate in dysphagia therapy to increase swallow function with assistance (SLP) Description: LTG:  Patient will participate in dysphagia therapy to increase swallow function with assistance (SLP) Flowsheets (Taken 07/23/2022 1558) LTG: Pt will participate in dysphagia therapy to increase swallow function with assistance of (SLP): Minimal Assistance - Patient > 75% Goal: LTG Pt will demonstrate functional change in swallow as evidenced by bedside/clinical objective assessment (SLP) Description: LTG: Patient will demonstrate functional change in swallow as evidenced by bedside/clinical objective assessment (SLP) Flowsheets (Taken 07/23/2022 1558) LTG: Patient will demonstrate functional change in swallow as evidenced by bedside/clinical objective assessment: Oropharyngeal swallow   Problem: RH Cognition - SLP Goal: RH LTG Patient will demonstrate orientation with cues Description:  LTG:  Patient will demonstrate orientation to person/place/time/situation with cues (SLP)   Flowsheets (Taken 07/23/2022 1558) LTG Patient will demonstrate orientation to:  Person  Place  Time  Situation LTG: Patient will demonstrate orientation using cueing (SLP): Moderate Assistance - Patient 50 - 74%   Problem: RH Expression Communication Goal: LTG Patient will increase speech intelligibility (SLP) Description: LTG: Patient will increase speech intelligibility at word/phrase/conversation level with cues, % of the time (SLP) Flowsheets (Taken 07/23/2022 1558) LTG:  Patient will increase speech intelligibility (SLP): Minimal Assistance - Patient > 75% Level: Phrase Percent of time patient will use intelligible speech: 75%   Problem: RH Problem Solving Goal: LTG Patient will demonstrate problem solving for (SLP) Description: LTG:  Patient will demonstrate problem solving for basic/complex daily situations with cues  (SLP) Flowsheets (Taken 07/23/2022 1558) LTG: Patient will demonstrate problem solving for (SLP): Basic daily situations LTG Patient will demonstrate problem solving for: Moderate Assistance - Patient 50 - 74%   Problem: RH Attention Goal: LTG Patient will demonstrate this level of attention during functional activites (SLP) Description: LTG:  Patient will will demonstrate this level of attention during functional activites (SLP) Flowsheets (Taken 07/23/2022 1558) Patient will demonstrate during cognitive/linguistic activities the attention type of: Sustained Patient will demonstrate this level of attention during cognitive/linguistic activities in: Controlled LTG: Patient will demonstrate this level of attention during cognitive/linguistic activities with assistance of (SLP): Moderate Assistance - Patient 50 - 74% Number of minutes patient will demonstrate attention during cognitive/linguistic activities: 5   Problem: RH Awareness Goal: LTG: Patient will demonstrate awareness during functional activites type of (SLP) Description: LTG: Patient will demonstrate awareness during functional activites type of (SLP) Flowsheets (Taken 07/23/2022 1558) Patient will demonstrate during cognitive/linguistic activities awareness type of: Intellectual LTG: Patient will demonstrate awareness during cognitive/linguistic activities with assistance of (SLP): Moderate Assistance - Patient 50 - 74%

## 2022-07-23 NOTE — Progress Notes (Signed)
Spoke with the patient and his wife bedside. Zio monitor is ordered and will be placed today They are aware.  EP follow up will be arranged.  Tommye Standard, PA-C

## 2022-07-23 NOTE — Progress Notes (Addendum)
AFib has been detected by his monitor HR 80's-90's I have made attending and Dr. Leonie Man aware. Placed tracing in patient's chart/file.  Tommye Standard, PA-C

## 2022-07-23 NOTE — Evaluation (Signed)
Occupational Therapy Assessment and Plan  Patient Details  Name: Douglas Cox MRN: [id number] Date of Birth: November 06, 1950  OT Diagnosis: ataxia, cognitive deficits, and muscle weakness (generalized) Rehab Potential: Rehab Potential (ACUTE ONLY): Good ELOS: 7-10 days   Today's Date: 07/23/2022 OT Individual Time: 0905-1000 OT Individual Time Calculation (min): 55 min     Hospital Problem: Principal Problem:   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 ([place])   Past Medical History:  Past Medical History:  Diagnosis Date   Celiac disease    Retinal detachment    Past Surgical History:  Past Surgical History:  Procedure Laterality Date   CATARACT EXTRACTION Bilateral    Dr. Valetta Close   EYE SURGERY     FRACTURE SURGERY     GAS INSERTION Right 05/21/2021   Procedure: INSERTION OF GAS - C3F8;  Surgeon: Bernarda Caffey, MD;  Location: [place];  Service: Ophthalmology;  Laterality: Right;   GAS/FLUID EXCHANGE Right 05/21/2021   Procedure: GAS/FLUID EXCHANGE;  Surgeon: Bernarda Caffey, MD;  Location: [place];  Service: Ophthalmology;  Laterality: Right;   HERNIA REPAIR     IR ANGIO INTRA EXTRACRAN SEL INTERNAL CAROTID UNI R MOD SED  07/17/2022   IR PERCUTANEOUS ART THROMBECTOMY/INFUSION INTRACRANIAL INC DIAG ANGIO  07/17/2022   IR US GUIDE VASC ACCESS RIGHT  07/17/2022   PARS PLANA VITRECTOMY Right 05/21/2021   Procedure: PARS PLANA VITRECTOMY WITH 25 GAUGE;  Surgeon: Bernarda Caffey, MD;  Location: [place];  Service: Ophthalmology;  Laterality: Right;   PERFLUORONE INJECTION Right 05/21/2021   Procedure: PERFLUORONE INJECTION;  Surgeon: Bernarda Caffey, MD;  Location: [place];  Service: Ophthalmology;  Laterality: Right;   PHOTOCOAGULATION WITH LASER Right 05/21/2021   Procedure: PHOTOCOAGULATION WITH LASER;  Surgeon: Bernarda Caffey, MD;  Location: [place];  Service: Ophthalmology;  Laterality: Right;   RADIOLOGY WITH ANESTHESIA N/A 07/17/2022   Procedure: IR WITH ANESTHESIA;  Surgeon:  Radiologist, Medication, MD;  Location: [place];  Service: Radiology;  Laterality: N/A;   RETINAL DETACHMENT SURGERY      Assessment & Plan Clinical Impression: Douglas Cox is a 71 year old right-handed male with history of celiac disease and retinal detachment, quit smoking 16 years ago.  Per chart review patient lives with spouse.  Two-level home half bath on main level with bed and bath upstairs.  Independent prior to admission and driving.  Presented 07/17/2022 with acute onset of left-sided weakness as well as right frontal headache.  Cranial CT scan showed loss of gray-white differentiation in the right insular cortex lateral aspect of right lentiform nucleus, and right temporal tip consistent with acute infarction.  No acute hemorrhage.  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  Status post TNK as well as recanalization per interventional radiology.  MRI follow-up showed acute/subacute nonhemorrhagic infarct of the right caudate head and lentiform nucleus as noted on previous CT.  Echocardiogram with ejection fraction of 60 to 65%.  Admission chemistries unremarkable except glucose 110, hemoglobin A1c 5.7, SARS coronavirus positive.  Currently maintained on aspirin for CVA prophylaxis.  Airborne/contact precautions for positive COVID.  Patient is initially n.p.o. with alternative means of nutritional support and diet advanced 07/22/2022 to mechanical soft honey thick liquid by spoon.  He pulled out his cortrak 12/21.  Placed on Unasyn suspect possible pneumonia latest chest x-ray 07/20/2022 showed no convincing evidence of aspiration.  No suspicious infiltrates.  Bouts of agitation and restlessness placed on Seroquel.  Cardiology unable to place loop recorder due sedation. Plan by cardiology for wearable heart rhythm monitor tomorrow with f/u in EP clinic as outpatients.   Therapy evaluations completed due to patient  decreased functional mobility left-sided weakness and dysphagia was admitted for a comprehensive rehab program. Patient transferred to CIR on 07/22/2022 .    Patient currently requires min with basic self-care skills secondary to muscle weakness, ataxia and decreased coordination, decreased initiation, decreased attention, decreased awareness, decreased problem solving, decreased safety awareness, decreased memory, and delayed processing, and decreased sitting balance, decreased standing balance, decreased postural control, and decreased balance strategies.  Prior to hospitalization, patient could complete ADLs with independent .  Patient will benefit from skilled intervention to increase independence with basic self-care skills prior to discharge home with care partner.  Anticipate patient will require 24 hour supervision and follow up home health.  OT - End of Session Activity Tolerance: Tolerates 10 - 20 min activity with multiple rests Endurance Deficit: Yes OT Assessment Rehab Potential (ACUTE ONLY): Good OT Patient demonstrates impairments in the following area(s): Balance;Endurance;Motor;Cognition;Behavior;Safety OT Basic ADL's Functional Problem(s): Bathing;Dressing;Toileting OT Transfers Functional Problem(s): Toilet;Tub/Shower OT Additional Impairment(s): None OT Plan OT Intensity: Minimum of 1-2 x/day, 45 to 90 minutes OT Frequency: 5 out of 7 days OT Duration/Estimated Length of Stay: 7-10 days OT Treatment/Interventions: Balance/vestibular training;Discharge planning;Pain management;Self Care/advanced ADL retraining;Therapeutic Activities;UE/LE Coordination activities;Therapeutic Exercise;Patient/family education;Functional mobility training;Cognitive remediation/compensation;Community reintegration;DME/adaptive equipment instruction;UE/LE Strength taining/ROM;Visual/perceptual remediation/compensation;Psychosocial support OT Self Feeding Anticipated Outcome(s): no goal OT Basic  Self-Care Anticipated Outcome(s): (S) OT Toileting Anticipated Outcome(s): (S) OT Bathroom Transfers Anticipated Outcome(s): (S) OT Recommendation Patient destination: Home Follow Up Recommendations: Outpatient OT Equipment Recommended: To be determined   OT Evaluation Precautions/Restrictions  Precautions Precautions: Fall  Pain Pain Assessment Pain Scale: 0-10 Pain Score: 0-No pain Home Living/Prior Functioning Home Living Available Help at Discharge: Available 24 hours/day Type of Home: House Home Access: Stairs to enter [employer] of Steps: 3 Entrance Stairs-Rails: None Home Layout: Two level, 1/2 bath on main level, Bed/bath upstairs Alternate Level Stairs-Number of Steps: flight Bathroom Shower/Tub: Chiropodist: Standard Bathroom Accessibility: Yes Additional Comments: full bath on second floor  Lives With: Spouse IADL History Homemaking Responsibilities: Yes Meal Prep Responsibility: Primary Laundry Responsibility: Secondary Cleaning Responsibility: Secondary Bill Paying/Finance Responsibility: Primary Shopping Responsibility: Primary Current License: Yes Occupation: Retired Type of Occupation: Architect working, Geophysicist/field seismologist, Veterinary surgeon, Pension scheme manager Prior Function Level of Independence: Independent with basic ADLs, Independent with transfers, Independent with gait Driving: Yes Vocation: Retired Surveyor, mining Baseline Vision/History: 1 Wears glasses Ability to See in Adequate Light: 0 Adequate Patient Visual Report: No change from baseline Vision Assessment?: Vision impaired- to be further tested in functional context Perception  Perception: Within Functional Limits Praxis Praxis: Impaired Praxis Impairment Details: Perseveration Cognition Cognition Overall Cognitive Status: Impaired/Different from baseline Arousal/Alertness: Awake/alert Orientation Level: Person;Place;Situation Person: Oriented Place: Disoriented Situation:  Disoriented Memory: Impaired Memory Impairment: Storage deficit;Retrieval deficit;Decreased recall of new information;Decreased short term memory Decreased Short Term Memory: Verbal basic Attention: Focused Focused Attention: Impaired Focused Attention Impairment: Verbal basic;Functional basic Sustained Attention: Impaired Sustained Attention Impairment: Verbal basic;Functional basic Awareness: Impaired Awareness Impairment: Intellectual impairment Problem Solving: Impaired Problem Solving Impairment: Verbal basic;Functional basic Executive Function:  (all impaired) Safety/Judgment: Impaired Brief Interview for Mental Status (BIMS) Repetition of Three Words (First Attempt): 3 Temporal Orientation: Year: Missed by more than 5 years Temporal Orientation: Month: Accurate within 5 days Temporal Orientation: Day: Incorrect Recall: "Sock": Yes, no cue required Recall: "Blue":  Yes, no cue required Recall: "Bed": Yes, after cueing ("a piece of furniture") BIMS Summary Score: 10 Sensation Sensation Light Touch: Appears Intact Hot/Cold: Appears Intact Coordination Gross Motor Movements are Fluid and Coordinated: No Fine Motor Movements are Fluid and Coordinated: No Coordination and Movement Description: Slightly ataxic but no real hemi Finger Nose Finger Test: Slightly ataxic Motor  Motor Motor: Ataxia  Trunk/Postural Assessment  Cervical Assessment Cervical Assessment: Within Functional Limits Thoracic Assessment Thoracic Assessment: Within Functional Limits Lumbar Assessment Lumbar Assessment: Within Functional Limits Postural Control Postural Control: Deficits on evaluation Righting Reactions: delayed  Balance Balance Balance Assessed: Yes Static Sitting Balance Static Sitting - Balance Support: Feet supported Static Sitting - Level of Assistance: 6: Modified independent (Device/Increase time) Dynamic Sitting Balance Dynamic Sitting - Balance Support: Feet  supported Dynamic Sitting - Level of Assistance: 6: Modified independent (Device/Increase time) Static Standing Balance Static Standing - Balance Support: During functional activity Static Standing - Level of Assistance: 5: Stand by assistance Dynamic Standing Balance Dynamic Standing - Balance Support: During functional activity Dynamic Standing - Level of Assistance: 4: Min assist Extremity/Trunk Assessment RUE Assessment RUE Assessment: Exceptions to [facility] General Strength Comments: ataxic with generalized weakness- 3/5 LUE Assessment LUE Assessment: Exceptions to [facility] General Strength Comments: 3/5  Care Tool Care Tool Self Care Eating   Eating Assist Level: Supervision/Verbal cueing    Oral Care    Oral Care Assist Level: Supervision/Verbal cueing    Bathing   Body parts bathed by patient: Right arm;Left arm;Chest;Abdomen;Front perineal area;Right upper leg;Buttocks;Left upper leg;Right lower leg;Left lower leg;Face     Assist Level: Minimal Assistance - Patient > 75%    Upper Body Dressing(including orthotics)   What is the patient wearing?: Pull over shirt   Assist Level: Contact Guard/Touching assist    Lower Body Dressing (excluding footwear)   What is the patient wearing?: Pants Assist for lower body dressing: Contact Guard/Touching assist    Putting on/Taking off footwear   What is the patient wearing?: Non-skid slipper socks Assist for footwear: Supervision/Verbal cueing       Care Tool Toileting Toileting activity   Assist for toileting: Minimal Assistance - Patient > 75%     Care Tool Bed Mobility Roll left and right activity   Roll left and right assist level: Supervision/Verbal cueing    Sit to lying activity   Sit to lying assist level: Supervision/Verbal cueing    Lying to sitting on side of bed activity   Lying to sitting on side of bed assist level: the ability to move from lying on the back to sitting on the side of the bed with no back  support.: Supervision/Verbal cueing     Care Tool Transfers Sit to stand transfer   Sit to stand assist level: Minimal Assistance - Patient > 75%    Chair/bed transfer   Chair/bed transfer assist level: Minimal Assistance - Patient > 75%     Toilet transfer   Assist Level: Minimal Assistance - Patient > 75%     Care Tool Cognition  Expression of Ideas and Wants Expression of Ideas and Wants: 2. Frequent difficulty - frequently exhibits difficulty with expressing needs and ideas  Understanding Verbal and Non-Verbal Content Understanding Verbal and Non-Verbal Content: 2. Sometimes understands - understands only basic conversations or simple, direct phrases. Frequently requires cues to understand   Memory/Recall Ability Memory/Recall Ability : Current season   Refer to Care Plan for [place] 1 OT  Short Term Goal 1 (Week 1): STG= LTG d/t ELOS  Recommendations for other services: Neuropsych   Skilled Therapeutic Intervention ADL ADL Eating: Supervision/safety Where Assessed-Eating: Bed level Grooming: Supervision/safety Where Assessed-Grooming: Edge of bed Upper Body Bathing: Supervision/safety Where Assessed-Upper Body Bathing: Shower Lower Body Bathing: Contact guard Where Assessed-Lower Body Bathing: Shower Upper Body Dressing: Supervision/safety Where Assessed-Upper Body Dressing: Sitting at sink Lower Body Dressing: Minimal assistance Where Assessed-Lower Body Dressing: Sitting at sink Toileting: Minimal assistance Where Assessed-Toileting: Glass blower/designer: Psychiatric nurse Method: Counselling psychologist: Energy manager: Minimal Hydrologist Method: Heritage manager: Civil engineer, contracting with back Mobility  Bed Mobility Bed Mobility: Sit to Supine;Supine to Sit Supine to Sit: Contact Guard/Touching assist Sit to Supine: Contact  Guard/Touching assist Transfers Sit to Stand: Minimal Assistance - Patient > 75% Stand to Sit: Minimal Assistance - Patient > 75%  Skilled OT evaluation completed with the creation of pt centered OT POC. Pt educated on condition, ELOS, rehab expectations, and fall risk reduction strategies throughout session however anticipate poor learning at this time. Pt with inconsistent cognitive deficits that waxed/waned during session. He would become lethargic when sitting/laying and was difficult to awaken (all VSS) and then would re-open his eyes and have full participative alertness. He completed ADLs at shower level with CGA overall. He demonstrated perseveration on random objects in room and was impulsive with mobility. No significant hemiplegia found but ataxia/weak in BUE. Extensive discussion with his wife over the phone re PLOF and current cognitive/physical level of function. Question behavioral piece of cognition. He impulsively transferred to supine and was un-arousable. Pt did have moments of tachycardia (130-140 bpm) that had inconsistent readings from dynamap and was audibly wheezing after mobility but this resolved. 15 min missed. Pt left supine with all needs met. PA aware of fluctuating arousal.     Discharge Criteria: Patient will be discharged from OT if patient refuses treatment 3 consecutive times without medical reason, if treatment goals not met, if there is a change in medical status, if patient makes no progress towards goals or if patient is discharged from hospital.  The above assessment, treatment plan, treatment alternatives and goals were discussed and mutually agreed upon: by patient and by family  Curtis Sites 07/23/2022, 12:15 PM

## 2022-07-23 NOTE — Telephone Encounter (Signed)
   Cardiac Monitor Alert  Date of alert:  07/23/2022   Patient Name: Douglas Cox  DOB: 1950/10/03  MRN: [id number]   [place] Cardiologist: None  [facility] EP:  None    Monitor Information: Long Term Monitor-Live Telemetry [ZioAT]  Reason:  CVA Ordering provider:  Tommye Standard   Alert Atrial Fibrillation/Flutter This is the 1st alert for this rhythm.  The patient has no hx of Atrial Fibrillation/Flutter.  The patient is not currently on anticoagulation.  Next Cardiology Appointment { Date:  09/10/22  Provider:  12:15   The patient could NOT be reached by telephone today.  Patient is currently on inpatient rehab floor at [facility].  Other: Patient admitted to [facility] for CVA on 07/17/22. He was transferred to [facility] on 07/22/22. From Epic, it appears Zio monitor was placed today at 76:73 pm. Elmo Putt from [facility] calling at 2:10 regarding 90 seconds of afib at 12:57 pm, 1st documentation of afib. Duration was 90 seconds, full length of strip. Elmo Putt states she left a voice mail message for patient. Alerted DOD Dr. Curt Bears as well ordering provider Tommye Standard. Faxed tracing to [employer].   Joni Reining, RN  07/23/2022 2:49 PM

## 2022-07-23 NOTE — Progress Notes (Signed)
ANTICOAGULATION CONSULT NOTE - Initial Consult  Pharmacy Consult for apixaban Indication: atrial fibrillation  Allergies  Allergen Reactions   Nitrous Oxide Other (See Comments)    Can cause blindness in right eye    Codone [Hydrocodone] Hives   Tylenol [Acetaminophen] Hives   Clarithromycin Anxiety   Gluten Meal Other (See Comments)    Stomach issues   Lactose Intolerance (Gi) Other (See Comments)    GI issues    Patient Measurements:    Vital Signs: Temp: 98.4 F (36.9 C) (12/22 1502) Temp Source: Oral (12/22 1502) BP: 126/86 (12/22 1502) Pulse Rate: 94 (12/22 1502)  Labs: Recent Labs    07/23/22 0521  HGB 14.0  HCT 40.2  PLT 326  CREATININE 1.08    Estimated Creatinine Clearance: 62.4 mL/min (by C-G formula based on SCr of 1.08 mg/dL).   Medical History: Past Medical History:  Diagnosis Date   Celiac disease    Retinal detachment     Medications:  Medications Prior to Admission  Medication Sig Dispense Refill Last Dose   aspirin EC 81 MG tablet Take 1 tablet (81 mg total) by mouth daily. Swallow whole. 30 tablet 12    famotidine (PEPCID) 20 MG tablet Place 1 tablet (20 mg total) into feeding tube 2 (two) times daily.      QUEtiapine (SEROQUEL) 25 MG tablet Take 0.5 tablets (12.5 mg total) by mouth 2 (two) times daily.      QUEtiapine (SEROQUEL) 25 MG tablet Take 0.5 tablets (12.5 mg total) by mouth at bedtime.      rosuvastatin (CRESTOR) 20 MG tablet Place 1 tablet (20 mg total) into feeding tube daily.      sodium chloride 0.9 % infusion Inject 1,000 mLs into the vein continuous.  0     Assessment: Douglas Cox is a 71 year old who was admitted with Bilateral basal ganglia infarcts s/p TNK and mechanical thrombectomy of terminal right ICA occlusion with successful complete recanalization on 12/16.  Zio monitor was placed 12/22. AFib has been detected by his monitor. Pharmacy consulted to start anticoagulation. Confirmed with Neuro/Dr. Leonie Man, okay  to start anticoagulation. Patient able to take whole meds with puree.   Goal of Therapy:  Monitor platelets by anticoagulation protocol: Yes   Plan:  Apixaban 5 mg PO BID Monitor for bleeding   Thank you for allowing [place] to participate in this patients care. Jens Som, PharmD 07/23/2022 3:31 PM  **Pharmacist phone directory can be found on [email or web address] listed under [place]**

## 2022-07-23 NOTE — Telephone Encounter (Signed)
Caller is reporting abnormal readings for this patient.

## 2022-07-23 NOTE — Progress Notes (Addendum)
SLP Brief Note  Patient Details Name: Douglas Cox MRN: [id number] DOB: 19-Mar-1951   Cancelled treatment:        Spoke with wife [name] by phone to provide education regarding repeat FEES on 12/21.  Shared results and recommendations.  Moving forward IPR therapists will be handling plan of care.     Celedonio Savage, [place], [place] Office: [phone] 07/23/2022, 8:49 AM

## 2022-07-23 NOTE — Plan of Care (Signed)
  Problem: RH BOWEL ELIMINATION Goal: RH STG MANAGE BOWEL WITH ASSISTANCE Description: STG Manage Bowel with mod Assistance. Outcome: Not Progressing; LBM 12/19   Problem: RH BLADDER ELIMINATION Goal: RH STG MANAGE BLADDER WITH ASSISTANCE Description: STG Manage Bladder With mod Assistance Outcome: Not Progressing; incontinence

## 2022-07-23 NOTE — Evaluation (Signed)
Physical Therapy Assessment and Plan  Patient Details  Name: Douglas Cox MRN: [id number] Date of Birth: June 22, 1951  PT Diagnosis: Ataxia, Hemiparesis non-dominant, and Impaired cognition Rehab Potential: Good ELOS: 7-10 days   Today's Date: 07/23/2022 PT Individual Time: 1300-1415 PT Individual Time Calculation (min): 75 min    Hospital Problem: Principal Problem:   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 ([place])   Past Medical History:  Past Medical History:  Diagnosis Date   Celiac disease    Retinal detachment    Past Surgical History:  Past Surgical History:  Procedure Laterality Date   CATARACT EXTRACTION Bilateral    Dr. Valetta Close   EYE SURGERY     FRACTURE SURGERY     GAS INSERTION Right 05/21/2021   Procedure: INSERTION OF GAS - C3F8;  Surgeon: Bernarda Caffey, MD;  Location: [facility];  Service: Ophthalmology;  Laterality: Right;   GAS/FLUID EXCHANGE Right 05/21/2021   Procedure: GAS/FLUID EXCHANGE;  Surgeon: Bernarda Caffey, MD;  Location: [facility];  Service: Ophthalmology;  Laterality: Right;   HERNIA REPAIR     IR ANGIO INTRA EXTRACRAN SEL INTERNAL CAROTID UNI R MOD SED  07/17/2022   IR PERCUTANEOUS ART THROMBECTOMY/INFUSION INTRACRANIAL INC DIAG ANGIO  07/17/2022   IR US GUIDE VASC ACCESS RIGHT  07/17/2022   PARS PLANA VITRECTOMY Right 05/21/2021   Procedure: PARS PLANA VITRECTOMY WITH 25 GAUGE;  Surgeon: Bernarda Caffey, MD;  Location: [facility];  Service: Ophthalmology;  Laterality: Right;   PERFLUORONE INJECTION Right 05/21/2021   Procedure: PERFLUORONE INJECTION;  Surgeon: Bernarda Caffey, MD;  Location: [facility];  Service: Ophthalmology;  Laterality: Right;   PHOTOCOAGULATION WITH LASER Right 05/21/2021   Procedure: PHOTOCOAGULATION WITH LASER;  Surgeon: Bernarda Caffey, MD;  Location: [facility];  Service: Ophthalmology;  Laterality: Right;   RADIOLOGY WITH ANESTHESIA N/A 07/17/2022   Procedure: IR WITH ANESTHESIA;  Surgeon: Radiologist, Medication, MD;  Location: [facility];  Service: Radiology;  Laterality: N/A;   RETINAL DETACHMENT SURGERY      Assessment & Plan Clinical Impression: Douglas Cox is a 71 year old right-handed male with history of celiac disease and retinal detachment, quit smoking 16 years ago.  Per chart review patient lives with spouse.  Two-level home half bath on main level with bed and bath upstairs.  Independent prior to admission and driving.  Presented 07/17/2022 with acute onset of left-sided weakness as well as right frontal headache.  Cranial CT scan showed loss of gray-white differentiation in the right insular cortex lateral aspect of right lentiform nucleus, and right temporal tip consistent with acute infarction.  No acute hemorrhage.  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  Status post TNK as well as recanalization per interventional radiology.  MRI follow-up showed acute/subacute nonhemorrhagic infarct of the right caudate head and lentiform nucleus as noted on previous CT.  Echocardiogram with ejection fraction of 60 to 65%.  Admission chemistries unremarkable except glucose 110, hemoglobin A1c 5.7, SARS coronavirus positive.  Currently maintained on aspirin for CVA prophylaxis.  Airborne/contact precautions for positive COVID.  Patient is initially n.p.o. with alternative means of nutritional support and diet advanced 07/22/2022 to mechanical soft honey thick liquid by spoon.  He pulled out his cortrak 12/21.  Placed on Unasyn suspect possible pneumonia latest chest x-ray 07/20/2022 showed no convincing evidence of aspiration.  No suspicious infiltrates.  Bouts of agitation and restlessness placed on Seroquel.  Cardiology unable to place loop recorder  due sedation. Plan by cardiology for wearable heart rhythm monitor tomorrow with f/u in EP clinic as outpatients.   Therapy evaluations completed due to patient decreased functional mobility left-sided  weakness and dysphagia was admitted for a comprehensive rehab program    Patient currently requires min with mobility secondary to muscle weakness and ataxia and decreased coordination.  Prior to hospitalization, patient was independent  with mobility and lived with Spouse in a House home.  Home access is 5 Stairs to enter.  Patient will benefit from skilled PT intervention to maximize safe functional mobility, minimize fall risk, and decrease caregiver burden for planned discharge home with 24 hour supervision.  Anticipate patient will benefit from follow up OP at discharge.  PT - End of Session Activity Tolerance: Tolerates 30+ min activity with multiple rests Endurance Deficit: Yes PT Assessment Rehab Potential (ACUTE/IP ONLY): Good PT Barriers to Discharge: Inaccessible home environment PT Barriers to Discharge Comments: Bed and bath on 2nd floor. PT Patient demonstrates impairments in the following area(s): Balance;Behavior;Endurance;Motor;Safety PT Transfers Functional Problem(s): Bed Mobility;Bed to Chair;Car;Furniture;Floor PT Locomotion Functional Problem(s): Ambulation;Stairs;Wheelchair Mobility PT Plan PT Intensity: Minimum of 1-2 x/day ,45 to 90 minutes PT Frequency: 5 out of 7 days PT Duration Estimated Length of Stay: 7-10 days PT Treatment/Interventions: Ambulation/gait training;Discharge planning;Functional mobility training;Therapeutic Activities;Balance/vestibular training;Neuromuscular re-education;Wheelchair propulsion/positioning;Therapeutic Exercise;UE/LE Strength taining/ROM;Community reintegration;Patient/family education;Stair training;UE/LE Coordination activities PT Transfers Anticipated Outcome(s): Mod I PT Locomotion Anticipated Outcome(s): Mod I PT Recommendation Follow Up Recommendations: Outpatient PT Patient destination: Home Equipment Recommended: To be determined Equipment Details: probably none for PT.   PT  Evaluation Precautions/Restrictions Precautions Precautions: Fall Restrictions Weight Bearing Restrictions: No General Chart Reviewed: Yes Family/Caregiver Present: No Vital SignsTherapy Vitals Temp: 98.4 F (36.9 C) Temp Source: Oral Pulse Rate: 94 Resp: 17 BP: 126/86 Patient Position (if appropriate): Lying Oxygen Therapy SpO2: 95 % O2 Device: Room Air Pain Pain Assessment Pain Scale: 0-10 Pain Type: Acute pain Pain Location: Back Pain Orientation: Left Pain Onset: On-going Pain Intervention(s): Repositioned PAINAD (Pain Assessment in Advanced Dementia) Breathing: normal Negative Vocalization: none Facial Expression: facial grimacing Body Language: relaxed Consolability: no need to console PAINAD Score: 2 Pain Interference Pain Interference Pain Effect on Sleep: 2. Occasionally Pain Interference with Therapy Activities: 1. Rarely or not at all Pain Interference with Day-to-Day Activities: 1. Rarely or not at all Home Living/Prior [place]: Spouse/significant other Available Help at Discharge: Available 24 hours/day Type of Home: House Home Access: Stairs to enter [employer] of Steps: 5 Entrance Stairs-Rails: None Home Layout: Two level;1/2 bath on main level;Bed/bath upstairs Alternate Level Stairs-Number of Steps: 12-16  Lives With: Spouse Prior Function Level of Independence: Independent with gait;Independent with transfers;Other (comment) (driving)  Able to Take Stairs?: Yes Driving: Yes Vision/Perception     Cognition Overall Cognitive Status: Impaired/Different from baseline Arousal/Alertness: Awake/alert Orientation Level: Oriented to person Month: December Day of Week: Incorrect Focused Attention: Impaired Sustained Attention: Impaired Safety/Judgment: Impaired Sensation Sensation Light Touch: Appears Intact Coordination Gross Motor Movements are Fluid and Coordinated: No Fine Motor Movements  are Fluid and Coordinated: No Motor  Motor Motor: Ataxia   Trunk/Postural Assessment  Cervical Assessment Cervical Assessment: Within Functional Limits Thoracic Assessment Thoracic Assessment: Within Functional Limits Lumbar Assessment Lumbar Assessment: Within Functional Limits Postural Control Postural Control: Deficits on evaluation Righting Reactions: delayed requiring manual A for LOB.  Balance Balance Balance Assessed: Yes Standardized Balance Assessment Standardized Balance Assessment: Berg Balance Test Berg Balance Test Sit to Stand: Able to  stand  independently using hands Standing Unsupported: Able to stand 2 minutes with supervision Sitting with Back Unsupported but Feet Supported on Floor or Stool: Able to sit safely and securely 2 minutes Stand to Sit: Controls descent by using hands Transfers: Able to transfer safely, definite need of hands Standing Unsupported with Eyes Closed: Unable to keep eyes closed 3 seconds but stays steady Standing Ubsupported with Feet Together: Able to place feet together independently but unable to hold for 30 seconds From Standing, Reach Forward with Outstretched Arm: Can reach forward >12 cm safely (5") From Standing Position, Pick up Object from Floor: Able to pick up shoe, needs supervision From Standing Position, Turn to Look Behind Over each Shoulder: Looks behind one side only/other side shows less weight shift Turn 360 Degrees: Able to turn 360 degrees safely but slowly Standing Unsupported, Alternately Place Feet on Step/Stool: Able to complete >2 steps/needs minimal assist Standing Unsupported, One Foot in Front: Needs help to step but can hold 15 seconds Standing on One Leg: Able to lift leg independently and hold equal to or more than 3 seconds Total Score: 34 Static Sitting Balance Static Sitting - Balance Support: Feet supported Static Sitting - Level of Assistance: 6: Modified independent (Device/Increase time) Dynamic  Sitting Balance Dynamic Sitting - Balance Support: Feet supported Dynamic Sitting - Level of Assistance: 6: Modified independent (Device/Increase time) Static Standing Balance Static Standing - Level of Assistance: 5: Stand by assistance Dynamic Standing Balance Dynamic Standing - Level of Assistance: 4: Min assist Extremity Assessment      RLE Assessment RLE Assessment: Within Functional Limits General Strength Comments: grossly WFL, not following directions for MMT. LLE Assessment LLE Assessment: Within Functional Limits General Strength Comments: grossly WFL, not following directions for MMT.  Care Tool Care Tool Bed Mobility Roll left and right activity   Roll left and right assist level: Supervision/Verbal cueing    Sit to lying activity   Sit to lying assist level: Supervision/Verbal cueing    Lying to sitting on side of bed activity   Lying to sitting on side of bed assist level: the ability to move from lying on the back to sitting on the side of the bed with no back support.: Supervision/Verbal cueing     Care Tool Transfers Sit to stand transfer   Sit to stand assist level: Minimal Assistance - Patient > 75%    Chair/bed transfer   Chair/bed transfer assist level: Minimal Assistance - Patient > 75%     Physiological scientist transfer assist level: Minimal Assistance - Patient > 75%      Care Tool Locomotion Ambulation   Assist level: Minimal Assistance - Patient > 75% Assistive device: No Device Max distance: 50  Walk 10 feet activity   Assist level: Minimal Assistance - Patient > 75% Assistive device: No Device   Walk 50 feet with 2 turns activity   Assist level: Minimal Assistance - Patient > 75% Assistive device: No Device  Walk 150 feet activity Walk 150 feet activity did not occur: Safety/medical concerns   Assistive device: No Device  Walk 10 feet on uneven surfaces activity   Assist level: Minimal Assistance - Patient >  75% Assistive device: Other (comment) (no device)  Stairs   Assist level: Minimal Assistance - Patient > 75% Stairs assistive device: No device Max number of stairs: 4  Walk up/down 1 step activity   Walk up/down 1 step (  curb) assist level: Minimal Assistance - Patient > 75% Walk up/down 1 step or curb assistive device: No device  Walk up/down 4 steps activity   Walk up/down 4 steps assist level: Minimal Assistance - Patient > 75% Walk up/down 4 steps assistive device: No device  Walk up/down 12 steps activity Walk up/down 12 steps activity did not occur: Safety/medical concerns      Pick up small objects from floor   Pick up small object from the floor assist level: Minimal Assistance - Patient > 75% Pick up small object from the floor assistive device: no AD  Wheelchair Is the patient using a wheelchair?: Yes Type of Wheelchair: Manual   Wheelchair assist level: Minimal Assistance - Patient > 75% Max wheelchair distance: 20  Wheel 50 feet with 2 turns activity   Assist Level: Moderate Assistance - Patient 50 - 74%  Wheel 150 feet activity   Assist Level: Total Assistance - Patient < 25%    Refer to Care Plan for Long Term Goals  SHORT TERM GOAL WEEK 1 PT Short Term Goal 1 (Week 1): STG = LTG 2/2 ELOS  Recommendations for other services: None   Skilled Therapeutic Intervention Evaluation completed (see details above and below) with education on PT POC and goals and individual treatment initiated with focus on  balance, safety, endurance, stairs, gait.  Pt presents semi-reclined in bed reading a book.  Pt is agreeable to perform all that is asked, but very low volume.  Pt performs all bed mobility w/ supervision to CGA.  Pt amb in room w/o AD and min A basically for safety 2/2 impulsive behavior.  Pt amb only about 50' back and forth before sitting spontaneously.  Pt attempts opening door each trip around room but obeys directions about remaining in room.  Pt amb into BR over  ramped surface w/ CGA and stood to use toilet, managing clothing w/ CGA.  Nursing to chart continence.  Pt performed BERG balance test w/ score of 34/56 reflecting risk of falls.  Unsure if true interpretation or result of cognitive deficits of pt.  Pt performed step up and down 6" platform x 4 w/ min A required and again, impulsive performance before receiving instructions from PT.  Pt returned to bed w/ supervision and bed alarm placed, all 4 rails up and instructions to ask for assist before getting OOB.      Mobility Bed Mobility Bed Mobility: Sit to Supine;Supine to Sit Supine to Sit: Contact Guard/Touching assist;Supervision/Verbal cueing Sit to Supine: Contact Guard/Touching assist;Supervision/Verbal cueing Transfers Transfers: Sit to Stand;Stand to Sit;Stand Pivot Transfers Sit to Stand: Minimal Assistance - Patient > 75% Stand to Sit: Minimal Assistance - Patient > 75% Stand Pivot Transfers: Minimal Assistance - Patient > 75% Stand Pivot Transfer Details: Verbal cues for precautions/safety Stand Pivot Transfer Details (indicate cue type and reason): no device, for safety 2/2 impulsiveness. Locomotion  Gait Ambulation: Yes Gait Assistance: Minimal Assistance - Patient > 75% Gait Distance (Feet): 50 Feet Assistive device: None Gait Assistance Details: Verbal cues for precautions/safety Gait Gait: Yes Gait Pattern: Ataxic Gait velocity: decr Stairs / Additional Locomotion Ramp: Minimal Assistance - Patient >75% Curb: Minimal Assistance - Patient >75% Wheelchair Mobility Wheelchair Mobility: Yes Wheelchair Assistance: Minimal assistance - Patient >75% Wheelchair Propulsion: Both upper extremities Wheelchair Parts Management: Needs assistance Distance: 20' in room only.   Discharge Criteria: Patient will be discharged from PT if patient refuses treatment 3 consecutive times without medical reason, if treatment goals not met,  if there is a change in medical status, if  patient makes no progress towards goals or if patient is discharged from hospital.  The above assessment, treatment plan, treatment alternatives and goals were discussed and mutually agreed upon: by patient  Ladoris Gene 07/23/2022, 4:07 PM

## 2022-07-24 DIAGNOSIS — I6381 Other cerebral infarction due to occlusion or stenosis of small artery: Secondary | ICD-10-CM | POA: Diagnosis not present

## 2022-07-24 MED ORDER — CYANOCOBALAMIN 1000 MCG/ML IJ SOLN
1000.0000 ug | Freq: Once | INTRAMUSCULAR | Status: AC
  Administered 2022-07-24: 1000 ug via INTRAMUSCULAR
  Filled 2022-07-24: qty 1

## 2022-07-24 MED ORDER — DOCUSATE SODIUM 100 MG PO CAPS
100.0000 mg | ORAL_CAPSULE | Freq: Three times a day (TID) | ORAL | Status: DC
  Administered 2022-07-24 – 2022-07-31 (×20): 100 mg via ORAL
  Filled 2022-07-24 (×21): qty 1

## 2022-07-24 MED ORDER — VITAMIN D 25 MCG (1000 UNIT) PO TABS
1000.0000 [IU] | ORAL_TABLET | Freq: Every day | ORAL | Status: DC
  Administered 2022-07-24 – 2022-08-03 (×11): 1000 [IU] via ORAL
  Filled 2022-07-24 (×11): qty 1

## 2022-07-24 NOTE — Telephone Encounter (Signed)
Paged by Theodore Demark [phone]) regarding afib with RVR on 12/22, [facility] is unable to confirm is the patient is currently still in afib with RVR or not. It was a auto triggered event. It appears Renee my colleague was already aware of the afib yesterday, patient has been placed on Eliquis. I checked attending to obtain EKG today to make sure he is not in afib with RVR. As long as rate is under good control (resting HR 50-100), we can see as outpatient.

## 2022-07-24 NOTE — Progress Notes (Signed)
Speech Language Pathology Daily Session Note  Patient Details  Name: Douglas Cox MRN: [id number] Date of Birth: 1950/10/01  Today's Date: 07/24/2022 SLP Individual Time: 4446-1901 SLP Individual Time Calculation (min): 42 min  Short Term Goals: Week 1: SLP Short Term Goal 1 (Week 1): STG=LTG due to ELOS  Skilled Therapeutic Interventions: Skilled ST treatment focused on cognitive-linguistic goals. Pt was greeted in enclosure bed on arrival with spouse at bedside. Nurse relayed to therapy team spouse's request to see if she could be signed off to walk pt around room, hallway, and to provide supervision during meals if appropriate. PT/OT confirmed it would be appropriate for pt to ambulate with spouse as long as pt has close supervision and wearing gait belt at all times. SLP relayed information to spouse and reiterated safety considerations. SLP discussed with spouse the need for strict, 24 hour supervision at discharge due to impulsiveness and decreased insight. SLP also educated on swallowing needs, reinforced current diet, and educated on safe swallowing precautions (HTL by spoon only, upright positioning during meals, small bites/sips). Spouse verbalized understanding through teach back. It is appropriate for spouse to supervise during meals. SLP informed nurse of updates. Pt with fluctuating vocal intensity as seen yesterday. Spouse supports this observation since onset of CVA. Pt with minimal initiation for communication with SLP or spouse unless directly prompted. Pt named objects in halls and from [place] with 100% accuracy given min A verbal cues including sentence completion and/or semantic cue. Pt followed 1-step commands with min A verbal cues and extended processing time. Pt read at the word and sentence level with 100% accuracy using signs and quotes in the hallway. Pt responded to yes/no questions pertaining to personal interests and biographical information with ~80% accuracy given  extended processing time and verbal repetition. Pt exhibited flat affect and easily distracted by external stimuli, however improved from yesterday. Patient was left sitting EOB with spouse at bedside at end of session. Immediate needs met. Continue per current plan of care.      Pain  None/denied  Therapy/Group: Individual Therapy  Patty Sermons 07/24/2022, 5:59 PM

## 2022-07-24 NOTE — Progress Notes (Signed)
Physical Therapy Session Note  Patient Details  Name: Douglas Cox MRN: [id number] Date of Birth: September 25, 1950  Today's Date: 07/24/2022 PT Individual Time: 0802-0855 PT Individual Time Calculation (min): 53 min   Short Term Goals: Week 1:  PT Short Term Goal 1 (Week 1): STG = LTG 2/2 ELOS  Skilled Therapeutic Interventions/Progress Updates:     Pt received supine in posey bed asleep. Pt is very lethargic initially and slow to wake up. PT provides multiple cues engage pt and pt still slow to arouse. Pt's eyes are open but he only gives mumbled responses that PT cannot understand. PT attempts using music to engage pt, putting the Beatles on. Pt appears to enjoy music, opening eyes more and seen mouthing words to song and air-drumming. Eventually pt responds to verbal and tactile cues to facilitate supine to sit (with minA). Sit to stand with CGA. PT cues pt to ambulate to bathroom but pt shakes head "no". Pt ambulates around room without clear direction. PT able to cue pt to brush teeth at sink in standing with close supervision. Pt then continues to ambulate around room, requiring CGA to minA at times due to decreased safety awareness and LOBs slightly posterior and to the R. Pt is difficult to direct, appearing to be internally and externally distracting, fidgeting with objects in room and ambulating multiple loops around room. Pt noted to be singing along to [employer]. PT able to direct pt back to bed after several attempts. Pt left semi reclined in posey bed with TV on and call bell in reach.  Therapy Documentation Precautions:  Precautions Precautions: Fall Restrictions Weight Bearing Restrictions: No   Therapy/Group: Individual Therapy  Breck Coons, PT, DPT 07/24/2022, 4:08 PM

## 2022-07-24 NOTE — Progress Notes (Signed)
PROGRESS NOTE   Subjective/Complaints: No new complaints this morning Restarted home Vitamin D and B supplements Eliquis was started yesterday for afib, aspirin d/ced  ROS: unable to obtain due to mental status  Objective:   No results found. Recent Labs    07/23/22 0521  WBC 7.6  HGB 14.0  HCT 40.2  PLT 326   Recent Labs    07/23/22 0521  NA 139  K 3.9  CL 106  CO2 23  GLUCOSE 118*  BUN 16  CREATININE 1.08  CALCIUM 8.8*    Intake/Output Summary (Last 24 hours) at 07/24/2022 1528 Last data filed at 07/24/2022 1004 Gross per 24 hour  Intake 280 ml  Output --  Net 280 ml        Physical Exam: Vital Signs Blood pressure 123/67, pulse (!) 55, temperature 97.8 F (36.6 C), temperature source Oral, resp. rate 16, SpO2 94 %. Gen: no distress, normal appearing HEENT: oral mucosa pink and moist, NCAT Cardio: Bradycardic Chest: normal effort, normal rate of breathing Abd: soft, non-distended Ext: no edema Psych: pleasant, normal affect Skin: intact Neuro: Eyes open, alert and awake, able to state his name and hospital, follows simple commands difficulty with more complex commands, minimal verbal output.  Able to name 1 of 3 items.  Was not able to repeat.  PERRLA.  EOMI.  Able to turn head in both directions.  Sensation intact light touch in all 4 extremities Difficulty with MMT however however appears to be generally 4-/5 in right upper extremity, 3-4/5 in left upper extremity, 4 - in right lower extremity,2-3 out of 5 in left lower extremity Musculoskeletal: No joint swelling or tenderness noted, no abnormal tone noted, Normal muscle bulk   Assessment/Plan: 1. Functional deficits which require 3+ hours per day of interdisciplinary therapy in a comprehensive inpatient rehab setting. Physiatrist is providing close team supervision and 24 hour management of active medical problems listed below. Physiatrist  and rehab team continue to assess barriers to discharge/monitor patient progress toward functional and medical goals  Care Tool:  Bathing    Body parts bathed by patient: Right arm, Left arm, Chest, Abdomen, Front perineal area, Right upper leg, Buttocks, Left upper leg, Right lower leg, Left lower leg, Face         Bathing assist Assist Level: Minimal Assistance - Patient > 75%     Upper Body Dressing/Undressing Upper body dressing   What is the patient wearing?: Pull over shirt    Upper body assist Assist Level: Contact Guard/Touching assist    Lower Body Dressing/Undressing Lower body dressing      What is the patient wearing?: Pants     Lower body assist Assist for lower body dressing: Contact Guard/Touching assist     Toileting Toileting    Toileting assist Assist for toileting: Minimal Assistance - Patient > 75%     Transfers Chair/bed transfer  Transfers assist     Chair/bed transfer assist level: Minimal Assistance - Patient > 75%     Locomotion Ambulation   Ambulation assist      Assist level: Minimal Assistance - Patient > 75% Assistive device: No Device Max distance: 50  Walk 10 feet activity   Assist     Assist level: Minimal Assistance - Patient > 75% Assistive device: No Device   Walk 50 feet activity   Assist    Assist level: Minimal Assistance - Patient > 75% Assistive device: No Device    Walk 150 feet activity   Assist Walk 150 feet activity did not occur: Safety/medical concerns    Assistive device: No Device    Walk 10 feet on uneven surface  activity   Assist     Assist level: Minimal Assistance - Patient > 75% Assistive device: Other (comment) (no device)   Wheelchair     Assist Is the patient using a wheelchair?: Yes Type of Wheelchair: Manual    Wheelchair assist level: Minimal Assistance - Patient > 75% Max wheelchair distance: 20    Wheelchair 50 feet with 2 turns  activity    Assist        Assist Level: Moderate Assistance - Patient 50 - 74%   Wheelchair 150 feet activity     Assist      Assist Level: Total Assistance - Patient < 25%   Blood pressure 123/67, pulse (!) 55, temperature 97.8 F (36.6 C), temperature source Oral, resp. rate 16, SpO2 94 %.  Medical Problem List and Plan: 1. 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  Plan for loop recorder             -patient may shower             -ELOS/Goals: 12-14 days             Continue CIR PT/OT/SLP             -Needs wearable heart rhythm monitor per cardiology 2.  Atrial fibrillation: eliquis started and aspirin d/ced. EKG ordered 12/23 and showed sinus tachycardia, now bradycardic 3. Pain Management: Tylenol as needed 4. Impulsivity: telesitter ordered. Enclosure bed ordered.              -antipsychotic agents: Seroquel 12.5 mg twice daily 5. Neuropsych/cognition: This patient is not capable of making decisions on his own behalf. 6. Skin/Wound Care: Routine skin checks 7. Fluids/Electrolytes/Nutrition: Routine in and outs with follow-up chemistries 8.  Dysphagia.  Diet advanced to mechanical soft honey thick liquids by spoon.  Awaiting plan to continue nasogastric tube for nutritional support and hydration.  Follow-up speech therapy             -On IVF for 12 hours. Assess need for continuation of IVF 9.  COVID-positive.  Airborne/contact precautions.  Patient is on airborne precautions to 12/22 and stop 10.  Hyperlipidemia.  Crestor 31m 11. DM 2? HGB A1C 5.7. GBGs have not been elevated. 12. HTN. Not on any home medication. BP goal less than 180 with Long term goal normotensive  13. Hypocalcemia: 1 tablet tums started daily 14. Suboptimal vitamin D: home supplement restarted 15. B12 deficiency: B12 injection ordered 12/23, receives q2weeks  outpatient    LOS: 2 days A FACE TO FACE EVALUATION WAS PERFORMED  KClide DeutscherRaulkar 07/24/2022, 3:28 PM

## 2022-07-24 NOTE — Progress Notes (Signed)
Occupational Therapy Session Note  Patient Details  Name: Caspian Deleonardis MRN: [id number] Date of Birth: 01-17-1951  Today's Date: 07/24/2022 OT Individual Time: 1045-1200 OT Individual Time Calculation (min): 75 min    Short Term Goals: Week 1:  OT Short Term Goal 1 (Week 1): STG= LTG d/t ELOS  Skilled Therapeutic Interventions/Progress Updates:     Pt received standing up in room with wife present assisting Pt with dressing tasks upon OT arrival. Pt presenting to be in 0/10 pain and receptive to skilled OT session. Pt and wife provided education on hospital safety with a focus on Pt staying seated in chair/bed when therapy/nursing staff is not present in room, to call nursing staff using call bell for assistance, and fall prevention. Wife verbalized understanding and will continue to reinforce education to Pt.  Pt donned button up shirt in standing with CGA. Pt attempting to button shirt with mild LOB noted when focusing on task. Pt instructed to sit and Pt then able to button shirt with increased time. Pt ambulated to sink CGA without AD and brushed teeth, washed face, brushed his hair, and cleaned his glasses in standing. Pt required min multimodal cues and increased time for problem solving during self care tasks to initiate donning tooth paste, drying his hands with a paper towel, and to turn on/off sink. Pt able to following simple verbal commands to complete tasks. When given glasses wipe and instructed to clean glasses, Pt utilized wipe to his forehead requiring mod cues to transition task to cleaning glasses.  Pt able to noticed his shirt was buttoned unaligned when using mirror for visual feedback. Pt independently initiated correcting button alignment using mirror completing tasks with 100% with CGA and increased time.   Pt ambulated to bathroom CGA and completed 3/3 toileting tasks standing at toilet CGA. Following toileting, Pt ambulated to bed and transitioned EOB to supine with  CGA. Pt quickly falling asleep upon returning to bed.  Wife present and active during session. Wife requesting additional information regarding etiology of Pt CVA, recovery process, and outcome expectations. OT provided wife education, updates on Pt current functional status, and therapeutic listening and support.  Pt was left resting in enclosure bed with call bell in reach, tele-sitter on, and wife present in room with all immediate needs met.   Therapy Documentation Precautions:  Precautions Precautions: Fall Restrictions Weight Bearing Restrictions: No   ADL: ADL Eating: Supervision/safety Where Assessed-Eating: Bed level Grooming: Supervision/safety Where Assessed-Grooming: Edge of bed Upper Body Bathing: Supervision/safety Where Assessed-Upper Body Bathing: Shower Lower Body Bathing: Contact guard Where Assessed-Lower Body Bathing: Shower Upper Body Dressing: Supervision/safety Where Assessed-Upper Body Dressing: Sitting at sink Lower Body Dressing: Minimal assistance Where Assessed-Lower Body Dressing: Sitting at sink Toileting: Minimal assistance Where Assessed-Toileting: Glass blower/designer: Psychiatric nurse Method: Counselling psychologist: Energy manager: Minimal cueing, Minimal assistance Social research officer, government Method: Heritage manager: Shower seat with back   Therapy/Group: Individual Therapy  Janey Genta 07/24/2022, 12:24 PM

## 2022-07-25 DIAGNOSIS — I6381 Other cerebral infarction due to occlusion or stenosis of small artery: Secondary | ICD-10-CM | POA: Diagnosis not present

## 2022-07-25 MED ORDER — MAGNESIUM GLUCONATE 500 MG PO TABS
250.0000 mg | ORAL_TABLET | Freq: Every day | ORAL | Status: DC
  Administered 2022-07-25 – 2022-08-02 (×9): 250 mg via ORAL
  Filled 2022-07-25 (×9): qty 1

## 2022-07-25 NOTE — Progress Notes (Signed)
Speech Language Pathology Daily Session Note  Patient Details  Name: Douglas Cox MRN: [id number] Date of Birth: 03-17-1951  Today's Date: 07/25/2022 SLP Individual Time: 1345-1430 SLP Individual Time Calculation (min): 45 min  Short Term Goals: Week 1: SLP Short Term Goal 1 (Week 1): STG=LTG due to ELOS  Skilled Therapeutic Interventions:  Pt was seen for skilled ST targeting goals for dysphagia and communication.  Pt was received in enclosure bed, awake, flat affect, and agreeable to participate in treatment.  Pt propelled himself with his feet in the wheelchair to the ST treatment room.  Pt consumed 1-2 teaspoons of honey thick liquids with min cues for use of swallowing precautions and no overt s/s of aspiration.  SLP facilitated the session with a pipe tree task for ongoing diagnostic treatment of cognition and communication.  Pt was able to recreate structures out of PVC pipe from images with mod-max assist to recognize and correct errors.  Pt was able to follow 1-2 step commands within the context of the task with min assist multimodal cues.  Pt did not vocalize often throughout session and his responses were seldom pertinent to the questions asked, except for when pt was returned to the room and he saw the enclosure bed and he stated "I don't want to get in that."  Despite his initial hesitation, pt was easily redirected and returned to the enclosure bed without incident.  Nursing administered x1 medication at the end of today's session.  SLP originally thought that pt had swallowed pill but found that pt had held onto pill in his mouth and then chewed instead of swallowed which lead to a coughing episode.  Coughing cleared after pt was given a teaspoon of honey thick liquids to clear suspected diffuse pill residue.  Pt was left in enclosure bed with call bell within reach.  Continue per current plan of care.    Pain Pain Assessment Pain Scale: 0-10 Pain Score: 0-No  pain  Therapy/Group: Individual Therapy  Eward Rutigliano, Selinda [name] 07/25/2022, 3:14 PM

## 2022-07-25 NOTE — IPOC Note (Signed)
Overall Plan of Care [facility]) Patient Details Name: Douglas Cox MRN: [id number] DOB: October 19, 1950  Admitting Diagnosis: 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 [facility])  Hospital Problems: Principal Problem:   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 ([place])     Functional Problem List: Nursing Behavior, Bladder, Bowel, Safety, Sensory, Edema, Endurance, Medication Management, Motor, Pain  PT Balance, Behavior, Endurance, Motor, Safety  OT Balance, Endurance, Motor, Cognition, Behavior, Safety  SLP Behavior, Cognition, Linguistic, Nutrition  TR         Basic ADL's: OT Bathing, Dressing, Toileting     Advanced  ADL's: OT       Transfers: PT Bed Mobility, Bed to Chair, Car, [employer], Floor  OT Toilet, Metallurgist: PT Ambulation, Stairs, Wheelchair Mobility     Additional Impairments: OT None  SLP Swallowing, Communication, Social Cognition comprehension, expression Social Interaction, Problem Solving, Memory, Attention, Awareness  TR      Anticipated Outcomes Item Anticipated Outcome  Self Feeding no goal  Swallowing  min A   Basic self-care  (S)  Toileting  (S)   Bathroom Transfers (S)  Bowel/Bladder  continent x 2  Transfers  Mod I  Locomotion  Mod I  Communication  min A  Cognition  mod A  Pain  less than 4  Safety/Judgment  remain fall free while in rehab   Therapy Plan: PT Intensity: Minimum of 1-2 x/day ,45 to 90 minutes PT Frequency: 5 out of 7 days PT Duration Estimated Length of Stay: 7-10 days OT Intensity: Minimum of 1-2 x/day, 45 to 90 minutes OT Frequency: 5 out of 7 days OT Duration/Estimated Length of Stay: 7-10 days SLP Intensity: Minumum of 1-2 x/day, 30 to 90 minutes SLP Frequency: 3 to 5 out of 7 days SLP Duration/Estimated Length of Stay: 7-10 days   Team Interventions: Nursing Interventions Patient/Family Education, Pain Management, Bladder Management, Bowel Management, Medication  Management, Discharge Planning, Psychosocial Support, Disease Management/Prevention, Cognitive Remediation/Compensation  PT interventions Ambulation/gait training, Discharge planning, Functional mobility training, Therapeutic Activities, Balance/vestibular training, Neuromuscular re-education, Wheelchair propulsion/positioning, Therapeutic Exercise, UE/LE Strength taining/ROM, Community reintegration, Barrister's clerk education, IT trainer, UE/LE Coordination activities  OT Interventions Training and development officer, Discharge planning, Pain management, Self Care/advanced ADL retraining, Therapeutic Activities, UE/LE Coordination activities, Therapeutic Exercise, Patient/family education, Functional mobility training, Cognitive remediation/compensation, Community reintegration, Engineer, drilling, UE/LE Strength taining/ROM, Visual/perceptual remediation/compensation, Psychosocial support  SLP Interventions Cognitive remediation/compensation, Dysphagia/aspiration precaution training, Environmental controls, Functional tasks, Internal/external aids, Patient/family education, Speech/Language facilitation, Therapeutic Activities  TR Interventions    SW/CM Interventions Discharge Planning, Psychosocial Support, Patient/Family Education   Barriers to Discharge MD  Medical stability  Nursing Decreased caregiver support, Home environment access/layout, Incontinence, Lack of/limited family support, Medication compliance, Behavior home with spouse 2 level with 1/2 bath on main and bed up a flight of stairs with rail on left. 3 ste entry  PT Inaccessible home environment Bed and bath on 2nd floor.  OT      SLP Behavior impulsive, reduced safety awareness, reduced insight into deficits  SW Decreased caregiver support, Lack of/limited family support, Insurance underwriter for SNF coverage     Team Discharge Planning: Destination: PT-Home ,OT- Home , SLP-Home Projected Follow-up: PT-Outpatient PT, OT-   Outpatient OT, SLP-Home Health SLP, 24 hour supervision/assistance Projected Equipment Needs: PT-To be determined, OT- To be determined, SLP-To be determined Equipment Details: PT-probably none for PT., OT-  Patient/family involved in discharge planning: PT- Patient,  OT-Patient, SLP-Patient  MD ELOS: 12-14 days Medical Rehab  Prognosis:  Excellent Assessment: The patient has been admitted for CIR therapies with the diagnosis of b/l basal ganglia infarctions.The team will be addressing functional mobility, strength, stamina, balance, safety, adaptive techniques and equipment, self-care, bowel and bladder mgt, patient and caregiver education. Goals have been set at supervision. Anticipated discharge destination is home.        See Team Conference Notes for weekly updates to the plan of care

## 2022-07-25 NOTE — Progress Notes (Signed)
Occupational Therapy Session Note  Patient Details  Name: Douglas Cox MRN: [id number] Date of Birth: 02/01/1951  Today's Date: 07/25/2022 OT Individual Time: 1700-1749 OT Individual Time Calculation (min): 53 min  and Today's Date: 07/25/2022 OT Missed Time: 22 Minutes Missed Time Reason: Patient fatigue  Short Term Goals: Week 1:  OT Short Term Goal 1 (Week 1): STG= LTG d/t ELOS  Skilled Therapeutic Interventions/Progress Updates:  Skilled OT intervention completed with focus on functional transfers, following one step commands, initiation, cardiovascular endurance. Pt received semi-supine in enclosure bed, unable to waken with noxious environmental stimuli, requiring sternal rub and increased time to arouse. Pt remained aphasic, with minimal verbal responses but did verbalize one "yes" during session with some head nods utilized for communication. No pain gestured or suspected.  With visual cues provided to sit on EOB, pt was able to do so with supervision, then CGA sit > stand and stand pivot to w/c without AD. Pt utilized BLE to self-propel to ortho gym (pt initiated) with min A needed for turns/navigation. CGA sit > stand and stand pivot to nustep for cardiovascular endurance as per MD- wife requests pt to participate in aerobic exercises for her concern with his a-fib.  With hand over hand cues, pt was able to initiate using all 4 extremities on nustep with pt able to attend to task for 7 mins on level 2, with only 2 intermittent stops 2/2 distraction or suspected un-verbalized need for break despite it being offered. Utilized music for arousal (the Beatles), with pt's face lighting up with raised eyebrows on songs he appeared to recognize.  Completed stand pivot to w/c, then seated at [facility], attempted to have pt complete bells cancellation assessment to determine visual scanning however pt with poor initiation and motor planning with tapping the screen. Pt was able to shake head  "yes" to if the item was a bell, but unable to reach forward to tap, however with hand over hand assist to start the first 50% of the movement pt was able to continue the rest. Transitioned to speed dot reaction test, with pt initially not attending to the dots, however did improve with mod A fading to supervision for 3 dots before attention was lost and decreased arousal noted.  Transported dependently in w/c > room. Seated in w/c pt was able to catch/throw un-weighted ball to/from therapist without cues or difficulty and with steady pace. Completed the following exercises with yellow thera-band (2x10): shoulder abduction, bicep flexion each arm, shoulder external rotation. Required step by step cues and counting aloud.  Pt was noted to be dozing off during exercises, with inability to remain awake despite extra stimuli/conversation. Nursing reported that pt was very scattered/active early this AM even before therapy. Pt therefore missed 22 mins of OT 2/2 fatigue and inability to remain alert for therapy participation. Will make up missed time as able- with nursing made aware of pt status.  Assisted pt with CGA stand pivot to bed, then transitioned with supervision to upright in bed. Call bell in hand, enclosure bed fastened, telesitter on/activated, and with all needs in reach at end of session.    Therapy Documentation Precautions:  Precautions Precautions: Fall Restrictions Weight Bearing Restrictions: No   Therapy/Group: Individual Therapy  Blase Mess, MS, OTR/L  07/25/2022, 12:07 PM

## 2022-07-25 NOTE — Progress Notes (Signed)
PROGRESS NOTE   Subjective/Complaints: Updated wife regarding afib diagnosis. She is worried about increased bleeding risk given his  history of frequent hemorrhoids that cause bleeding  ROS: Unable to obtain due to mental status  Objective:   No results found. Recent Labs    07/23/22 0521  WBC 7.6  HGB 14.0  HCT 40.2  PLT 326   Recent Labs    07/23/22 0521  NA 139  K 3.9  CL 106  CO2 23  GLUCOSE 118*  BUN 16  CREATININE 1.08  CALCIUM 8.8*    Intake/Output Summary (Last 24 hours) at 07/25/2022 0914 Last data filed at 07/25/2022 0759 Gross per 24 hour  Intake 109 ml  Output --  Net 109 ml        Physical Exam: Vital Signs Blood pressure 121/68, pulse (!) 51, temperature 98.5 F (36.9 C), resp. rate 17, SpO2 95 %. Gen: no distress, normal appearing, BMI 22.55 HEENT: oral mucosa pink and moist, NCAT Cardio: Bradycardic Chest: normal effort, normal rate of breathing Abd: soft, non-distended Ext: no edema Psych: pleasant, normal affect Skin: intact Neuro: Eyes open, alert and awake, able to state his name and hospital, follows simple commands difficulty with more complex commands, minimal verbal output.  Able to name 1 of 3 items.  Was not able to repeat.  PERRLA.  EOMI.  Able to turn head in both directions.  Sensation intact light touch in all 4 extremities Difficulty with MMT however however appears to be generally 4-/5 in right upper extremity, 3-4/5 in left upper extremity, 4 - in right lower extremity,2-3 out of 5 in left lower extremity Musculoskeletal: No joint swelling or tenderness noted, no abnormal tone noted, Normal muscle bulk   Assessment/Plan: 1. Functional deficits which require 3+ hours per day of interdisciplinary therapy in a comprehensive inpatient rehab setting. Physiatrist is providing close team supervision and 24 hour management of active medical problems listed  below. Physiatrist and rehab team continue to assess barriers to discharge/monitor patient progress toward functional and medical goals  Care Tool:  Bathing    Body parts bathed by patient: Right arm, Left arm, Chest, Abdomen, Front perineal area, Right upper leg, Buttocks, Left upper leg, Right lower leg, Left lower leg, Face         Bathing assist Assist Level: Minimal Assistance - Patient > 75%     Upper Body Dressing/Undressing Upper body dressing   What is the patient wearing?: Pull over shirt    Upper body assist Assist Level: Contact Guard/Touching assist    Lower Body Dressing/Undressing Lower body dressing      What is the patient wearing?: Pants     Lower body assist Assist for lower body dressing: Contact Guard/Touching assist     Toileting Toileting    Toileting assist Assist for toileting: Minimal Assistance - Patient > 75%     Transfers Chair/bed transfer  Transfers assist     Chair/bed transfer assist level: Minimal Assistance - Patient > 75%     Locomotion Ambulation   Ambulation assist      Assist level: Minimal Assistance - Patient > 75% Assistive device: No Device Max distance: 50  Walk 10 feet activity   Assist     Assist level: Minimal Assistance - Patient > 75% Assistive device: No Device   Walk 50 feet activity   Assist    Assist level: Minimal Assistance - Patient > 75% Assistive device: No Device    Walk 150 feet activity   Assist Walk 150 feet activity did not occur: Safety/medical concerns    Assistive device: No Device    Walk 10 feet on uneven surface  activity   Assist     Assist level: Minimal Assistance - Patient > 75% Assistive device: Other (comment) (no device)   Wheelchair     Assist Is the patient using a wheelchair?: Yes Type of Wheelchair: Manual    Wheelchair assist level: Minimal Assistance - Patient > 75% Max wheelchair distance: 20    Wheelchair 50 feet with 2 turns  activity    Assist        Assist Level: Moderate Assistance - Patient 50 - 74%   Wheelchair 150 feet activity     Assist      Assist Level: Total Assistance - Patient < 25%   Blood pressure 121/68, pulse (!) 51, temperature 98.5 F (36.9 C), resp. rate 17, SpO2 95 %.  Medical Problem List and Plan: 1. 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  Plan for loop recorder             -patient may shower             -ELOS/Goals: 12-14 days             Continue CIR PT/OT/SLP             -Needs wearable heart rhythm monitor per cardiology 2.  Atrial fibrillation: eliquis started and aspirin d/ced. EKG ordered 12/23 and showed sinus tachycardia, now bradycardic, discussed with wife 3. Pain Management: Tylenol as needed 4. Impulsivity: telesitter ordered. Enclosure bed ordered.              -antipsychotic agents: continue Seroquel 12.5 mg twice daily 5. Neuropsych/cognition: This patient is not capable of making decisions on his own behalf. 6. Skin/Wound Care: Routine skin checks 7. Fluids/Electrolytes/Nutrition: Routine in and outs with follow-up chemistries 8.  Dysphagia.  Diet advanced to mechanical soft honey thick liquids by spoon.  Awaiting plan to continue nasogastric tube for nutritional support and hydration.  Follow-up speech therapy             -On IVF for 12 hours. Assess need for continuation of IVF 9.  COVID-positive.  Airborne/contact precautions.  Patient is on airborne precautions to 12/22 and stop. Discussed loss of taste may be related to COVID 10.  Hyperlipidemia.  Crestor 6m 11. DM 2? HGB A1C 5.7. GBGs have not been elevated. 12. HTN. Not on any home medication. BP goal less than 180 with Long term goal normotensive  13. Hypocalcemia: 1 tablet tums started daily 14. Suboptimal vitamin D: home supplement restarted 15. B12 deficiency: B12  injection ordered 12/23, receives q2weeks outpatient    LOS: 3 days A FACE TO FACE EVALUATION WAS PERFORMED  KClide DeutscherRaulkar 07/25/2022, 9:14 AM

## 2022-07-25 NOTE — Progress Notes (Signed)
Physical Therapy Session Note  Patient Details  Name: Douglas Cox MRN: [id number] Date of Birth: 06/01/1951  Today's Date: 07/25/2022 PT Individual Time: 0806-0902 PT Individual Time Calculation (min): 56 min   Short Term Goals: Week 1:  PT Short Term Goal 1 (Week 1): STG = LTG 2/2 ELOS  Skilled Therapeutic Interventions/Progress Updates:  Patient reclined in enclosure bed on entrance to room. Patient alert and agreeable to PT session.   COVID contact precautions have been lifted from pt and room. On rising to stand, pt noted to have had an episode of urinary incontinence. No clothes in room, paper clothes retrieved.   Patient with no pain complaint at start of session.  Therapeutic Activity: Bed Mobility: Pt performed supine <> sit with IND. No cues required.  Transfers: Pt performed sit<>stand and stand pivot transfers throughout session with supervision. Provided verbal cues for safety. On initial stance pt's pants and shirt noted to be wet. When provided with paper scrubs, he is able to determine pants and top and don with supervision with light MinA for balance with pants as pt self selects to don while standing.   Toilets with distant supervision.   Car transfer performed with supervision at [place].   Gait Training:  Pt ambulated > 500 ft continuously using no AD with CGA/ close supervision. Demonstrated decreased knee and hip flexion with increased lateral sway with LE advancement. Wife notes pt gait is changed from baseline. FGA performed and results below. Heavily distracted visually and auditorally in therapy gyms and hallways. Able to follow instructions for directionality in ambulating unit.   Neuromuscular Re-ed: NMR facilitated during session with focus on dynamic gait. Pt guided in functional gait testing and 5xSTS. See results below indicating pt is a high fall risk. Pt required extensive cueing to continue sit<>stands despite verbal instructions,  requesting pt to count each rep, and then counting off with pt throughout. At end of timed test, pt unable to relate how many he had performed.   Is able to follow instructions to perform FGA, except for maintaining eyes closed as pt highly distractible and curious of surroundings.  NMR performed for improvements in motor control and coordination, balance, sequencing, judgement, and self confidence/ efficacy in performing all aspects of mobility at highest level of independence.   Patient seated upright in recliner with wife present at end of session, brakes locked, and all needs within reach. No alarm set as wife present with pt and awaiting MD rounding.    Therapy Documentation Precautions:  Precautions Precautions: Fall Restrictions Weight Bearing Restrictions: No General:   Vital Signs:  Pain:  No complaint of pain this session.   Locomotion : Gait Gait Distance (Feet): 500 Feet Gait Gait Pattern:  (increased lateral sway with decreased hip/ knee flexion) Gait velocity: decreased  Trunk/Postural Assessment :    Balance: Standardized Balance Assessment Standardized Balance Assessment: Functional Gait Assessment (5xSTS = 41.75 sec) Functional Gait  Assessment Gait Level Surface: Walks 20 ft in less than 7 sec but greater than 5.5 sec, uses assistive device, slower speed, mild gait deviations, or deviates 6-10 in outside of the 12 in walkway width. Change in Gait Speed: Able to change speed, demonstrates mild gait deviations, deviates 6-10 in outside of the 12 in walkway width, or no gait deviations, unable to achieve a major change in velocity, or uses a change in velocity, or uses an assistive device. Gait with Horizontal Head Turns: Performs head turns with moderate changes in gait velocity,  slows down, deviates 10-15 in outside 12 in walkway width but recovers, can continue to walk. Gait with Vertical Head Turns: Performs task with slight change in gait velocity (eg, minor  disruption to smooth gait path), deviates 6 - 10 in outside 12 in walkway width or uses assistive device Gait and Pivot Turn: Turns slowly, requires verbal cueing, or requires several small steps to catch balance following turn and stop Step Over Obstacle: Is able to step over one shoe box (4.5 in total height) without changing gait speed. No evidence of imbalance. Gait with Narrow Base of Support: Ambulates 4-7 steps. Gait with Eyes Closed: Walks 20 ft, uses assistive device, slower speed, mild gait deviations, deviates 6-10 in outside 12 in walkway width. Ambulates 20 ft in less than 9 sec but greater than 7 sec. Ambulating Backwards: Walks 20 ft, uses assistive device, slower speed, mild gait deviations, deviates 6-10 in outside 12 in walkway width. Steps: Alternating feet, must use rail. Total Score: 17 FGA comment:: 17/30 = high fall risk   Therapy/Group: Individual Therapy  Alger Simons PT, DPT, CSRS 07/25/2022, 10:09 AM

## 2022-07-26 DIAGNOSIS — I6381 Other cerebral infarction due to occlusion or stenosis of small artery: Secondary | ICD-10-CM | POA: Diagnosis not present

## 2022-07-26 NOTE — Progress Notes (Signed)
PROGRESS NOTE   Subjective/Complaints: No new complaints Sleep[place] Patient's chart reviewed- No issues reported overnight  ROS: Unable to obtain due to mental status  Objective:   No results found. No results for input(s): "WBC", "HGB", "HCT", "PLT" in the last 72 hours.  No results for input(s): "NA", "K", "CL", "CO2", "GLUCOSE", "BUN", "CREATININE", "CALCIUM" in the last 72 hours.   Intake/Output Summary (Last 24 hours) at 07/26/2022 1121 Last data filed at 07/25/2022 1854 Gross per 24 hour  Intake 177 ml  Output --  Net 177 ml        Physical Exam: Vital Signs Blood pressure 118/60, pulse (!) 49, temperature 98.2 F (36.8 C), temperature source Oral, resp. rate 17, SpO2 98 %. Gen: no distress, normal appearing, BMI 22.55 HEENT: oral mucosa pink and moist, NCAT Cardio: Bradycardic Chest: normal effort, normal rate of breathing Abd: soft, non-distended Ext: no edema Psych: pleasant, normal affect Skin: intact Neuro: Eyes open, alert and awake, able to state his name and hospital, follows simple commands difficulty with more complex commands, minimal verbal output.  Able to name 1 of 3 items.  Was not able to repeat.  PERRLA.  EOMI.  Able to turn head in both directions.  Sensation intact light touch in all 4 extremities Difficulty with MMT however however appears to be generally 4-/5 in right upper extremity, 3-4/5 in left upper extremity, 4 - in right lower extremity,2-3 out of 5 in left lower extremity Musculoskeletal: No joint swelling or tenderness noted, no abnormal tone noted, Normal muscle bulk   Assessment/Plan: 1. Functional deficits which require 3+ hours per day of interdisciplinary therapy in a comprehensive inpatient rehab setting. Physiatrist is providing close team supervision and 24 hour management of active medical problems listed below. Physiatrist and rehab team continue to assess barriers to  discharge/monitor patient progress toward functional and medical goals  Care Tool:  Bathing    Body parts bathed by patient: Right arm, Left arm, Chest, Abdomen, Front perineal area, Right upper leg, Buttocks, Left upper leg, Right lower leg, Left lower leg, Face         Bathing assist Assist Level: Minimal Assistance - Patient > 75%     Upper Body Dressing/Undressing Upper body dressing   What is the patient wearing?: Pull over shirt    Upper body assist Assist Level: Contact Guard/Touching assist    Lower Body Dressing/Undressing Lower body dressing      What is the patient wearing?: Pants     Lower body assist Assist for lower body dressing: Contact Guard/Touching assist     Toileting Toileting    Toileting assist Assist for toileting: Minimal Assistance - Patient > 75%     Transfers Chair/bed transfer  Transfers assist     Chair/bed transfer assist level: Minimal Assistance - Patient > 75%     Locomotion Ambulation   Ambulation assist      Assist level: Minimal Assistance - Patient > 75% Assistive device: No Device Max distance: 50   Walk 10 feet activity   Assist     Assist level: Minimal Assistance - Patient > 75% Assistive device: No Device   Walk 50 feet activity  Assist    Assist level: Minimal Assistance - Patient > 75% Assistive device: No Device    Walk 150 feet activity   Assist Walk 150 feet activity did not occur: Safety/medical concerns    Assistive device: No Device    Walk 10 feet on uneven surface  activity   Assist     Assist level: Minimal Assistance - Patient > 75% Assistive device: Other (comment) (no device)   Wheelchair     Assist Is the patient using a wheelchair?: Yes Type of Wheelchair: Manual    Wheelchair assist level: Minimal Assistance - Patient > 75% Max wheelchair distance: 20    Wheelchair 50 feet with 2 turns activity    Assist        Assist Level: Moderate  Assistance - Patient 50 - 74%   Wheelchair 150 feet activity     Assist      Assist Level: Total Assistance - Patient < 25%   Blood pressure 118/60, pulse (!) 49, temperature 98.2 F (36.8 C), temperature source Oral, resp. rate 17, SpO2 98 %.  Medical Problem List and Plan: 1. 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  Plan for loop recorder             -patient may shower             -ELOS/Goals: 12-14 days             Continue CIR PT/OT/SLP             -Needs wearable heart rhythm monitor per cardiology 2.  Atrial fibrillation: eliquis started and aspirin d/ced. EKG ordered 12/23 and showed sinus tachycardia, now bradycardic, discussed with wife 3. Pain Management: Tylenol as needed 4. Impulsivity: telesitter ordered. Enclosure bed ordered.              -antipsychotic agents: continue Seroquel 12.5 mg twice daily 5. Neuropsych/cognition: This patient is not capable of making decisions on his own behalf. 6. Skin/Wound Care: Routine skin checks 7. Fluids/Electrolytes/Nutrition: Routine in and outs with follow-up chemistries 8.  Dysphagia.  Diet advanced to mechanical soft honey thick liquids by spoon.  Awaiting plan to continue nasogastric tube for nutritional support and hydration.  Follow-up speech therapy             -d/c IVF 9.  Hx of COVID:  Airborne/contact precautions d/ced. Discussed loss of taste may be related to COVID 10.  Hyperlipidemia.  continue Crestor 60m 11. DM 2? HGB A1C 5.7. GBGs have not been elevated. 12. HTN. Not on any home medication. BP goal less than 180 with Long term goal normotensive  13. Hypocalcemia: 1 tablet tums started daily 14. Suboptimal vitamin D: home supplement restarted 15. B12 deficiency: B12 injection ordered 12/23, receives q2weeks outpatient 16. Bradycardia: discussed with wife    LOS: 4 days A FACE TO FACE  EVALUATION WAS PERFORMED  Koichi Platte P Hudson Lehmkuhl 07/26/2022, 11:21 AM

## 2022-07-27 DIAGNOSIS — I6381 Other cerebral infarction due to occlusion or stenosis of small artery: Secondary | ICD-10-CM | POA: Diagnosis not present

## 2022-07-27 MED ORDER — MODAFINIL 100 MG PO TABS
100.0000 mg | ORAL_TABLET | Freq: Every day | ORAL | Status: DC
  Administered 2022-07-28 – 2022-08-02 (×6): 100 mg via ORAL
  Filled 2022-07-27 (×6): qty 1

## 2022-07-27 MED ORDER — SORBITOL 70 % SOLN
60.0000 mL | Freq: Once | Status: AC
  Administered 2022-07-27: 60 mL via ORAL
  Filled 2022-07-27: qty 60

## 2022-07-27 NOTE — Progress Notes (Signed)
PROGRESS NOTE   Subjective/Complaints:  Somnolent but awakens briefly to voice, follows simple commands ROS: Unable to obtain due to mental status  Objective:   No results found. No results for input(s): "WBC", "HGB", "HCT", "PLT" in the last 72 hours.  No results for input(s): "NA", "K", "CL", "CO2", "GLUCOSE", "BUN", "CREATININE", "CALCIUM" in the last 72 hours.   Intake/Output Summary (Last 24 hours) at 07/27/2022 0709 Last data filed at 07/26/2022 1547 Gross per 24 hour  Intake 170 ml  Output --  Net 170 ml         Physical Exam: Vital Signs Blood pressure 136/62, pulse (!) 46, temperature 98.2 F (36.8 C), resp. rate 16, SpO2 100 %. Gen: no distress, normal appearing, BMI 22.55 HEENT: oral mucosa pink and moist, NCAT Cardio: Bradycardic Chest: normal effort, normal rate of breathing Abd: soft, non-distended Ext: no edema Psych: pleasant, normal affect Skin: intact Neuro:somnolent but awakens to voice, follows simple commands with hand grasp bilaterally and wiggles toes bilaterally  Could not assess sensation due to somnolence, did not answer questions this am    Assessment/Plan: 1. Functional deficits which require 3+ hours per day of interdisciplinary therapy in a comprehensive inpatient rehab setting. Physiatrist is providing close team supervision and 24 hour management of active medical problems listed below. Physiatrist and rehab team continue to assess barriers to discharge/monitor patient progress toward functional and medical goals  Care Tool:  Bathing    Body parts bathed by patient: Right arm, Left arm, Chest, Abdomen, Front perineal area, Right upper leg, Buttocks, Left upper leg, Right lower leg, Left lower leg, Face         Bathing assist Assist Level: Minimal Assistance - Patient > 75%     Upper Body Dressing/Undressing Upper body dressing   What is the patient wearing?: Pull over  shirt    Upper body assist Assist Level: Contact Guard/Touching assist    Lower Body Dressing/Undressing Lower body dressing      What is the patient wearing?: Pants     Lower body assist Assist for lower body dressing: Contact Guard/Touching assist     Toileting Toileting    Toileting assist Assist for toileting: Minimal Assistance - Patient > 75%     Transfers Chair/bed transfer  Transfers assist     Chair/bed transfer assist level: Minimal Assistance - Patient > 75%     Locomotion Ambulation   Ambulation assist      Assist level: Minimal Assistance - Patient > 75% Assistive device: No Device Max distance: 50   Walk 10 feet activity   Assist     Assist level: Minimal Assistance - Patient > 75% Assistive device: No Device   Walk 50 feet activity   Assist    Assist level: Minimal Assistance - Patient > 75% Assistive device: No Device    Walk 150 feet activity   Assist Walk 150 feet activity did not occur: Safety/medical concerns    Assistive device: No Device    Walk 10 feet on uneven surface  activity   Assist     Assist level: Minimal Assistance - Patient > 75% Assistive device: Other (comment) (no device)  Wheelchair     Assist Is the patient using a wheelchair?: Yes Type of Wheelchair: Manual    Wheelchair assist level: Minimal Assistance - Patient > 75% Max wheelchair distance: 20    Wheelchair 50 feet with 2 turns activity    Assist        Assist Level: Moderate Assistance - Patient 50 - 74%   Wheelchair 150 feet activity     Assist      Assist Level: Total Assistance - Patient < 25%   Blood pressure 136/62, pulse (!) 46, temperature 98.2 F (36.8 C), resp. rate 16, SpO2 100 %.  Medical Problem List and Plan: 1. 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  Plan for loop  recorder             -patient may shower             -ELOS/Goals: 12-14 days             Continue CIR PT/OT/SLP             -Needs wearable heart rhythm monitor per cardiology Team conference today please see physician documentation under team conference tab, met with team  to discuss problems,progress, and goals. Formulized individual treatment plan based on medical history, underlying problem and comorbidities.  2.  Atrial fibrillation: eliquis started and aspirin d/ced. EKG ordered 12/23 and showed sinus tachycardia, now bradycardic, discussed with wife 3. Pain Management: Tylenol as needed 4. Impulsivity: telesitter ordered. Enclosure bed ordered.              -antipsychotic agents: continue Seroquel 12.5 mg twice daily 5. Neuropsych/cognition: This patient is not capable of making decisions on his own behalf. 6. Skin/Wound Care: Routine skin checks 7. Fluids/Electrolytes/Nutrition: Routine in and outs with follow-up chemistries 8.  Dysphagia.  Diet advanced to mechanical soft honey thick liquids by spoon.  Awaiting plan to continue nasogastric tube for nutritional support and hydration.  Follow-up speech therapy             -d/c IVF 9.  Hx of COVID:  Airborne/contact precautions d/ced. Discussed loss of taste may be related to COVID 10.  Hyperlipidemia.  continue Crestor 73m 11. DM 2? HGB A1C 5.7. CBGs have not been elevated. 12. HTN. Controlled off meds 13. Hypocalcemia: 1 tablet tums started daily 14. Suboptimal vitamin D: home supplement restarted 15. B12 deficiency: B12 injection ordered 12/23, receives q2weeks outpatient 16. Bradycardia:  Vitals:   07/26/22 1945 07/27/22 0643  BP: 132/68 136/62  Pulse: 60 (!) 46  Resp: 18 16  Temp: 98.4 F (36.9 C) 98.2 F (36.8 C)  SpO2: 93% 100%   EKG 12/22 with sinus brady unchanged vs Aug 2022, not on negative chronotropic meds    LOS: 5 days A FACE TO FACE EVALUATION WAS PERFORMED  ACharlett Blake12/26/2023, 7:09 AM

## 2022-07-27 NOTE — Progress Notes (Addendum)
Patient ID: Douglas Cox, male   DOB: [birth year], 71 y.o.   MRN: [id number]  Covering for Primary SW, Auria C,    SW made attempt to call patient spouse, Douglas Cox to provide conference updates. Left detailed VM. Sw will follow up with pt and spouse.    1:47PM: Sw met with patient and spouse in room and provided conference updates. Spouse concerned about 24/7 due to her currently still working. Sw provided [facility] resources. Spouse provided FMLA paperwork to completed by 08/06/2021.

## 2022-07-27 NOTE — Progress Notes (Signed)
Occupational Therapy Session Note  Patient Details  Name: Tereso Unangst MRN: [id number] Date of Birth: 12/09/1950  Today's Date: 07/27/2022 OT Individual Time: 1300-1330 OT Individual Time Calculation (min): 30 min    Short Term Goals: Week 1:  OT Short Term Goal 1 (Week 1): STG= LTG d/t ELOS  Skilled Therapeutic Interventions/Progress Updates:    Pt received supine with no c/o pain, agreeable to OT session via nodding. Wife present. Pt requiring cueing for initiation, sustained attention to task, and simple/direct cueing with visual cue helpful to follow commands during shower level ADLs. Pt requiring no more than CGA- (S) overall for shower with appropriate sequencing of steps during familiar routine. Education provided to wife on cueing technique. He donned all clothing with (S). He was passed off to SLP in hallway following 150 ft of functional mobility with CGA d/t poor attention to task and frequently turning his head d/t distractions.   Therapy Documentation Precautions:  Precautions Precautions: Fall Restrictions Weight Bearing Restrictions: No  Therapy/Group: Individual Therapy  Curtis Sites 07/27/2022, 6:56 AM

## 2022-07-27 NOTE — Patient Care Conference (Signed)
Inpatient RehabilitationTeam Conference and Plan of Care Update Date: 07/27/2022   Time: 10:32 AM    Patient Name: Douglas Cox      Medical Record Number: [id number]  Date of Birth: [birth year] Sex: Male         Room/Bed: 4M12C/4M12C-01 Payor Info: Payor: [place] / Plan: BCBS MEDICARE / Product Type: *No Product type* /    Admit Date/Time:  07/22/2022  4:38 PM  Primary Diagnosis:  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 [facility])  Hospital Problems: Principal Problem:   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 [facility])    Expected Discharge Date: Expected Discharge Date: 08/03/22  Team Members Present: Physician leading conference: Dr. Jennye Boroughs Social Worker Present: Erlene Quan, BSW Nurse Present: Tacy Learn, RN PT Present: Tereasa Coop, PT OT Present: Laverle Hobby, OT SLP Present: Weston Anna, SLP PPS Coordinator present : Gunnar Fusi, SLP     Current Status/Progress Goal Weekly Team Focus  Bowel/Bladder   Continent x2. Can be incontinent.   Remain continent,   Assist toileting needs.    Swallow/Nutrition/ Hydration   Dys. 3 textures with honey-thivk liquids via tsp, Mod A   Min A  tolerance of current diet, use of swalloing compensatory strategies    ADL's   (S)- CGA overall for ADL transfers, occasional cueing for [facility] tasks like buttons, min -mod cueing for familiar routine   Supervision overall   ADLs, transfers,    Mobility   supervision bed mobility, CGA transfers and ambulation (very occasional minA when distracted)   supervision to mod(I)  family ed, DC prep    Communication   Mod A   Min A   use of speech intelligibility strategies    Safety/Cognition/ Behavioral Observations  Mod-Max A   Mod A   orientation, sustained attention, problem solving, intellectual awareness    Pain   Denies pain.   Pain of 0.   Monitor pain q shift prn.    Skin   CDI   No skin  breakdown.  Monitor skin q shift prn.      Discharge Planning:  Discharging home with spouse, working on changing her work schedule to [employer]. Wife reports they will have a good amount of support.   Team Discussion: CVA. Covid precautions removed. Continent or urine. No BM documented. Sorbitol. Occasional back pain. Skin is CDI. Zio patch placed 12/22. Afib detected 12/22 Dr Leonie Man aware. Enclosure bed. IV fluids stopped. B/P controlled.  Poor safety awareness and limited cognition. Wife educated on transfers.  Patient on target to meet rehab goals: yes, wife working with therapy  *See Care Plan and progress notes for long and short-term goals.   Revisions to Treatment Plan:  Zio patch, medication adjustments, monitor labs  Teaching Needs: Medications, safety, gait/transfer training, etc.   Current Barriers to Discharge: Decreased caregiver support, Home enviroment access/layout, Incontinence, Medication compliance, and Behavior  Possible Resolutions to Barriers: Family education, nursing education, order recommended DME     Medical Summary Current Status: Continued poor safety awareness, limited cognition  Barriers to Discharge: Behavior/Mood   Possible Resolutions to [employer] Focus: Continue with enclosure bed, continue comprehensive intensive inpatient rehabilitation, neurostimulant trial   Continued Need for Acute Rehabilitation Level of Care: The patient requires daily medical management by a physician with specialized training in physical medicine and rehabilitation for the following reasons: Direction of a multidisciplinary physical rehabilitation program to maximize functional independence : Yes Medical management of patient stability for increased activity  during participation in an intensive rehabilitation regime.: Yes Analysis of laboratory values and/or radiology reports with any subsequent need for medication adjustment and/or medical intervention. : Yes   I  attest that I was present, lead the team conference, and concur with the assessment and plan of the team.   Ernest Pine 07/27/2022, 1:54 PM

## 2022-07-27 NOTE — Progress Notes (Signed)
Inpatient [facility] Individual Statement of Services  Patient Name:  Daymian Lill  Date:  07/27/2022  Welcome to the [facility].  Our goal is to provide you with an individualized program based on your diagnosis and situation, designed to meet your specific needs.  With this comprehensive rehabilitation program, you will be expected to participate in at least 3 hours of rehabilitation therapies Monday-Friday, with modified therapy programming on the weekends.  Your rehabilitation program will include the following services:  Physical Therapy (PT), Occupational Therapy (OT), Speech Therapy (ST), 24 hour per day rehabilitation nursing, Therapeutic Recreaction (TR), Neuropsychology, Care Coordinator, Rehabilitation Medicine, Nutrition Services, and Pharmacy Services  Weekly team conferences will be held on Tuesday to discuss your progress.  Your Inpatient Rehabilitation Care Coordinator will talk with you frequently to get your input and to update you on team discussions.  Team conferences with you and your family in attendance may also be held.  Expected length of stay: 7-10 days  Overall anticipated outcome: supervision with cues  Depending on your progress and recovery, your program may change. Your Inpatient Rehabilitation Care Coordinator will coordinate services and will keep you informed of any changes. Your Inpatient Rehabilitation Care Coordinator's name and contact numbers are listed  below.  The following services may also be recommended but are not provided by the [facility] will be made to provide these services after discharge if needed.  Arrangements include referral to agencies that provide these services.  Your insurance has been verified to be:  [employer] primary doctor isNone  Pertinent information  will be shared with your doctor and your insurance company.  Inpatient Rehabilitation Care Coordinator:  Cathleen Corti [phone] or [phone]  Information discussed with and copy given to patient by: Elease Hashimoto, 07/27/2022, 10:57 AM

## 2022-07-27 NOTE — Progress Notes (Signed)
Physical Therapy Session Note  Patient Details  Name: Douglas Cox MRN: [id number] Date of Birth: [birth year]  Today's Date: 07/27/2022 PT Individual Time: 0900-0954 PT Individual Time Calculation (min): 54 min   Short Term Goals: Week 1:  PT Short Term Goal 1 (Week 1): STG = LTG 2/2 ELOS  Skilled Therapeutic Interventions/Progress Updates:     Pt received supine asleep in posey bed. Easily aroused but slow to begin moving, requiring extended time and cueing. Pt's wife arrives and provides encouragement. Supine to sit with cues for positioning at EOB. Pt is very minimally verbal but his wife indicates that he is depressed and has also been having back pain. PT provides rest breaks and gentle mobility to address back pain. Sit to stand with CGA and cues for initiation. Pt ambulates x300' with CGA and cues for navigation and remaining on task, as pt is easily distracted. Pt performs Nustep for endurance training and reciprocal coordination. Pt completes x12:00 at workload of 4 with average steps per minute ~25. Several cues required for pt to remain on task as he becomes easily internally and externally distracted.  Pt ambulates without AD for balance and endurance training. Pt ambulates >1000' with CGA and cues for upright gaze to improve posture and balance, and increasing gait speed to decrease risk for falls. Pt also completes x12 6" steps with bilateral hand rails and cues for sequencing. Following, pt lays down in bed and closes eyes, appearing fatigued. Pt left supine with wife present.   Therapy Documentation Precautions:  Precautions Precautions: Fall Restrictions Weight Bearing Restrictions: No   Therapy/Group: Individual Therapy  Breck Coons. PT, DPT 07/27/2022, 5:01 PM

## 2022-07-27 NOTE — Progress Notes (Signed)
Speech Language Pathology Daily Session Note  Patient Details  Name: Douglas Cox MRN: [id number] Date of Birth: Aug 29, 1950  Today's Date: 07/27/2022 SLP Individual Time: 1330-1415 SLP Individual Time Calculation (min): 45 min  Short Term Goals: Week 1: SLP Short Term Goal 1 (Week 1): STG=LTG due to ELOS  Skilled Therapeutic Interventions: Skilled treatment session focused on cognitive, communication and swallowing goals. SLP facilitated session by providing overall Mod A verbal and visual cues for sustained attention and functional problem solving during a basic money management task. Patient required extra time to complete tasks as well as verbally respond to clinician questions regarding orientation information. External aids were created to maximize recall and carryover of functional information. Patient's wife present and also educated on importance of use of aids to help facilitate orientation. Throughout session, patient's speech intelligibility was significantly reduced due to a low vocal intensity. Despite Max A multimodal cues, patient unable to utilize an increased vocal intensity with the exception of 1 instance at the word level. 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 Patient with decreased intake of honey-thick liquids, therefore, SLP provided education to patient's wife regarding ability to thicken any liquid of patient's choice with thickening packets. Patient consumed tsps of honey-thick hot tea (Nursing aware) with Max verbal cues needed to reduce impulsivity with intake (attempting to drink from cup despite temperature and cues). Patient's wife also educated on appropriate textures as she reported giving the patient peanut butter and small bites of raw carrots. She verbalized understanding. Patient left sitting upright in the recliner with gait belt on and wife present. Continue with current plan  of care.      Pain No/Denies Pain   Therapy/Group: Individual Therapy  Miel Wisener 07/27/2022, 3:13 PM

## 2022-07-27 NOTE — Progress Notes (Signed)
Physical Therapy Session Note  Patient Details  Name: Aneudy Champlain MRN: [id number] Date of Birth: [birth year]  Today's Date: 07/27/2022 PT Individual Time: 6237-6283 PT Individual Time Calculation (min): 20 min   Short Term Goals: Week 1:  PT Short Term Goal 1 (Week 1): STG = LTG 2/2 ELOS  Skilled Therapeutic Interventions/Progress Updates:      Therapy Documentation Precautions:  Precautions Precautions: Fall Restrictions Weight Bearing Restrictions: No  Pt agreeable to PT session with encouragement and family present. Pt without verbal complaints of pain in session. Pt requires close (S) for STS transfer and CGA with gait >300 ft in session without an AD. Pt requires max cueing for selective attention throughout session. Pt unable to follow multi-step commands or simple verbal commands in session and did respond well to visual cues with functional mobility. Attempted to have pt walk with lateral head turns and pt unable to attend and became fixated on water fountains and reading room numbers. Pt ambulated to room and with encouragement agreed to continue session. Pt performed ball toss and bouncing to family member while walking forward, backward and laterally with CGA. Pt ambulated and returned to room and reported fatigue and stated "maybe tomorrow". Pt missed total of 25 skilled minutes of PT due to fatigue and unwillingness to participate. Pt left seated in recliner with family member present and all needs within reach.     Therapy/Group: Individual Therapy  Verl Dicker Verl Dicker PT, DPT  07/27/2022, 12:37 PM

## 2022-07-28 ENCOUNTER — Telehealth: Payer: Self-pay

## 2022-07-28 DIAGNOSIS — I6381 Other cerebral infarction due to occlusion or stenosis of small artery: Secondary | ICD-10-CM | POA: Diagnosis not present

## 2022-07-28 MED ORDER — SODIUM CHLORIDE 0.45 % IV BOLUS
500.0000 mL | Freq: Once | INTRAVENOUS | Status: AC
  Administered 2022-07-28: 500 mL via INTRAVENOUS

## 2022-07-28 MED ORDER — MEGESTROL ACETATE 400 MG/10ML PO SUSP
400.0000 mg | Freq: Two times a day (BID) | ORAL | Status: DC
  Administered 2022-07-28 – 2022-08-01 (×9): 400 mg via ORAL
  Filled 2022-07-28 (×10): qty 10

## 2022-07-28 NOTE — Progress Notes (Signed)
Patient and wife not in room. Placed information about Zio patch in educational binder.

## 2022-07-28 NOTE — Progress Notes (Signed)
Occupational Therapy Session Note  Patient Details  Name: Douglas Cox MRN: [id number] Date of Birth: August 10, 1950  Today's Date: 07/28/2022 OT Individual Time: 1350-1440 OT Individual Time Calculation (min): 50 min    Short Term Goals: Week 1:  OT Short Term Goal 1 (Week 1): STG= LTG d/t ELOS  Skilled Therapeutic Interventions/Progress Updates:    Pt and wife in room upon entering. Pt hooked up to IV and compltes toileting with A to manage IV pole and CGA-S in standing/navigating over ramped threshold. Pt participates in vision screening with inconclusive results mostly d/t pt demo poor attention and difficulty following instructions to novel tasks. Pt demo poor tracking to R and lower quadrants with extra eye shifts noted. Pt also demo difficulty with convergence. Pt able to count how many lines drawn on paper and accurately complete line bisection. Attempted letter cancellation an pt unable to recall which letter to cross out after ~1 min crossing out first letter of every word. Discussed with wife about implications. Wife requesting things to do in room or at home. Provied list written below.   Simple Cognitive Activities Helping with chores: unloading the dishwasher, sorting/folding laundry, putting groceries away, feeding a pet, sweeping the floor, getting out ingredients for dinner Tabletop activites: sorting cards by color or suit, connect 4, scrabble tiles can sort or try to spell words with larger # of times, tie fishing lures,  Walking outside: name brands of cars that drive by, name things of a certain color, name types of animals/trees, eventually have him try to give directions back if in a familiar environment, have a list of things to go look for and take pictures of--cross of items on list when completed Keep track of a calendar/schedule every day to help keep track of time/date and what has been done that day. Can write up on a big white board and cross off when things are  done for the day  Exited room with pt seated at edge of enclosure bed. Wife in room. RN to enter to disconnect the IV  Therapy Documentation Precautions:  Precautions Precautions: Fall Restrictions Weight Bearing Restrictions: No General:   Vital Signs:  Therapy/Group: Individual Therapy  Tonny Branch 07/28/2022, 12:20 PM

## 2022-07-28 NOTE — Progress Notes (Signed)
Patient ID: Douglas Cox, male   DOB: 10-Oct-1950, 71 y.o.   MRN: [id number]  Met with wife who is here to answer her questions regarding pt's care needs, insurance coverage and insurance switching 08/02/2022. Discussed insurance typically cover home health and not what they call custodial care which is a caregiver to be there when she can not. She felt he was doing well yesterday but today is not doing as well and quite sleepy. He is not sleeping at night so sleeps during the day. According to Dan-PA he will be moved to 4000 today. Discussed will need to get him out of the [place]-enclosure bed prior to going home. Wife reports he is physically doing well and moves well it really is his cognition. She is aware of his need for 24/7 supervision at discharge. She has given FMLA forms to be completed. Wife is here daily and see's him in therapies. Will continue to work on discharge plans.

## 2022-07-28 NOTE — Progress Notes (Signed)
Physical Therapy Session Note  Patient Details  Name: Douglas Cox MRN: [id number] Date of Birth: 05/13/1951  Today's Date: 07/28/2022 PT Individual Time: 7471-5953 PT Individual Time Calculation (min): 44 min   Short Term Goals: Week 1:  PT Short Term Goal 1 (Week 1): STG = LTG 2/2 ELOS  Skilled Therapeutic Interventions/Progress Updates:     Upon PT entry into room, pt soundly asleep in posey bed and wife present, who states that he did not sleep much at all last night. PT provides education to pt's wife regarding strategies for normalizing sleep-wake cycle while in rehab but also following discharge home. Pt awakens during education and appears agreeable to therapy. No indication of pain. Supine to sit with cues for positioning at EOB. Sit to stand with cues for initiation. Pt ambulates to gym with CGA and cues for upright gaze to improve posture and balance, and increasing gait speed to decrease risk for falls. Pt participates in ball toss activity with trampoline and 1kg medicine ball to challenge dynamic balance, coordination, and protective postural responses. PT provides CGA and cues to lift ball overhead for increased core engagement and balance challenge. 3x20 with seated rest breaks. Pt then given basketball and cued to shoot at basketball hoop, but he has difficulty with power necessary to reach hoop. Activity adjusted, and pt instead ambulates while tossing ball with wife, who walks backward in front of patient. Performed to provide multitasking and coordination challenge during ambulation. Pt ambulates total of x600' with several brief standing rest breaks, and tosses ball back and forth with wife for entire bout. PT provides CGA and cues for posture and navigation. Pt left seated in bed with wife present.  Therapy Documentation Precautions:  Precautions Precautions: Fall Restrictions Weight Bearing Restrictions: No  Therapy/Group: Individual Therapy  Breck Coons, PT,  DPT 07/28/2022, 4:58 PM

## 2022-07-28 NOTE — Progress Notes (Signed)
Speech Language Pathology Daily Session Note  Patient Details  Name: Douglas Cox MRN: [id number] Date of Birth: 07/26/1951  Today's Date: 07/28/2022 SLP Individual Time: 0720-0800 SLP Individual Time Calculation (min): 40 min  Short Term Goals: Week 1: SLP Short Term Goal 1 (Week 1): STG=LTG due to ELOS  Skilled Therapeutic Interventions: Skilled treatment session focused on dysphagia and cognitive goals. Upon arrival, patient was awake in the enclosure bed. Per nursing, patient did not sleep the previous night due to restlessness. Patient had removed his brief and was incontinent of urine. Patient donned a new brief and pants with Mod-Max verbal cues needed for safety with task. Patient consumed breakfast meal of Dys. 3 textures with honey-thick liquids via tsp with Max A verbal and visual cues needed for safety and attention to task due to impulsivity and consistently trying to stand and ambulate after each bite. Patient with 1 large coughing episode throughout meal, suspect due to poor attention to task. Recommend patient continue current diet with full supervision. Patient ambulated around the room with CGA and assisted in setting up his suction with overall Mod verbal cues needed for problem solving. Patient became lethargic at end of session and agreeable to get back into enclosure bed to rest. Patient left with all needs within reach and bed secured. Continue with current plan of care.      Pain No/Denies Pain   Therapy/Group: Individual Therapy  Abigaile Rossie 07/28/2022, 3:14 PM

## 2022-07-28 NOTE — Telephone Encounter (Signed)
   Cardiac Monitor Alert  Date of alert:  07/28/2022   Patient Name: Douglas Cox  DOB: 01-Apr-1951  MRN: [id number]   [facility] Cardiologist: None  [facility] EP:  Tommye Standard, PA-C    Monitor Information: Long Term Monitor-Live Telemetry [ZioAT]  Reason:  Cerebral Infarction Ordering provider:  Tommye Standard, PA-C   Alert Atrial Fibrillation/Flutter This is the 2nd alert for this rhythm.  The patient has a hx of Atrial Fibrillation/Flutter.     Next Cardiology Appointment   Date:  09/10/2022  Provider:  Dr. Grayce Sessions   The patient could NOT be reached by telephone today.  Pt currently at Inpatient Rehab Copy of report will be sent to Tommye Standard, PA-C.  Other: Report received, 07/28/2022 at 0815 am. Report faxed to [address] on 07/24/22 at 1021 am.  Pt had episode of rapid Atrial Flutter at captured on 07/23/22 at 832 pm.  [employer] contacted Almyra Deforest, PA-C on 07/24/2022 at 1012 am.   This was the patients second episode captured on the monitor.  Monitor report taken to Dr. Lendell Caprice DOD at [facility] street for review.  Dr. Irish Lack made aware of notification to Mr. Eulah Citizen, and second episode captured.  Dr. Irish Lack also made aware of pt currently admitted to Inpatient Rehab.  Orders to send copy of report to R. Charlcie Cradle, PA-C.    Varney Daily, RN  07/28/2022 9:52 AM

## 2022-07-28 NOTE — Progress Notes (Signed)
PROGRESS NOTE   Subjective/Complaints:   ROS: Unable to obtain due to mental status  Objective:   No results found. No results for input(s): "WBC", "HGB", "HCT", "PLT" in the last 72 hours.  No results for input(s): "NA", "K", "CL", "CO2", "GLUCOSE", "BUN", "CREATININE", "CALCIUM" in the last 72 hours.   Intake/Output Summary (Last 24 hours) at 07/28/2022 0745 Last data filed at 07/27/2022 2000 Gross per 24 hour  Intake 135 ml  Output --  Net 135 ml         Physical Exam: Vital Signs Blood pressure (!) 143/68, pulse (!) 42, temperature 97.8 F (36.6 C), temperature source Oral, resp. rate 16, SpO2 98 %. Gen: no distress, normal appearing, BMI 22.55  General: No acute distress Mood and affect are appropriate Heart: Regular rate and rhythm no rubs murmurs or extra sounds Lungs: Clear to auscultation, breathing unlabored, no rales or wheezes  Neuro:somnolent but awakens to voice, follows simple commands with hand grasp bilaterally and wiggles toes bilaterally  Could not assess sensation due to somnolence, did not answer questions this am    Assessment/Plan: 1. Functional deficits which require 3+ hours per day of interdisciplinary therapy in a comprehensive inpatient rehab setting. Physiatrist is providing close team supervision and 24 hour management of active medical problems listed below. Physiatrist and rehab team continue to assess barriers to discharge/monitor patient progress toward functional and medical goals  Care Tool:  Bathing    Body parts bathed by patient: Right arm, Left arm, Chest, Abdomen, Front perineal area, Right upper leg, Buttocks, Left upper leg, Right lower leg, Left lower leg, Face         Bathing assist Assist Level: Minimal Assistance - Patient > 75%     Upper Body Dressing/Undressing Upper body dressing   What is the patient wearing?: Pull over shirt    Upper body assist  Assist Level: Contact Guard/Touching assist    Lower Body Dressing/Undressing Lower body dressing      What is the patient wearing?: Pants     Lower body assist Assist for lower body dressing: Contact Guard/Touching assist     Toileting Toileting    Toileting assist Assist for toileting: Minimal Assistance - Patient > 75%     Transfers Chair/bed transfer  Transfers assist     Chair/bed transfer assist level: Minimal Assistance - Patient > 75%     Locomotion Ambulation   Ambulation assist      Assist level: Minimal Assistance - Patient > 75% Assistive device: No Device Max distance: 50   Walk 10 feet activity   Assist     Assist level: Minimal Assistance - Patient > 75% Assistive device: No Device   Walk 50 feet activity   Assist    Assist level: Minimal Assistance - Patient > 75% Assistive device: No Device    Walk 150 feet activity   Assist Walk 150 feet activity did not occur: Safety/medical concerns    Assistive device: No Device    Walk 10 feet on uneven surface  activity   Assist     Assist level: Minimal Assistance - Patient > 75% Assistive device: Other (comment) (no device)  Wheelchair     Assist Is the patient using a wheelchair?: Yes Type of Wheelchair: Manual    Wheelchair assist level: Minimal Assistance - Patient > 75% Max wheelchair distance: 20    Wheelchair 50 feet with 2 turns activity    Assist        Assist Level: Moderate Assistance - Patient 50 - 74%   Wheelchair 150 feet activity     Assist      Assist Level: Total Assistance - Patient < 25%   Blood pressure (!) 143/68, pulse (!) 42, temperature 97.8 F (36.6 C), temperature source Oral, resp. rate 16, SpO2 98 %.  Medical Problem List and Plan: 1. 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  Plan for loop  recorder             -patient may shower             -ELOS/Goals: 08/03/22 days             Continue CIR PT/OT/SLP             -Needs wearable heart rhythm monitor per cardiology  2.  Atrial fibrillation: eliquis started and aspirin d/ced. EKG ordered 12/23 and showed sinus tachycardia, now bradycardic, discussed with wife 3. Pain Management: Tylenol as needed 4. Impulsivity: telesitter ordered. Enclosure bed ordered.              -antipsychotic agents: continue Seroquel 12.5 mg twice daily 5. Neuropsych/cognition: This patient is not capable of making decisions on his own behalf. 6. Skin/Wound Care: Routine skin checks 7. Fluids/Electrolytes/Nutrition: Routine in and outs with follow-up chemistries 8.  Dysphagia.  Diet advanced to mechanical soft honey thick liquids by spoon.  Awaiting plan to continue nasogastric tube for nutritional support and hydration.  Follow-up speech therapy             -d/c IVF Eating 25% meals- recorded fluid <242m per day, give slorate bolus today , in net bed so overnite IVF not as feasible- add megace 9.  Hx of COVID:  Airborne/contact precautions d/ced. Discussed loss of taste may be related to COVID 10.  Hyperlipidemia.  continue Crestor 242m11. DM 2? HGB A1C 5.7. CBGs have not been elevated. 12. HTN. Controlled off meds 13. Hypocalcemia: 1 tablet tums started daily 14. Suboptimal vitamin D: home supplement restarted 15. B12 deficiency: B12 injection ordered 12/23, receives q2weeks outpatient 16. Bradycardia:  Vitals:   07/27/22 2004 07/28/22 0404  BP: 136/66 (!) 143/68  Pulse: (!) 45 (!) 42  Resp: 16 16  Temp: 98.3 F (36.8 C) 97.8 F (36.6 C)  SpO2: 96% 98%   EKG 12/22 with sinus brady unchanged vs Aug 2022, not on negative chronotropic meds    LOS: 6 days A FACE TO FACE EVALUATION WAS PERFORMED  AnCharlett Blake2/27/2023, 7:45 AM

## 2022-07-29 DIAGNOSIS — L219 Seborrheic dermatitis, unspecified: Secondary | ICD-10-CM

## 2022-07-29 DIAGNOSIS — R7989 Other specified abnormal findings of blood chemistry: Secondary | ICD-10-CM

## 2022-07-29 LAB — BASIC METABOLIC PANEL
Anion gap: 7 (ref 5–15)
BUN: 23 mg/dL (ref 8–23)
CO2: 23 mmol/L (ref 22–32)
Calcium: 8.6 mg/dL — ABNORMAL LOW (ref 8.9–10.3)
Chloride: 111 mmol/L (ref 98–111)
Creatinine, Ser: 1.07 mg/dL (ref 0.61–1.24)
GFR, Estimated: >60 mL/min (ref >60)
Glucose, Bld: 94 mg/dL (ref 70–99)
Potassium: 3.3 mmol/L — ABNORMAL LOW (ref 3.5–5.1)
Sodium: 141 mmol/L (ref 135–145)

## 2022-07-29 NOTE — Progress Notes (Signed)
Occupational Therapy Session Note  Patient Details  Name: Douglas Cox MRN: [id number] Date of Birth: Jun 08, 1951  Today's Date: 07/29/2022 OT Individual Time: 1430-1500 OT Individual Time Calculation (min): 30 min    Short Term Goals: Week 1:  OT Short Term Goal 1 (Week 1): STG= LTG d/t ELOS   Skilled Therapeutic Interventions/Progress Updates:    1:1 Pt finishing meds with RN when arrived. Pt guided out of bed by taking his hand and guiding him up and to the sink. Pt performed oral care with supervision with min cues and environmental setup as prompts. Pt required cue to spit; did brush teeth for an extended amt of time. Pt then guided with hand held assistance to quiet private office to address cognition (sustained attention, following directions (written and oral and problem solving). Pt able to perform task of following directions 5 step on paper pen task- asking appropriate questions only requiring min cuing to keep place. Transitioned to map way finding task (one to two step directions ) with more breakdown requiring max A - demonstrating more difficulty with holding onto information and then searching for answer/ problem solving. Wife present and observed and discussed transition to home and how an established routine would help and only to allow his hobby tasks with assistance. Escorted back to room and left resting in bed with wife present.   Therapy Documentation Precautions:  Precautions Precautions: Fall Restrictions Weight Bearing Restrictions: No  Pain:  No pain indicated   Therapy/Group: Individual Therapy  Willeen Cass [facility] 07/29/2022, 4:06 PM

## 2022-07-29 NOTE — Progress Notes (Signed)
Patient ID: Douglas Cox, male   DOB: [birth year], 71 y.o.   MRN: [id number]  Met with pt and wife to discuss OP therapies and which facility is closest to them. [facility] reports [facility] and informed will fax order and they will call her to set up appointment. No equipment needs. Also gave wife her forms for work that were completed by PA. Pt continues to do well in therapies. Wife is pleased with this

## 2022-07-29 NOTE — Progress Notes (Signed)
Physical Therapy Session Note  Patient Details  Name: Douglas Cox MRN: [id number] Date of Birth: 1950-12-07  Today's Date: 07/29/2022 PT Individual Time: 0815-0915 PT Individual Time Calculation (min): 60 min  Short Term Goals: Week 1:  PT Short Term Goal 1 (Week 1): STG = LTG 2/2 ELOS  Skilled Therapeutic Interventions/Progress Updates:     Patient sitting EOB eating breakfast, handed off from NT for full supervision, upon PT arrival. Patient alert and agreeable to PT session. Patient denied pain during session.  Patient with minimal verbalization throughout session, but does respond with short phrases to questions. Patient was bright and alert x30 min of session, but quickly became fatigued and fell asleep upon his wife's arrival. Continued session with family education on management of cognitive deficits and safety concerns at home.   Patient ate >75% of breakfast, leaving only 1/2 a banana on his tray. Did not want to drink the thickened orange juice or alternative thickened liquids at this time.   Therapeutic Activity: Patient donned pants with set-up assist at beginning of session.  Transfers: Patient performed stand pivot bed>recliner and sit to/from stand x6 with supervision without an AD. Provided verbal cues for impulse control without signs of impulsivity throughout session.  Gait Training:  Patient ambulated >150 feet x2 without an AD with supervision. Ambulated with decreased gait speed, decreased step length and height, reduced arm swing, reduced visual scanning, mild forward trunk lean, and intermittent downward head gaze. Provided verbal cues for erect posture, visual targets to improve visual scanning with response to cues <50% of the time, and increased gait speed and arm swing for improved balance without an AD.  Patient ascended/descended 12x6" steps using 1 rail with close supervision reciprocal gait pattern throughout. Required max cues for using only 1 rail  with B rails available.  Neuromuscular Re-ed: Patient performed the following dynamic gait activities with close supervision for reduced fall risk with functional gait: -backwards walking 2x20 ftt -walking with horizontal and vertical head turns 2x20 feet each -changing gait speed (slow/fast/normal) 2x20 feet -stepping over objects (2 yoga blocks, unable to follow cues to not use circumduction with 2, able to perform over 1 block) -forward walking with eyes closed 2x20 feet with cues for increased gait speed -tandem walking on green line x2 with min A x3 for LOB -walking with sudden que to turn 180 deg and stop x2 with good balance control  Patient in recliner asleep with his wife in the room at end of session with breaks locked, no alarm set as his wife is cleared for transfers in the room, and all needs within reach.   Therapy Documentation Precautions:  Precautions Precautions: Fall Restrictions Weight Bearing Restrictions: No    Therapy/Group: Individual Therapy  Klara Stjames L Mckynlee Luse PT, DPT, NCS, CBIS  07/29/2022, 6:26 AM

## 2022-07-29 NOTE — Progress Notes (Signed)
Occupational Therapy Session Note  Patient Details  Name: Douglas Cox MRN: [id number] Date of Birth: Apr 06, 1951  Today's Date: 07/29/2022 OT Individual Time: 1300-1345 OT Individual Time Calculation (min): 45 min    Short Term Goals: Week 1:  OT Short Term Goal 1 (Week 1): STG= LTG d/t ELOS  Skilled Therapeutic Interventions/Progress Updates:    Pt received in bed with no pain Therapeutic activity Functional mobility with supervision directing pt attention away from alarm doors when passing in hallway to dayroom  Cognitive activities completed for sorting/naming tasks -food items in field of 4 (30% accuracy with improvement with moving other already sorted options out of field of view) -cards into colors 100% accuracy -ping pong ball colors field of 4 30% accuracy trying to follow picture.   Education with wife about ways to grade activities up and down with deck of cards to challenge cognition using sorting, patterns, memory (eventually) and using environment to challenge or improve attention. Wife appreciative.   Pt left at end of session in bed with exit alarm on, call light in reach and all needs met   Therapy Documentation Precautions:  Precautions Precautions: Fall Restrictions Weight Bearing Restrictions: No General:     Therapy/Group: Individual Therapy  Tonny Branch 07/29/2022, 6:54 AM

## 2022-07-29 NOTE — Progress Notes (Signed)
Physical Therapy Session Note  Patient Details  Name: Douglas Cox MRN: [id number] Date of Birth: [birth year]  Today's Date: 07/29/2022 PT Individual Time: 0950-1058 PT Individual Time Calculation (min): 68 min   Short Term Goals: Week 1:  PT Short Term Goal 1 (Week 1): STG = LTG 2/2 ELOS  Skilled Therapeutic Interventions/Progress Updates: Pt presented in recliner with wife and MD present. Pt appearing very restless one moment then sleeping another. Pt then attempted to get out of recliner while lower half still extended. Pt was able to be redirected back sitting and legs lowered. Pt then ambulated to bathroom with supervision and had continent urinary void in standing with pt able to manage brief and clothing. Pt returned to bed with pt eventually being able to be encouraged to return to recliner. Pt's wife had queries regarding DME for home (shower chair), and if d/c could be moved earlier. Adv as not primary therapist did not have all info but passed on shower chair question to OT, and advised also that family may be wishing to up d/c date, wife appreciative. While speaking with wife pt was able to participate in some seated and standing therex for stand balance, endurance, and BLE strengthening, including LAQ, standing marches, abd/add, and standing hamstring curls with 2ln cuffs. Pt was able to follow commands ~75% of time and was able to complete task ~75% of time. Pt also participated in seated ball taps with 4lb dowel as well as standing rows with 4lb dowel and yellow theraband. Pt eventually crawling back into bed and PTA providing additional info to wife re: stroke support group and home set up. Pt left sleeping soundly in bed at end of session with wife present and current needs met.      Therapy Documentation Precautions:  Precautions Precautions: Fall Restrictions Weight Bearing Restrictions: No General:   Vital Signs:  Pain:   Mobility:   Locomotion :     Trunk/Postural Assessment :    Balance:   Exercises:   Other Treatments:      Therapy/Group: Individual Therapy  Rilee Wendling 07/29/2022, 4:07 PM

## 2022-07-29 NOTE — Plan of Care (Signed)
Behavioral Plan     Behavior to decrease/ eliminate:    Restlessness and agitation;  increase participation in functional tasks and take meds    Changes to environment:  -Lights on, blinds open during the day; off and closed at night -Enclosure bed for night time  -telesitter d/ced  -suction to be hooked up in room for oral care   Interventions: -Wife can ambulate pt around the room and the unit (4 [place] only)  -Wife can assist with meals and liquids (Dys 3, honey thick via tea spoon) -Enclosure bed can remain open if wife is present  Recommendations for interactions with patient: - be direct with requests, -can lead patient but taking his hand to guide him up  -sleep wake chart to be filled out -give time to respond; can be very soft spoken ask to repeat if needed  Attendees:  Felicity Bing

## 2022-07-29 NOTE — Progress Notes (Signed)
PROGRESS NOTE   Subjective/Complaints: No new concerns elicited. He reports he wants to go home soon.   ROS: Unable to obtain due to mental status  Objective:   No results found. No results for input(s): "WBC", "HGB", "HCT", "PLT" in the last 72 hours.  Recent Labs    07/29/22 0556  NA 141  K 3.3*  CL 111  CO2 23  GLUCOSE 94  BUN 23  CREATININE 1.07  CALCIUM 8.6*     Intake/Output Summary (Last 24 hours) at 07/29/2022 1338 Last data filed at 07/29/2022 0830 Gross per 24 hour  Intake 120 ml  Output --  Net 120 ml         Physical Exam: Vital Signs Blood pressure 128/73, pulse (!) 48, temperature 98.7 F (37.1 C), resp. rate 18, SpO2 100 %. Gen: no distress, normal appearing, BMI 22.55  General: No acute distress, in posey bed Flat affect Heart: Regular rate and rhythm no rubs murmurs or extra sounds Lungs: Clear to auscultation, breathing unlabored, no rales or wheezes Skin: small are of red scaly rash noted on bead Neuro:Alert and awake, follows simple commands with hand grasp bilaterally and wiggles toes bilaterally  Walking with therapy.    Assessment/Plan: 1. Functional deficits which require 3+ hours per day of interdisciplinary therapy in a comprehensive inpatient rehab setting. Physiatrist is providing close team supervision and 24 hour management of active medical problems listed below. Physiatrist and rehab team continue to assess barriers to discharge/monitor patient progress toward functional and medical goals  Care Tool:  Bathing    Body parts bathed by patient: Right arm, Left arm, Chest, Abdomen, Front perineal area, Right upper leg, Buttocks, Left upper leg, Right lower leg, Left lower leg, Face         Bathing assist Assist Level: Minimal Assistance - Patient > 75%     Upper Body Dressing/Undressing Upper body dressing   What is the patient wearing?: Pull over shirt     Upper body assist Assist Level: Contact Guard/Touching assist    Lower Body Dressing/Undressing Lower body dressing      What is the patient wearing?: Pants     Lower body assist Assist for lower body dressing: Contact Guard/Touching assist     Toileting Toileting    Toileting assist Assist for toileting: Minimal Assistance - Patient > 75%     Transfers Chair/bed transfer  Transfers assist     Chair/bed transfer assist level: Minimal Assistance - Patient > 75%     Locomotion Ambulation   Ambulation assist      Assist level: Minimal Assistance - Patient > 75% Assistive device: No Device Max distance: 50   Walk 10 feet activity   Assist     Assist level: Minimal Assistance - Patient > 75% Assistive device: No Device   Walk 50 feet activity   Assist    Assist level: Minimal Assistance - Patient > 75% Assistive device: No Device    Walk 150 feet activity   Assist Walk 150 feet activity did not occur: Safety/medical concerns    Assistive device: No Device    Walk 10 feet on uneven surface  activity  Assist     Assist level: Minimal Assistance - Patient > 75% Assistive device: Other (comment) (no device)   Wheelchair     Assist Is the patient using a wheelchair?: Yes Type of Wheelchair: Manual    Wheelchair assist level: Minimal Assistance - Patient > 75% Max wheelchair distance: 20    Wheelchair 50 feet with 2 turns activity    Assist        Assist Level: Moderate Assistance - Patient 50 - 74%   Wheelchair 150 feet activity     Assist      Assist Level: Total Assistance - Patient < 25%   Blood pressure 128/73, pulse (!) 48, temperature 98.7 F (37.1 C), resp. rate 18, SpO2 100 %.  Medical Problem List and Plan: 1. 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  Plan for loop recorder              -patient may shower             -ELOS/Goals: 08/03/22 days             Continue CIR PT/OT/SLP             -Needs wearable heart rhythm monitor per cardiology  -Continue posey bed, wife reports pt will attempt to exit the building   2.  Atrial fibrillation: eliquis started and aspirin d/ced. EKG ordered 12/23 and showed sinus tachycardia, now bradycardic, discussed with wife 3. Pain Management: Tylenol as needed 4. Impulsivity: telesitter ordered. Enclosure bed ordered.              -antipsychotic agents: continue Seroquel 12.5 mg twice daily 5. Neuropsych/cognition: This patient is not capable of making decisions on his own behalf. 6. Skin/Wound Care: Routine skin checks 7. Fluids/Electrolytes/Nutrition: Routine in and outs with follow-up chemistries -12/28 Cr stable at 1.07, BUN a little up to 23, continue to monitor 8.  Dysphagia.  Diet advanced to mechanical soft honey thick liquids by spoon.  Awaiting plan to continue nasogastric tube for nutritional support and hydration.  Follow-up speech therapy             -d/c IVF Eating 25% meals- recorded fluid <226m per day, give slorate bolus today , in net bed so overnite IVF not as feasible- add megace  -ate 75% of breakfast today 9.  Hx of COVID:  Airborne/contact precautions d/ced. Discussed loss of taste may be related to COVID 10.  Hyperlipidemia.  continue Crestor 26m11. DM 2? HGB A1C 5.7. CBGs have not been elevated. 12. HTN. Controlled off meds  -BP well controlled    07/29/2022    5:58 AM 07/28/2022   11:50 PM 07/28/2022    4:00 PM  Vitals with BMI  Systolic [id number] 73 68 77  Pulse 48 50 75    13. Hypocalcemia: 1 tablet tums started daily 14. Suboptimal vitamin D: home supplement restarted 15. B12 deficiency: B12 injection ordered 12/23, receives q2weeks outpatient 16. Bradycardia:  Vitals:   07/28/22 2350 07/29/22 0558  BP: 128/68 128/73  Pulse: (!) 50 (!) 48  Resp: 18 18  Temp: 98.2 F  (36.8 C) 98.7 F (37.1 C)  SpO2:  100%   EKG 12/22 with sinus brady unchanged vs Aug 2022, not on negative chronotropic meds  17. Rash of face. Appears consistent with a seborrheic dermatitis. Pt uses ketoconazole cream ordered by dermatology for his  scalp, would continue for his face  LOS: 7 days A FACE TO FACE EVALUATION WAS PERFORMED  Jennye Boroughs 07/29/2022, 1:38 PM

## 2022-07-30 ENCOUNTER — Inpatient Hospital Stay (HOSPITAL_COMMUNITY): Payer: Medicare Other

## 2022-07-30 DIAGNOSIS — E876 Hypokalemia: Secondary | ICD-10-CM

## 2022-07-30 LAB — BASIC METABOLIC PANEL
Anion gap: 13 (ref 5–15)
BUN: 26 mg/dL — ABNORMAL HIGH (ref 8–23)
CO2: 19 mmol/L — ABNORMAL LOW (ref 22–32)
Calcium: 8.9 mg/dL (ref 8.9–10.3)
Chloride: 109 mmol/L (ref 98–111)
Creatinine, Ser: 0.99 mg/dL (ref 0.61–1.24)
GFR, Estimated: >60 mL/min (ref >60)
Glucose, Bld: 158 mg/dL — ABNORMAL HIGH (ref 70–99)
Potassium: 3.8 mmol/L (ref 3.5–5.1)
Sodium: 141 mmol/L (ref 135–145)

## 2022-07-30 MED ORDER — POLYETHYLENE GLYCOL 3350 17 G PO PACK
17.0000 g | PACK | Freq: Every day | ORAL | Status: DC
  Administered 2022-07-30 – 2022-08-03 (×5): 17 g via ORAL
  Filled 2022-07-30 (×5): qty 1

## 2022-07-30 MED ORDER — SODIUM CHLORIDE 0.9 % IV BOLUS: 500.0000 mL | Freq: Once | INTRAVENOUS | Status: DC

## 2022-07-30 MED ORDER — POTASSIUM CHLORIDE CRYS ER 20 MEQ PO TBCR
30.0000 meq | EXTENDED_RELEASE_TABLET | Freq: Once | ORAL | Status: AC
  Administered 2022-07-30: 30 meq via ORAL
  Filled 2022-07-30: qty 1

## 2022-07-30 MED ORDER — SODIUM CHLORIDE 0.9 % IV BOLUS
500.0000 mL | Freq: Once | INTRAVENOUS | Status: AC
  Administered 2022-07-30: 500 mL via INTRAVENOUS

## 2022-07-30 NOTE — Progress Notes (Signed)
Patient ID: Douglas Cox, male   DOB: [birth year], 71 y.o.   MRN: [id number]  Covering for primary SW, Auria.   NO DME reccs listed. OP referral faxed to [place].

## 2022-07-30 NOTE — Discharge Summary (Incomplete)
Physician Discharge Summary  Patient ID: Douglas Cox MRN: [id number] DOB/AGE: [birth year] 72 y.o.  Admit date: 07/22/2022 Discharge date: 08/03/2022  Discharge Diagnoses:  Principal Problem:   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 ([place]) Atrial fibrillation Dysphagia History of COVID Hyperlipidemia Hypertension Celiac disease History of retinal detachment  Discharged Condition: Stable  Significant Diagnostic Studies: IR CT Head Ltd  Result Date: 07/29/2022 INDICATION: 71 year old male presenting with left-sided weakness, dysarthria and; NIHSS 15. His past medical history significant for celiac disease retinal detachment and hemorrhoids. Head CT showed a hyperdense right ICA/MCA, hypodense right insular cortex and basal ganglia (ASPECTS 8). He received IV T NK. CT angiogram of the head and neck showed an occlusion of the intracranial right ICA at the level of the fetal right PCA extending into the proximal right A1 and along the entire M1/MCA segment. He was then transferred to our service for mechanical thrombectomy. EXAM: ULTRASOUND-GUIDED VASCULAR ACCESS DIAGNOSTIC CEREBRAL ANGIOGRAM MECHANICAL THROMBECTOMY FLAT PANEL HEAD CT COMPARISON:  CT/CT angiogram of the head and neck July 17, 2022. MEDICATIONS: 2 g of Ancef IV. The antibiotic was administered within 1 hour of the procedure ANESTHESIA/SEDATION: The procedure was performed under general anesthesia. CONTRAST:  40 mL of Omnipaque 300 milligram/mL FLUOROSCOPY: Radiation Exposure Index (as provided by the fluoroscopic device): 829 mGy Kerma COMPLICATIONS: None immediate. TECHNIQUE: Informed written consent was obtained from the patient's wife after a thorough discussion of the procedural risks, benefits and alternatives. All questions were addressed. Maximal Sterile Barrier Technique was utilized including caps, mask, sterile gowns, sterile gloves, sterile drape, hand hygiene and skin antiseptic. A timeout was  performed prior to the initiation of the procedure. The right groin was prepped and draped in the usual sterile fashion. Using a micropuncture kit and the modified Seldinger technique, access was gained to the right common femoral artery and an 8 French sheath was placed. Real-time ultrasound guidance was utilized for vascular access including the acquisition of a permanent ultrasound image documenting patency of the accessed vessel. Under fluoroscopy, a Zoom 88 guide catheter was navigated over a 6 [place] VTK catheter and a 0.035" Terumo Glidewire into the aortic arch. The catheter was placed into the right common carotid artery and then advanced into the right internal carotid artery. The diagnostic catheter was removed. Frontal and lateral angiograms of the head were obtained. FINDINGS: 1. Normal caliber of the right common femoral artery, adequate for vascular access. 2. 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 PROCEDURE: Using biplane roadmap guidance, a zoom 71 aspiration catheter was navigated over Colossus 35 microguidewire into the cavernous segment of the right ICA. The aspiration catheter was then advanced to the level of occlusion and connected to an aspiration pump. Continuous aspiration was performed for 1.5 minutes. The guide catheter was connected to a VacLok syringe. The aspiration catheter was subsequently removed under constant aspiration. The guide catheter was aspirated for debris. Right internal carotid artery angiograms with frontal and lateral views of the head showed complete recanalization of the intracranial right ICA and right MCA vascular tree. And occlusion is noted at the distal right A1/ACA. Using biplane roadmap guidance, a zoom 71 aspiration catheter was navigated over Colossus 35 microguidewire into the cavernous segment of the right ICA. The aspiration catheter was then advanced to the level of occlusion in the distal right  A1/ACA and connected to an aspiration pump. Continuous aspiration was performed for 2 minutes. The guide catheter was connected to  a VacLok syringe. The aspiration catheter was subsequently removed under constant aspiration. The guide catheter was aspirated for debris. Right internal carotid angiograms with magnified frontal lateral views of the head showed complete recanalization of the right ACA vascular tree. Right internal carotid artery angiograms with full view of the head in frontal and lateral projection show no evidence of thromboembolic complication. Flat panel CT of the head was obtained and post processed in a separate workstation with concurrent attending physician supervision. Selected images were sent to PACS. No evidence hemorrhagic complication. The catheter was subsequently withdrawn. Right common femoral artery angiogram was obtained in right anterior oblique view. The puncture is at the level of the common femoral artery. The artery has normal caliber, adequate for closure device. The sheath was exchanged over the wire for a Perclose prostyle which was utilized for access closure. Immediate hemostasis was achieved. IMPRESSION: 1. Successful mechanical thrombectomy for treatment of an intracranial right internal carotid artery occlusion achieving complete recanalization. 2. No thromboembolic or hemorrhagic complication. PLAN: Transfer to ICU for continued post stroke care. Electronically Signed   By: Pedro Earls M.D.   On: 07/29/2022 12:53   DG CHEST PORT 1 VIEW  Result Date: 07/20/2022 CLINICAL DATA:  Aspiration.  COVID positive. EXAM: PORTABLE CHEST 1 VIEW COMPARISON:  July 17, 2022 FINDINGS: The ET tube has been removed in the interval. A feeding tube terminates below today's film. No pneumothorax. The heart is borderline to mildly enlarged. The hila and mediastinum are unchanged. No pulmonary nodules or masses. No suspicious infiltrates. Mild atelectasis at the right  base. No convincing evidence of aspiration. IMPRESSION: 1. Support apparatus as above. 2. Mild atelectasis at the right base. 3. No convincing evidence of aspiration. Electronically Signed   By: Dorise Bullion III M.D.   On: 07/20/2022 11:10   DG Abd Portable 1V  Result Date: 07/19/2022 CLINICAL DATA:  Feeding tube placement. EXAM: PORTABLE ABDOMEN - 1 VIEW COMPARISON:  Abdominal radiograph yesterday. FINDINGS: Tip of the weighted enteric tube is just to the right of midline in the region of the distal stomach. Overlying artifact in place. Nonobstructive upper abdominal bowel gas pattern. IMPRESSION: Tip of the weighted enteric tube projecting over the distal stomach. Electronically Signed   By: Keith Rake M.D.   On: 07/19/2022 12:35   ECHOCARDIOGRAM COMPLETE  Result Date: 07/18/2022    ECHOCARDIOGRAM REPORT   Patient Name:   Douglas Cox Date of Exam: 07/18/2022 Medical Rec #:  [id number]         Height:       70.0 in Accession #:    [id number]        Weight:       156.1 lb Date of Birth:  [birth year]         BSA:          1.879 m Patient Age:    37 years          BP:           113/57 mmHg Patient Gender: M                 HR:           73 bpm. Exam Location:  Inpatient Procedure: 2D Echo, Cardiac Doppler and Color Doppler Indications:    Stroke I63.9  History:        Patient has no prior history of Echocardiogram examinations.  Stroke; Risk Factors:Former Smoker.  Sonographer:    Greer Pickerel Referring Phys: [phone] DENISE A WOLFE  Sonographer Comments: Image acquisition challenging due to uncooperative patient, Image acquisition challenging due to respiratory motion and Image acquisition challenging due to patient body habitus. IMPRESSIONS  1. Left ventricular ejection fraction, by estimation, is 60 to 65%. The left ventricle has normal function. Left ventricular endocardial border not optimally defined to evaluate regional wall motion. Left ventricular diastolic function could  not be evaluated.  2. Right ventricular systolic function is normal. The right ventricular size is normal. Tricuspid regurgitation signal is inadequate for assessing PA pressure.  3. The mitral valve was not well visualized. No evidence of mitral valve regurgitation.  4. The aortic valve was not well visualized. Aortic valve regurgitation is not visualized. No aortic stenosis is present.  5. Limited study stopped early by patient. Comparison(s): No prior Echocardiogram. FINDINGS  Left Ventricle: Left ventricular ejection fraction, by estimation, is 60 to 65%. The left ventricle has normal function. Left ventricular endocardial border not optimally defined to evaluate regional wall motion. The left ventricular internal cavity size was small. There is no left ventricular hypertrophy. Left ventricular diastolic function could not be evaluated. Right Ventricle: The right ventricular size is normal. No increase in right ventricular wall thickness. Right ventricular systolic function is normal. Tricuspid regurgitation signal is inadequate for assessing PA pressure. Left Atrium: Left atrial size was normal in size. Right Atrium: Right atrial size was normal in size. Pericardium: There is no evidence of pericardial effusion. Mitral Valve: The mitral valve was not well visualized. No evidence of mitral valve regurgitation. Tricuspid Valve: The tricuspid valve is grossly normal. Tricuspid valve regurgitation is not demonstrated. Aortic Valve: The aortic valve was not well visualized. Aortic valve regurgitation is not visualized. No aortic stenosis is present. Pulmonic Valve: The pulmonic valve was not well visualized. Pulmonic valve regurgitation is not visualized. Aorta: The aortic root is normal in size and structure. Venous: The inferior vena cava was not well visualized. IAS/Shunts: No atrial level shunt detected by color flow Doppler.  LEFT VENTRICLE PLAX 2D LVOT diam:     2.00 cm LVOT Area:     3.14 cm  LV Volumes  (MOD) LV vol d, MOD A4C: 127.0 ml LV vol s, MOD A4C: 46.5 ml LV SV MOD A4C:     127.0 ml LEFT ATRIUM           Index LA Vol (A4C): 56.5 ml 30.08 ml/m   AORTA Ao Root diam: 3.20 cm MITRAL VALVE MV Area (PHT): 3.00 cm    SHUNTS MV Decel Time: 253 msec    Systemic Diam: 2.00 cm MV E velocity: 81.30 cm/s MV A velocity: 42.30 cm/s MV E/A ratio:  1.92 Rudean Haskell MD Electronically signed by Rudean Haskell MD Signature Date/Time: 07/18/2022/5:58:29 PM    Final    MR BRAIN WO CONTRAST  Result Date: 07/18/2022 CLINICAL DATA:  Stroke, follow-up. EXAM: MRI HEAD WITHOUT CONTRAST TECHNIQUE: Multiplanar, multiecho pulse sequences of the brain and surrounding structures were obtained without intravenous contrast. COMPARISON:  CT head without contrast 07/17/2022 FINDINGS: Brain: The diffusion-weighted images confirm acute/subacute nonhemorrhagic infarct of the right caudate head and lentiform nucleus. An acute nonhemorrhagic infarct is present in the left caudate head and lentiform nucleus spanning the internal capsule. There is more sparing of the inferior left caudate head on the left. A single punctate focus of restricted diffusion is present in the precentral cortex on image 90 of series 5,  the axial diffusion-weighted sequence. This is confirmed on image 56 of series 7. Restricted diffusion is present in the cortex of the right temporal tip. No other significant peripheral cortical infarcts are present. The examination had to be discontinued prior to completion due to patient unable to tolerate further imaging and attempting to take off the head coil. T2 and FLAIR hyperintensities are associated with the areas of acute/subacute infarction. No other significant white matter disease is present. The ventricles are of normal size. No significant extraaxial fluid collection is present. The internal auditory canals are within normal limits. The brainstem and cerebellum are within normal limits. Vascular: Flow  is present in the major intracranial arteries. Skull and upper cervical spine: The craniocervical junction is normal. Upper cervical spine is within normal limits. Marrow signal is unremarkable. Sinuses/Orbits: Fluid is present in the right sphenoid sinus. Mucosal thickening is present the anterior ethmoid air cells and inferior frontal sinus. Left mastoid effusion is present. The globes and orbits are within normal limits. Other: IMPRESSION: 1. Acute/subacute nonhemorrhagic infarct of the right caudate head and lentiform nucleus as noted on the previous CT scan. 2. Acute/subacute nonhemorrhagic infarct of the left caudate head and lentiform nucleus spanning the internal capsule. 3. Acute/subacute nonhemorrhagic infarct of the cortex of the right temporal tip. 4. Single punctate focus of restricted diffusion in the right precentral cortex. 5. Mild sinus disease. The above was relayed via text pager to [place] on 07/18/2022 at 13:20 . Electronically Signed   By: San Morelle M.D.   On: 07/18/2022 13:21   DG Abd 1 View  Result Date: 07/18/2022 CLINICAL DATA:  Tube placement EXAM: ABDOMEN - 1 VIEW COMPARISON:  None Available. FINDINGS: Upper abdomen image demonstrates a NG tube superimposed with the left upper quadrant. Only the upper portion of the abdomen was included. IMPRESSION: NG tube in place. Electronically Signed   By: Sammie Bench M.D.   On: 07/18/2022 09:28   CT ANGIO HEAD NECK W WO CM  Result Date: 07/17/2022 CLINICAL DATA:  Patient initially presented with left hemiplegia. Following revascularization of the right ICA, the patient now has left hemiplegia. EXAM: CT ANGIOGRAPHY HEAD AND NECK TECHNIQUE: Multidetector CT imaging of the head and neck was performed using the standard protocol during bolus administration of intravenous contrast. Multiplanar CT image reconstructions and MIPs were obtained to evaluate the vascular anatomy. Carotid stenosis measurements (when applicable)  are obtained utilizing NASCET criteria, using the distal internal carotid diameter as the denominator. RADIATION DOSE REDUCTION: This exam was performed according to the departmental dose-optimization program which includes automated exposure control, adjustment of the mA and/or kV according to patient size and/or use of iterative reconstruction technique. CONTRAST:  47m OMNIPAQUE IOHEXOL 350 MG/ML SOLN COMPARISON:  Cerebral arteriogram and CTA head and neck of the same day. FINDINGS: CTA NECK FINDINGS Aortic arch: Atherosclerotic changes at the left subclavian artery origin are stable. Right carotid system: Right common carotid artery is again within normal limits. Atherosclerotic calcifications at the right carotid bifurcation and proximal ICA are stable without significant stenosis or change. Left carotid system: Left common carotid artery is within normal limits. Bifurcation is unremarkable. Cervical left ICA is normal. Vertebral arteries: Left vertebral artery is dominant. No significant interval change. Skeleton: Stable appearance of the cervical spine. Other neck: Soft tissues the neck are otherwise unremarkable. Salivary glands are within normal limits. Thyroid is normal. No significant adenopathy is present. No focal mucosal or submucosal lesions are present. Upper chest: Emphysematous changes again  noted. No significant interval change. Review of the MIP images confirms the above findings CTA HEAD FINDINGS Anterior circulation: The right internal carotid artery is reconstituted without focal stenosis or dissection through the ICA terminus. The left internal carotid artery is stable through the ICA terminus. The right M1 segment scratched at the right M1 and A1 segment are now patent. Right M1 segment scratched at the right A1 segment is dominant. Left M1 segment is normal. The MCA bifurcations are now within normal limits bilaterally. Left MCA branch vessels are stable. ACA branch vessels are stable.  Right MCA branch vessels are now revascularized. No distal occlusion is present. Posterior circulation: Vertebral arteries and basilar artery normal. Left posterior cerebral artery originates from the basilar tip. Right posterior cerebral artery is of fetal type. Mild irregularity of distal PCA branch vessels bilaterally is stable. Venous sinuses: The dural sinuses are patent. The straight sinus and deep cerebral veins are intact. Cortical veins are within normal limits. No significant vascular malformation is evident. Anatomic variants: Fetal type right posterior cerebral artery. Review of the MIP images confirms the above findings IMPRESSION: 1. Right internal carotid artery is reconstituted without focal stenosis or dissection through the ICA terminus. 2. Right M1 segment and MCA bifurcations are now patent. 3. Right MCA branch vessels are now revascularized. 4. No new vessel occlusion involving the left anterior circulation. 5. Stable mild distal small vessel disease without significant proximal stenosis, aneurysm, or branch vessel occlusion within the Circle of Willis. 6. Stable atherosclerotic changes at the right carotid bifurcation and proximal ICA without significant stenosis or change. 7.  Aortic Atherosclerosis (ICD10-I70.0). Electronically Signed   By: San Morelle M.D.   On: 07/17/2022 19:25   IR PERCUTANEOUS ART THROMBECTOMY/INFUSION INTRACRANIAL INC DIAG ANGIO  Result Date: 07/17/2022 INDICATION: 71 year old male presenting with left-sided weakness, dysarthria and; NIHSS 15. His past medical history significant for celiac disease retinal detachment and hemorrhoids. Head CT showed a hyperdense right ICA/MCA, hypodense right insular cortex and basal ganglia (ASPECTS 8). He received IV T NK. CT angiogram of the head and neck showed an occlusion of the intracranial right ICA at the level of the fetal right PCA extending into the proximal right A1 and along the entire M1/MCA segment. He was  then transferred to our service for mechanical thrombectomy. EXAM: ULTRASOUND-GUIDED VASCULAR ACCESS DIAGNOSTIC CEREBRAL ANGIOGRAM MECHANICAL THROMBECTOMY FLAT PANEL HEAD CT COMPARISON:  CT/CT angiogram of the head and neck July 17, 2022. MEDICATIONS: 2 g of Ancef IV. The antibiotic was administered within 1 hour of the procedure ANESTHESIA/SEDATION: The procedure was performed under general anesthesia. CONTRAST:  40 mL of Omnipaque 300 milligram/mL FLUOROSCOPY: Radiation Exposure Index (as provided by the fluoroscopic device): 161 mGy Kerma COMPLICATIONS: None immediate. TECHNIQUE: Informed written consent was obtained from the patient's wife after a thorough discussion of the procedural risks, benefits and alternatives. All questions were addressed. Maximal Sterile Barrier Technique was utilized including caps, mask, sterile gowns, sterile gloves, sterile drape, hand hygiene and skin antiseptic. A timeout was performed prior to the initiation of the procedure. The right groin was prepped and draped in the usual sterile fashion. Using a micropuncture kit and the modified Seldinger technique, access was gained to the right common femoral artery and an 8 French sheath was placed. Real-time ultrasound guidance was utilized for vascular access including the acquisition of a permanent ultrasound image documenting patency of the accessed vessel. Under fluoroscopy, a Zoom 88 guide catheter was navigated over a 6 [place] VTK catheter and  a 0.035" Terumo Glidewire into the aortic arch. The catheter was placed into the right common carotid artery and then advanced into the right internal carotid artery. The diagnostic catheter was removed. Frontal and lateral angiograms of the head were obtained. FINDINGS: 1. Normal caliber of the right common femoral artery, adequate for vascular access. 2. 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 PROCEDURE: Using biplane roadmap  guidance, a zoom 71 aspiration catheter was navigated over Colossus 35 microguidewire into the cavernous segment of the right ICA. The aspiration catheter was then advanced to the level of occlusion and connected to an aspiration pump. Continuous aspiration was performed for 1.5 minutes. The guide catheter was connected to a VacLok syringe. The aspiration catheter was subsequently removed under constant aspiration. The guide catheter was aspirated for debris. Right internal carotid artery angiograms with frontal and lateral views of the head showed complete recanalization of the intracranial right ICA and right MCA vascular tree. And occlusion is noted at the distal right A1/ACA. Using biplane roadmap guidance, a zoom 71 aspiration catheter was navigated over Colossus 35 microguidewire into the cavernous segment of the right ICA. The aspiration catheter was then advanced to the level of occlusion in the distal right A1/ACA and connected to an aspiration pump. Continuous aspiration was performed for 2 minutes. The guide catheter was connected to a VacLok syringe. The aspiration catheter was subsequently removed under constant aspiration. The guide catheter was aspirated for debris. Right internal carotid angiograms with magnified frontal lateral views of the head showed complete recanalization of the right ACA vascular tree. Right internal carotid artery angiograms with full view of the head in frontal and lateral projection show no evidence of thromboembolic complication. Flat panel CT of the head was obtained and post processed in a separate workstation with concurrent attending physician supervision. Selected images were sent to PACS. No evidence hemorrhagic complication. The catheter was subsequently withdrawn. Right common femoral artery angiogram was obtained in right anterior oblique view. The puncture is at the level of the common femoral artery. The artery has normal caliber, adequate for closure device. The  sheath was exchanged over the wire for a Perclose prostyle which was utilized for access closure. Immediate hemostasis was achieved. IMPRESSION: 1. Successful mechanical thrombectomy for treatment of an intracranial right internal carotid artery occlusion achieving complete recanalization. 2. No thromboembolic or hemorrhagic complication. PLAN: Transfer to ICU for continued post stroke care. Electronically Signed   By: Pedro Earls M.D.   On: 07/17/2022 18:50   IR US Guide Vasc Access Right  Result Date: 07/17/2022 INDICATION: 71 year old male presenting with left-sided weakness, dysarthria and; NIHSS 15. His past medical history significant for celiac disease retinal detachment and hemorrhoids. Head CT showed a hyperdense right ICA/MCA, hypodense right insular cortex and basal ganglia (ASPECTS 8). He received IV T NK. CT angiogram of the head and neck showed an occlusion of the intracranial right ICA at the level of the fetal right PCA extending into the proximal right A1 and along the entire M1/MCA segment. He was then transferred to our service for mechanical thrombectomy. EXAM: ULTRASOUND-GUIDED VASCULAR ACCESS DIAGNOSTIC CEREBRAL ANGIOGRAM MECHANICAL THROMBECTOMY FLAT PANEL HEAD CT COMPARISON:  CT/CT angiogram of the head and neck July 17, 2022. MEDICATIONS: 2 g of Ancef IV. The antibiotic was administered within 1 hour of the procedure ANESTHESIA/SEDATION: The procedure was performed under general anesthesia. CONTRAST:  40 mL of Omnipaque 300 milligram/mL FLUOROSCOPY: Radiation Exposure  Index (as provided by the fluoroscopic device): 081 mGy Kerma COMPLICATIONS: None immediate. TECHNIQUE: Informed written consent was obtained from the patient's wife after a thorough discussion of the procedural risks, benefits and alternatives. All questions were addressed. Maximal Sterile Barrier Technique was utilized including caps, mask, sterile gowns, sterile gloves, sterile drape, hand hygiene  and skin antiseptic. A timeout was performed prior to the initiation of the procedure. The right groin was prepped and draped in the usual sterile fashion. Using a micropuncture kit and the modified Seldinger technique, access was gained to the right common femoral artery and an 8 French sheath was placed. Real-time ultrasound guidance was utilized for vascular access including the acquisition of a permanent ultrasound image documenting patency of the accessed vessel. Under fluoroscopy, a Zoom 88 guide catheter was navigated over a 6 [place] VTK catheter and a 0.035" Terumo Glidewire into the aortic arch. The catheter was placed into the right common carotid artery and then advanced into the right internal carotid artery. The diagnostic catheter was removed. Frontal and lateral angiograms of the head were obtained. FINDINGS: 1. Normal caliber of the right common femoral artery, adequate for vascular access. 2. 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 PROCEDURE: Using biplane roadmap guidance, a zoom 71 aspiration catheter was navigated over Colossus 35 microguidewire into the cavernous segment of the right ICA. The aspiration catheter was then advanced to the level of occlusion and connected to an aspiration pump. Continuous aspiration was performed for 1.5 minutes. The guide catheter was connected to a VacLok syringe. The aspiration catheter was subsequently removed under constant aspiration. The guide catheter was aspirated for debris. Right internal carotid artery angiograms with frontal and lateral views of the head showed complete recanalization of the intracranial right ICA and right MCA vascular tree. And occlusion is noted at the distal right A1/ACA. Using biplane roadmap guidance, a zoom 71 aspiration catheter was navigated over Colossus 35 microguidewire into the cavernous segment of the right ICA. The aspiration catheter was then advanced to the  level of occlusion in the distal right A1/ACA and connected to an aspiration pump. Continuous aspiration was performed for 2 minutes. The guide catheter was connected to a VacLok syringe. The aspiration catheter was subsequently removed under constant aspiration. The guide catheter was aspirated for debris. Right internal carotid angiograms with magnified frontal lateral views of the head showed complete recanalization of the right ACA vascular tree. Right internal carotid artery angiograms with full view of the head in frontal and lateral projection show no evidence of thromboembolic complication. Flat panel CT of the head was obtained and post processed in a separate workstation with concurrent attending physician supervision. Selected images were sent to PACS. No evidence hemorrhagic complication. The catheter was subsequently withdrawn. Right common femoral artery angiogram was obtained in right anterior oblique view. The puncture is at the level of the common femoral artery. The artery has normal caliber, adequate for closure device. The sheath was exchanged over the wire for a Perclose prostyle which was utilized for access closure. Immediate hemostasis was achieved. IMPRESSION: 1. Successful mechanical thrombectomy for treatment of an intracranial right internal carotid artery occlusion achieving complete recanalization. 2. No thromboembolic or hemorrhagic complication. PLAN: Transfer to ICU for continued post stroke care. Electronically Signed   By: Pedro Earls M.D.   On: 07/17/2022 18:50   CT HEAD WO CONTRAST (5MM)  Result Date: 07/17/2022 CLINICAL DATA:  Status post revascularization  of right ICA occlusion EXAM: CT HEAD WITHOUT CONTRAST TECHNIQUE: Contiguous axial images were obtained from the base of the skull through the vertex without intravenous contrast. RADIATION DOSE REDUCTION: This exam was performed according to the departmental dose-optimization program which includes  automated exposure control, adjustment of the mA and/or kV according to patient size and/or use of iterative reconstruction technique. COMPARISON:  CT head without contrast 07/17/22 at 12:36 p.m. FINDINGS: Brain: No acute hemorrhage is present. The right caudate head is now hypodense. Diffuse hypoattenuation is present within the right lentiform nucleus. Gray-white differentiation in the insular ribbon is improved. Gray-white differentiation at the right temporal tip is improved as well. This may have been artifactual in the past. No new infarcts are present. Mild right-sided edema is present with slight asymmetric effacement of the sulci. No extra-axial hemorrhage is present. Vascular: Hyperdense vessels are present bilaterally following contrast. This is still slightly more prominent on the right. Skull: Calvarium is intact. Right supraorbital scalp soft tissue swelling is present. No underlying fracture is present. Sinuses/Orbits: Mild mucosal thickening is present throughout the ethmoid air cells and frontoethmoidal recess bilaterally. Secretions are present in the right sphenoid sinus. Bilateral lens replacements are noted. Globes and orbits are otherwise unremarkable. IMPRESSION: 1. Evolving right MCA territory infarct appears to be limited to the right basal ganglia. 2. No acute hemorrhage. 3. No expansion of the infarct. 4. Right supraorbital scalp soft tissue swelling without underlying fracture. Electronically Signed   By: San Morelle M.D.   On: 07/17/2022 18:01   DG CHEST PORT 1 VIEW  Result Date: 07/17/2022 CLINICAL DATA:  Intubation. EXAM: PORTABLE CHEST 1 VIEW COMPARISON:  Chest radiograph 03/25/2021 FINDINGS: Endotracheal tube tip projects 6.6 cm above the carina. Heart is normal in size. No focal consolidation, pleural effusion, or pneumothorax. IMPRESSION: Endotracheal tube tip projects 6.6 cm above the carina. No acute cardiopulmonary abnormality. Electronically Signed   By: Ileana Roup M.D.   On: 07/17/2022 16:08   CT C-SPINE NO CHARGE  Result Date: 07/17/2022 CLINICAL DATA:  Fall EXAM: CT CERVICAL SPINE WITHOUT CONTRAST TECHNIQUE: Multidetector CT imaging of the cervical spine was performed without intravenous contrast. Multiplanar CT image reconstructions were also generated. RADIATION DOSE REDUCTION: This exam was performed according to the departmental dose-optimization program which includes automated exposure control, adjustment of the mA and/or kV according to patient size and/or use of iterative reconstruction technique. COMPARISON:  None Available. FINDINGS: Alignment: Facet joints are aligned without dislocation or traumatic listhesis. Dens and lateral masses are aligned. Skull base and vertebrae: No acute fracture. No primary bone lesion or focal pathologic process. Soft tissues and spinal canal: No prevertebral fluid or swelling. No visible canal hematoma. Disc levels: Moderate degenerative disc disease from C4-5 through C7-T1. Upper chest: Mild emphysematous changes at the lung apices. Other: None. IMPRESSION: 1. No acute fracture or traumatic listhesis of the cervical spine. 2. Moderate degenerative disc disease from C4-5 through C7-T1. Electronically Signed   By: Davina Poke D.O.   On: 07/17/2022 13:31   CT ANGIO HEAD NECK W WO CM (CODE STROKE)  Result Date: 07/17/2022 CLINICAL DATA:  Code stroke. Left-sided weakness. Right MCA territory infarct. EXAM: CT ANGIOGRAPHY HEAD AND NECK TECHNIQUE: Multidetector CT imaging of the head and neck was performed using the standard protocol during bolus administration of intravenous contrast. Multiplanar CT image reconstructions and MIPs were obtained to evaluate the vascular anatomy. Carotid stenosis measurements (when applicable) are obtained utilizing NASCET criteria, using the distal internal carotid diameter  as the denominator. RADIATION DOSE REDUCTION: This exam was performed according to the departmental  dose-optimization program which includes automated exposure control, adjustment of the mA and/or kV according to patient size and/or use of iterative reconstruction technique. CONTRAST:  42m OMNIPAQUE IOHEXOL 350 MG/ML SOLN COMPARISON:  CT head without contrast 07/17/2022. FINDINGS: CTA NECK FINDINGS Aortic arch: Distal arch demonstrates mild calcification. Calcifications present the origin left subclavian artery without significant stenosis. The right scratched at the innominate artery and left common carotid artery origins are not included on the study. Right carotid system: Right common carotid artery is within normal limits. Atherosclerotic calcifications are present at the bifurcation and proximal right ICA without a significant stenosis relative to the more distal vessel. Left carotid system: The left common carotid artery is within normal limits. Bifurcation is unremarkable. Cervical left ICA is normal. Vertebral arteries: The left vertebral artery is the dominant vessel. Both vertebral arteries originate from the subclavian arteries without significant stenosis. No significant stenosis is present in either vertebral artery in the neck. Skeleton: Typical degenerative changes are present in the cervical spine most evident C4-5, C5-6 and C6-7. No focal osseous lesions are present. Other neck: Soft tissues the neck are otherwise unremarkable. Salivary glands are within normal limits. Thyroid is normal. No significant adenopathy is present. No focal mucosal or submucosal lesions are present. Upper chest: Centrilobular and paraseptal emphysematous changes are present. No nodule or mass lesion is present. No significant airspace consolidation is present. Thoracic inlet is within normal limits. Review of the MIP images confirms the above findings CTA HEAD FINDINGS Anterior circulation: The left internal carotid artery is within normal limits the skull base to the ICA terminus. The right internal carotid artery is  within normal limits in the petrous and cavernous segments. The artery is occluded at the level of the posterior communicating artery. The proximal scratched at the right M1 segment is occluded. Contrast is present in the right A1 segment which appears to be the dominant vessel to the level of the thrombus. A smaller left A1 segment is present. Anterior communicating artery is present. Both A2 segments fill normally. There is marked attenuation of a posterior right M2 branch which appears to fill, presumably from PL collateral vessels. Other proximal MCA branches are not visualized. Left MCA branch vessels and bilateral ACA branch vessels are within normal limits proximally. There is some distal attenuation of branch vessels. Retrograde Posterior circulation: The left vertebral artery is dominant vessel. PICA origins are visualized and normal. Vertebrobasilar junction and basilar artery are patent. Basilar artery is small. Right posterior cerebral artery is of fetal type. Left posterior cerebral artery originates from the basilar tip. PCA branch vessels are within normal limits proximally. There is some attenuation of distal PCA branch vessels bilaterally. Venous sinuses: The dural sinuses are patent. The straight sinus and deep cerebral veins are intact. Cortical veins are within normal limits. No significant vascular malformation is evident. Anatomic variants: Fetal type right posterior cerebral artery Review of the MIP images confirms the above findings IMPRESSION: 1. 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 2. Marked attenuation of a posterior right M2 branch which appears to fill, presumably from PL collateral vessels. 3. Contrast is present in the right A1 segment which appears to be the dominant vessel to the level of the thrombus. 4. A smaller left A1 segment is present. 5. Both A2 segments fill normally. 6. Mild distal small vessel disease without  other  significant proximal stenosis, aneurysm, or branch vessel occlusion within the Circle of Willis. 7. Atherosclerotic changes at the right carotid bifurcation and proximal right ICA without significant stenosis relative to the more distal vessel. 8. Fetal type right posterior cerebral artery. 9. Aortic Atherosclerosis (ICD10-I70.0) and Emphysema (ICD10-J43.9). Electronically Signed   By: San Morelle M.D.   On: 07/17/2022 13:19   CT HEAD CODE STROKE WO CONTRAST  Result Date: 07/17/2022 CLINICAL DATA:  Code stroke. Neuro deficit, acute, stroke suspected. EXAM: CT HEAD WITHOUT CONTRAST TECHNIQUE: Contiguous axial images were obtained from the base of the skull through the vertex without intravenous contrast. RADIATION DOSE REDUCTION: This exam was performed according to the departmental dose-optimization program which includes automated exposure control, adjustment of the mA and/or kV according to patient size and/or use of iterative reconstruction technique. COMPARISON:  None Available. FINDINGS: Brain: Loss of gray-white differentiation noted in the right insular cortex and lateral aspect of the right lentiform nucleus. Right caudate head is intact. There is some indistinct no CIS of the posterior parietal cortex at the level of the basal ganglia. No super ganglionic cortical abnormalities are present. Loss of gray-white differentiation noted within the right temporal tip. No acute hemorrhage is present. The ventricles are of normal size. No significant extraaxial fluid collection is present. Insert normal brainstem Vascular: A hyperdense right ICA terminus and proximal right M1 segment noted. Skull: Calvarium is intact. No focal lytic or blastic lesions are present. No significant extracranial soft tissue lesion is present. Sinuses/Orbits: Mild mucosal thickening is present within anterior ethmoid air cells and inferior frontal sinuses bilaterally. No fluid levels are present. The paranasal sinuses  and mastoid air cells are otherwise clear. Bilateral lens replacements are noted. Globes and orbits are otherwise unremarkable. Other: None ASPECTS ([place] Stroke Program Early CT Score) - Ganglionic level infarction (caudate, lentiform nuclei, internal capsule, insula, M1-M3 cortex): 4/7 - Supraganglionic infarction (M4-M6 cortex): 3/3 Total score (0-10 with 10 being normal): 7/10 IMPRESSION: 1. Loss of gray-white differentiation in the right insular cortex, lateral aspect of the right lentiform nucleus, and right temporal tip consistent with acute infarct. 2. No acute hemorrhage. 3. Hyperdense right ICA terminus and proximal right M1 segment consistent with acute thrombus. 4. Aspects is 7/10. The above was relayed via text pager to [facility] on 07/17/2022 at 12:51 . Electronically Signed   By: San Morelle M.D.   On: 07/17/2022 12:51    Labs:  Basic Metabolic Panel: Recent Labs  Lab 07/29/22 0556  NA 141  K 3.3*  CL 111  CO2 23  GLUCOSE 94  BUN 23  CREATININE 1.07  CALCIUM 8.6*    CBC: No results for input(s): "WBC", "NEUTROABS", "HGB", "HCT", "MCV", "PLT" in the last 168 hours.  CBG: No results for input(s): "GLUCAP" in the last 168 hours.  Brief HPI:   Douglas Cox is a 71 y.o. right-handed male with history of celiac disease and retinal detachment, quit smoking 16 years ago.  Per chart review lives with spouse independent prior to admission.  Presented 07/17/2022 with acute onset of left-sided weakness as well as right frontal headache.  Cranial CT scan showed loss of gray-white differentiation in the right insular cortex lateral aspect of the right lentiform and right temporal tip consistent with acute infarction.  No acute hemorrhage.  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  Status post TNK as well as recanalization per interventional radiology.  MRI follow-up showed acute/subacute nonhemorrhagic infarct of the  right caudate head  and lentiform nucleus as noted on prior previous CT.  Echocardiogram with ejection fraction of 60 to 65%.  Admission chemistries unremarkable except SARS coronavirus positive.  Currently maintained on aspirin for CVA prophylaxis.  Airborne contact precautions for positive COVID.  Initially n.p.o. with alternative means of nutritional support and diet slowly advanced.  He did pull his nasogastric tube out 12/21.  Placed on Unasyn suspect possible pneumonia latest chest x-ray showed no convincing evidence of aspiration.  Bouts of agitation and restlessness placed on Seroquel.  Cardiology service unable to place loop recorder due to sedation.  Plan for cardiology for a wearable heart rhythm monitor by EP clinic.  Therapy evaluations completed due to patient's decreased mobility left-sided weakness was admitted for a comprehensive rehab program.   Hospital Course: Douglas Cox was admitted to rehab 07/22/2022 for inpatient therapies to consist of PT, ST and OT at least three hours five days a week. Past admission physiatrist, therapy team and rehab RN have worked together to provide customized collaborative inpatient rehab.  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  Plan follow-up outpatient cardiology services.  Hospital course atrial fibrillation currently maintained on Eliquis cardiac rate controlled.  Patient would again follow-up outpatient cardiology services for a wearable heart rhythm monitor.  Dysphagia with diet slowly advanced to a mechanical soft honey thick liquid.  Monitoring hydration.  Crestor ongoing for hyperlipidemia.  Hospital course positive COVID airborne contact precautions discontinued.  Patient remained afebrile.  Blood pressure controlled on no current antihypertensive medications.  Bouts of restlessness agitation requiring enclosure bed maintained on Seroquel and the addition of Provigil  with good results.   Blood pressures were monitored on TID basis and controlled     Rehab course: During patient's stay in rehab weekly team conferences were held to monitor patient's progress, set goals and discuss barriers to discharge. At admission, patient required minimal assist 30 feet rolling walker minimal assist sit to stand  Physical exam.  Blood pressure 118/70 pulse 70 respirations 18 oxygen saturation is 92% room air Constitutional.  No acute distress HEENT Head.  Normocephalic and atraumatic Eyes.  Pupils round and reactive to light no discharge without nystagmus Neck.  Supple nontender no JVD without thyromegaly Cardiac regular rate and rhythm without any extra sounds or murmur heard Abdomen.  Soft nontender positive bowel sounds without rebound Respiratory effort normal no respiratory distress without wheeze Skin.  Warm and dry Neurologic.  Patient is alert states his name and hospital follow simple commands.  Able to name 1 of 3 items.  Was not able to repeat. Difficulty with MMT however appears to be generally 4 -/5 in the right upper extremity, 3-4/5 left upper extremity, 4 - in the right lower extremity 2-3 out of 5 in the left lower extremity.  He/She  has had improvement in activity tolerance, balance, postural control as well as ability to compensate for deficits. He/She has had improvement in functional use RUE/LUE  and RLE/LLE as well as improvement in awareness.  Patient attending therapies.  Ambulates to the bathroom supervision for safety.  Patient was able to follow 75% of commands most of the time and able to complete tasks 75%.  Wife attending full therapies for ongoing agitation.  Patient performed oral care with supervision with minimal cues.  Perform tasks of following directions five-step on paper pen task asking appropriate questions only requiring minimal cues to keep place.  Speech therapy continue to follow for dysphagia and appropriate education with  family.  He was still requiring minimal verbal and visual cues for sustained attention.  Full family teaching completed plan discharge to home with recommendations of 24-hour supervision.       Disposition: Discharge to home    Diet: Dysphagia #3 honey thick liquids  Special Instructions: No driving smoking or alcohol  Medications at discharge. 1.  Eliquis 5 mg p.o. twice daily 2.  Vitamin D 1000 units p.o. daily 3.  Tums tablets 1 tablet daily 4.  Colace 100 mg p.o. twice daily hold for loose stools 5.  Pepcid 20 mg p.o. twice daily 6.  Magnesium gluconate 250 mg p.o. nightly 7.  Provigil 100 mg p.o. daily 8.  MiraLAX daily hold for loose stools 9.  Seroquel 25 mg p.o. 3 times daily as needed agitation 10.  Crestor 20 mg p.o. daily  30-35 minutes were spent completing discharge summary and discharge planning  Discharge Instructions     Ambulatory referral to Neurology   Complete by: As directed    An appointment is requested in approximately: 4 weeks right basal ganglia infarction   Ambulatory referral to Physical Medicine Rehab   Complete by: As directed    Moderate complexity follow-up 1 to 2 weeks right basal ganglia infarction        Follow-up Information     Meredith Staggers, MD Follow up.   Specialty: Physical Medicine and Rehabilitation Why: Office to call for appointment Contact information: [address] [place] [address] [phone]         Vickie Epley, MD Follow up.   Specialties: Cardiology, Radiology Why: Call for appointment Contact information: [place] [address] [phone]                 Signed: Lavon Paganini [place] 07/30/2022, 5:30 AM

## 2022-07-30 NOTE — Progress Notes (Signed)
Modified Barium Swallow Progress Note  Patient Details  Name: Douglas Cox MRN: [id number] Date of Birth: July 26, 1951  Today's Date: 07/30/2022  Modified Barium Swallow completed.  Full report located under Chart Review in the Imaging Section.  Brief recommendations include the following:  Clinical Impression  Patient demonstrates a moderate oropharyngeal dysphagia that is impacted by patient's current cognitive deficits. Patient's oral phase is characterized by poor awareness of bolus resulting in inconsistent timing of swallowing trigger. With solids, patient needed verbal cues (snapping and counting) to attend and initiate a swallow with all boluses contained in the velleculae. With liquids, patient consistently triggered his swallow at the pyriform sinuses.  This resulted in sensed aspiration of thin liquids via cup.  Patient also with inconsistent amounts of pharyngeal residue that cleared with a 2nd subsequent swallow.  No penetration or aspiration noted with nectar-thick liquids except for 1 episode of flash penetration.  Provided thorough education to the patient's wife regarding patient's current swallowing function and that he is currently at a higher risk of aspiration due to cognitive deficits. She verbalized understanding and agreement with recommendations of continuing Dys. 3 textures but upgrading to nectar-thick liquids via cup with strict aspiration precautions and supervision. This will hopefully improve patient's intake of liquids with improved ability to stay hydrated without IV fluids prior to potential discharge on 08/03/22.   Swallow Evaluation Recommendations       SLP Diet Recommendations: Dysphagia 3 (Mech soft) solids;Nectar thick liquid   Liquid Administration via: Cup;Spoon;No straw   Medication Administration: Crushed with puree   Supervision: Patient able to self feed;Full supervision/cueing for compensatory strategies   Compensations: Slow rate;Small  sips/bites;Minimize environmental distractions;Multiple dry swallows after each bite/sip   Postural Changes: Remain semi-upright after after feeds/meals (Comment);Seated upright at 90 degrees   Oral Care Recommendations: Oral care BID   Other Recommendations: Order thickener from pharmacy;Have oral suction available;Remove water pitcher;Prohibited food (jello, ice cream, thin soups)    [name] 07/30/2022,2:12 PM

## 2022-07-30 NOTE — Progress Notes (Addendum)
PROGRESS NOTE   Subjective/Complaints: Pt in enclosure bed. Drowsy but wakes to voice. No concerns elicited.    ROS: Unable to obtain due to mental status  Objective:   No results found. No results for input(s): "WBC", "HGB", "HCT", "PLT" in the last 72 hours.  Recent Labs    07/29/22 0556 07/30/22 1405  NA 141 141  K 3.3* 3.8  CL 111 109  CO2 23 19*  GLUCOSE 94 158*  BUN 23 26*  CREATININE 1.07 0.99  CALCIUM 8.6* 8.9     Intake/Output Summary (Last 24 hours) at 07/30/2022 0830 Last data filed at 07/30/2022 0810 Gross per 24 hour  Intake 180 ml  Output --  Net 180 ml         Physical Exam: Vital Signs Blood pressure (!) 140/71, pulse (!) 46, temperature 97.8 F (36.6 C), resp. rate 18, SpO2 98 %. Gen: no distress, normal appearing, BMI 22.55  General: No acute distress, in posey bed Flat affect Heart: Regular rate and rhythm no rubs murmurs or extra sounds Lungs: Clear to auscultation, breathing unlabored, no rales or wheezes Skin: small are of red scaly rash noted on bead- a little improved today Neuro:Drowsy but wakes to voice, follows simple commands with hand grasp bilaterally and wiggles toes bilaterally     Assessment/Plan: 1. Functional deficits which require 3+ hours per day of interdisciplinary therapy in a comprehensive inpatient rehab setting. Physiatrist is providing close team supervision and 24 hour management of active medical problems listed below. Physiatrist and rehab team continue to assess barriers to discharge/monitor patient progress toward functional and medical goals  Care Tool:  Bathing    Body parts bathed by patient: Right arm, Left arm, Chest, Abdomen, Front perineal area, Right upper leg, Buttocks, Left upper leg, Right lower leg, Left lower leg, Face         Bathing assist Assist Level: Minimal Assistance - Patient > 75%     Upper Body Dressing/Undressing Upper  body dressing   What is the patient wearing?: Pull over shirt    Upper body assist Assist Level: Contact Guard/Touching assist    Lower Body Dressing/Undressing Lower body dressing      What is the patient wearing?: Pants     Lower body assist Assist for lower body dressing: Contact Guard/Touching assist     Toileting Toileting    Toileting assist Assist for toileting: Minimal Assistance - Patient > 75%     Transfers Chair/bed transfer  Transfers assist     Chair/bed transfer assist level: Supervision/Verbal cueing     Locomotion Ambulation   Ambulation assist      Assist level: Minimal Assistance - Patient > 75% Assistive device: No Device Max distance: 50   Walk 10 feet activity   Assist     Assist level: Minimal Assistance - Patient > 75% Assistive device: No Device   Walk 50 feet activity   Assist    Assist level: Minimal Assistance - Patient > 75% Assistive device: No Device    Walk 150 feet activity   Assist Walk 150 feet activity did not occur: Safety/medical concerns    Assistive device: No Device  Walk 10 feet on uneven surface  activity   Assist     Assist level: Minimal Assistance - Patient > 75% Assistive device: Other (comment) (no device)   Wheelchair     Assist Is the patient using a wheelchair?: Yes Type of Wheelchair: Manual    Wheelchair assist level: Minimal Assistance - Patient > 75% Max wheelchair distance: 20    Wheelchair 50 feet with 2 turns activity    Assist        Assist Level: Moderate Assistance - Patient 50 - 74%   Wheelchair 150 feet activity     Assist      Assist Level: Total Assistance - Patient < 25%   Blood pressure (!) 140/71, pulse (!) 46, temperature 97.8 F (36.6 C), resp. rate 18, SpO2 98 %.  Medical Problem List and Plan: 1. 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  Plan for loop recorder             -patient may shower             -ELOS/Goals: 08/03/22 days             Continue CIR PT/OT/SLP             -Needs wearable heart rhythm monitor per cardiology  -Continue enclosure bed  2.  Atrial fibrillation: eliquis started and aspirin d/ced. EKG ordered 12/23 and showed sinus tachycardia, now bradycardic, discussed with wife 3. Pain Management: Tylenol as needed 4. Impulsivity: telesitter ordered. Enclosure bed ordered.              -antipsychotic agents: continue Seroquel 12.5 mg twice daily 5. Neuropsych/cognition: This patient is not capable of making decisions on his own behalf. 6. Skin/Wound Care: Routine skin checks 7. Fluids/Electrolytes/Nutrition: Routine in and outs with follow-up chemistries -12/28 Cr stable at 1.07, BUN a little up to 23, recheck 8.  Dysphagia.  Diet advanced to mechanical soft honey thick liquids by spoon.  Awaiting plan to continue nasogastric tube for nutritional support and hydration.  Follow-up speech therapy             -d/c IVF Eating 25% meals- recorded fluid <236m per day, give slorate bolus today , in net bed so overnite IVF not as feasible- add megace  -12/29 Eating 85 to 100%, appears improved, poor fluid intake, will give 5045mNS bolus, encourage PO intake, he was transitioned to nectar thick fluids-hopefully will help intake   9.  Hx of COVID:  Airborne/contact precautions d/ced. Discussed loss of taste may be related to COVID 10.  Hyperlipidemia.  continue Crestor 2059m1. DM 2? HGB A1C 5.7. CBGs have not been elevated. 12. HTN. Controlled off meds  -controlled overall, continue to monitor    07/30/2022    5:50 AM 07/29/2022    8:20 PM 07/29/2022    4:36 PM  Vitals with BMI  Systolic [id number] 71 77 82  Pulse 46 51 57    13. Hypocalcemia: 1 tablet tums started daily 14. Suboptimal vitamin D: home supplement restarted 15. B12 deficiency: B12 injection  ordered 12/23, receives q2weeks outpatient 16. Bradycardia:  Vitals:   07/29/22 2020 07/30/22 0550  BP: 136/77 (!) 140/71  Pulse: (!) 51 (!) 46  Resp: 15 18  Temp: 98.6 F (37 C) 97.8 F (36.6 C)  SpO2: 98% 98%   EKG 12/22 with sinus brady unchanged  vs Aug 2022, not on negative chronotropic meds  17. Rash of face. Appears consistent with a seborrheic dermatitis. Pt uses ketoconazole cream ordered by dermatology for his scalp, would continue for his face -A little improved 18. Hypokalemia  -38mq KCL was given, K+ up to 3.8  LOS: 8 days A FACE TO FACE EVALUATION WAS PERFORMED  YJennye Boroughs12/29/2023, 8:30 AM

## 2022-07-30 NOTE — Progress Notes (Signed)
Physical Therapy Session Note  Patient Details  Name: Douglas Cox MRN: [id number] Date of Birth: [birth year]  Today's Date: 07/30/2022 PT Individual Time: 0730-0811 PT Individual Time Calculation (min): 41 min   Short Term Goals: Week 1:  PT Short Term Goal 1 (Week 1): STG = LTG 2/2 ELOS  Skilled Therapeutic Interventions/Progress Updates:   Received pt supine in enclosure bed, pt pleasant and agreeable to get OOB as breakfast tray arrived, but with limited vocalizations. Pt performed bed mobility independently x 2 trials throughout session. Pt performed all transfers without AD and supervision throughout session and ambulated in/out of bathroom without AD and supervision and stood to void. However, pt taking pants off completely by stepping out of them and removed brief to stand and void. Provided cues to get brief/pants back on and pt able to step into pants with CGA. Stood at sink to wash hands and brush teeth - requiring cues for sequencing as pt attempting to wash hands with body lotion. Pt then sat in recliner and ate breakfast with supervision - cues to take smaller bites and chew completley prior to taking next bite. Pt returned to bed at end of session and left supine in enclosure bed with all needs within reach and Telesitter in place. NT notified of need to perform oral care prior to upcoming MBS.   Therapy Documentation Precautions:  Precautions Precautions: Fall Restrictions Weight Bearing Restrictions: No  Therapy/Group: Individual Therapy Alfonse Alpers PT, DPT  07/30/2022, 7:16 AM

## 2022-07-30 NOTE — Progress Notes (Signed)
Speech Language Pathology Weekly Progress & Session Note  Patient Details  Name: Douglas Cox MRN: [id number] Date of Birth: 16-May-1951  Beginning of progress report period: July 22, 2022 End of progress report period: July 30, 2022  SLP Individual Time: 0076-2263 SLP Individual Time Calculation (min): 15 min  Short Term Goals: Week 1: SLP Short Term Goal 1 (Week 1): STG=LTG due to ELOS SLP Short Term Goal 1 - Progress (Week 1): Progressing toward goal    New Short Term Goals: Week 2: SLP Short Term Goal 1 (Week 2): STGs=LTGs due to ELOS  Weekly Progress Updates: Patient is progressing towards LTGs this reporting period. Currently, patient continues to demonstrate moderate-severe cognitive impairments impacting orientation, sustained attention, functional problem solving, intellectual awareness, and recall with use of compensatory strategies. Patient also continues to demonstrate a decreased vocal intensity which impacts his overall intelligibility at the phrase and sentence level. Patient participated in an [place] today which showed ongoing moderate oropharyngeal dysphagia impacted by cognitive deficits. However, patient's diet was advanced to nectar-thick liquids via cup with strict aspiration precautions. Patient and family education ongoing. Patient would benefit from continued skilled SLP intervention to maximize his cognitive and swallowing function as well as his functional communication prior to discharge.     Intensity: Minumum of 1-2 x/day, 30 to 90 minutes Frequency: 3 to 5 out of 7 days Duration/Length of Stay: 08/03/22 Treatment/Interventions: Cognitive remediation/compensation;Dysphagia/aspiration precaution training;Environmental controls;Functional tasks;Internal/external aids;Patient/family education;Speech/Language facilitation;Therapeutic Activities;Cueing hierarchy  Skilled Therapeutic Intervention:  Skilled treatment session focused on education with the  patient and his wife regarding results of MBS. SLP utilized video feedback to provide education regarding his current swallowing function, diet recommendations, appropriate textures, and swallowing compensatory strategies. She verbalized understanding of all information. Patient left upright in recliner with wife present. Continue with current plan of care  No/Denies Pain  Individual Treatment     Mar Walmer 07/30/2022, 6:29 AM

## 2022-07-30 NOTE — Progress Notes (Signed)
Physical Therapy Session Note  Patient Details  Name: Douglas Cox MRN: [id number] Date of Birth: Apr 11, 1951  Today's Date: 07/30/2022 PT Individual Time: 6553-7482 PT Individual Time Calculation (min): 59 min   Short Term Goals: Week 1:  PT Short Term Goal 1 (Week 1): STG = LTG 2/2 ELOS   Skilled Therapeutic Interventions/Progress Updates:  Patient supine in bed and asleep on entrance to room. Pt's wife present and apologetic that he is not awake yet. Patient alert and agreeable to PT session.   Patient with no pain complaint at start of session.  Therapeutic Activity: Bed Mobility: Pt performed supine <> sit with IND. No vc reqired. Transfers: Pt performed sit<>stand and stand pivot transfers throughout session with Mod I/ supervision for balance. No cues required as pt able to self correct any slight imbalances.   Gait Training/ NMR:  NMR facilitated during session with focus on gait training while searching for objects in hallway and in day room. Pt improves in following instructions throughout session. Initially pt guided with vc/ tc to pick up beach ball and then place under Christmas tree. After this, pt is able to follow instructions for pulling [place] from door in day room and returning to table, finding candy cane, star,  and snowflake decorations in hallway. Pt able to stop ambulation on verbal only command. Activity for balance and safety, as well as focus, attention, and awareness. NMR performed for improvements in motor control and coordination, balance, sequencing, judgement, and self confidence/ efficacy in performing all aspects of mobility at highest level of independence.   Patient seated upright in recliner with wife in armchair across from him at end of session with brakes locked, no alarm set (wife present and understanding of safety requirements if she leaves), and all needs within reach.   Therapy Documentation Precautions:  Precautions Precautions:  Fall Restrictions Weight Bearing Restrictions: No General:   Vital Signs:  Pain:  No indication or related pain throughout session.   Therapy/Group: Individual Therapy  Alger Simons PT, DPT, CSRS 07/30/2022, 2:36 PM

## 2022-07-30 NOTE — Progress Notes (Addendum)
Occupational Therapy Session Note  Patient Details  Name: Douglas Cox MRN: [id number] Date of Birth: 1950/09/03  Today's Date: 07/30/2022 OT Missed Time: 12 Minutes (session time scheduled for 1045-1200) Missed Time Reason: Patient fatigue;Other (comment) (pt sound asleep and could not wake pt up after several minutes of trying, pt had 4-5 hours of interrupted sleep the night before)   Short Term Goals: Week 1:  OT Short Term Goal 1 (Week 1): STG= LTG d/t ELOS  Skilled Therapeutic Interventions/Progress Updates:    Pt received in enclosure bed sound asleep.  Opened bed and rubbed pt's arms calling to him but he would not wake up.  He had done very well in earlier PT session and with speech therapist for swallow study.  Sleep chart showed he did not sleep as well last night.  RN aware that pt continuing to rest. Bed enclosure closed, telesitter on.   Therapy Documentation Precautions:  Precautions Precautions: Fall Restrictions Weight Bearing Restrictions: No    Vital Signs: Therapy Vitals Temp: 97.8 F (36.6 C) Pulse Rate: (Abnormal) 46 Resp: 18 BP: (Abnormal) 140/71 Patient Position (if appropriate): Lying Oxygen Therapy SpO2: 98 % Pain:  asleep    Therapy/Group: Individual Therapy  [name] 07/30/2022, 8:31 AM

## 2022-07-31 MED ORDER — GABAPENTIN 250 MG/5ML PO SOLN
100.0000 mg | Freq: Three times a day (TID) | ORAL | Status: DC
  Administered 2022-07-31 – 2022-08-01 (×2): 100 mg via ORAL
  Filled 2022-07-31 (×4): qty 2

## 2022-07-31 MED ORDER — DOCUSATE SODIUM 50 MG/5ML PO LIQD
100.0000 mg | Freq: Three times a day (TID) | ORAL | Status: DC
  Administered 2022-07-31 – 2022-08-03 (×8): 100 mg via ORAL
  Filled 2022-07-31 (×9): qty 10

## 2022-07-31 NOTE — Progress Notes (Signed)
PROGRESS NOTE   Subjective/Complaints:  Per pt's wife, when she's here, she doesn't keep pt in enclosure bed- she stays by his side and lets him walk around room-   Today, he's trying to pack up and leave- is insistent he's ready to leave per wife and will not focus on another topic.  Was able to upgrade to nectar thick liquids yesterday- and taking in more liquids .  LBM 2 days ago- usually goes daily, but first time missed since rehab admission.   Wife concerned pt might have figured out how to get out of enclosure bed, but admits might not have closed it correctly this AM for a few minutes.    ROS: limited by cognition Objective:   No results found. No results for input(s): "WBC", "HGB", "HCT", "PLT" in the last 72 hours.  Recent Labs    07/29/22 0556 07/30/22 1405  NA 141 141  K 3.3* 3.8  CL 111 109  CO2 23 19*  GLUCOSE 94 158*  BUN 23 26*  CREATININE 1.07 0.99  CALCIUM 8.6* 8.9     Intake/Output Summary (Last 24 hours) at 07/31/2022 1359 Last data filed at 07/31/2022 0900 Gross per 24 hour  Intake 120 ml  Output --  Net 120 ml        Physical Exam: Vital Signs Blood pressure (!) 145/66, pulse (!) 46, temperature 98.3 F (36.8 C), temperature source Oral, resp. rate 18, SpO2 95 %.    General: awake, alert, confused; perseverative; focused on packing; wife in room; walking around without issue;  NAD HENT: oropharynx a little dry CV: regular rate; no JVD Pulmonary: CTA B/L; no W/R/R- good air movement GI: soft, NT, ND, (+)BS Psychiatric: perseverative on packing- lack of speech noticed Neurological: not talking to me, but packing up like he's leaving-  Skin: small are of red scaly rash noted on bead- a little improved today Neuro:Drowsy but wakes to voice, follows simple commands with hand grasp bilaterally and wiggles toes bilaterally     Assessment/Plan: 1. Functional deficits which require  3+ hours per day of interdisciplinary therapy in a comprehensive inpatient rehab setting. Physiatrist is providing close team supervision and 24 hour management of active medical problems listed below. Physiatrist and rehab team continue to assess barriers to discharge/monitor patient progress toward functional and medical goals  Care Tool:  Bathing    Body parts bathed by patient: Right arm, Left arm, Chest, Abdomen, Front perineal area, Right upper leg, Buttocks, Left upper leg, Right lower leg, Left lower leg, Face         Bathing assist Assist Level: Minimal Assistance - Patient > 75%     Upper Body Dressing/Undressing Upper body dressing   What is the patient wearing?: Pull over shirt    Upper body assist Assist Level: Contact Guard/Touching assist    Lower Body Dressing/Undressing Lower body dressing      What is the patient wearing?: Pants     Lower body assist Assist for lower body dressing: Contact Guard/Touching assist     Toileting Toileting    Toileting assist Assist for toileting: Minimal Assistance - Patient > 75%     Transfers Chair/bed  transfer  Transfers assist     Chair/bed transfer assist level: Supervision/Verbal cueing     Locomotion Ambulation   Ambulation assist      Assist level: Minimal Assistance - Patient > 75% Assistive device: No Device Max distance: 50   Walk 10 feet activity   Assist     Assist level: Minimal Assistance - Patient > 75% Assistive device: No Device   Walk 50 feet activity   Assist    Assist level: Minimal Assistance - Patient > 75% Assistive device: No Device    Walk 150 feet activity   Assist Walk 150 feet activity did not occur: Safety/medical concerns    Assistive device: No Device    Walk 10 feet on uneven surface  activity   Assist     Assist level: Minimal Assistance - Patient > 75% Assistive device: Other (comment) (no device)   Wheelchair     Assist Is the patient  using a wheelchair?: Yes Type of Wheelchair: Manual    Wheelchair assist level: Minimal Assistance - Patient > 75% Max wheelchair distance: 20    Wheelchair 50 feet with 2 turns activity    Assist        Assist Level: Moderate Assistance - Patient 50 - 74%   Wheelchair 150 feet activity     Assist      Assist Level: Total Assistance - Patient < 25%   Blood pressure (!) 145/66, pulse (!) 46, temperature 98.3 F (36.8 C), temperature source Oral, resp. rate 18, SpO2 95 %.  Medical Problem List and Plan: 1. 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  Plan for loop recorder             -patient may shower             -ELOS/Goals: 08/03/22 days             -Needs wearable heart rhythm monitor per cardiology  -Continue enclosure bed  Con't CIR- PT, OT and SLP 2.  Atrial fibrillation: eliquis started and aspirin d/ced. EKG ordered 12/23 and showed sinus tachycardia, now bradycardic, discussed with wife 3. Pain Management: Tylenol as needed 4. Impulsivity: telesitter ordered. Enclosure bed ordered.              -antipsychotic agents: continue Seroquel 12.5 mg twice daily  12/30- will add Gabapentin 100 mg TID 5. Neuropsych/cognition: This patient is not capable of making decisions on his own behalf. 6. Skin/Wound Care: Routine skin checks 7. Fluids/Electrolytes/Nutrition: Routine in and outs with follow-up chemistries -12/28 Cr stable at 1.07, BUN a little up to 23, recheck  12/30- will recheck in AM 8.  Dysphagia.  Diet advanced to mechanical soft honey thick liquids by spoon.  Awaiting plan to continue nasogastric tube for nutritional support and hydration.  Follow-up speech therapy             -d/c IVF Eating 25% meals- recorded fluid <264m per day, give slorate bolus today , in net bed so overnite IVF not as feasible- add megace  -12/29 Eating 85  to 100%, appears improved, poor fluid intake, will give 5035mNS bolus, encourage PO intake, he was transitioned to nectar thick fluids-hopefully will help intake  12/30- will recheck BMP in AM 9.  Hx of COVID:  Airborne/contact precautions d/ced. Discussed loss of taste may be related to COVID 10.  Hyperlipidemia.  continue Crestor 2025m1.  DM 2? HGB A1C 5.7. CBGs have not been elevated. 12. HTN. Controlled off meds  -controlled overall, continue to monitor    07/31/2022    4:29 AM 07/30/2022    9:22 PM 07/30/2022    8:47 PM  Vitals with BMI  Systolic [phone]  Diastolic 66 67 71  Pulse 46 49 50    13. Hypocalcemia: 1 tablet tums started daily 14. Suboptimal vitamin D: home supplement restarted 15. B12 deficiency: B12 injection ordered 12/23, receives q2weeks outpatient 16. Bradycardia:  Vitals:   07/30/22 2122 07/31/22 0429  BP: 131/67 (!) 145/66  Pulse: (!) 49 (!) 46  Resp: 17 18  Temp: 98.7 F (37.1 C) 98.3 F (36.8 C)  SpO2: 96% 95%   EKG 12/22 with sinus brady unchanged vs Aug 2022, not on negative chronotropic meds  12/30- stillb radycardic, but no Sx's/signs of poor energy  17. Rash of face. Appears consistent with a seborrheic dermatitis. Pt uses ketoconazole cream ordered by dermatology for his scalp, would continue for his face -A little improved 18. Hypokalemia  -64mq KCL was given, K+ up to 3.8   I spent a total of 54   minutes on total care today- >50% coordination of care- due to d/w wife about pt's impulsiveness and packing up to leave and perseveration- also spoke to her again after Gabapentin- she likes it better than Seroquel.    If no BM by tomorrow- then will give something to help him go.   LOS: 9 days A FACE TO FACE EVALUATION WAS PERFORMED  Berea Majkowski 07/31/2022, 1:59 PM

## 2022-07-31 NOTE — Progress Notes (Signed)
Speech Language Pathology Daily Session Note  Patient Details  Name: Douglas Cox MRN: [id number] Date of Birth: August 10, 1950  Today's Date: 07/31/2022 SLP Individual Time: 0830-0915 SLP Individual Time Calculation (min): 45 min  Short Term Goals: Week 2: SLP Short Term Goal 1 (Week 2): STGs=LTGs due to ELOS  Skilled Therapeutic Interventions: Pt seen for skilled ST with focus on cognitive communication and swallowing goals, pt in enclosure bed after AM meal. NT reports pt with no difficulty swallowing however was difficult to redirect and return to enclosure bed. SLP facilitating orientation task by providing min-mod A verbal and visual cues to scan environment to increase accuracy and carryover of concepts. Pt able to answer simple biographical questions with apparent accuracy (double checked with wife when she arrived) with patient benefiting from max cues to increase vocal intensity. Pt tolerating 4 oz NTL via cup with 1 episode of immediate cough, likely related to reflux as patient bent over quickly after swallow. Pt required mod cues for small sips and slow rate. Discussed ongoing strategies and recommendations with patient and wife, wife verbalized understanding and excitement with patient able to drink NTL via cup at this time. Cont ST POC.  Pain Pain Assessment Pain Scale: 0-10 Pain Score: 0-No pain  Therapy/Group: Individual Therapy  Dewaine Conger 07/31/2022, 9:15 AM

## 2022-07-31 NOTE — Progress Notes (Signed)
Occupational Therapy Session Note  Patient Details  Name: Douglas Cox MRN: [id number] Date of Birth: September 01, 1950  Today's Date: 07/31/2022 OT Individual Time: 1115-1230 OT Individual Time Calculation (min): 75 min   Short Term Goals: Week 1:  OT Short Term Goal 1 (Week 1): STG= LTG d/t ELOS  Skilled Therapeutic Interventions/Progress Updates:    Attempted to see patient initially at scheduled time and pt was fast asleep snoring in bed. Per spouse, pt had been very restless up around the room and had just fallen asleep. OT unable to wake pt.  OT returned in ~ 30 minutes to find pt more awake and agreeable to therapy. Pt's spouse requested that pt brush his teeth. Pt needed verbal and tactile cues to initiate bed mobility, then walk to the sink with CGA. Pt able to sequence and initiate toothbrushing task without cues. He then ambulated to therapy gym without AD and CGA due to intermittent scissoring and occasional R foot drag. Worked on memory and sequencing using BITS in standing. Pt able to recall up to 4 word sequence with correlating images with moderate cues. He was able to verbally say the words correctly in order, but would then reach to hit the wrong correlating picture. Addressed standing balance standing on foam block, but also incorporated problem solving and fine motor task with pipe tree puzzle. Pt was very attentive to this task as he loves to fix and tinker with things per spouse. Pt only needed minimal cues to locate errors with pipe tree puzzle and completed 3 fairly complex puzzles. Dual task activity of ambulating with ball and saucer to balance, while recalling months of the year forward and backwards (moderate cues for recall backwards.) CGA for ambulation and balance. Pt completed 5 minutes on NuStep on level 4 with focus on maintaining attention to rote task for 5 minutes as well as generalized strength. Pt then able to pathfind back to room without cues and CGA. OT provided  pt with thickened diet ginger ale per pt request. PT left seated in recliner with spouse present to provide supervision for lunch.   Therapy Documentation Precautions:  Precautions Precautions: Fall Restrictions Weight Bearing Restrictions: No Pain: Pain Assessment Pain Scale: 0-10 Pain Score: 0-No pain   Therapy/Group: Individual Therapy  Valma Cava 07/31/2022, 12:44 PM

## 2022-08-01 LAB — BASIC METABOLIC PANEL
Anion gap: 7 (ref 5–15)
BUN: 21 mg/dL (ref 8–23)
CO2: 23 mmol/L (ref 22–32)
Calcium: 8.7 mg/dL — ABNORMAL LOW (ref 8.9–10.3)
Chloride: 110 mmol/L (ref 98–111)
Creatinine, Ser: 1.07 mg/dL (ref 0.61–1.24)
GFR, Estimated: >60 mL/min (ref >60)
Glucose, Bld: 98 mg/dL (ref 70–99)
Potassium: 3.6 mmol/L (ref 3.5–5.1)
Sodium: 140 mmol/L (ref 135–145)

## 2022-08-01 MED ORDER — BISACODYL 10 MG RE SUPP
10.0000 mg | Freq: Every day | RECTAL | Status: DC | PRN
  Filled 2022-08-01: qty 1

## 2022-08-01 MED ORDER — KATE FARMS STANDARD 1.4 PO LIQD
325.0000 mL | Freq: Three times a day (TID) | ORAL | Status: DC
  Administered 2022-08-01 – 2022-08-03 (×4): 325 mL via ORAL
  Filled 2022-08-01 (×9): qty 325

## 2022-08-01 MED ORDER — SORBITOL 70 % SOLN
45.0000 mL | Freq: Every day | Status: DC | PRN
  Administered 2022-08-02: 45 mL via ORAL
  Filled 2022-08-01: qty 60

## 2022-08-01 MED ORDER — MEGESTROL ACETATE 400 MG/10ML PO SUSP
800.0000 mg | Freq: Every day | ORAL | Status: DC
  Administered 2022-08-01 – 2022-08-03 (×3): 800 mg via ORAL
  Filled 2022-08-01 (×3): qty 20

## 2022-08-01 MED ORDER — GABAPENTIN 100 MG PO CAPS
100.0000 mg | ORAL_CAPSULE | Freq: Three times a day (TID) | ORAL | Status: DC
  Administered 2022-08-01 – 2022-08-03 (×2): 100 mg via ORAL
  Filled 2022-08-01 (×4): qty 1

## 2022-08-01 NOTE — Progress Notes (Addendum)
PROGRESS NOTE   Subjective/Complaints:  Per wife, is sleeping soundly- not sure if due to gabapentin or Seroquel.   Per wife, only ate ~ 2 bites for past 4 meals- also, per chart, and nursing, not taking Megace.  Wife also told me as well as nursing, not taking liquid Gabapentin- doesn't like liquids.     ROS: limited by cognition Objective:   No results found. No results for input(s): "WBC", "HGB", "HCT", "PLT" in the last 72 hours.  Recent Labs    07/30/22 1405 08/01/22 0600  NA 141 140  K 3.8 3.6  CL 109 110  CO2 19* 23  GLUCOSE 158* 98  BUN 26* 21  CREATININE 0.99 1.07  CALCIUM 8.9 8.7*     Intake/Output Summary (Last 24 hours) at 08/01/2022 1130 Last data filed at 07/31/2022 1813 Gross per 24 hour  Intake 417 ml  Output --  Net 417 ml        Physical Exam: Vital Signs Blood pressure 131/66, pulse 86, temperature 97.6 F (36.4 C), temperature source Oral, resp. rate 18, SpO2 98 %.     General: sleeping in enclosure bed- woke briefly, but just looked at me; NAD HENT: oropharynx a little dry CV: regular rate; no JVD Pulmonary: CTA B/L; no W/R/R- good air movement GI: soft, NT, ND, (+)BS- slightly hypoactive Psychiatric: drowsy/sleepy Neurological: sleeping in enclosure bed Skin: small are of red scaly rash noted on bead- a little improved today Neuro:Drowsy but wakes to voice, follows simple commands with hand grasp bilaterally and wiggles toes bilaterally     Assessment/Plan: 1. Functional deficits which require 3+ hours per day of interdisciplinary therapy in a comprehensive inpatient rehab setting. Physiatrist is providing close team supervision and 24 hour management of active medical problems listed below. Physiatrist and rehab team continue to assess barriers to discharge/monitor patient progress toward functional and medical goals  Care Tool:  Bathing    Body parts bathed by  patient: Right arm, Left arm, Chest, Abdomen, Front perineal area, Right upper leg, Buttocks, Left upper leg, Right lower leg, Left lower leg, Face         Bathing assist Assist Level: Minimal Assistance - Patient > 75%     Upper Body Dressing/Undressing Upper body dressing   What is the patient wearing?: Pull over shirt    Upper body assist Assist Level: Contact Guard/Touching assist    Lower Body Dressing/Undressing Lower body dressing      What is the patient wearing?: Pants     Lower body assist Assist for lower body dressing: Contact Guard/Touching assist     Toileting Toileting    Toileting assist Assist for toileting: Minimal Assistance - Patient > 75%     Transfers Chair/bed transfer  Transfers assist     Chair/bed transfer assist level: Supervision/Verbal cueing     Locomotion Ambulation   Ambulation assist      Assist level: Minimal Assistance - Patient > 75% Assistive device: No Device Max distance: 50   Walk 10 feet activity   Assist     Assist level: Minimal Assistance - Patient > 75% Assistive device: No Device   Walk 50 feet activity  Assist    Assist level: Minimal Assistance - Patient > 75% Assistive device: No Device    Walk 150 feet activity   Assist Walk 150 feet activity did not occur: Safety/medical concerns    Assistive device: No Device    Walk 10 feet on uneven surface  activity   Assist     Assist level: Minimal Assistance - Patient > 75% Assistive device: Other (comment) (no device)   Wheelchair     Assist Is the patient using a wheelchair?: Yes Type of Wheelchair: Manual    Wheelchair assist level: Minimal Assistance - Patient > 75% Max wheelchair distance: 20    Wheelchair 50 feet with 2 turns activity    Assist        Assist Level: Moderate Assistance - Patient 50 - 74%   Wheelchair 150 feet activity     Assist      Assist Level: Total Assistance - Patient < 25%    Blood pressure 131/66, pulse 86, temperature 97.6 F (36.4 C), temperature source Oral, resp. rate 18, SpO2 98 %.  Medical Problem List and Plan: 1. 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  Plan for loop recorder             -patient may shower             -ELOS/Goals: 08/03/22 days             -Needs wearable heart rhythm monitor per cardiology  -Continue enclosure bed  Con't CIR- PT, OT and SLP 2.  Atrial fibrillation: eliquis started and aspirin d/ced. EKG ordered 12/23 and showed sinus tachycardia, now bradycardic, discussed with wife 3. Pain Management: Tylenol as needed 4. Impulsivity: telesitter ordered. Enclosure bed ordered.              -antipsychotic agents: continue Seroquel 12.5 mg twice daily  12/30- will add Gabapentin 100 mg TID  12/31- changed from liquid to capsule- should be able to be opened- write order for pharmacy to let me know 5. Neuropsych/cognition: This patient is not capable of making decisions on his own behalf. 6. Skin/Wound Care: Routine skin checks 7. Fluids/Electrolytes/Nutrition: Routine in and outs with follow-up chemistries -12/28 Cr stable at 1.07, BUN a little up to 23, recheck  12/30- will recheck in AM  12/31- Cr 1.07 very slightly up but BUN down from 26 to 21- doing better 8.  Dysphagia.  Diet advanced to mechanical soft honey thick liquids by spoon.  Awaiting plan to continue nasogastric tube for nutritional support and hydration.  Follow-up speech therapy             -d/c IVF Eating 25% meals- recorded fluid <266m per day, give slorate bolus today , in net bed so overnite IVF not as feasible- add megace  -12/29 Eating 85 to 100%, appears improved, poor fluid intake, will give 5067mNS bolus, encourage PO intake, he was transitioned to nectar thick fluids-hopefully will help intake  12/30- will recheck BMP in  AM  12/31- hasn't eaten last 4 meals per wife- is lactose and gluten free due to celiac disease- will order Ensure TID and change Megace to 1x/day to see if will take better 9.  Hx of COVID:  Airborne/contact precautions d/ced. Discussed loss of taste may be related to COVID 10.  Hyperlipidemia.  continue Crestor 2050m1. DM 2? HGB A1C 5.7. CBGs have not been  elevated. 12. HTN. Controlled off meds  -controlled overall, continue to monitor    08/01/2022    5:39 AM 07/31/2022    8:34 PM 07/31/2022    2:44 PM  Vitals with BMI  Systolic 390 300 923  Diastolic 66 300 81  Pulse 86 60 72    13. Hypocalcemia: 1 tablet tums started daily 14. Suboptimal vitamin D: home supplement restarted 15. B12 deficiency: B12 injection ordered 12/23, receives q2weeks outpatient 16. Bradycardia:  Vitals:   07/31/22 2034 08/01/22 0539  BP: (!) 143/131 131/66  Pulse: 60 86  Resp: 18 18  Temp: 98.8 F (37.1 C) 97.6 F (36.4 C)  SpO2: 100% 98%   EKG 12/22 with sinus brady unchanged vs Aug 2022, not on negative chronotropic meds  12/30- still bradycardic, but no Sx's/signs of poor energy  17. Rash of face. Appears consistent with a seborrheic dermatitis. Pt uses ketoconazole cream ordered by dermatology for his scalp, would continue for his face -A little improved 18. Hypokalemia  -25mq KCL was given, K+ up to 3.8  12/31- K+ 3.6  I spent a total of 38   minutes on total care today- >50% coordination of care- due to  Discussion with RN about care; not eating; also wife about lack of intake and lack of taking gabapentin due to liquid as well as megace.    Addendum- didn't eat lunch either- found out that LBM was actually 12/27- so most likely very constipated- will order Sorbitol- although I'm concerned he will refuse- if cannot/won't take OR no results, will order Dulcolax supp +/- Soap suds enema to get him cleaned out- wife asking about feeding tube, however I truly think pt will pull it out  immediately after placing and I also think constipation is the cause of the problem.    LOS: 10 days A FACE TO FACE EVALUATION WAS PERFORMED  Errica Dutil 08/01/2022, 11:30 AM

## 2022-08-02 NOTE — Plan of Care (Signed)
  Problem: RH Balance Goal: LTG Patient will maintain dynamic standing with ADLs (OT) Description: LTG:  Patient will maintain dynamic standing balance with assist during activities of daily living (OT)  Outcome: Completed/Met   Problem: RH Bathing Goal: LTG Patient will bathe all body parts with assist levels (OT) Description: LTG: Patient will bathe all body parts with assist levels (OT) Outcome: Completed/Met   Problem: RH Dressing Goal: LTG Patient will perform lower body dressing w/assist (OT) Description: LTG: Patient will perform lower body dressing with assist, with/without cues in positioning using equipment (OT) Outcome: Completed/Met   Problem: RH Toileting Goal: LTG Patient will perform toileting task (3/3 steps) with assistance level (OT) Description: LTG: Patient will perform toileting task (3/3 steps) with assistance level (OT)  Outcome: Completed/Met   Problem: RH Toilet Transfers Goal: LTG Patient will perform toilet transfers w/assist (OT) Description: LTG: Patient will perform toilet transfers with assist, with/without cues using equipment (OT) Outcome: Completed/Met   Problem: RH Tub/Shower Transfers Goal: LTG Patient will perform tub/shower transfers w/assist (OT) Description: LTG: Patient will perform tub/shower transfers with assist, with/without cues using equipment (OT) Outcome: Completed/Met   Problem: RH Attention Goal: LTG Patient will demonstrate this level of attention during functional activites (OT) Description: LTG:  Patient will demonstrate this level of attention during functional activites  (OT) Outcome: Completed/Met   Problem: RH Awareness Goal: LTG: Patient will demonstrate awareness during functional activites type of (OT) Description: LTG: Patient will demonstrate awareness during functional activites type of (OT) Outcome: Completed/Met

## 2022-08-02 NOTE — Progress Notes (Signed)
Speech Language Pathology Discharge Summary  Patient Details  Name: Douglas Cox MRN: [id number] Date of Birth: Jan 03, 1951  Date of Discharge from SLP service:August 02, 2022  Today's Date: 08/02/2022 SLP Individual Time: 4383-8184 SLP Individual Time Calculation (min): 47 min   Skilled Therapeutic Interventions:  Skilled treatment session focused on completion of patient and family education with the patient and his wife. Upon arrival, patient was awake in bed and sat EOB to engage in conversation regarding education information. Throughout a 45 minute session, patient stood up and walked to the window X 3 but was easily redirected back to task. SLP provided education regarding patient's current swallowing function, diet recommendations, appropriate textures, swallowing compensatory strategies, how to thicken liquids, medication administration, and s/s of aspiration. Patient was also provided with thickening packets with education provided on how and where to purchase. SLP also facilitated session by providing education regarding patient's current cognitive functioning and strategies to utilize at home to maximize attention, memory, and overall safety including 24 hour supervision and establishing a daily routine. SLP also provided examples of tasks/activities patient can participate in at home to stay cognitively engaged. Patient and wife verbalized understanding and handouts were given to reinforce information. Patient left upright in recliner with wife present. Continue with current plan of care.   Patient has met 8 of 8 long term goals.  Patient to discharge at overall Mod level.   Reasons goals not met: N/A   Clinical Impression/Discharge Summary: Patient has made functional gains and has met 8 of 8 LTGs this admission. Currently, patient is consuming Dys. 3 textures with nectar-thick liquids with minimal overt s/s of aspiration and Min verbal cues for use of swallowing compensatory  strategies. Patient also requires overall Mod A multimodal cues to complete functional and familiar tasks safely in regards to sustained attention, orientation with use of aids, and functional problem solving. Patient also demonstrates improved engagement with increased vocal intensity improving intelligibility to 75% at the phrase and sentence level. Patient and family education is complete and patient will discharge home with 24 hour supervision from family. Patient would benefit from f/u SLP services to maximize his cognitive and swallowing function as well as his functional communication in order to reduce caregiver burden.   Care Partner:  Caregiver Able to Provide Assistance: Yes  Type of Caregiver Assistance: Physical;Cognitive  Recommendation:  24 hour supervision/assistance;Outpatient SLP  Rationale for SLP Follow Up: Maximize functional communication;Reduce caregiver burden;Maximize cognitive function and independence;Maximize swallowing safety   Equipment: Thickener   Reasons for discharge: Discharged from hospital;Treatment goals met   Patient/Family Agrees with Progress Made and Goals Achieved: Yes    Lakitha Gordy, [place] 08/02/2022, 3:15 PM

## 2022-08-02 NOTE — Progress Notes (Signed)
PROGRESS NOTE   Subjective/Complaints:  In net bed, appears awake and calm  Oriented to person , place , not time   ROS: limited by cognition Objective:   No results found. No results for input(s): "WBC", "HGB", "HCT", "PLT" in the last 72 hours.  Recent Labs    07/30/22 1405 08/01/22 0600  NA 141 140  K 3.8 3.6  CL 109 110  CO2 19* 23  GLUCOSE 158* 98  BUN 26* 21  CREATININE 0.99 1.07  CALCIUM 8.9 8.7*      Intake/Output Summary (Last 24 hours) at 08/02/2022 1042 Last data filed at 08/02/2022 0856 Gross per 24 hour  Intake 240 ml  Output --  Net 240 ml         Physical Exam: Vital Signs Blood pressure (!) 125/93, pulse 79, temperature 98.5 F (36.9 C), temperature source Oral, resp. rate 18, SpO2 97 %.   General: No acute distress Mood and affect are appropriate Heart: Regular rate and rhythm no rubs murmurs or extra sounds Lungs: Clear to auscultation, breathing unlabored, no rales or wheezes Abdomen: Positive bowel sounds, soft nontender to palpation, nondistended   Neurological: sleeping in enclosure bed Skin: small are of red scaly rash noted on bead- a little improved today Neuro:Drowsy but wakes to voice, follows simple commands with hand grasp bilaterally and lifts thighs , extends legs     Assessment/Plan: 1. Functional deficits which require 3+ hours per day of interdisciplinary therapy in a comprehensive inpatient rehab setting. Physiatrist is providing close team supervision and 24 hour management of active medical problems listed below. Physiatrist and rehab team continue to assess barriers to discharge/monitor patient progress toward functional and medical goals  Care Tool:  Bathing    Body parts bathed by patient: Right arm, Left arm, Chest, Abdomen, Front perineal area, Right upper leg, Buttocks, Left upper leg, Right lower leg, Left lower leg, Face         Bathing assist  Assist Level: Minimal Assistance - Patient > 75%     Upper Body Dressing/Undressing Upper body dressing   What is the patient wearing?: Pull over shirt    Upper body assist Assist Level: Contact Guard/Touching assist    Lower Body Dressing/Undressing Lower body dressing      What is the patient wearing?: Pants     Lower body assist Assist for lower body dressing: Contact Guard/Touching assist     Toileting Toileting    Toileting assist Assist for toileting: Minimal Assistance - Patient > 75%     Transfers Chair/bed transfer  Transfers assist     Chair/bed transfer assist level: Supervision/Verbal cueing     Locomotion Ambulation   Ambulation assist      Assist level: Minimal Assistance - Patient > 75% Assistive device: No Device Max distance: 50   Walk 10 feet activity   Assist     Assist level: Minimal Assistance - Patient > 75% Assistive device: No Device   Walk 50 feet activity   Assist    Assist level: Minimal Assistance - Patient > 75% Assistive device: No Device    Walk 150 feet activity   Assist Walk 150 feet activity  did not occur: Safety/medical concerns    Assistive device: No Device    Walk 10 feet on uneven surface  activity   Assist     Assist level: Minimal Assistance - Patient > 75% Assistive device: Other (comment) (no device)   Wheelchair     Assist Is the patient using a wheelchair?: Yes Type of Wheelchair: Manual    Wheelchair assist level: Minimal Assistance - Patient > 75% Max wheelchair distance: 20    Wheelchair 50 feet with 2 turns activity    Assist        Assist Level: Moderate Assistance - Patient 50 - 74%   Wheelchair 150 feet activity     Assist      Assist Level: Total Assistance - Patient < 25%   Blood pressure (!) 125/93, pulse 79, temperature 98.5 F (36.9 C), temperature source Oral, resp. rate 18, SpO2 97 %.  Medical Problem List and Plan: 1. Functional  deficits secondary to bilateral basal ganglia infarctions.  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  Plan for loop recorder             -patient may shower             -ELOS/Goals: 08/03/22 days             -Needs wearable heart rhythm monitor per cardiology  -Continue enclosure bed  Con't CIR- PT, OT and SLP 2.  Atrial fibrillation: eliquis started and aspirin d/ced. EKG ordered 12/23 and showed sinus tachycardia, now bradycardic, discussed with wife 3. Pain Management: Tylenol as needed 4. Impulsivity: telesitter ordered. Enclosure bed ordered.              -antipsychotic agents: continue Seroquel 12.5 mg twice daily  12/30- will add Gabapentin 100 mg TID  12/31- changed from liquid to capsule- should be able to be opened- write order for pharmacy to let me know 5. Neuropsych/cognition: This patient is not capable of making decisions on his own behalf. 6. Skin/Wound Care: Routine skin checks 7. Fluids/Electrolytes/Nutrition: Routine in and outs with follow-up chemistries    Latest Ref Rng & Units 08/01/2022    6:00 AM 07/30/2022    2:05 PM 07/29/2022    5:56 AM  BMP  Glucose 70 - 99 mg/dL 98  295  94   BUN 8 - 23 mg/dL 21  26  23    Creatinine 0.61 - 1.24 mg/dL  1.88  4.16   Sodium 135 - 145 mmol/L 140  141  141   Potassium 3.5 - 5.1 mmol/L 3.6  3.8  3.3   Chloride 98 - 111 mmol/L 110  109  111   CO2 22 - 32 mmol/L 23  19  23    Calcium 8.9 - 10.3 mg/dL 8.7  8.9  8.6     8.  Dysphagia.  Diet advanced to mechanical soft honey thick liquids by spoon.  Awaiting plan to continue nasogastric tube for nutritional support and hydration.  Follow-up speech therapy             -d/c IVF Took 100% breakfast this am and supper last PM  9.  Hx of COVID:  Airborne/contact precautions d/ced. Discussed loss of taste may be related to COVID 10.  Hyperlipidemia.  continue Crestor 20mg  11. DM 2? HGB A1C 5.7. CBGs have not been elevated. 12.  HTN. Controlled off meds  -controlled overall, continue to monitor    08/02/2022  6:49 AM 08/01/2022    9:37 PM 08/01/2022    1:46 PM  Vitals with BMI  Systolic 765 465 035  Diastolic 93 79 74  Pulse 79 62 51    13. Hypocalcemia: 1 tablet tums started daily 14. Suboptimal vitamin D: home supplement restarted 15. B12 deficiency: B12 injection ordered 12/23, receives q2weeks outpatient 16. Bradycardia:  Vitals:   08/01/22 2137 08/02/22 0649  BP: 134/79 (!) 125/93  Pulse: 62 79  Resp: 18 18  Temp: 98.1 F (36.7 C) 98.5 F (36.9 C)  SpO2: 98% 97%   EKG 12/22 with sinus brady unchanged vs Aug 2022, not on negative chronotropic meds  12/30- still bradycardic, but no Sx's/signs of poor energy  17. Rash of face. 08/02/22 improved 18. Hypokalemia- improved  -49meq KCL was given, K+ up to 3.8  12/31- K+ 3.6    LOS: 11 days A FACE TO FACE EVALUATION WAS PERFORMED  Charlett Blake 08/02/2022, 10:42 AM

## 2022-08-02 NOTE — Plan of Care (Signed)
  Problem: RH Swallowing Goal: LTG Patient will consume least restrictive diet using compensatory strategies with assistance (SLP) Description: LTG:  Patient will consume least restrictive diet using compensatory strategies with assistance (SLP) Outcome: Completed/Met Goal: LTG Patient will participate in dysphagia therapy to increase swallow function with assistance (SLP) Description: LTG:  Patient will participate in dysphagia therapy to increase swallow function with assistance (SLP) Outcome: Completed/Met Goal: LTG Pt will demonstrate functional change in swallow as evidenced by bedside/clinical objective assessment (SLP) Description: LTG: Patient will demonstrate functional change in swallow as evidenced by bedside/clinical objective assessment (SLP) Outcome: Completed/Met   Problem: RH Cognition - SLP Goal: RH LTG Patient will demonstrate orientation with cues Description:  LTG:  Patient will demonstrate orientation to person/place/time/situation with cues (SLP)   Outcome: Completed/Met   Problem: RH Expression Communication Goal: LTG Patient will increase speech intelligibility (SLP) Description: LTG: Patient will increase speech intelligibility at word/phrase/conversation level with cues, % of the time (SLP) Outcome: Completed/Met   Problem: RH Problem Solving Goal: LTG Patient will demonstrate problem solving for (SLP) Description: LTG:  Patient will demonstrate problem solving for basic/complex daily situations with cues  (SLP) Outcome: Completed/Met   Problem: RH Attention Goal: LTG Patient will demonstrate this level of attention during functional activites (SLP) Description: LTG:  Patient will will demonstrate this level of attention during functional activites (SLP) Outcome: Completed/Met   Problem: RH Awareness Goal: LTG: Patient will demonstrate awareness during functional activites type of (SLP) Description: LTG: Patient will demonstrate awareness during functional  activites type of (SLP) Outcome: Completed/Met

## 2022-08-02 NOTE — Progress Notes (Signed)
Occupational Therapy Session Note  Patient Details  Name: Douglas Cox MRN: [id number] Date of Birth: Apr 18, 1951  Today's Date: 08/02/2022 OT Individual Time: 9476-5465 and 1130-1200 OT Individual Time Calculation (min): 62 min and 30 min     Short Term Goals: Week 1:  OT Short Term Goal 1 (Week 1): STG= LTG d/t ELOS  Skilled Therapeutic Interventions/Progress Updates:    Session 1: OT session focused on ADL retraining, functional mobility, caregiver education, and discharge planning. Pt received in open enclosure bed with wife present. Wife informed she stayed last night with pt waking up only 1x. Pt noticeably fatigued however agreeable to toileting and changing pants. Pt ambulated with SBA to complete toileting in standing with SBA. Pt completed oral care while in standing with supervision. While sitting EOB, pt able to complete LB dressing with supervision. Continued caregiver education focusing on 24 hour supervision, assisting with ADLs, activities to promote cognitive remediation, etc. Wife engaging in conversation and asking appropriate questions. OT also informed SW. Session shortened due to pt falling asleep.   Session 2: OT session focused on functional mobility and cognitive remediation. Pt completed 3 peg board designs demonstrating 70% accuracy. Pt recognizing designs, however errors with correct use of colors. Completed functional mobility in hallway with decreased attention to environment (R side > L side). Pt returned to room and left sitting in recliner with wife.   Therapy Documentation Precautions:  Precautions Precautions: Fall Restrictions Weight Bearing Restrictions: No General: General OT Amount of Missed Time: 13 Minutes Vital Signs:  Pain: Pain Assessment Pain Scale: 0-10 Pain Score: 0-No pain ADL: ADL Eating: Supervision/safety Where Assessed-Eating: Bed level Grooming: Supervision/safety Where Assessed-Grooming: Edge of bed Upper Body Bathing:  Supervision/safety Where Assessed-Upper Body Bathing: Shower Lower Body Bathing: Contact guard Where Assessed-Lower Body Bathing: Shower Upper Body Dressing: Supervision/safety Where Assessed-Upper Body Dressing: Sitting at sink Lower Body Dressing: Minimal assistance Where Assessed-Lower Body Dressing: Sitting at sink Toileting: Minimal assistance Where Assessed-Toileting: Glass blower/designer: Psychiatric nurse Method: Counselling psychologist: Energy manager: Minimal cueing, Minimal assistance Social research officer, government Method: Heritage manager: Civil engineer, contracting with back Research officer, political party   Exercises:   Other Treatments:     Therapy/Group: Individual Therapy  [name] 08/02/2022, 12:22 PM

## 2022-08-02 NOTE — Progress Notes (Addendum)
Occupational Therapy Discharge Summary  Patient Details  Name: Douglas Cox MRN: [id number] Date of Birth: 1951/03/18  Date of Discharge from [facility] service:August 02, 2022  Patient has met 8 of 8 long term goals due to improved activity tolerance, improved balance, postural control, ability to compensate for deficits, improved attention, improved awareness, and improved coordination.  Patient to discharge at overall Supervision level.  Patient's care partner is independent to provide the necessary cognitive assistance at discharge.    Reasons goals not met: n/a  Recommendation:  Patient will benefit from ongoing skilled OT services in outpatient setting to continue to advance functional skills in the area of BADL.  Equipment: No equipment provided  Reasons for discharge: treatment goals met and discharge from hospital  Patient/family agrees with progress made and goals achieved: Yes  OT Discharge Precautions/Restrictions  Precautions Precautions: Fall Restrictions Weight Bearing Restrictions: No Pain  Denies pain ADL ADL Eating: Supervision/safety Where Assessed-Eating: Bed level Grooming: Supervision/safety Where Assessed-Grooming: Edge of bed Upper Body Bathing: Supervision/safety Where Assessed-Upper Body Bathing: Shower Lower Body Bathing: Contact guard Where Assessed-Lower Body Bathing: Shower Upper Body Dressing: Setup, Supervision/safety Where Assessed-Upper Body Dressing: Sitting at sink Lower Body Dressing: Setup, Supervision/safety Where Assessed-Lower Body Dressing: Sitting at sink Toileting: Supervision/safety Where Assessed-Toileting: Glass blower/designer: Distant supervision Armed forces technical officer Method: Counselling psychologist: Energy manager: Distant supervision Social research officer, government: Minimal cueing, Minimal assistance Social research officer, government Method: Heritage manager: Civil engineer, contracting with back Medical laboratory scientific officer: Within Financial controller Praxis: Impaired Praxis Impairment Details: Perseveration Praxis-Other Comments: improved from eval Cognition Cognition Overall Cognitive Status: Impaired/Different from baseline Arousal/Alertness: Awake/alert Orientation Level: Person;Place;Situation Person: Oriented Place: Disoriented Situation: Disoriented Memory: Impaired Memory Impairment: Storage deficit;Retrieval deficit;Decreased recall of new information;Decreased short term memory Attention: Focused Awareness: Impaired Awareness Impairment: Intellectual impairment Problem Solving: Impaired Problem Solving Impairment: Verbal basic;Functional basic Behaviors: Restless;Impulsive Safety/Judgment: Impaired Brief Interview for Mental Status (BIMS) Repetition of Three Words (First Attempt): 3 Temporal Orientation: Year: Missed by 2 to 5 years Temporal Orientation: Month: Accurate within 5 days Temporal Orientation: Day: Incorrect Recall: "Sock": Yes, no cue required Recall: "Blue": Yes, no cue required Recall: "Bed": Yes, after cueing ("a piece of furniture") BIMS Summary Score: 11 Sensation Sensation Light Touch: Appears Intact Hot/Cold: Appears Intact Coordination Gross Motor Movements are Fluid and Coordinated: Yes Fine Motor Movements are Fluid and Coordinated: Yes Motor  Motor Motor: Within Functional Limits Mobility  Bed Mobility Bed Mobility: Sit to Supine;Supine to Sit Supine to Sit: Independent Sit to Supine: Independent Transfers Sit to Stand: Independent Stand to Sit: Independent  Balance Static Sitting Balance Static Sitting - Level of Assistance: 7: Independent Dynamic Sitting Balance Dynamic Sitting - Level of Assistance: 7: Independent Static Standing Balance Static Standing - Balance Support: During functional activity Static Standing - Level of Assistance: 5: Stand by assistance Dynamic Standing Balance Dynamic Standing - Balance  Support: During functional activity Dynamic Standing - Level of Assistance: 5: Stand by assistance Extremity/Trunk Assessment RUE Assessment RUE Assessment: Exceptions to [facility] General Strength Comments: ataxia improved, generalized weakness 4/5 LUE Assessment LUE Assessment: Exceptions to [facility] General Strength Comments: generalized weakness 4/5   Douglas Cox Douglas Cox 08/02/2022, 11:30 PM

## 2022-08-02 NOTE — Progress Notes (Signed)
Inpatient Rehabilitation Care Coordinator Discharge Note   Patient Details  Name: Douglas Cox MRN: [id number] Date of Birth: June 14, 1951   Discharge location: HOME WITH WIFE WHO IS TAKING FMLA FROM WORK TO PROVIDE 24/7 SUPERVISION  Length of Stay: 12 DAYS  Discharge activity level: SUPERVISION LEVEL  Home/community participation: ACTIVE  Patient response DJ:SHFWYO Literacy - How often do you need to have someone help you when you read instructions, pamphlets, or other written material from your doctor or pharmacy?: Never  Patient response VZ:CHYIFO Isolation - How often do you feel lonely or isolated from those around you?: Patient unable to respond  Services provided included: MD, RD, PT, OT, SLP, RN, CM, TR, Pharmacy, Neuropsych, SW  Financial Services:  Charity fundraiser Utilized: [place] offered to/list presented to: PT AND WIFE  Follow-up services arranged:  Outpatient    Outpatient Servicies: [facility] [address]PT OT SP WILL CALL WIFE TO SET UP FOLLOW UP APPOINTMENTS      Patient response to transportation need: Is the patient able to respond to transportation needs?: Yes In the past 12 months, has lack of transportation kept you from medical appointments or from getting medications?: No In the past 12 months, has lack of transportation kept you from meetings, work, or from getting things needed for daily living?: No    Comments (or additional information): PT VERY MOBILE BUT NEEDS SUPERVISION FOR COGNITIVE ISSUES. ENCOURAGED WIFE TO HIRE PRIVATE DUTY TO GIVE HERSELF A  BREAK AND BE ABLE TO RUN ERRANDS. SHE HAS THE PRIVATE DUTY LIST TO FOLLOW UP WITH. SHE WAS HERE DAILY AND SAW HER HUSBAND'S CARE NEEDS.   Patient/Family verbalized understanding of follow-up arrangements:  Yes  Individual responsible for coordination of the follow-up plan: KAYE-WIFE [phone]  Confirmed correct DME delivered: Elease Hashimoto  08/02/2022    Elease Hashimoto

## 2022-08-02 NOTE — Progress Notes (Signed)
Physical Therapy Discharge Summary  Patient Details  Name: Douglas Cox MRN: [id number] Date of Birth: 29-Nov-1950  Date of Discharge from PT service:August 02, 2022  Today's Date: 08/02/2022 PT Individual Time: 1445-1530 PT Individual Time Calculation (min): 45 min    Patient has met 11 of 11 long term goals due to improved activity tolerance, improved balance, improved postural control, improved attention, improved awareness, and improved coordination.  Patient to discharge at an ambulatory level Independent.   Patient's care partner is independent to provide the necessary cognitive assistance at discharge.  Reasons goals not met: NA  Recommendation:  Patient will benefit from ongoing skilled PT services in outpatient setting to continue to advance safe functional mobility, address ongoing impairments in high level balance, safety awareness, and minimize fall risk.  Equipment: No equipment provided  Reasons for discharge: treatment goals met and discharge from hospital  Patient/family agrees with progress made and goals achieved: Yes  Skilled Therapeutic Interventions: Pt received seated in recliner and agrees to therapy. Wife and sister-in-law present for family education. No complaint of pain. Sit to stand independently. Pt ambulates x300' ortho gym with cues for navigation. Pt completes ramp navigation and car transfer with cues for sequencing, then takes seated rest break. Pt then ambulates to main gym and takes rest break prior to completing Berg balance test, as detailed below. Pt improves score from 34/56 on date of eval to 51/56 today, demonstrating MCID. Pt then completes x16 6" steps with R hand rails and cues for step placement for safety. Pt tasked with ambulating back to room without cues for navigation. Pt able to do so. PT educates on importance of creating routine for sleep-wake cycle, allowing pt and caregiver extra time for all tasks, and potential activities to help  pt recover. Pt left seated in recliner with all needs within reach.  PT Discharge Precautions/Restrictions Precautions Precautions: Fall Restrictions Weight Bearing Restrictions: No Pain Interference Pain Interference Pain Effect on Sleep: 1. Rarely or not at all Pain Interference with Therapy Activities: 1. Rarely or not at all Pain Interference with Day-to-Day Activities: 1. Rarely or not at all Vision/Perception  Vision - History Ability to See in Adequate Light: 0 Adequate Perception Perception: Within Functional Limits Praxis Praxis: Impaired Praxis Impairment Details: Perseveration Praxis-Other Comments: Improved from eval  Cognition Overall Cognitive Status: Impaired/Different from baseline Arousal/Alertness: Awake/alert Orientation Level: Oriented to person Focused Attention: Appears intact Sustained Attention Impairment: Verbal basic;Functional basic Memory Impairment: Storage deficit;Retrieval deficit;Decreased recall of new information;Decreased short term memory Decreased Short Term Memory: Verbal basic Awareness: Impaired Awareness Impairment: Intellectual impairment Problem Solving: Impaired Problem Solving Impairment: Verbal basic;Functional basic Safety/Judgment: Impaired Sensation Sensation Light Touch: Appears Intact Coordination Gross Motor Movements are Fluid and Coordinated: Yes Fine Motor Movements are Fluid and Coordinated: Yes Motor  Motor Motor: Within Functional Limits  Mobility Bed Mobility Bed Mobility: Sit to Supine;Supine to Sit Supine to Sit: Independent Sit to Supine: Independent Transfers Transfers: Sit to Stand;Stand to Sit;Stand Pivot Transfers Sit to Stand: Independent Stand to Sit: Independent Stand Pivot Transfers: Independent Locomotion  Gait Ambulation: Yes Gait Assistance: Independent Gait Distance (Feet): 300 Feet Assistive device: None Gait Gait: Yes Gait Pattern: Impaired Gait Pattern: Ataxic (very mild  ataxia) Gait velocity: decreased Stairs / Additional Locomotion Stairs: Yes Stairs Assistance: Supervision/Verbal cueing Stair Management Technique: Two rails Number of Stairs: 16 Height of Stairs: 6 Ramp: Supervision/Verbal cueing Curb: Supervision/Verbal cueing Wheelchair Mobility Wheelchair Mobility: No  Trunk/Postural Assessment  Cervical Assessment Cervical Assessment: Within Functional  Limits Thoracic Assessment Thoracic Assessment: Within Functional Limits Lumbar Assessment Lumbar Assessment: Within Functional Limits Postural Control Postural Control: Deficits on evaluation Righting Reactions: Slightly delayed but much improved from eval  Balance Balance Balance Assessed: Yes Standardized Balance Assessment Standardized Balance Assessment: Berg Balance Test Berg Balance Test Sit to Stand: Able to stand without using hands and stabilize independently Standing Unsupported: Able to stand safely 2 minutes Sitting with Back Unsupported but Feet Supported on Floor or Stool: Able to sit safely and securely 2 minutes Stand to Sit: Sits safely with minimal use of hands Transfers: Able to transfer safely, minor use of hands Standing Unsupported with Eyes Closed: Able to stand 10 seconds safely Standing Ubsupported with Feet Together: Able to place feet together independently and stand 1 minute safely From Standing, Reach Forward with Outstretched Arm: Can reach confidently >25 cm (10") From Standing Position, Pick up Object from Floor: Able to pick up shoe, needs supervision From Standing Position, Turn to Look Behind Over each Shoulder: Looks behind from both sides and weight shifts well Turn 360 Degrees: Able to turn 360 degrees safely in 4 seconds or less Standing Unsupported, Alternately Place Feet on Step/Stool: Able to stand independently and safely and complete 8 steps in 20 seconds Standing Unsupported, One Foot in Front: Able to plae foot ahead of the other independently  and hold 30 seconds Standing on One Leg: Tries to lift leg/unable to hold 3 seconds but remains standing independently Total Score: 51 Static Sitting Balance Static Sitting - Balance Support: Feet supported Static Sitting - Level of Assistance: 7: Independent Dynamic Sitting Balance Dynamic Sitting - Balance Support: Feet supported Dynamic Sitting - Level of Assistance: 7: Independent Static Standing Balance Static Standing - Balance Support: During functional activity Static Standing - Level of Assistance: 5: Stand by assistance Dynamic Standing Balance Dynamic Standing - Balance Support: During functional activity Dynamic Standing - Level of Assistance: 5: Stand by assistance Extremity Assessment  RLE Assessment RLE Assessment: Within Functional Limits LLE Assessment LLE Assessment: Within Functional Limits   Breck Coons, PT, DPT 08/02/2022, 4:26 PM

## 2022-08-02 NOTE — Progress Notes (Signed)
Patient ID: Douglas Cox, male   DOB: 02-12-1951, 72 y.o.   MRN: [id number]  Met with wife to answer her questions regarding denial of tests while on acute she will begin the appeal process for her BCBS regarding this. Discussed home health versus OP and private duty care. Encouraged her to contact some of the private duty care agencies to ask her questions and discuss availability for services. She will need a break and be able to go run her errands and still have someone with pt. Confirmed discharge is for tomorrow. OP referral has been made due to how high level pt is mobility wise. FMLA forms completed and wife has given to work. Set for discharge tomorrow.

## 2022-08-03 ENCOUNTER — Other Ambulatory Visit (HOSPITAL_COMMUNITY): Payer: Self-pay

## 2022-08-03 DIAGNOSIS — I69391 Dysphagia following cerebral infarction: Secondary | ICD-10-CM

## 2022-08-03 DIAGNOSIS — G479 Sleep disorder, unspecified: Secondary | ICD-10-CM

## 2022-08-03 DIAGNOSIS — R001 Bradycardia, unspecified: Secondary | ICD-10-CM

## 2022-08-03 MED ORDER — ROSUVASTATIN CALCIUM 20 MG PO TABS
20.0000 mg | ORAL_TABLET | Freq: Every day | ORAL | 0 refills | Status: DC
  Filled 2022-08-03: qty 30, 30d supply, fill #0

## 2022-08-03 MED ORDER — GUAIFENESIN 100 MG/5ML PO LIQD
5.0000 mL | Freq: Four times a day (QID) | ORAL | Status: DC | PRN
  Administered 2022-08-03: 5 mL via ORAL
  Filled 2022-08-03: qty 5

## 2022-08-03 MED ORDER — APIXABAN 5 MG PO TABS
5.0000 mg | ORAL_TABLET | Freq: Two times a day (BID) | ORAL | 0 refills | Status: DC
  Filled 2022-08-03: qty 60, 30d supply, fill #0

## 2022-08-03 MED ORDER — MAGNESIUM OXIDE 400 MG PO TABS
200.0000 mg | ORAL_TABLET | Freq: Every day | ORAL | 0 refills | Status: DC
  Filled 2022-08-03: qty 30, 60d supply, fill #0

## 2022-08-03 MED ORDER — VITAMIN D3 25 MCG PO TABS
1000.0000 [IU] | ORAL_TABLET | Freq: Every day | ORAL | 0 refills | Status: DC
  Filled 2022-08-03: qty 30, 30d supply, fill #0

## 2022-08-03 MED ORDER — QUETIAPINE FUMARATE 25 MG PO TABS
25.0000 mg | ORAL_TABLET | Freq: Three times a day (TID) | ORAL | 0 refills | Status: DC | PRN
  Filled 2022-08-03: qty 15, 5d supply, fill #0

## 2022-08-03 MED ORDER — QUETIAPINE FUMARATE 25 MG PO TABS: 25.0000 mg | ORAL_TABLET | Freq: Every day | ORAL | Status: DC

## 2022-08-03 MED ORDER — GABAPENTIN 100 MG PO CAPS
100.0000 mg | ORAL_CAPSULE | Freq: Three times a day (TID) | ORAL | 0 refills | Status: DC
  Filled 2022-08-03: qty 90, 30d supply, fill #0

## 2022-08-03 MED ORDER — GUAIFENESIN 100 MG/5ML PO LIQD
5.0000 mL | Freq: Four times a day (QID) | ORAL | 0 refills | Status: DC | PRN
  Filled 2022-08-03: qty 120, 6d supply, fill #0

## 2022-08-03 MED ORDER — POLYETHYLENE GLYCOL 3350 17 G PO PACK: 17.0000 g | PACK | Freq: Every day | ORAL | 0 refills | Status: DC

## 2022-08-03 MED ORDER — CALCIUM CARBONATE ANTACID 500 MG PO CHEW: 1.0000 | CHEWABLE_TABLET | Freq: Every day | ORAL | Status: DC

## 2022-08-03 MED ORDER — QUETIAPINE FUMARATE 25 MG PO TABS
25.0000 mg | ORAL_TABLET | Freq: Every day | ORAL | 0 refills | Status: DC
  Filled 2022-08-03: qty 30, 30d supply, fill #0

## 2022-08-03 MED ORDER — FAMOTIDINE 20 MG PO TABS
20.0000 mg | ORAL_TABLET | Freq: Two times a day (BID) | ORAL | 0 refills | Status: DC
  Filled 2022-08-03: qty 60, 30d supply, fill #0

## 2022-08-03 MED ORDER — MODAFINIL 100 MG PO TABS
100.0000 mg | ORAL_TABLET | Freq: Every day | ORAL | 0 refills | Status: DC
  Filled 2022-08-03: qty 30, 30d supply, fill #0

## 2022-08-03 NOTE — Progress Notes (Signed)
PROGRESS NOTE   Subjective/Complaints:  Wife reports pt didn't sleep that well last night. Was coughing quite a bit after gabapentin. Didn't receive seroquel until 0400  ROS: Limited due to cognitive/behavioral as pt hard to awaken.   Objective:   No results found. No results for input(s): "WBC", "HGB", "HCT", "PLT" in the last 72 hours.  Recent Labs    08/01/22 0600  NA 140  K 3.6  CL 110  CO2 23  GLUCOSE 98  BUN 21  CREATININE 1.07  CALCIUM 8.7*     Intake/Output Summary (Last 24 hours) at 08/03/2022 7858 Last data filed at 08/02/2022 1832 Gross per 24 hour  Intake 240 ml  Output --  Net 240 ml        Physical Exam: Vital Signs Blood pressure 124/73, pulse (!) 49, temperature 98.8 F (37.1 C), temperature source Oral, resp. rate 18, SpO2 98 %.   Constitutional: No distress . Vital signs reviewed. HEENT: NCAT, EOMI, oral membranes moist Neck: supple Cardiovascular: RRR without murmur. No JVD    Respiratory/Chest: CTA Bilaterally without wheezes or rales. Normal effort    GI/Abdomen: BS +, non-tender, non-distended Ext: no clubbing, cyanosis, or edema Psych: pleasant and cooperative  Neurological: asleep. Not following commands Skin: small are of red scaly rash noted on head-       Assessment/Plan: 1. Functional deficits which require 3+ hours per day of interdisciplinary therapy in a comprehensive inpatient rehab setting. Physiatrist is providing close team supervision and 24 hour management of active medical problems listed below. Physiatrist and rehab team continue to assess barriers to discharge/monitor patient progress toward functional and medical goals  Care Tool:  Bathing    Body parts bathed by patient: Right arm, Left arm, Chest, Abdomen, Front perineal area, Buttocks, Right upper leg, Left upper leg, Right lower leg, Left lower leg, Face         Bathing assist Assist Level:  Supervision/Verbal cueing     Upper Body Dressing/Undressing Upper body dressing   What is the patient wearing?: Pull over shirt    Upper body assist Assist Level: Supervision/Verbal cueing    Lower Body Dressing/Undressing Lower body dressing      What is the patient wearing?: Pants     Lower body assist Assist for lower body dressing: Set up assist     Toileting Toileting    Toileting assist Assist for toileting: Supervision/Verbal cueing     Transfers Chair/bed transfer  Transfers assist     Chair/bed transfer assist level: Independent     Locomotion Ambulation   Ambulation assist      Assist level: Independent Assistive device: No Device Max distance: 300'   Walk 10 feet activity   Assist     Assist level: Independent Assistive device: No Device   Walk 50 feet activity   Assist    Assist level: Independent Assistive device: No Device    Walk 150 feet activity   Assist Walk 150 feet activity did not occur: Safety/medical concerns  Assist level: Independent Assistive device: No Device    Walk 10 feet on uneven surface  activity   Assist     Assist level: Supervision/Verbal  cueing Assistive device: Other (comment) (no device)   Wheelchair     Assist Is the patient using a wheelchair?: No Type of Wheelchair: Manual    Wheelchair assist level: Minimal Assistance - Patient > 75% Max wheelchair distance: 20    Wheelchair 50 feet with 2 turns activity    Assist        Assist Level: Moderate Assistance - Patient 50 - 74%   Wheelchair 150 feet activity     Assist      Assist Level: Total Assistance - Patient < 25%   Blood pressure 124/73, pulse (!) 49, temperature 98.8 F (37.1 C), temperature source Oral, resp. rate 18, SpO2 98 %.  Medical Problem List and Plan: 1. 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  Plan for loop recorder             -patient may shower             -dc home today  -f/u with me at [facility] in 4 weeks, needs neuro and pcp f/u 2.  Atrial fibrillation: eliquis started and aspirin d/ced. EKG ordered 12/23 and showed sinus tachycardia, now bradycardic, discussed with wife 3. Pain Management: Tylenol as needed 4. Impulsivity: telesitter ordered. Enclosure bed ordered.              -antipsychotic agents: dc home on seroquel 12.5 to 25mg  qhs  -dc gabapentin d/t cough    5. Neuropsych/cognition: This patient is not capable of making decisions on his own behalf. 6. Skin/Wound Care: Routine skin checks 7. Fluids/Electrolytes/Nutrition: Routine in and outs with follow-up chemistries    Latest Ref Rng & Units 08/01/2022    6:00 AM 07/30/2022    2:05 PM 07/29/2022    5:56 AM  BMP  Glucose 70 - 99 mg/dL 98  158  94   BUN 8 - 23 mg/dL 21  26  23    Creatinine 0.61 - 1.24 mg/dL 1.07  0.99  1.07   Sodium 135 - 145 mmol/L 140  141  141   Potassium 3.5 - 5.1 mmol/L 3.6  3.8  3.3   Chloride 98 - 111 mmol/L 110  109  111   CO2 22 - 32 mmol/L 23  19  23    Calcium 8.9 - 10.3 mg/dL 8.7  8.9  8.6     8.  Dysphagia.  Diet advanced to mechanical soft honey thick liquids by spoon.  Awaiting plan to continue nasogastric tube for nutritional support and hydration.  Follow-up speech therapy             -eating well when awake 9.  Hx of COVID:  Airborne/contact precautions d/ced. Discussed loss of taste may be related to COVID 10.  Hyperlipidemia.  continue Crestor 20mg  11. DM 2? HGB A1C 5.7. CBGs have not been elevated. 12. HTN. Controlled off meds  -controlled overall, continue to monitor    08/02/2022    8:42 PM 08/02/2022    2:51 PM 08/02/2022    6:49 AM  Vitals with BMI  Systolic 338 250 539  Diastolic 73 97 93  Pulse 49 55 79    13. Hypocalcemia: 1 tablet tums started daily 14. Suboptimal vitamin D: home supplement restarted 15. B12 deficiency:  B12 injection ordered 12/23, receives q2weeks outpatient 16. Bradycardia:  Vitals:   08/02/22 1451 08/02/22 2042  BP: (!) 118/97 124/73  Pulse: (!) 55 (!)  49  Resp: 16 18  Temp: 98.2 F (36.8 C) 98.8 F (37.1 C)  SpO2: 98% 98%   EKG 12/22 with sinus brady unchanged vs Aug 2022, not on negative chronotropic meds  1/2 sinus brady at baseline  17. Rash of face. 08/02/22 improved 18. Hypokalemia- improved  -69meq KCL was given, K+ up to 3.8  12/31- K+ 3.6    LOS: 12 days A FACE TO FACE EVALUATION WAS PERFORMED  Meredith Staggers 08/03/2022, 9:22 AM

## 2022-08-03 NOTE — Progress Notes (Signed)
Nursing education and care plan are completed/met for discharge to home,.

## 2022-08-03 NOTE — Progress Notes (Signed)
Inpatient Rehabilitation Discharge Medication Review by a Pharmacist   A complete drug regimen review was completed for this patient to identify any potential clinically significant medication issues.   High Risk Drug Classes Is patient taking? Indication by Medication  Antipsychotic Yes Seroquel-impulsivity  Anticoagulant Yes  Eliquis - Afib  Antibiotic No    Opioid No    Antiplatelet No Hypoglycemics/insulin No    Vasoactive Medication No    Chemotherapy No    Other Yes Rosuvastatin-HLD Cholecalciferol- suboptimal Vitamin D level/supplement Calcium (Tums)- hypocalcemia Famotidine-GERD        Type of Medication Issue Identified Description of Issue Recommendation(s)  Drug Interaction(s) (clinically significant)        Duplicate Therapy        Allergy        No Medication Administration End Date        Incorrect Dose        Additional Drug Therapy Needed        Significant med changes from prior encounter (inform family/care partners about these prior to discharge).      Other            Clinically significant medication issues were identified that warrant physician communication and completion of prescribed/recommended actions by midnight of the next day:  No   Name of provider notified for urgent issues identified:    Provider Method of Notification:    Pharmacist comments:    Time spent performing this drug regimen review (minutes):  [place], RPh Clinical Pharmacist 08/03/2022 8:30 AM

## 2022-08-03 NOTE — Progress Notes (Signed)
Patient discharged off of unit with all belongings. Discharge papers/instructions explained by physician assistant to family. Patient and family have no further questions at time of discharge. No complications noted at this time.  Douglas Cox L Douglas Cox  

## 2022-08-10 ENCOUNTER — Other Ambulatory Visit: Payer: Self-pay

## 2022-08-10 ENCOUNTER — Encounter: Payer: Self-pay | Admitting: Physical Therapy

## 2022-08-10 ENCOUNTER — Ambulatory Visit: Payer: Medicare HMO | Attending: Physical Medicine & Rehabilitation

## 2022-08-10 ENCOUNTER — Ambulatory Visit: Payer: Medicare HMO | Admitting: Physical Therapy

## 2022-08-10 VITALS — BP 113/66 | HR 42

## 2022-08-10 DIAGNOSIS — R0789 Other chest pain: Secondary | ICD-10-CM | POA: Insufficient documentation

## 2022-08-10 DIAGNOSIS — M6281 Muscle weakness (generalized): Secondary | ICD-10-CM | POA: Insufficient documentation

## 2022-08-10 DIAGNOSIS — I69318 Other symptoms and signs involving cognitive functions following cerebral infarction: Secondary | ICD-10-CM | POA: Insufficient documentation

## 2022-08-10 DIAGNOSIS — I69254 Hemiplegia and hemiparesis following other nontraumatic intracranial hemorrhage affecting left non-dominant side: Secondary | ICD-10-CM | POA: Diagnosis present

## 2022-08-10 DIAGNOSIS — G8194 Hemiplegia, unspecified affecting left nondominant side: Secondary | ICD-10-CM | POA: Insufficient documentation

## 2022-08-10 DIAGNOSIS — R41840 Attention and concentration deficit: Secondary | ICD-10-CM | POA: Insufficient documentation

## 2022-08-10 DIAGNOSIS — K5900 Constipation, unspecified: Secondary | ICD-10-CM | POA: Insufficient documentation

## 2022-08-10 NOTE — Therapy (Deleted)
OUTPATIENT SPEECH LANGUAGE PATHOLOGY EVALUATION   Patient Name: Douglas Cox MRN: [id number] DOB:[birth year], 72 y.o., male Today's Date: 08/10/2022  PCP: N/A REFERRING PROVIDER: Charlett Blake, MD  END OF SESSION:   Past Medical History:  Diagnosis Date   Celiac disease    Retinal detachment    Past Surgical History:  Procedure Laterality Date   CATARACT EXTRACTION Bilateral    Dr. Valetta Close   EYE SURGERY     FRACTURE SURGERY     GAS INSERTION Right 05/21/2021   Procedure: INSERTION OF GAS - C3F8;  Surgeon: Bernarda Caffey, MD;  Location: [facility];  Service: Ophthalmology;  Laterality: Right;   GAS/FLUID EXCHANGE Right 05/21/2021   Procedure: GAS/FLUID EXCHANGE;  Surgeon: Bernarda Caffey, MD;  Location: [facility];  Service: Ophthalmology;  Laterality: Right;   HERNIA REPAIR     IR CT HEAD LTD  07/17/2022   IR PERCUTANEOUS ART THROMBECTOMY/INFUSION INTRACRANIAL INC DIAG ANGIO  07/17/2022   IR US GUIDE VASC ACCESS RIGHT  07/17/2022   PARS PLANA VITRECTOMY Right 05/21/2021   Procedure: PARS PLANA VITRECTOMY WITH 25 GAUGE;  Surgeon: Bernarda Caffey, MD;  Location: [facility];  Service: Ophthalmology;  Laterality: Right;   PERFLUORONE INJECTION Right 05/21/2021   Procedure: PERFLUORONE INJECTION;  Surgeon: Bernarda Caffey, MD;  Location: [facility];  Service: Ophthalmology;  Laterality: Right;   PHOTOCOAGULATION WITH LASER Right 05/21/2021   Procedure: PHOTOCOAGULATION WITH LASER;  Surgeon: Bernarda Caffey, MD;  Location: [facility];  Service: Ophthalmology;  Laterality: Right;   RADIOLOGY WITH ANESTHESIA N/A 07/17/2022   Procedure: IR WITH ANESTHESIA;  Surgeon: Radiologist, Medication, MD;  Location: [facility];  Service: Radiology;  Laterality: N/A;   RETINAL DETACHMENT SURGERY     Patient Active Problem List   Diagnosis Date Noted   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 ([place]) 07/22/2022   Protein-calorie malnutrition, severe 07/19/2022   Stroke (cerebrum) ([place]) 07/17/2022   Mediastinal  mass 03/25/2021   Celiac disease    Alcohol dependence ([place])    Bradycardia     ONSET DATE: ***   REFERRING DIAG: I63.81 (ICD-10-CM) - Other cerebral infarction due to occlusion or stenosis of small artery   THERAPY DIAG:  No diagnosis found.  Rationale for Evaluation and Treatment: Rehabilitation  SUBJECTIVE:   SUBJECTIVE STATEMENT: *** Pt accompanied by: significant other  PERTINENT HISTORY: "Douglas Cox is a 72 year old right-handed male with history of celiac disease and retinal detachment, quit smoking 16 years ago. Per chart review patient lives with spouse. Two-level home half bath on main level with bed and bath upstairs. Independent prior to admission and driving. Presented 07/17/2022 with acute onset of left-sided weakness as well as right frontal headache. Cranial CT scan showed loss of gray-white differentiation in the right insular cortex lateral aspect of right lentiform nucleus, and right temporal tip consistent with acute infarction. No acute hemorrhage. 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 Status post TNK as well as recanalization per interventional radiology. MRI follow-up showed acute/subacute nonhemorrhagic infarct of the right caudate head and lentiform nucleus as noted on previous CT. Echocardiogram with ejection fraction of 60 to 65%. Admission chemistries unremarkable except glucose 110, hemoglobin A1c 5.7, SARS coronavirus positive. Currently maintained on aspirin for CVA prophylaxis. Airborne/contact precautions for positive COVID."   PAIN: Are you having pain? {OPRCPAIN:27236}  FALLS: Has patient fallen in last 6 months?  {MVHQIONG:29528}  LIVING ENVIRONMENT: Lives with: {OPRC lives with:25569::"lives with  their family"} Lives in: {Lives in:25570}  PLOF:  Level of assistance: {WUXLKGM:01027} Employment: {SLPemployment:25674}  PATIENT GOALS: ***  OBJECTIVE:    DIAGNOSTIC FINDINGS: ***  COGNITION: Overall cognitive status: Impaired Areas of impairment:  {cognitiveimpairmentslp:27409} Functional deficits: ***  AUDITORY COMPREHENSION: Overall auditory comprehension: {IMPAIRED:25374} YES/NO questions: {IMPAIRED:25374} Following directions: {IMPAIRED:25374} Conversation: {SLP conversation:25430} Interfering components: {SLP interfering components:25431} Effective technique: {SLP effective technique:25432}  READING COMPREHENSION: {SLPreadingcomprehension:27140}  EXPRESSION: {SLP EXPRESSION:25433}  VERBAL EXPRESSION: Level of generative/spontaneous verbalization: {SLP level of generative/spontaneious verbalization:25435} Automatic speech: {SLP ATOMIC SPEECH:25434}  Repetition: {SLPrepetion:27212} Naming: {SLPnaming:27214} Pragmatics: {slppragmatics:27216} Comments: *** Interfering components: {SLP INTERFERING COMPONENTS:25436} Effective technique: {SLP EFFECTIVE TECHNIQUE:25437} Non-verbal means of communication: {SLP non verbal means of communication:25438}  WRITTEN EXPRESSION: Dominant hand: {RIGHT/LEFT:20294} Written expression: {slpwrittenexp:27209}  MOTOR SPEECH: Overall motor speech: {slpimpaired:27210} Level of impairment: {SLP level of impairment:25441} Respiration: {respbreathing:27195} Phonation: {SLP phonation:25439} Resonance: {SLP resonance:25440} Articulation: {SLParticulation:27218} Intelligibility: {SLP Intelligible:25442} Motor planning: {slpmotorspeecherrors:27220} Motor speech errors: {SLP motor speech errors:25443} Interfering components: {SLP Interfering components (MS):25444} Effective technique: {SLP effective technique (MS):25445}  ORAL MOTOR EXAMINATION: Overall status: {OMESLP2:27645} Comments: ***  RECOMMENDATIONS FROM OBJECTIVE SWALLOW STUDY (MBSS/FEES):   07-30-22 MBSS: Patient demonstrates a moderate oropharyngeal dysphagia that is impacted by patient's current cognitive deficits. Patient's  oral phase is characterized by poor awareness of bolus resulting in inconsistent timing of swallowing trigger. With solids, patient needed verbal cues (snapping and counting) to attend and initiate a swallow with all boluses contained in the velleculae. With liquids, patient consistently triggered his swallow at the pyriform sinuses. This resulted in sensed aspiration of thin liquids via cup. Patient also with inconsistent amounts of pharyngeal residue that cleared with a 2nd subsequent swallow. No penetration or aspiration noted with nectar-thick liquids except for 1 episode of flash penetration. Provided thorough education to the patient's wife regarding patient's current swallowing function and that he is currently at a higher risk of aspiration due to cognitive deficits. She verbalized understanding and agreement with recommendations of continuing Dys. 3 textures but upgrading to nectar-thick liquids via cup with strict aspiration precautions and supervision. This will hopefully improve patient's intake of liquids with improved ability to stay hydrated without IV fluids prior to potential discharge on 08/03/22.  Objective swallow impairments: cognitive deficits, poor bolus awareness, delayed swallow initiation  Objective recommended compensations: Dysphagia #3 and NTL via cup  CLINICAL SWALLOW ASSESSMENT:   Current diet: Dysphagia 3 (mechanical soft) and nectar thick liquids Dentition: {dentition:27197} Patient directly observed with POs: {POobserved:27199} Feeding: {slp feeding:27200} Liquids provided by: {SLPliquids:27201} Oral phase signs and symptoms: {SLPoralphase:27202} Pharyngeal phase signs and symptoms: {SLPpharyngealphase:27203} Comments: ***  STANDARDIZED ASSESSMENTS: {SLPstandardizedassessment:27092}  PATIENT REPORTED OUTCOME MEASURES (PROM): {SLPPROM:27095}   TODAY'S TREATMENT:                                                                                                                                           08-10-22:  PATIENT EDUCATION: Education details: see above Person educated: Patient and Spouse Education method: Explanation, Demonstration, and Handouts Education comprehension: verbalized understanding, returned demonstration, and needs further education   GOALS: Goals reviewed with patient? Yes  SHORT TERM GOALS: Target date: 09/07/2022  *** Baseline: Goal status: {GOALSTATUS:25110}  2.  *** Baseline:  Goal status: {GOALSTATUS:25110}  3.  *** Baseline:  Goal status: {GOALSTATUS:25110}  4.  *** Baseline:  Goal status: {GOALSTATUS:25110}  5.  *** Baseline:  Goal status: {GOALSTATUS:25110}  6.  *** Baseline:  Goal status: {GOALSTATUS:25110}  LONG TERM GOALS: Target date: 10/05/2022  *** Baseline:  Goal status: {GOALSTATUS:25110}  2.  *** Baseline:  Goal status: {GOALSTATUS:25110}  3.  *** Baseline:  Goal status: {GOALSTATUS:25110}  4.  *** Baseline:  Goal status: {GOALSTATUS:25110}  5.  *** Baseline:  Goal status: {GOALSTATUS:25110}  6.  *** Baseline:  Goal status: {GOALSTATUS:25110}  ASSESSMENT:  CLINICAL IMPRESSION: Patient is a 72 y.o. male who was seen today for CVA in December 2023. MBSS completed 07-30-22 with rec for dysphagia 3 and NTL (see results above).    OBJECTIVE IMPAIRMENTS: include {SLPOBJIMP:27107}. These impairments are limiting patient from {SLPLIMIT:27108}. Factors affecting potential to achieve goals and functional outcome are {SLP factors:25450}. Patient will benefit from skilled SLP services to address above impairments and improve overall function.  REHAB POTENTIAL: {rehabpotential:25112}  PLAN:  SLP FREQUENCY: 2x/week  SLP DURATION: 12 weeks  PLANNED INTERVENTIONS: Aspiration precaution training, Pharyngeal strengthening exercises, Diet toleration management , Language facilitation, Environmental controls, Trials of upgraded texture/liquids, Cueing hierachy, Cognitive  reorganization, Internal/external aids, Functional tasks, Multimodal communication approach, SLP instruction and feedback, Compensatory strategies, Patient/family education, and Re-evaluation    Gracy Racer, CCC-SLP 08/10/2022, 8:44 AM

## 2022-08-10 NOTE — Therapy (Unsigned)
OUTPATIENT PHYSICAL THERAPY NEURO EVALUATION   Patient Name: Douglas Cox MRN: [id number] DOB:1950-09-02, 72 y.o., male Today's Date: 08/10/2022   PCP: Dr. Lorrene Reid ([facility]) REFERRING PROVIDER: Erick Colace, MD  END OF SESSION:  PT End of Session - 08/10/22 1442     Visit Number 1    PT Start Time 1430   Pt late and missed ST, elected to stay for PT and be seen early.   Equipment Utilized During Treatment Gait belt    Activity Tolerance Patient tolerated treatment well    Behavior During Therapy Flat affect;Restless             Past Medical History:  Diagnosis Date   Celiac disease    Retinal detachment    Past Surgical History:  Procedure Laterality Date   CATARACT EXTRACTION Bilateral    Dr. Cathey Endow   EYE SURGERY     FRACTURE SURGERY     GAS INSERTION Right 05/21/2021   Procedure: INSERTION OF GAS - C3F8;  Surgeon: Rennis Chris, MD;  Location: [facility] OR;  Service: Ophthalmology;  Laterality: Right;   GAS/FLUID EXCHANGE Right 05/21/2021   Procedure: GAS/FLUID EXCHANGE;  Surgeon: Rennis Chris, MD;  Location: [facility] OR;  Service: Ophthalmology;  Laterality: Right;   HERNIA REPAIR     IR CT HEAD LTD  07/17/2022   IR PERCUTANEOUS ART THROMBECTOMY/INFUSION INTRACRANIAL INC DIAG ANGIO  07/17/2022   IR US GUIDE VASC ACCESS RIGHT  07/17/2022   PARS PLANA VITRECTOMY Right 05/21/2021   Procedure: PARS PLANA VITRECTOMY WITH 25 GAUGE;  Surgeon: Rennis Chris, MD;  Location: [facility] OR;  Service: Ophthalmology;  Laterality: Right;   PERFLUORONE INJECTION Right 05/21/2021   Procedure: PERFLUORONE INJECTION;  Surgeon: Rennis Chris, MD;  Location: [facility] OR;  Service: Ophthalmology;  Laterality: Right;   PHOTOCOAGULATION WITH LASER Right 05/21/2021   Procedure: PHOTOCOAGULATION WITH LASER;  Surgeon: Rennis Chris, MD;  Location[facility] OR;  Service: Ophthalmology;  Laterality: Right;   RADIOLOGY WITH ANESTHESIA N/A 07/17/2022   Procedure: IR WITH ANESTHESIA;  Surgeon: Radiologist,  Medication, MD;  Location: [facility] OR;  Service: Radiology;  Laterality: N/A;   RETINAL DETACHMENT SURGERY     Patient Active Problem List   Diagnosis Date Noted   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 ([facility]) 07/22/2022   Protein-calorie malnutrition, severe 07/19/2022   Stroke (cerebrum) ([facility]) 07/17/2022   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   Celiac disease    Alcohol dependence ([facility])    Bradycardia     ONSET DATE: 08/04/2022 (referral date)  REFERRING DIAG: I63.81 (ICD-10-CM) - Other cerebral infarction due to occlusion or stenosis of small artery  THERAPY DIAG:  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 Pt accompanied by: significant other-Kay (Wife-on phone in waiting room at onset of session, joined at 2:35pm, in and out with phone calls during session)  PERTINENT HISTORY: Mediastinal mass, bradycardia  From hospital notes:   Presented to ED w/ acute onset of left-sided weakness as well as right frontal headache.  Admitted to [facility] on 07/17/2022.  See imaging. At discharge from inpatient rehab on 08/03/2022 (admitted 07/22/2022):  Ambulates to the bathroom supervision for safety. Patient was able to follow 75% of commands most of the time and able to complete tasks 75%. Wife attending full therapies for ongoing agitation. Patient performed oral care with supervision with minimal cues. Perform tasks of following directions five-step on paper pen  task asking appropriate questions only requiring minimal cues to keep place. Speech therapy continue to follow for dysphagia and appropriate education with family. He was still requiring minimal verbal and visual cues for sustained attention. Full family teaching completed plan discharge to home with recommendations of 24-hour supervision.  PAIN:  Are you having pain? No  PRECAUTIONS: Fall, pt has heart monitor to be removed 08/11/2022  WEIGHT BEARING RESTRICTIONS: No  FALLS: Has patient fallen in last 6 months? Yes. Number of falls 2 (fell before stroke when he was on a ladder and ladder went backwards; fell and hit head when his left side went out during stroke)  LIVING ENVIRONMENT: Lives with: lives with their spouse Lives in: House/apartment Stairs: Yes: Internal: 14 steps; on left going up and External: 14 steps; on right going up Has following equipment at home: Single point cane  PLOF: Independent with basic ADLs, Independent with gait, Independent with transfers, and Wife states he stands on one leg to put pants on.  PATIENT GOALS: "I don't want to be here.  I am an actor."  When asked about wanting to do speech therapy pt indicates yes.  Re-asked about PT, pt replies no.  See edu.   OBJECTIVE:   DIAGNOSTIC FINDINGS: (From rehab discharge note) CT scan showed loss of gray-white differentiation in the right insular cortex lateral aspect of the right lentiform and right temporal tip consistent with acute infarction.  No acute hemorrhage.  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  Status post TNK as well as recanalization per interventional radiology.  MRI follow-up showed acute/subacute nonhemorrhagic infarct of the right caudate head and lentiform nucleus as noted on prior previous CT.  COGNITION: Overall cognitive status:  Delayed processing   TODAY'S TREATMENT:                                                                                                                               DATE: 08/10/2022    PATIENT EDUCATION: Education details:  Discussed most important aspect being that pt is safe and functional with gait, transfers, etc with wife stating she feels that he is, but she remains worried about his muscle atrophy.  Extensive discussion about benefit of outpatient therapy for atrophy and generalized weakness per wife's concern.  Wife acknowledges pt is not motivated and will not likely do exercises at home.  Discussed compliance importance to some form of exercise (HEP, other salient task) for benefit and carryover as the basis of outpatient PT.  Discussed purchase of recumbent bike w/ wife per wife's interest.  Pt used to cycle in light capacity and may be more interested in this style of aerobic task than walking overground or on treadmill.  Wife speaks to patient about wanting to do PT and pt continues to indicate no interest, wife indicates to therapist that this is not surprising.  PT able to compromise with pt in regards to providing an introductory balance/strength HEP to trial.  Edu on obtaining new referral to return if following speech therapy completion, pt is more interested in PT or feeling more motivated.  Wife is agreeable to this idea as well.  Provided business card for wife to contact PT in event that she has further safety or exercise questions.   Person educated: Patient and Spouse Education method: Explanation Education comprehension: verbalized understanding  HOME EXERCISE PROGRAM: Access Code: [email or web address] URL: [email or web address] Date: 08/10/2022 Prepared by: Elease Etienne  Exercises - Standing Hip Abduction with Resistance at Ankles and Counter Support  - 1 x daily - 7 x weekly - 2 sets - 10 reps - Forward and Backward Monster Walk with Counter Support  - 1 x daily - 7 x weekly - 3 sets - 10 reps  *Green theraband! ASSESSMENT:  CLINICAL IMPRESSION: Patient is a 72 y.o. male who was seen  today for physical therapy evaluation and treatment for right internal carotid infarction with left hemibody deficits.  Pt has a significant PMH of mediastinal mass and bradycardia.  Identified impairments include mild hyperextension noted on left LE, increasing processing time, decreased activity tolerance and motivation towards activity.  No formal assessments of fall risk or otherwise completed due to pt preference to not do physical therapy at this time.  PT provided HEP for dynamic balance and hip strength for wife and pt to trial at home.  Extensi  CLINICAL DECISION MAKING: Stable/uncomplicated  EVALUATION COMPLEXITY: Low  PLAN:  PT FREQUENCY: {rehab frequency:25116}  PT DURATION: {rehab duration:25117}  PLANNED INTERVENTIONS: {rehab planned interventions:25118::"Therapeutic exercises","Therapeutic activity","Neuromuscular re-education","Balance training","Gait training","Patient/Family education","Self Care","Joint mobilization"}  PLAN FOR NEXT SESSION: Bary Richard, PT, DPT 08/10/2022, 2:44 PM

## 2022-08-10 NOTE — Patient Instructions (Signed)
Access Code: [email or web address] URL: [email or web address] Date: 08/10/2022 Prepared by: Elease Etienne  Exercises - Standing Hip Abduction with Resistance at Ankles and Counter Support  - 1 x daily - 7 x weekly - 2 sets - 10 reps - Forward and Backward Monster Walk with Counter Support  - 1 x daily - 7 x weekly - 3 sets - 10 reps

## 2022-08-11 ENCOUNTER — Ambulatory Visit: Payer: Medicare HMO | Admitting: Physical Therapy

## 2022-08-11 ENCOUNTER — Other Ambulatory Visit: Payer: Self-pay

## 2022-08-11 ENCOUNTER — Ambulatory Visit: Payer: Medicare HMO | Attending: Physical Medicine & Rehabilitation

## 2022-08-11 ENCOUNTER — Ambulatory Visit: Payer: Medicare HMO | Admitting: Occupational Therapy

## 2022-08-11 ENCOUNTER — Telehealth: Payer: Self-pay | Admitting: Physical Medicine & Rehabilitation

## 2022-08-11 DIAGNOSIS — R41840 Attention and concentration deficit: Secondary | ICD-10-CM | POA: Insufficient documentation

## 2022-08-11 DIAGNOSIS — I69318 Other symptoms and signs involving cognitive functions following cerebral infarction: Secondary | ICD-10-CM

## 2022-08-11 DIAGNOSIS — I69254 Hemiplegia and hemiparesis following other nontraumatic intracranial hemorrhage affecting left non-dominant side: Secondary | ICD-10-CM

## 2022-08-11 DIAGNOSIS — R1312 Dysphagia, oropharyngeal phase: Secondary | ICD-10-CM | POA: Insufficient documentation

## 2022-08-11 DIAGNOSIS — R41841 Cognitive communication deficit: Secondary | ICD-10-CM | POA: Diagnosis present

## 2022-08-11 NOTE — Telephone Encounter (Signed)
Patient wife's called back and stated that she needs to plan her date of retirement so she can be home to take of patient but she needs to keep the dental insurance for the patient to get the tooth implant. She wants to know how long is too soon. Patient can be scheduled for the implant at the end of February and his wife is asking is that too soon.

## 2022-08-11 NOTE — Telephone Encounter (Signed)
Left wife a detailed message.

## 2022-08-11 NOTE — Therapy (Signed)
OUTPATIENT OCCUPATIONAL THERAPY NEURO EVALUATION  Patient Name: Douglas Cox MRN: [id number] DOB:[birth year], 72 y.o., male Today's Date: 08/11/2022  PCP: Dr. Lorrene Reid ([facility]) REFERRING PROVIDER: Erick Colace, MD  END OF SESSION:  OT End of Session - 08/11/22 1314     Visit Number 1    Number of Visits 15    Date for OT Re-Evaluation 10/08/22    Authorization Type [employer]    OT Start Time 1152    OT Stop Time 1235    OT Time Calculation (min) 43 min    Activity Tolerance Patient tolerated treatment well    Behavior During Therapy Flat affect;Restless             Past Medical History:  Diagnosis Date   Celiac disease    Retinal detachment    Past Surgical History:  Procedure Laterality Date   CATARACT EXTRACTION Bilateral    Dr. Cathey Endow   EYE SURGERY     FRACTURE SURGERY     GAS INSERTION Right 05/21/2021   Procedure: INSERTION OF GAS - C3F8;  Surgeon: Rennis Chris, MD;  Location: [facility] OR;  Service: Ophthalmology;  Laterality: Right;   GAS/FLUID EXCHANGE Right 05/21/2021   Procedure: GAS/FLUID EXCHANGE;  Surgeon: Rennis Chris, MD;  Location: [facility] OR;  Service: Ophthalmology;  Laterality: Right;   HERNIA REPAIR     IR CT HEAD LTD  07/17/2022   IR PERCUTANEOUS ART THROMBECTOMY/INFUSION INTRACRANIAL INC DIAG ANGIO  07/17/2022   IR US GUIDE VASC ACCESS RIGHT  07/17/2022   PARS PLANA VITRECTOMY Right 05/21/2021   Procedure: PARS PLANA VITRECTOMY WITH 25 GAUGE;  Surgeon: Rennis Chris, MD;  Location: [facility] OR;  Service: Ophthalmology;  Laterality: Right;   PERFLUORONE INJECTION Right 05/21/2021   Procedure: PERFLUORONE INJECTION;  Surgeon: Rennis Chris, MD;  Location: [facility] OR;  Service: Ophthalmology;  Laterality: Right;   PHOTOCOAGULATION WITH LASER Right 05/21/2021   Procedure: PHOTOCOAGULATION WITH LASER;  Surgeon: Rennis Chris, MD;  Location: [facility] OR;  Service: Ophthalmology;  Laterality: Right;   RADIOLOGY WITH ANESTHESIA N/A 07/17/2022   Procedure: IR  WITH ANESTHESIA;  Surgeon: Radiologist, Medication, MD;  Location: [facility] OR;  Service: Radiology;  Laterality: N/A;   RETINAL DETACHMENT SURGERY     Patient Active Problem List   Diagnosis Date Noted   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 (HCC) 07/22/2022   Protein-calorie malnutrition, severe 07/19/2022   Stroke (cerebrum) (HCC) 07/17/2022   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   Celiac disease    Alcohol dependence (HCC)    Bradycardia     ONSET DATE: 08/04/22 (referral date)  REFERRING DIAG: I63.81 (ICD-10-CM) - Other cerebral infarction due to occlusion or stenosis of small artery  THERAPY DIAG:  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  Pt reports that he has a wood shop but that he "can't do anything in it".  Pt also has a Architectural technologist that he is not going in anymore.  Spouse reports that these tasks may be overwhelming and he has not shown any interest.  Spouse reports thinking it may be more the sequencing than dexterity.  Pt has been able to follow diagrams and put things together. Pt  accompanied by: self and significant other (spouse)  PERTINENT HISTORY: 72 y.o. male with a PMHx of cataracts, celiac disease and retinal detachment who presents to the ED with acute onset of left hemiplegia, right gaze deviation and left hemineglect.MRI: Acute/subacute nonhemorrhagic infarct of the right caudate head and lentiform nucleus; acute/subacute nonhemorrhagic infarct of the left caudate head and lentiform nucleus spanning the internal capsule; Acute/subacute nonhemorrhagic  infarct of the cortex of the right temporal tip.  PRECAUTIONS: Fall  WEIGHT BEARING RESTRICTIONS: No  PAIN:  Are you having pain? Yes: NPRS scale: 6/10 Pain location: L lower back Pain description: aching, tender to touch Aggravating factors: certain movements Relieving factors: rest, repositioning  FALLS: Has patient fallen in last 6 months? Yes. Number of falls 2 - 1 when he had the stroke he fell forward and hit his head and fell off a ladder  LIVING ENVIRONMENT: Lives with: lives with their spouse Lives in: House/apartment Stairs: Yes: Internal: 16 steps; on right going up and External: 4 steps to front door, 16 steps from garage to main floor of home steps; on right going up Has following equipment at home: Single point cane  PLOF: Independent, working as an Radio producer  PATIENT GOALS: to improve thought process, speech  OBJECTIVE:   HAND DOMINANCE: Right  ADLs: Overall ADLs: Independent with all bathing/dressing.    IADLs: Shopping: Not interested in going to the store, but would have gone prior to CVA Light housekeeping: is able to assist with household tasks when spouse asks for assistance or directs him to complete Meal Prep: Spouse is proving supervision for meal prep, but has been able to cook fish Community mobility: MD has not cleared pt to drive Medication management: Spouse administers medication, is having difficulty with administering B12 shot Financial management: Spouse is currently paying bills, but pt would have completed prior to CVA   MOBILITY STATUS: Independent   UPPER EXTREMITY ROM:  WFL bilaterally, mild ataxia on LUE  UPPER EXTREMITY MMT:   WFL bilaterally, grossly 4+/5   COORDINATION: Finger Nose Finger test: mild ataxia, dysmetria LUE 9 Hole Peg test: Right: 32.37 sec; Left: 50.04 sec  Noted some difficulty with sequencing, requiring cues for recall and sequencing of directions Box and Blocks:  Right 38 blocks, Left 40 blocks (Pt frequently  picking up 2-3 blocks at a time despite mod-max cues to pick up only one block at a time.    SENSATION: WFL  COGNITION: Overall cognitive status: Impaired Orientation Level: Person;Place;Situation Person: Oriented Place: Disoriented Situation: Disoriented Memory: Impaired Memory Impairment: Storage deficit;Retrieval deficit;Decreased recall of new information;Decreased short term memory Attention: Focused Awareness: Impaired Awareness Impairment: Intellectual impairment Problem Solving: Impaired Problem Solving Impairment: Verbal basic;Functional basic Behaviors: Restless;Impulsive Safety/Judgment: Impaired  VISION: Baseline vision: Wears glasses all the time Visual history: cataracts and detached retina  OBSERVATIONS: Pt demonstrating attention at focused level during 9 hole peg test, requiring frequent cues and redirection to attend to 2 step task with Box and Blocks.  Pt demonstrating decreased interest and motivation during OT evaluation, however pt and spouse agreeable to continue with OT at this time to focus on memory and return to engagement in IADLs and leisure pursuits.  TODAY'S TREATMENT:  NA eval only  PATIENT EDUCATION: Education details: Educated on role and purpose of OT as well as potential interventions and goals for therapy based on initial evaluation findings. Person educated: Patient and Spouse Education method: Explanation Education comprehension: verbalized understanding and needs further education  HOME EXERCISE PROGRAM: TBD   GOALS: Goals reviewed with patient? No  SHORT TERM GOALS: Target date: 09/03/22  Pt will be able to identify/verbalize steps and/or items needed to complete simple functional task (simple snack prep, laundry task, etc) with min cues. Baseline: Goal status: INITIAL  2.  Patient will be able to maintain  selective attention for 2 mins during structured task without additional cues for attention.  Baseline:  Goal status: INITIAL  3.  Pt will complete table top scanning activity with min cues for technique Baseline:  Goal status: INITIAL  5.  Pt will be able to utilize phone to locate contacts and make emergency calls as needed without cues. Baseline:  Goal status: INITIAL   LONG TERM GOALS: Target date: 09/24/22  Pt will demonstrate ability to sequence simple functional task (simple snack prep, laundry task, etc) at Mod I level. Baseline:  Goal status: INITIAL  2.  Pt will navigate a moderately busy environment, following multi-step commands with 90% accuracy to simulate return to community reintegration. Baseline:  Goal status: INITIAL  3.  Pt will be able to utilize phone to look up phone number for physician and/or other frequently used number/location without cues. Baseline:  Goal status: INITIAL  4.  Pt will be able to verbalize wayfinding/directions from home to familiar locations in the area without cues. Baseline:  Goal status: INITIAL  5.  Pt will complete pill box assessment with <5 errors in 5 min time limit to demonstrate increased attention to task. Baseline:  Goal status: INITIAL  6.  Pt will complete MOCA and demonstrate improved cognitive function on MOCA by 3 points. Baseline: TBD Goal status: INITIAL  ASSESSMENT:  CLINICAL IMPRESSION: Patient is a 72 y.o. male who was seen today for occupational therapy evaluation for impairments s/p CVA. Pt presents to OT evaluation with impairments impacting his engagement in IADLs, largely due to impaired attention, awareness, and problem solving.  Pt requiring increased cues for attention when completing standardized assessments.  Pt currently lives with spouse in a 2 story home and works as an Radio producer, and enjoys working in his work shop prior to onset. Pt will benefit from skilled occupational therapy services to address  coordination, GM/FM control, cognition, safety awareness, introduction of compensatory strategies/AE prn,and implementation of an HEP to improve participation and safety during IADLs and leisure pursuits.   PERFORMANCE DEFICITS: in functional skills including IADLs, coordination, Fine motor control, Gross motor control, body mechanics, endurance, decreased knowledge of precautions, decreased knowledge of use of DME, and UE functional use, cognitive skills including attention, energy/drive, memory, orientation, problem solving, safety awareness, sequencing, and temperament/personality, and psychosocial skills including coping strategies, habits, and routines and behaviors.   IMPAIRMENTS: are limiting patient from IADLs and leisure.   CO-MORBIDITIES: may have co-morbidities  that affects occupational performance. Patient will benefit from skilled OT to address above impairments and improve overall function.  MODIFICATION OR ASSISTANCE TO COMPLETE EVALUATION: Min-Moderate modification of tasks or assist with assess necessary to complete an evaluation.  OT OCCUPATIONAL PROFILE AND HISTORY: Detailed assessment: Review of records and additional review of physical, cognitive, psychosocial history related to current functional performance.  CLINICAL DECISION MAKING: Moderate - several treatment options, min-mod task modification necessary  REHAB POTENTIAL: Good  EVALUATION COMPLEXITY: Moderate    PLAN:  OT FREQUENCY: 1-2x/week  OT DURATION: 6 weeks  PLANNED INTERVENTIONS: self care/ADL training, therapeutic exercise, therapeutic activity, balance training, functional mobility training, patient/family education, cognitive remediation/compensation, psychosocial skills training, energy conservation, coping strategies training, and DME and/or AE instructions  RECOMMENDED OTHER SERVICES: NA  CONSULTED AND AGREED WITH PLAN OF CARE: Patient and family member/caregiver  PLAN FOR NEXT SESSION: focused  and selective attention - use peg board with pattern, functional tasks with providing directions, making phone calls, preparing simple meal.  Complete pill box assessment.   Anastasio Wogan, OTR/L 08/11/2022, 1:16 PM

## 2022-08-11 NOTE — Therapy (Signed)
OUTPATIENT SPEECH LANGUAGE PATHOLOGY EVALUATION   Patient Name: Douglas Cox MRN: [id number] DOB:17-Dec-1950, 72 y.o., male Today's Date: 08/11/2022  PCP: None Recorded REFERRING PROVIDER: Alysia Penna, MD  END OF SESSION:  End of Session - 08/11/22 1712     Visit Number 1    Number of Visits 25    Date for SLP Re-Evaluation 11/09/22    SLP Start Time 1320    SLP Stop Time  1400    SLP Time Calculation (min) 40 min    Activity Tolerance Other (comment)   limited due to attention deficit            Past Medical History:  Diagnosis Date   Celiac disease    Retinal detachment    Past Surgical History:  Procedure Laterality Date   CATARACT EXTRACTION Bilateral    Dr. Valetta Close   EYE SURGERY     FRACTURE SURGERY     GAS INSERTION Right 05/21/2021   Procedure: INSERTION OF GAS - C3F8;  Surgeon: Bernarda Caffey, MD;  Location: [place];  Service: Ophthalmology;  Laterality: Right;   GAS/FLUID EXCHANGE Right 05/21/2021   Procedure: GAS/FLUID EXCHANGE;  Surgeon: Bernarda Caffey, MD;  Location: [place];  Service: Ophthalmology;  Laterality: Right;   HERNIA REPAIR     IR CT HEAD LTD  07/17/2022   IR PERCUTANEOUS ART THROMBECTOMY/INFUSION INTRACRANIAL INC DIAG ANGIO  07/17/2022   IR US GUIDE VASC ACCESS RIGHT  07/17/2022   PARS PLANA VITRECTOMY Right 05/21/2021   Procedure: PARS PLANA VITRECTOMY WITH 25 GAUGE;  Surgeon: Bernarda Caffey, MD;  Location: [place];  Service: Ophthalmology;  Laterality: Right;   PERFLUORONE INJECTION Right 05/21/2021   Procedure: PERFLUORONE INJECTION;  Surgeon: Bernarda Caffey, MD;  Location: [place];  Service: Ophthalmology;  Laterality: Right;   PHOTOCOAGULATION WITH LASER Right 05/21/2021   Procedure: PHOTOCOAGULATION WITH LASER;  Surgeon: Bernarda Caffey, MD;  Location: [place];  Service: Ophthalmology;  Laterality: Right;   RADIOLOGY WITH ANESTHESIA N/A 07/17/2022   Procedure: IR WITH ANESTHESIA;  Surgeon: Radiologist, Medication, MD;  Location: [place];   Service: Radiology;  Laterality: N/A;   RETINAL DETACHMENT SURGERY     Patient Active Problem List   Diagnosis Date Noted   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 ([place]) 07/22/2022   Protein-calorie malnutrition, severe 07/19/2022   Stroke (cerebrum) ([place]) 07/17/2022   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   Celiac disease    Alcohol dependence ([place])    Bradycardia     ONSET DATE: 07/17/22   REFERRING DIAG: I63.81 (ICD-10-CM) - Other cerebral infarction due to occlusion or stenosis of small artery  THERAPY DIAG:  Cognitive communication deficit  Oropharyngeal dysphagia  Rationale for Evaluation and Treatment: Rehabilitation  SUBJECTIVE:   SUBJECTIVE STATEMENT: "12-16." (Pt, re: date of his CVA, looking at cue he had written two minutes earlier with cue from wife to look at cue) (Wife) "I tried to hide the regular drinks but there have been a couple times I've caught him drinking something he shouldn't have. He's coughed." Pt accompanied by: significant other Douglas Rias (wife)  PERTINENT HISTORY: Monish presented to the ED with acute onset of left hemiplegia, right gaze deviation and left hemineglect while working in his garage.  TNK given and recanalization acheived with thrombectomy.  Later at 1800 noted right sided weakness and new CT Head and CTA head and neck reordered and negative for reocclusion. MRI today shows bilateral basal ganglia infarcts. ASA 81mg  started 12/17  Pt with ETT for thrombectomy and  overnight.  Extubated 12/17    PAIN:  Are you having pain? No  FALLS: Has patient fallen in last 6 months?  See PT evaluation for details  LIVING ENVIRONMENT: Lives with: lives with their spouse Lives in: House/apartment  PLOF:  Level of assistance: Independent with ADLs, Independent with IADLs Employment: Part-time employment  PATIENT GOALS: Pt did not answer question when asked, due to attention/processing deficits  OBJECTIVE:   DIAGNOSTIC FINDINGS:  FEES  07/30/22 Pt demonstrates a moderate oropharyngeal dysphagia that is impacted by patient's current cognitive deficits. Patient's oral phase is characterized by poor awareness of bolus resulting in inconsistent timing of swallowing trigger. With solids, patient needed verbal cues (snapping and counting) to attend and initiate a swallow with all boluses contained in the velleculae. With liquids, patient consistently triggered his swallow at the pyriform sinuses. This resulted in sensed aspiration of thin liquids via cup. Patient also with inconsistent amounts of pharyngeal residue that cleared with a 2nd subsequent swallow. No penetration or aspiration noted with nectar-thick liquids except for 1 episode of flash penetration. Provided thorough education to the patient's wife regarding patient's current swallowing function and that he is currently at a higher risk of aspiration due to cognitive deficits. She verbalized understanding and agreement with recommendations of continuing Dys. 3 textures but upgrading to nectar-thick liquids via cup with strict aspiration precautions and supervision. This will hopefully improve patient's intake of liquids with improved ability to stay hydrated without IV fluids prior to potential discharge on 08/03/22. SLP Diet Recommendations Dysphagia 3 (Mech soft) solids;Nectar thick liquid Liquid Administration via Cup;Spoon;No straw Medication Administration Crushed with puree Compensations: Slow rate;Small sips/bites;Minimize environmental distractions;Multiple dry swallows after each bite/sip  Today, pt ate dys III and drank nectar liqiuds. See "clinicial impressions" for results.  COGNITION: Overall cognitive status: Impaired Areas of impairment:  Attention: Impaired: Sustained, Selective, Alternating, Divided, Comment: longest attention SLP saw today in min noisy environment (therapy door closed with PT in adjacent gym) was approx 30-60 seconds.  Memory: Impaired:  Immediate Working Short term Long term Prospective Awareness: Impaired: Intellectual, Emergent, Anticipatory, and Comment: affected largely by attention deficit Executive function: Impaired: Initiation, Impulse control, Problem solving, Organization, Planning, Error awareness, Self-correction, Slow processing, and Comment: greatly afffected by attention deficits. SLP asked pt simple questions which took pt >5 seconds to answer during evaluation. Behavior: Restless and poor eye contact  COGNITIVE COMMUNICATION: Following directions: Follows one step commands inconsistently  Auditory comprehension: Impaired: vs. Overall processing deficit due to decr'd attention Verbal expression: WFL Functional communication: Impaired: due to decr'd attention  STANDARDIZED ASSESSMENTS: CLQT: may need to be completed in first 1-3 sessions  PATIENT REPORTED OUTCOME MEASURES (PROM): Cognitive function: Short Form: to be administered in first 1-3 sessions   TODAY'S TREATMENT:  DATE:  08/11/22 (eval): SLP discussed deficits in attention and highlighted it as being pt's most significant deficit area. SLP educated wife re: how to cue pt for swallowing/meals. Assured Douglas Cox multiple cues during a meal is necessary at this time due to poor sustained/selective attention. "I have to tell him a lot," she stated. After pt told SLP he didn't know when his CVA was, SLP ahd him write the date on paper in front of him. Two minutes later pt req'd cue to look at paper to answer same question. In subsequent trials pt looked at paper independently, 4 minutes later, 5 minutes later, and 8 minutes later. SLP shared with Douglas Cox that repetition may be beneficial for pt's memory, and encouraged her to cont to cue pt for small sips and double swallows.    PATIENT EDUCATION: Education details: see "today's  treatment" Person educated: Patient and Spouse Education method: Explanation and Demonstration Education comprehension: verbalized understanding and needs further education   GOALS: Goals reviewed with patient? No  SHORT TERM GOALS: Target date: 09/10/22  Pt will follow aspiration precautions from FEES with occasional min A in 2 sessions Baseline: Goal status: INITIAL  2.  [place]son will demo sustained attention to participate in 10 minute therapy task x3/session, in 3 sessions Baseline:  Goal status: INITIAL  3.  Pt will demo understanding of written cues in simple therapy tasks, with initial cue allowed, in 3 sessions Baseline:  Goal status: INITIAL  4.  Pt will demo selective attention for 10 minutes to a simple therapy task in a min-mod noisy environment Baseline:  Goal status: INITIAL  5.  Pt/wife will tell SLP 3 overt s/sx aspiration PNA with modified independence Baseline:  Goal status: INITIAL  6   Using compensations, pt will ask wife to give him his medications, at least once/day between 3 sessions Baseline:  Goal status: INITIAL   LONG TERM GOALS: Target date: 11/09/22  Pt will follow aspiration precautions from FEES with rare min A in 3 sessions Baseline:  Goal status: INITIAL  2.  Pt will follow aspiration precautions from FEES with modified independence in 2 sessions Baseline:  Goal status: INITIAL  3.  Pt will demo selective attention for 20 minutes to a simple therapy task in a min-mod noisy environment Baseline:  Goal status: INITIAL  4.  Pt will demo emergent awareness in simple therapy tasks with verbal cue for error awareness in 3 sessions Baseline:  Goal status: INITIAL  5.  Pt will demo alternating attention for 20 minutes to complete simple-mod complex therapy tasks 100% accuracy in 3 sessions Baseline:  Goal status: INITIAL  6.  Using compensations, pt will recall to administer his own medications correctly, at least once/day between 3  sessions Baseline:  Goal status: INITIAL  7.  Wife's PROM (due to pt's attention deficit) score for pt will improve in last 1-2 sessions than the score taken in the first 1-2 sessions Baseline:  Goal status: INITIAL  ASSESSMENT:  CLINICAL IMPRESSION: Patient is a 72 y.o. male who was seen today for assessment of simple cognition (primarily attention) and for assessment of swallowing skills in light of CVA 07-17-22. Currently, pt was able to hold to precaution of small sips, and double swallows with usual min A. With small bites, pt req'd occasional min A. No coughing was noted. SLP instructed Douglas Cox to cont to cue pt at mealtimes as necessary. SLP also encouraged wife to purchase nectar liquids and keep where pt could access. Pt needed Pt  did not demo sustained attention longer than 60 seconds today.    OBJECTIVE IMPAIRMENTS: include attention, memory, awareness, executive functioning, receptive language, and dysphagia. These impairments are limiting patient from return to work, managing medications, managing appointments, managing finances, household responsibilities, ADLs/IADLs, effectively communicating at home and in community, and safety when swallowing. Factors affecting potential to achieve goals and functional outcome are ability to learn/carryover information, cooperation/participation level, and severity of impairments.. Patient will benefit from skilled SLP services to address above impairments and improve overall function.  REHAB POTENTIAL: Good  PLAN:  SLP FREQUENCY: 2x/week  SLP DURATION: 12 weeks  PLANNED INTERVENTIONS: Aspiration precaution training, Pharyngeal strengthening exercises, Diet toleration management , Environmental controls, Trials of upgraded texture/liquids, Cueing hierachy, Cognitive reorganization, Internal/external aids, Multimodal communication approach, SLP instruction and feedback, Compensatory strategies, and Patient/family education    [employer],  [place] 08/11/2022, 5:13 PM

## 2022-08-11 NOTE — Telephone Encounter (Signed)
Patient's wife is calling to find out if patient can have a tooth implant under sedation.  Please call her at [phone].

## 2022-08-12 ENCOUNTER — Encounter: Payer: Medicare HMO | Admitting: Occupational Therapy

## 2022-08-12 NOTE — Telephone Encounter (Signed)
Wife has been notified

## 2022-08-17 ENCOUNTER — Ambulatory Visit: Payer: Medicare HMO

## 2022-08-17 ENCOUNTER — Ambulatory Visit: Payer: Medicare HMO | Admitting: Occupational Therapy

## 2022-08-17 DIAGNOSIS — I69318 Other symptoms and signs involving cognitive functions following cerebral infarction: Secondary | ICD-10-CM | POA: Diagnosis not present

## 2022-08-17 DIAGNOSIS — R1312 Dysphagia, oropharyngeal phase: Secondary | ICD-10-CM

## 2022-08-17 DIAGNOSIS — R41840 Attention and concentration deficit: Secondary | ICD-10-CM

## 2022-08-17 DIAGNOSIS — R41841 Cognitive communication deficit: Secondary | ICD-10-CM

## 2022-08-17 NOTE — Therapy (Signed)
OUTPATIENT SPEECH LANGUAGE PATHOLOGY TREATMENT   Patient Name: Douglas Cox MRN: [id number] DOB:17-May-1951, 72 y.o., male Today's Date: 08/17/2022  PCP: None Recorded REFERRING PROVIDER: Alysia Penna, Cox  END OF SESSION:  End of Session - 08/17/22 1655     Visit Number 2    Number of Visits 25    Date for SLP Re-Evaluation 11/09/22    SLP Start Time 1533    SLP Stop Time  1615    SLP Time Calculation (min) 42 min    Activity Tolerance Other (comment)   limited due to attention deficit             Past Medical History:  Diagnosis Date   Celiac disease    Retinal detachment    Past Surgical History:  Procedure Laterality Date   CATARACT EXTRACTION Bilateral    Dr. Valetta Cox   EYE SURGERY     FRACTURE SURGERY     GAS INSERTION Right 05/21/2021   Procedure: INSERTION OF GAS - C3F8;  Surgeon: Douglas Caffey, Cox;  Location: [facility];  Service: Ophthalmology;  Laterality: Right;   GAS/FLUID EXCHANGE Right 05/21/2021   Procedure: GAS/FLUID EXCHANGE;  Surgeon: Douglas Caffey, Cox;  Location: [facility];  Service: Ophthalmology;  Laterality: Right;   HERNIA REPAIR     IR CT HEAD LTD  07/17/2022   IR PERCUTANEOUS ART THROMBECTOMY/INFUSION INTRACRANIAL INC DIAG ANGIO  07/17/2022   IR US GUIDE VASC ACCESS RIGHT  07/17/2022   PARS PLANA VITRECTOMY Right 05/21/2021   Procedure: PARS PLANA VITRECTOMY WITH 25 GAUGE;  Surgeon: Douglas Caffey, Cox;  Location: [facility];  Service: Ophthalmology;  Laterality: Right;   PERFLUORONE INJECTION Right 05/21/2021   Procedure: PERFLUORONE INJECTION;  Surgeon: Douglas Caffey, Cox;  Location: [facility];  Service: Ophthalmology;  Laterality: Right;   PHOTOCOAGULATION WITH LASER Right 05/21/2021   Procedure: PHOTOCOAGULATION WITH LASER;  Surgeon: Douglas Caffey, Cox;  Location: [facility];  Service: Ophthalmology;  Laterality: Right;   RADIOLOGY WITH ANESTHESIA N/A 07/17/2022   Procedure: IR WITH ANESTHESIA;  Surgeon: Douglas Cox;  Location: [facility];   Service: Radiology;  Laterality: N/A;   RETINAL DETACHMENT SURGERY     Patient Active Problem List   Diagnosis Date Noted   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 ([place]) 07/22/2022   Protein-calorie malnutrition, severe 07/19/2022   Stroke (cerebrum) ([place]) 07/17/2022   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   Celiac disease    Alcohol dependence ([place])    Bradycardia     ONSET DATE: 07/17/22   REFERRING DIAG: I63.81 (ICD-10-CM) - Other cerebral infarction due to occlusion or stenosis of small artery  THERAPY DIAG:  Cognitive communication deficit  Oropharyngeal dysphagia  Rationale for Evaluation and Treatment: Rehabilitation  SUBJECTIVE:   SUBJECTIVE STATEMENT: ""Will I get past this?" Pt accompanied by: significant other [name] (wife)  PERTINENT HISTORY: [name] presented to the ED with acute onset of left hemiplegia, right gaze deviation and left hemineglect while working in his garage.  TNK given and recanalization acheived with thrombectomy.  Later at 1800 noted right sided weakness and new CT Head and CTA head and neck reordered and negative for reocclusion. MRI today shows bilateral basal ganglia infarcts. ASA 81mg  started 12/17  Pt with ETT for thrombectomy and overnight.  Extubated 12/17    PAIN:  Are you having pain? Yes: NPRS scale: 8/10 Pain location: lt back top teeth Pain description: sore    PATIENT GOALS: Pt did not answer question when asked, due to attention/processing deficits  OBJECTIVE:   DIAGNOSTIC FINDINGS:  FEES 07/30/22 Pt demonstrates a moderate oropharyngeal dysphagia that is impacted by patient's current cognitive deficits. Patient's oral phase is characterized by poor awareness of bolus resulting in inconsistent timing of swallowing trigger. With solids, patient needed verbal cues (snapping and counting) to attend and initiate a swallow with all boluses contained in the velleculae. With liquids, patient consistently triggered his swallow at the  pyriform sinuses. This resulted in sensed aspiration of thin liquids via cup. Patient also with inconsistent amounts of pharyngeal residue that cleared with a 2nd subsequent swallow. No penetration or aspiration noted with nectar-thick liquids except for 1 episode of flash penetration. Provided thorough education to the patient's wife regarding patient's current swallowing function and that he is currently at a higher risk of aspiration due to cognitive deficits. She verbalized understanding and agreement with recommendations of continuing Dys. 3 textures but upgrading to nectar-thick liquids via cup with strict aspiration precautions and supervision. This will hopefully improve patient's intake of liquids with improved ability to stay hydrated without IV fluids prior to potential discharge on 08/03/22. SLP Diet Recommendations Dysphagia 3 (Mech soft) solids;Nectar thick liquid Liquid Administration via Cup;Spoon;No straw Medication Administration Crushed with puree Compensations: Slow rate;Small sips/bites;Minimize environmental distractions;Multiple dry swallows after each bite/sip  Today, pt ate dys III and drank nectar liqiuds. See "clinicial impressions" for results.  STANDARDIZED ASSESSMENTS: CLQT: may need to be completed in first 1-3 sessions  PATIENT REPORTED OUTCOME MEASURES (PROM): Cognitive function: Short Form: to be administered in first 1-3 sessions   TODAY'S TREATMENT:                                                                                                                                         DATE:  08/17/22: Swallowing: Discussion today on rationale for thickened liquids to reinforce to pt to remain on nectar thick liquids due to risk of aspiration. Educated on why wet voice was necessary to clear throat/cough and reswallow. SLP then worked with pt on a strong throat clear or cough and pt demonstrated x2/3 attempts. This did not carry over unless cued by SLP that pt had  "wet" voice. Pt unsuccessfully attempted throat clear or cough without a demo by SLP. Wife stated she needs to demo that more at home instead of just verbally cue. SLP confirmed this.  Speech: Sustained attention noted for up to 90 seconds with route finding with visual support. Without visual support pt consistently with <60 seconds attention. Education on attention tasks to do at home, work in 15 minute increments (at least 6/day), signs that a break is necessary, and that OT and ST are to utilize neuroplasticity - SLP defined neuroplasticity for pt and wife.  08/11/22 (eval): SLP discussed deficits in attention and highlighted it as being pt's most significant deficit area. SLP educated wife re: how to cue pt for swallowing/meals. Assured Purnell Shoemaker multiple cues  during a meal is necessary at this time due to poor sustained/selective attention. "I have to tell him a lot," she stated. After pt told SLP he didn't know when his CVA was, SLP ahd him write the date on paper in front of him. Two minutes later pt req'd cue to look at paper to answer same question. In subsequent trials pt looked at paper independently, 4 minutes later, 5 minutes later, and 8 minutes later. SLP shared with [name] that repetition may be beneficial for pt's memory, and encouraged her to cont to cue pt for small sips and double swallows.    PATIENT EDUCATION: Education details: see "today's treatment" Person educated: Patient and Spouse Education method: Explanation and Demonstration Education comprehension: verbalized understanding and needs further education   GOALS: Goals reviewed with patient? No  SHORT TERM GOALS: Target date: 09/10/22  Pt will follow aspiration precautions from FEES with occasional min A in 2 sessions Baseline: Goal status: Ongoing  2.  Angeldejesus will demo sustained attention to participate in 10 minute therapy task x3/session, in 3 sessions Baseline:  Goal status: Ongoing  3.  Pt will demo understanding of  written cues in simple therapy tasks, with initial cue allowed, in 3 sessions Baseline:  Goal status: Ongoing  4.  Pt will demo selective attention for 10 minutes to a simple therapy task in a min-mod noisy environment Baseline:  Goal status: Ongoing  5.  Pt/wife will tell SLP 3 overt s/sx aspiration PNA with modified independence Baseline:  Goal status: Ongoing  6   Using compensations, pt will ask wife to give him his medications, at least once/day between 3 sessions Baseline:  Goal status: Ongoing   LONG TERM GOALS: Target date: 11/09/22  Pt will follow aspiration precautions from FEES with rare min A in 3 sessions Baseline:  Goal status: Ongoing  2.  Pt will follow aspiration precautions from FEES with modified independence in 2 sessions Baseline:  Goal status: Ongoing  3.  Pt will demo selective attention for 20 minutes to a simple therapy task in a min-mod noisy environment Baseline:  Goal status: Ongoing  4.  Pt will demo emergent awareness in simple therapy tasks with verbal cue for error awareness in 3 sessions Baseline:  Goal status: Ongoing  5.  Pt will demo alternating attention for 20 minutes to complete simple-mod complex therapy tasks 100% accuracy in 3 sessions Baseline:  Goal status: Ongoing  6.  Using compensations, pt will recall to administer his own medications correctly, at least once/day between 3 sessions Baseline:  Goal status: Ongoing  7.  Wife's PROM (due to pt's attention deficit) score for pt will improve in last 1-2 sessions than the score taken in the first 1-2 sessions Baseline:  Goal status: Ongoing  ASSESSMENT:  CLINICAL IMPRESSION: Patient is a 72 y.o. male who was seen today for treatment of simple cognition (primarily attention) and for swallowing skills in light of CVA 07-17-22. Sustained attention noted for up to 90 seconds with route finding with visual support. Pt has had thin liquids when getting up mid-morning (3am). He has  demo'd distaste for NTL telling wife in the AM once since eval, "I'm not drinking that shit anymore." Wife cont to push smoothies during dinner and uses this technique to get meds in pt.  OBJECTIVE IMPAIRMENTS: include attention, memory, awareness, executive functioning, receptive language, and dysphagia. These impairments are limiting patient from return to work, managing medications, managing appointments, managing finances, household responsibilities, ADLs/IADLs, effectively communicating at  home and in community, and safety when swallowing. Factors affecting potential to achieve goals and functional outcome are ability to learn/carryover information, cooperation/participation level, and severity of impairments.. Patient will benefit from skilled SLP services to address above impairments and improve overall function.  REHAB POTENTIAL: Good  PLAN:  SLP FREQUENCY: 2x/week  SLP DURATION: 12 weeks  PLANNED INTERVENTIONS: Aspiration precaution training, Pharyngeal strengthening exercises, Diet toleration management , Environmental controls, Trials of upgraded texture/liquids, Cueing hierachy, Cognitive reorganization, Internal/external aids, Multimodal communication approach, SLP instruction and feedback, Compensatory strategies, and Patient/family education    [facility], CCC-SLP 08/17/2022, 4:55 PM

## 2022-08-17 NOTE — Therapy (Signed)
OUTPATIENT OCCUPATIONAL THERAPY  Treatment Note  Patient Name: Douglas Cox MRN: [id number] DOB:January 11, 1951, 72 y.o., male Today's Date: 08/17/2022  PCP: Dr. Zara Council ([facility]) REFERRING PROVIDER: Charlett Blake, MD  END OF SESSION:  OT End of Session - 08/17/22 1413     Visit Number 2    Number of Visits 15    Date for OT Re-Evaluation 10/08/22    Authorization Type [employer]    OT Start Time 1320    OT Stop Time 1405    OT Time Calculation (min) 45 min    Activity Tolerance Patient tolerated treatment well    Behavior During Therapy Flat affect;Restless              Past Medical History:  Diagnosis Date   Celiac disease    Retinal detachment    Past Surgical History:  Procedure Laterality Date   CATARACT EXTRACTION Bilateral    Dr. Valetta Close   EYE SURGERY     FRACTURE SURGERY     GAS INSERTION Right 05/21/2021   Procedure: INSERTION OF GAS - C3F8;  Surgeon: Bernarda Caffey, MD;  Location: [facility];  Service: Ophthalmology;  Laterality: Right;   GAS/FLUID EXCHANGE Right 05/21/2021   Procedure: GAS/FLUID EXCHANGE;  Surgeon: Bernarda Caffey, MD;  Location: [facility];  Service: Ophthalmology;  Laterality: Right;   HERNIA REPAIR     IR CT HEAD LTD  07/17/2022   IR PERCUTANEOUS ART THROMBECTOMY/INFUSION INTRACRANIAL INC DIAG ANGIO  07/17/2022   IR US GUIDE VASC ACCESS RIGHT  07/17/2022   PARS PLANA VITRECTOMY Right 05/21/2021   Procedure: PARS PLANA VITRECTOMY WITH 25 GAUGE;  Surgeon: Bernarda Caffey, MD;  Location: [facility];  Service: Ophthalmology;  Laterality: Right;   PERFLUORONE INJECTION Right 05/21/2021   Procedure: PERFLUORONE INJECTION;  Surgeon: Bernarda Caffey, MD;  Location: [facility];  Service: Ophthalmology;  Laterality: Right;   PHOTOCOAGULATION WITH LASER Right 05/21/2021   Procedure: PHOTOCOAGULATION WITH LASER;  Surgeon: Bernarda Caffey, MD;  Location: [facility];  Service: Ophthalmology;  Laterality: Right;   RADIOLOGY WITH ANESTHESIA N/A 07/17/2022   Procedure:  IR WITH ANESTHESIA;  Surgeon: Radiologist, Medication, MD;  Location: [facility];  Service: Radiology;  Laterality: N/A;   RETINAL DETACHMENT SURGERY     Patient Active Problem List   Diagnosis Date Noted   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 ([place]) 07/22/2022   Protein-calorie malnutrition, severe 07/19/2022   Stroke (cerebrum) ([place]) 07/17/2022   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   Celiac disease    Alcohol dependence ([place])    Bradycardia     ONSET DATE: 08/04/22 (referral date)  REFERRING DIAG: I63.81 (ICD-10-CM) - Other cerebral infarction due to occlusion or stenosis of small artery  THERAPY DIAG:  Other symptoms and signs involving cognitive functions following cerebral infarction  Attention and concentration deficit  Rationale for Evaluation and Treatment: Rehabilitation  SUBJECTIVE:   SUBJECTIVE STATEMENT: "My wife is making me crazy." Pt accompanied by: self and significant other (spouse)  PERTINENT HISTORY: 72 y.o. male with a PMHx of cataracts, celiac disease and retinal detachment who presents to the ED with acute onset of left hemiplegia, right gaze deviation and left hemineglect.MRI: Acute/subacute nonhemorrhagic infarct of the right caudate head and lentiform nucleus; acute/subacute nonhemorrhagic infarct of the left caudate head and lentiform nucleus spanning the internal capsule; Acute/subacute nonhemorrhagic infarct of the cortex of the right temporal tip.  PRECAUTIONS: Fall  WEIGHT BEARING RESTRICTIONS: No  PAIN:  Are you having pain? No  FALLS: Has patient  fallen in last 6 months? Yes. Number of falls 2 - 1 when he had the stroke he fell forward and hit his head and fell off a ladder  LIVING ENVIRONMENT: Lives with: lives with their spouse Lives in: House/apartment Stairs: Yes: Internal: 16 steps; on right going up and External: 4 steps to front door, 16 steps from garage to main floor of home steps; on right going up Has following equipment at  home: Single point cane  PLOF: Independent, working as an Radio producer  PATIENT GOALS: to improve thought process, speech  OBJECTIVE:   HAND DOMINANCE: Right  ADLs: Overall ADLs: Independent with all bathing/dressing.    IADLs: Shopping: Not interested in going to the store, but would have gone prior to CVA Light housekeeping: is able to assist with household tasks when spouse asks for assistance or directs him to complete Meal Prep: Spouse is proving supervision for meal prep, but has been able to cook fish Community mobility: MD has not cleared pt to drive Medication management: Spouse administers medication, is having difficulty with administering B12 shot Financial management: Spouse is currently paying bills, but pt would have completed prior to CVA   COORDINATION: Finger Nose Finger test: mild ataxia, dysmetria LUE 9 Hole Peg test: Right: 32.37 sec; Left: 50.04 sec  Noted some difficulty with sequencing, requiring cues for recall and sequencing of directions Box and Blocks:  Right 38 blocks, Left 40 blocks (Pt frequently picking up 2-3 blocks at a time despite mod-max cues to pick up only one block at a time.    COGNITION: Overall cognitive status: Impaired Orientation Level: Person;Place;Situation Person: Oriented Place: Disoriented Situation: Disoriented Memory: Impaired Memory Impairment: Storage deficit;Retrieval deficit;Decreased recall of new information;Decreased short term memory Attention: Focused Awareness: Impaired Awareness Impairment: Intellectual impairment Problem Solving: Impaired Problem Solving Impairment: Verbal basic;Functional basic Behaviors: Restless;Impulsive Safety/Judgment: Impaired  OBSERVATIONS: Pt demonstrating attention at focused level during 9 hole peg test, requiring frequent cues and redirection to attend to 2 step task with Box and Blocks.  Pt demonstrating decreased interest and motivation during OT evaluation, however pt and spouse  agreeable to continue with OT at this time to focus on memory and return to engagement in IADLs and leisure pursuits.  TODAY'S TREATMENT:                                                                                                                              08/17/22 Trail Making A and B:  pt completed Trail making A in 1:06.69 sec with 1 error connecting 12-14.  Pt able to correct without cues.  Pt unable to complete Trail making B without mod-max cues for sequencing of alternating sequence.  Pt initially able to correct errors below number 5E, but afterwards pt would complete with no semblance of sequence.  Pt's spouse asking if he should do this task at home, discussed functional carryover with alternating attention with TV on or completing task while carrying on  conversation. Memory games: engaged in naming activity with naming items by category to challenge thinking skills, attention, following directions, sequencing, and memory.  Pt requiring increased time to identify items in category, even when given familiar category.  Discussed game options such as Tapple, Guess in 10, Hang man, "I'm going on a trip..." as well as modifications to games to complete without need to purchase above games.   Educated on ways to modify games to increase/decrease challenge and how to increase from sustained to alternating to divided attention as able.    PATIENT EDUCATION: Education details: Educated on memory games to complete at home. Person educated: Patient and Spouse Education method: Explanation Education comprehension: verbalized understanding and needs further education  HOME EXERCISE PROGRAM: TBD   GOALS: Goals reviewed with patient? Yes  SHORT TERM GOALS: Target date: 09/03/22  Pt will be able to identify/verbalize steps and/or items needed to complete simple functional task (simple snack prep, laundry task, etc) with min cues. Baseline: Goal status: IN PROGRESS  2.  Patient will be able to  maintain selective attention for 2 mins during structured task without additional cues for attention.  Baseline:  Goal status: IN PROGRESS  3.  Pt will complete table top scanning activity with min cues for technique Baseline:  Goal status: IN PROGRESS  5.  Pt will be able to utilize phone to locate contacts and make emergency calls as needed without cues. Baseline:  Goal status: IN PROGRESS   LONG TERM GOALS: Target date: 09/24/22  Pt will demonstrate ability to sequence simple functional task (simple snack prep, laundry task, etc) at Mod I level. Baseline:  Goal status: IN PROGRESS  2.  Pt will navigate a moderately busy environment, following multi-step commands with 90% accuracy to simulate return to community reintegration. Baseline:  Goal status: IN PROGRESS  3.  Pt will be able to utilize phone to look up phone number for physician and/or other frequently used number/location without cues. Baseline:  Goal status: IN PROGRESS  4.  Pt will be able to verbalize wayfinding/directions from home to familiar locations in the area without cues. Baseline:  Goal status: IN PROGRESS  5.  Pt will complete pill box assessment with <5 errors in 5 min time limit to demonstrate increased attention to task. Baseline:  Goal status: IN PROGRESS  6.  Pt will complete MOCA and demonstrate improved cognitive function on MOCA by 3 points. Baseline: TBD Goal status: IN PROGRESS  ASSESSMENT:  CLINICAL IMPRESSION: Pt demonstrating mild improvements with sustained attention to task, with ability to attend for ~1 minute to complete Trail Making A but decreased attention with alternating attention for Trail B.  Pt demonstrating decreased attention during memory games and difficulty with recall of words even with familiar categories.  Pt's spouse receptive to education on how to increase/decrease challenge and to engage in tasks that are/were familiar in nature to assess engagement and  success.  PERFORMANCE DEFICITS: in functional skills including IADLs, coordination, Fine motor control, Gross motor control, body mechanics, endurance, decreased knowledge of precautions, decreased knowledge of use of DME, and UE functional use, cognitive skills including attention, energy/drive, memory, orientation, problem solving, safety awareness, sequencing, and temperament/personality, and psychosocial skills including coping strategies, habits, and routines and behaviors.   IMPAIRMENTS: are limiting patient from IADLs and leisure.   CO-MORBIDITIES: may have co-morbidities  that affects occupational performance. Patient will benefit from skilled OT to address above impairments and improve overall function.  MODIFICATION OR ASSISTANCE TO COMPLETE EVALUATION:  Min-Moderate modification of tasks or assist with assess necessary to complete an evaluation.  OT OCCUPATIONAL PROFILE AND HISTORY: Detailed assessment: Review of records and additional review of physical, cognitive, psychosocial history related to current functional performance.  CLINICAL DECISION MAKING: Moderate - several treatment options, min-mod task modification necessary  REHAB POTENTIAL: Good  EVALUATION COMPLEXITY: Moderate    PLAN:  OT FREQUENCY: 1-2x/week  OT DURATION: 6 weeks  PLANNED INTERVENTIONS: self care/ADL training, therapeutic exercise, therapeutic activity, balance training, functional mobility training, patient/family education, cognitive remediation/compensation, psychosocial skills training, energy conservation, coping strategies training, and DME and/or AE instructions  RECOMMENDED OTHER SERVICES: NA  CONSULTED AND AGREED WITH PLAN OF CARE: Patient and family member/caregiver  PLAN FOR NEXT SESSION: focused and selective attention - use peg board with pattern, functional tasks with providing directions, making phone calls, preparing simple meal.  Complete pill box assessment.  Tapple or Guess in [place], [place], OTR/L 08/17/2022, 2:14 PM

## 2022-08-18 ENCOUNTER — Ambulatory Visit: Payer: Medicare HMO | Admitting: Physical Therapy

## 2022-08-19 ENCOUNTER — Ambulatory Visit: Payer: Medicare HMO

## 2022-08-19 DIAGNOSIS — R41841 Cognitive communication deficit: Secondary | ICD-10-CM | POA: Diagnosis not present

## 2022-08-19 DIAGNOSIS — R1312 Dysphagia, oropharyngeal phase: Secondary | ICD-10-CM

## 2022-08-19 NOTE — Addendum Note (Signed)
Encounter addended by: Markus Daft A on: 08/19/2022 12:06 PM  Actions taken: Imaging Exam ended

## 2022-08-19 NOTE — Therapy (Signed)
OUTPATIENT SPEECH LANGUAGE PATHOLOGY TREATMENT   Patient Name: Douglas Cox MRN: [id number] DOB:[birth year], 72 y.o., male Today's Date: 08/19/2022  PCP: None Recorded REFERRING PROVIDER: Alysia Penna, MD  END OF SESSION:  End of Session - 08/19/22 1623     Visit Number 3    Number of Visits 25    Date for SLP Re-Evaluation 11/09/22    SLP Start Time 1537    SLP Stop Time  1612    SLP Time Calculation (min) 35 min    Activity Tolerance Other (comment)   limited somewhat by severity of attention deficit             Past Medical History:  Diagnosis Date   Celiac disease    Retinal detachment    Past Surgical History:  Procedure Laterality Date   CATARACT EXTRACTION Bilateral    Dr. Valetta Close   EYE SURGERY     FRACTURE SURGERY     GAS INSERTION Right 05/21/2021   Procedure: INSERTION OF GAS - C3F8;  Surgeon: Bernarda Caffey, MD;  Location: [place];  Service: Ophthalmology;  Laterality: Right;   GAS/FLUID EXCHANGE Right 05/21/2021   Procedure: GAS/FLUID EXCHANGE;  Surgeon: Bernarda Caffey, MD;  Location: [facility];  Service: Ophthalmology;  Laterality: Right;   HERNIA REPAIR     IR CT HEAD LTD  07/17/2022   IR PERCUTANEOUS ART THROMBECTOMY/INFUSION INTRACRANIAL INC DIAG ANGIO  07/17/2022   IR US GUIDE VASC ACCESS RIGHT  07/17/2022   PARS PLANA VITRECTOMY Right 05/21/2021   Procedure: PARS PLANA VITRECTOMY WITH 25 GAUGE;  Surgeon: Bernarda Caffey, MD;  Location: [place];  Service: Ophthalmology;  Laterality: Right;   PERFLUORONE INJECTION Right 05/21/2021   Procedure: PERFLUORONE INJECTION;  Surgeon: Bernarda Caffey, MD;  Location: [place];  Service: Ophthalmology;  Laterality: Right;   PHOTOCOAGULATION WITH LASER Right 05/21/2021   Procedure: PHOTOCOAGULATION WITH LASER;  Surgeon: Bernarda Caffey, MD;  Location: [place] [facility] [place];  Service: Ophthalmology;  Laterality: Right;   RADIOLOGY WITH ANESTHESIA N/A 07/17/2022   Procedure: IR WITH ANESTHESIA;  Surgeon: Radiologist, Medication, MD;   Location: [facility];  Service: Radiology;  Laterality: N/A;   RETINAL DETACHMENT SURGERY     Patient Active Problem List   Diagnosis Date Noted   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 ([place]) 07/22/2022   Protein-calorie malnutrition, severe 07/19/2022   Stroke (cerebrum) ([place]) 07/17/2022   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   Celiac disease    Alcohol dependence (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  TNK given and recanalization acheived with thrombectomy.  Later at 1800 noted right sided weakness and new CT Head and CTA head and neck reordered and negative for reocclusion. MRI today shows bilateral basal ganglia infarcts. ASA 81mg  started 12/17  Pt with ETT for thrombectomy and overnight.  Extubated 12/17    PAIN:  Are you having pain? Yes: NPRS scale: 8/10 Pain location: lt back top teeth Pain description: sore    PATIENT GOALS: Pt did not answer question when asked, due to attention/processing  deficits  OBJECTIVE:   DIAGNOSTIC FINDINGS:  FEES 07/30/22 Pt demonstrates a moderate oropharyngeal dysphagia that is impacted by patient's current cognitive deficits. Patient's oral phase is characterized by poor awareness of bolus resulting in inconsistent timing of swallowing trigger. With solids, patient needed verbal cues (snapping and counting) to attend and initiate a swallow with all boluses contained in the velleculae. With liquids, patient consistently triggered  his swallow at the pyriform sinuses. This resulted in sensed aspiration of thin liquids via cup. Patient also with inconsistent amounts of pharyngeal residue that cleared with a 2nd subsequent swallow. No penetration or aspiration noted with nectar-thick liquids except for 1 episode of flash penetration. Provided thorough education to the patient's wife regarding patient's current swallowing function and that he is currently at a higher risk of aspiration due to cognitive deficits. She verbalized understanding and agreement with recommendations of continuing Dys. 3 textures but upgrading to nectar-thick liquids via cup with strict aspiration precautions and supervision. This will hopefully improve patient's intake of liquids with improved ability to stay hydrated without IV fluids prior to potential discharge on 08/03/22. SLP Diet Recommendations Dysphagia 3 (Mech soft) solids;Nectar thick liquid Liquid Administration via Cup;Spoon;No straw Medication Administration Crushed with puree Compensations: Slow rate;Small sips/bites;Minimize environmental distractions;Multiple dry swallows after each bite/sip  Today, pt ate dys III and drank nectar liqiuds. See "clinicial impressions" for results.  STANDARDIZED ASSESSMENTS: CLQT: may need to be completed in first 1-3 sessions  PATIENT REPORTED OUTCOME MEASURES (PROM): Cognitive function: Short Form: to be administered in first 1-3 sessions   TODAY'S TREATMENT:                                                                                                                                         DATE:  08/19/22:SLP targeted pt's attention in session today. He made poached egg so SLP asked for steps for making poached egg. Pt req'd occasional mod cues back to task with SLP using written cues. Pt expressive language is slow due to decr'd linguistic processing skills/decr'd attention.  SLP asked what pt had done in last 24 hours and req'd usual max A other than  "watched [place]." SLP told pt/wife that </= 2 or 2 1/2 hours of TV a day are ok, but pt needs to engage his brain. SLP drew graph of progress of someone who engages their brain in cognitive/life tasks and someone who watches TV first 3 months post-CVA. Douglas Cox thanked SLP for this graphic/visual cue. SLP strongly encouraged pt to engage in tasks other than watching TV. SLP asked pt for names of 5 spices and pt demo'd decr'd sustained/selective attention as he wrote "peppercini" - aphasic response(?) but emergent awareness poor at this time so pt did not realize he had made error.   08/17/22: Swallowing: Discussion today on rationale for thickened liquids to reinforce to pt to remain on nectar thick liquids due to  risk of aspiration. Educated on why wet voice was necessary to clear throat/cough and reswallow. SLP then worked with pt on a strong throat clear or cough and pt demonstrated x2/3 attempts. This did not carry over unless cued by SLP that pt had "wet" voice. Pt unsuccessfully attempted throat clear or cough without a demo by SLP. Wife stated she needs to demo that more at home instead of just verbally cue. SLP confirmed this.  Speech: Sustained attention noted for up to 90 seconds with route finding with visual support. Without visual support pt consistently with <60 seconds attention. Education on attention tasks to do at home, work in 15 minute increments (at least 6/day), signs that a break is necessary, and that OT and ST are to utilize neuroplasticity - SLP defined neuroplasticity for pt and wife.  08/11/22 (eval): SLP discussed deficits in attention and highlighted it as being pt's most significant deficit area. SLP educated wife re: how to cue pt for swallowing/meals. Assured Purnell Shoemaker multiple cues during a meal is necessary at this time due to poor sustained/selective attention. "I have to tell him a lot," she stated. After pt told SLP he didn't know when his CVA was, SLP ahd him write the date  on paper in front of him. Two minutes later pt req'd cue to look at paper to answer same question. In subsequent trials pt looked at paper independently, 4 minutes later, 5 minutes later, and 8 minutes later. SLP shared with Purnell Shoemaker that repetition may be beneficial for pt's memory, and encouraged her to cont to cue pt for small sips and double swallows.    PATIENT EDUCATION: Education details: see "today's treatment" Person educated: Patient and Spouse Education method: Explanation and Demonstration Education comprehension: verbalized understanding and needs further education   GOALS: Goals reviewed with patient? No  SHORT TERM GOALS: Target date: 09/10/22  Pt will follow aspiration precautions from FEES with occasional min A in 2 sessions Baseline: Goal status: Ongoing  2.  Jarom will demo sustained attention to participate in 10 minute therapy task x3/session, in 3 sessions Baseline:  Goal status: Ongoing  3.  Pt will demo understanding of written cues in simple therapy tasks, with initial cue allowed, in 3 sessions Baseline: 08-19-22 Goal status: Ongoing  4.  Pt will demo selective attention for 10 minutes to a simple therapy task in a min-mod noisy environment Baseline:  Goal status: Ongoing  5.  Pt/wife will tell SLP 3 overt s/sx aspiration PNA with modified independence Baseline:  Goal status: Ongoing  6   Using compensations, pt will ask wife to give him his medications, at least once/day between 3 sessions Baseline:  Goal status: Ongoing   LONG TERM GOALS: Target date: 11/09/22  Pt will follow aspiration precautions from FEES with rare min A in 3 sessions Baseline:  Goal status: Ongoing  2.  Pt will follow aspiration precautions from FEES with modified independence in 2 sessions Baseline:  Goal status: Ongoing  3.  Pt will demo selective attention for 20 minutes to a simple therapy task in a min-mod noisy environment Baseline:  Goal status: Ongoing  4.  Pt will  demo emergent awareness in simple therapy tasks with verbal cue for error awareness in 3 sessions Baseline:  Goal status: Ongoing  5.  Pt will demo alternating attention for 20 minutes to complete simple-mod complex therapy tasks 100% accuracy in 3 sessions Baseline:  Goal status: Ongoing  6.  Using compensations, pt will recall to administer his  own medications correctly, at least once/day between 3 sessions Baseline:  Goal status: Ongoing  7.  Wife's PROM (due to pt's attention deficit) score for pt will improve in last 1-2 sessions than the score taken in the first 1-2 sessions Baseline:  Goal status: Ongoing  ASSESSMENT:  CLINICAL IMPRESSION: Patient is a 72 y.o. male who was seen today for treatment of simple cognition (primarily attention) and for swallowing skills in light of CVA 07-17-22. Sustained attention noted for up to 90 seconds with route finding with visual support. Pt has had thin liquids when getting up mid-morning (3am). He has demo'd distaste for NTL telling wife in the AM once since eval, "I'm not drinking that shit anymore." Wife cont to push smoothies during dinner and uses this technique to get meds in pt.  OBJECTIVE IMPAIRMENTS: include attention, memory, awareness, executive functioning, receptive language, and dysphagia. These impairments are limiting patient from return to work, managing medications, managing appointments, managing finances, household responsibilities, ADLs/IADLs, effectively communicating at home and in community, and safety when swallowing. Factors affecting potential to achieve goals and functional outcome are ability to learn/carryover information, cooperation/participation level, and severity of impairments.. Patient will benefit from skilled SLP services to address above impairments and improve overall function.  REHAB POTENTIAL: Good  PLAN:  SLP FREQUENCY: 2x/week  SLP DURATION: 12 weeks  PLANNED INTERVENTIONS: Aspiration precaution  training, Pharyngeal strengthening exercises, Diet toleration management , Environmental controls, Trials of upgraded texture/liquids, Cueing hierachy, Cognitive reorganization, Internal/external aids, Multimodal communication approach, SLP instruction and feedback, Compensatory strategies, and Patient/family education    [facility], CCC-SLP 08/19/2022, 4:24 PM

## 2022-08-23 ENCOUNTER — Ambulatory Visit: Payer: Medicare HMO

## 2022-08-23 ENCOUNTER — Ambulatory Visit: Payer: Medicare HMO | Admitting: Occupational Therapy

## 2022-08-23 DIAGNOSIS — D6859 Other primary thrombophilia: Secondary | ICD-10-CM | POA: Insufficient documentation

## 2022-08-24 ENCOUNTER — Ambulatory Visit: Payer: Medicare HMO | Admitting: Occupational Therapy

## 2022-08-24 ENCOUNTER — Ambulatory Visit: Payer: Medicare HMO

## 2022-08-24 DIAGNOSIS — R41841 Cognitive communication deficit: Secondary | ICD-10-CM | POA: Diagnosis not present

## 2022-08-24 DIAGNOSIS — I69318 Other symptoms and signs involving cognitive functions following cerebral infarction: Secondary | ICD-10-CM

## 2022-08-24 DIAGNOSIS — R1312 Dysphagia, oropharyngeal phase: Secondary | ICD-10-CM

## 2022-08-24 DIAGNOSIS — R41840 Attention and concentration deficit: Secondary | ICD-10-CM

## 2022-08-24 NOTE — Therapy (Signed)
OUTPATIENT OCCUPATIONAL THERAPY  Treatment Note  Patient Name: Douglas Cox MRN: [id number] DOB:[birth year], 72 y.o., male Today's Date: 08/24/2022  PCP: Dr. Lorrene Reid ([facility]) REFERRING PROVIDER: Erick Colace, MD  END OF SESSION:  OT End of Session - 08/24/22 1241     Visit Number 3    Number of Visits 15    Date for OT Re-Evaluation 10/08/22    Authorization Type [employer]    OT Start Time 1233    OT Stop Time 1315    OT Time Calculation (min) 42 min    Activity Tolerance Patient tolerated treatment well    Behavior During Therapy Flat affect;Restless               Past Medical History:  Diagnosis Date   Celiac disease    Retinal detachment    Past Surgical History:  Procedure Laterality Date   CATARACT EXTRACTION Bilateral    Dr. Cathey Endow   EYE SURGERY     FRACTURE SURGERY     GAS INSERTION Right 05/21/2021   Procedure: INSERTION OF GAS - C3F8;  Surgeon: Rennis Chris, MD;  Location: [facility] OR;  Service: Ophthalmology;  Laterality: Right;   GAS/FLUID EXCHANGE Right 05/21/2021   Procedure: GAS/FLUID EXCHANGE;  Surgeon: Rennis Chris, MD;  Location: [facility] OR;  Service: Ophthalmology;  Laterality: Right;   HERNIA REPAIR     IR CT HEAD LTD  07/17/2022   IR PERCUTANEOUS ART THROMBECTOMY/INFUSION INTRACRANIAL INC DIAG ANGIO  07/17/2022   IR US GUIDE VASC ACCESS RIGHT  07/17/2022   PARS PLANA VITRECTOMY Right 05/21/2021   Procedure: PARS PLANA VITRECTOMY WITH 25 GAUGE;  Surgeon: Rennis Chris, MD;  Location: [facility] OR;  Service: Ophthalmology;  Laterality: Right;   PERFLUORONE INJECTION Right 05/21/2021   Procedure: PERFLUORONE INJECTION;  Surgeon: Rennis Chris, MD;  Location: [facility] OR;  Service: Ophthalmology;  Laterality: Right;   PHOTOCOAGULATION WITH LASER Right 05/21/2021   Procedure: PHOTOCOAGULATION WITH LASER;  Surgeon: Rennis Chris, MD;  Location: [facility] OR;  Service: Ophthalmology;  Laterality: Right;   RADIOLOGY WITH ANESTHESIA N/A 07/17/2022   Procedure:  IR WITH ANESTHESIA;  Surgeon: Radiologist, Medication, MD;  Location: [facility] OR;  Service: Radiology;  Laterality: N/A;   RETINAL DETACHMENT SURGERY     Patient Active Problem List   Diagnosis Date Noted   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 (HCC) 07/22/2022   Protein-calorie malnutrition, severe 07/19/2022   Stroke (cerebrum) (HCC) 07/17/2022   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 male with a PMHx of cataracts, celiac disease and retinal detachment who presents to the ED with acute onset of left hemiplegia, right gaze deviation and left hemineglect.MRI: Acute/subacute nonhemorrhagic infarct of the right caudate head and lentiform nucleus; acute/subacute nonhemorrhagic infarct of the left caudate head and lentiform nucleus spanning the internal capsule; Acute/subacute nonhemorrhagic infarct of the cortex of the right temporal tip.  PRECAUTIONS: Fall  WEIGHT BEARING RESTRICTIONS: No  PAIN:  Are you  having pain? No  FALLS: Has patient fallen in last 6 months? Yes. Number of falls 2 - 1 when he had the stroke he fell forward and hit his head and fell off a ladder  LIVING ENVIRONMENT: Lives with: lives with their spouse Lives in: House/apartment Stairs: Yes: Internal: 16 steps; on right going up and External: 4 steps to front door, 16 steps from garage to main floor of home steps; on right  going up Has following equipment at home: Single point cane  PLOF: Independent, working as an Pension scheme manager  PATIENT GOALS: to improve thought process, speech  OBJECTIVE:   HAND DOMINANCE: Right  ADLs: Overall ADLs: Independent with all bathing/dressing.    IADLs: Shopping: Not interested in going to the store, but would have gone prior to CVA Light housekeeping: is able to assist with household tasks when spouse asks for assistance or directs him to complete Meal Prep: Spouse is proving supervision for meal prep, but has been able to cook fish Community mobility: MD has not cleared pt to drive Medication management: Spouse administers medication, is having difficulty with administering B12 shot Financial management: Spouse is currently paying bills, but pt would have completed prior to CVA   COORDINATION: Finger Nose Finger test: mild ataxia, dysmetria LUE 9 Hole Peg test: Right: 32.37 sec; Left: 50.04 sec  Noted some difficulty with sequencing, requiring cues for recall and sequencing of directions Box and Blocks:  Right 38 blocks, Left 40 blocks (Pt frequently picking up 2-3 blocks at a time despite mod-max cues to pick up only one block at a time.    COGNITION: Overall cognitive status: Impaired Orientation Level: Person;Place;Situation Person: Oriented Place: Disoriented Situation: Disoriented Memory: Impaired Memory Impairment: Storage deficit;Retrieval deficit;Decreased recall of new information;Decreased short term memory Attention: Focused Awareness: Impaired Awareness Impairment: Intellectual impairment Problem Solving: Impaired Problem Solving Impairment: Verbal basic;Functional basic Behaviors: Restless;Impulsive Safety/Judgment: Impaired  OBSERVATIONS: Pt demonstrating attention at focused level during 9 hole peg test, requiring frequent cues and redirection to attend to 2 step task with Box and Blocks.  Pt demonstrating decreased interest and motivation during OT  evaluation, however pt and spouse agreeable to continue with OT at this time to focus on memory and return to engagement in IADLs and leisure pursuits.  TODAY'S TREATMENT:                                  08/24/22 Attention: engaged in small peg board pattern replication activity with challenge to sustain focus on task progressing to selective and alternating attention.  Pt demonstrated focused and selective attention to maintain focus on placing pegs into pegboard, however when OT increasing challenge to have pt engage in categorical naming.  Pt then only able to focus on placing pegs, requiring physical cues with OT blocking next peg hole to facilitate attention to task and naming of item.  Pt demonstrating ability to recall category 50% of time but with decreased recall of items named after ~1-2 mins.  Working memory: engaged in portion of Guess in 10 challenging pt to ask questions to identify particular animal and then challenging pt to identify 3 letter animals, and naming food items alphabetically while alternating with therapist and walking for increased alternating attention.  Pt having to stop each time to think, only able to walk while therapist was completing her turn.   08/17/22 Trail Making A and B:  pt completed Trail making A in  1:06.69 sec with 1 error connecting 12-14.  Pt able to correct without cues.  Pt unable to complete Trail making B without mod-max cues for sequencing of alternating sequence.  Pt initially able to correct errors below number 5E, but afterwards pt would complete with no semblance of sequence.  Pt's spouse asking if he should do this task at home, discussed functional carryover with alternating attention with TV on or completing task while carrying on conversation. Memory games: engaged in naming activity with naming items by category to challenge thinking skills, attention, following directions, sequencing, and memory.  Pt requiring increased time to identify items in  category, even when given familiar category.  Discussed game options such as Tapple, Guess in 10, Hang man, "I'm going on a trip..." as well as modifications to games to complete without need to purchase above games.   Educated on ways to modify games to increase/decrease challenge and how to increase from sustained to alternating to divided attention as able.    PATIENT EDUCATION: Education details: Educated on memory games to complete at home. Person educated: Patient and Spouse Education method: Explanation Education comprehension: verbalized understanding and needs further education  HOME EXERCISE PROGRAM: TBD   GOALS: Goals reviewed with patient? Yes  SHORT TERM GOALS: Target date: 09/03/22  Pt will be able to identify/verbalize steps and/or items needed to complete simple functional task (simple snack prep, laundry task, etc) with min cues. Baseline: Goal status: IN PROGRESS  2.  Patient will be able to maintain selective attention for 2 mins during structured task without additional cues for attention.  Baseline:  Goal status: IN PROGRESS  3.  Pt will complete table top scanning activity with min cues for technique Baseline:  Goal status: IN PROGRESS  5.  Pt will be able to utilize phone to locate contacts and make emergency calls as needed without cues. Baseline:  Goal status: IN PROGRESS   LONG TERM GOALS: Target date: 09/24/22  Pt will demonstrate ability to sequence simple functional task (simple snack prep, laundry task, etc) at Mod I level. Baseline:  Goal status: IN PROGRESS  2.  Pt will navigate a moderately busy environment, following multi-step commands with 90% accuracy to simulate return to community reintegration. Baseline:  Goal status: IN PROGRESS  3.  Pt will be able to utilize phone to look up phone number for physician and/or other frequently used number/location without cues. Baseline:  Goal status: IN PROGRESS  4.  Pt will be able to verbalize  wayfinding/directions from home to familiar locations in the area without cues. Baseline:  Goal status: IN PROGRESS  5.  Pt will complete pill box assessment with <5 errors in 5 min time limit to demonstrate increased attention to task. Baseline:  Goal status: IN PROGRESS  6.  Pt will complete MOCA and demonstrate improved cognitive function on MOCA by 3 points. Baseline: TBD Goal status: IN PROGRESS  ASSESSMENT:  CLINICAL IMPRESSION: Pt demonstrating focused attention this session with peg board pattern replication.  Pt demonstrating increased difficulty with sustained and alternating attention to engage in categorical naming based on peg colors.  Pt perseverating on peg placement and requiring physical cues (by covering up spaces) to attempt to alternate attention.  Pt demonstrating decreased attention during memory games and difficulty with recall of words even with familiar categories.  Pt's spouse receptive to education on how to increase/decrease challenge and to engage in tasks that are/were familiar in nature to assess engagement and success.  PERFORMANCE DEFICITS:  in functional skills including IADLs, coordination, Fine motor control, Gross motor control, body mechanics, endurance, decreased knowledge of precautions, decreased knowledge of use of DME, and UE functional use, cognitive skills including attention, energy/drive, memory, orientation, problem solving, safety awareness, sequencing, and temperament/personality, and psychosocial skills including coping strategies, habits, and routines and behaviors.   IMPAIRMENTS: are limiting patient from IADLs and leisure.   CO-MORBIDITIES: may have co-morbidities  that affects occupational performance. Patient will benefit from skilled OT to address above impairments and improve overall function.  MODIFICATION OR ASSISTANCE TO COMPLETE EVALUATION: Min-Moderate modification of tasks or assist with assess necessary to complete an  evaluation.  OT OCCUPATIONAL PROFILE AND HISTORY: Detailed assessment: Review of records and additional review of physical, cognitive, psychosocial history related to current functional performance.  CLINICAL DECISION MAKING: Moderate - several treatment options, min-mod task modification necessary  REHAB POTENTIAL: Good  EVALUATION COMPLEXITY: Moderate    PLAN:  OT FREQUENCY: 1-2x/week  OT DURATION: 6 weeks  PLANNED INTERVENTIONS: self care/ADL training, therapeutic exercise, therapeutic activity, balance training, functional mobility training, patient/family education, cognitive remediation/compensation, psychosocial skills training, energy conservation, coping strategies training, and DME and/or AE instructions  RECOMMENDED OTHER SERVICES: NA  CONSULTED AND AGREED WITH PLAN OF CARE: Patient and family member/caregiver  PLAN FOR NEXT SESSION: focused and selective attention - use peg board with pattern, functional tasks with providing directions, making phone calls, preparing simple meal.  Complete pill box assessment.  Tapple or Guess in [place], [place], OTR/L 08/24/2022, 12:41 PM

## 2022-08-25 NOTE — Therapy (Signed)
OUTPATIENT SPEECH LANGUAGE PATHOLOGY TREATMENT   Patient Name: Douglas Cox MRN: [id number] DOB:12/27/1950, 72 y.o., male Today's Date: 08/25/2022  PCP: None Recorded REFERRING PROVIDER: Alysia Penna, MD  END OF SESSION:     Past Medical History:  Diagnosis Date   Celiac disease    Retinal detachment    Past Surgical History:  Procedure Laterality Date   CATARACT EXTRACTION Bilateral    Dr. Valetta Close   EYE SURGERY     FRACTURE SURGERY     GAS INSERTION Right 05/21/2021   Procedure: INSERTION OF GAS - C3F8;  Surgeon: Bernarda Caffey, MD;  Location: [place];  Service: Ophthalmology;  Laterality: Right;   GAS/FLUID EXCHANGE Right 05/21/2021   Procedure: GAS/FLUID EXCHANGE;  Surgeon: Bernarda Caffey, MD;  Location: [place];  Service: Ophthalmology;  Laterality: Right;   HERNIA REPAIR     IR CT HEAD LTD  07/17/2022   IR PERCUTANEOUS ART THROMBECTOMY/INFUSION INTRACRANIAL INC DIAG ANGIO  07/17/2022   IR US GUIDE VASC ACCESS RIGHT  07/17/2022   PARS PLANA VITRECTOMY Right 05/21/2021   Procedure: PARS PLANA VITRECTOMY WITH 25 GAUGE;  Surgeon: Bernarda Caffey, MD;  Location: [place];  Service: Ophthalmology;  Laterality: Right;   PERFLUORONE INJECTION Right 05/21/2021   Procedure: PERFLUORONE INJECTION;  Surgeon: Bernarda Caffey, MD;  Location: [place];  Service: Ophthalmology;  Laterality: Right;   PHOTOCOAGULATION WITH LASER Right 05/21/2021   Procedure: PHOTOCOAGULATION WITH LASER;  Surgeon: Bernarda Caffey, MD;  Location: [place];  Service: Ophthalmology;  Laterality: Right;   RADIOLOGY WITH ANESTHESIA N/A 07/17/2022   Procedure: IR WITH ANESTHESIA;  Surgeon: Radiologist, Medication, MD;  Location: [place];  Service: Radiology;  Laterality: N/A;   RETINAL DETACHMENT SURGERY     Patient Active Problem List   Diagnosis Date Noted   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 ([place]) 07/22/2022   Protein-calorie malnutrition, severe 07/19/2022   Stroke (cerebrum) ([place]) 07/17/2022    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   Celiac disease    Alcohol dependence ([place])    Bradycardia     ONSET DATE: 07/17/22   REFERRING DIAG: I63.81 (ICD-10-CM) - 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  TNK given and recanalization acheived with thrombectomy.  Later at 1800 noted right sided weakness and new CT Head and CTA head and neck reordered and negative for reocclusion. MRI today shows bilateral basal ganglia infarcts. ASA 81mg  started 12/17  Pt with ETT for thrombectomy and overnight.  Extubated 12/17    PAIN:  Are you having pain? Yes: NPRS scale: 8/10 Pain location: lt back top teeth Pain description: sore    PATIENT GOALS: Pt did not answer question when asked, due to attention/processing deficits  OBJECTIVE:   DIAGNOSTIC FINDINGS:  FEES 07/30/22 Pt demonstrates a moderate oropharyngeal dysphagia that is impacted by patient's current cognitive deficits. Patient's oral phase is characterized by poor awareness of bolus resulting in inconsistent timing of swallowing trigger. With solids, patient needed verbal cues (snapping and counting) to attend and initiate a swallow with all boluses contained in the velleculae. With liquids, patient consistently triggered his swallow at the pyriform sinuses. This resulted in  sensed aspiration of thin liquids via cup. Patient also with inconsistent amounts of pharyngeal residue that cleared with a 2nd subsequent swallow. No penetration or aspiration noted with nectar-thick liquids except for 1 episode of flash penetration.  Provided thorough education to the patient's wife regarding patient's current swallowing function and that he is currently at a higher risk of aspiration due to cognitive deficits. She verbalized understanding and agreement with recommendations of continuing Dys. 3 textures but upgrading to nectar-thick liquids via cup with strict aspiration precautions and supervision. This will hopefully improve patient's intake of liquids with improved ability to stay hydrated without IV fluids prior to potential discharge on 08/03/22. SLP Diet Recommendations Dysphagia 3 (Mech soft) solids;Nectar thick liquid Liquid Administration via Cup;Spoon;No straw Medication Administration Crushed with puree Compensations: Slow rate;Small sips/bites;Minimize environmental distractions;Multiple dry swallows after each bite/sip  Today, pt ate dys III and drank nectar liqiuds. See "clinicial impressions" for results.  STANDARDIZED ASSESSMENTS: CLQT: may need to be completed in first 1-3 sessions  PATIENT REPORTED OUTCOME MEASURES (PROM): Cognitive function: Short Form: to be administered in first 1-3 sessions   TODAY'S TREATMENT:                                                                                                                                         DATE:  08/24/22: speech/cognition: Pt demonstrated sustained attention for 7 minutes today for therapy task of writing down reasons for each med. He demonstrated attention adequate to recall 3/5 reasons 2 minutes later, when given extra time, and required mod A with other two. He continues to perform attention lengthening tasks at home. Wife has downloaded [employer] to her phone and has directed pt to complete. Pt rec'd cont'd rationale today for his meds - he "does not like taking pills." Pt inquired reason for CVA today in session and was told by wife and SLP. Today, pt's reasoning questionable with rationale to not take med/s with pureed but pt indicated he  did not enjoy the smoothies wife was making for him with meds in them.  Swallowing: SLP discussed ways to assist pt in taking meds as they are currently being crushed and put into a smoothie for pt; "Do I have to have the smoothies" pt inquired. SLP suggested meds with applesauce, and yogurt, one pureed item at a time, and pt declined each time. SLP, pt, and wife eventually agreed pt may attempt one med with pureed item until next session.  08/19/22:SLP targeted pt's attention in session today. He made poached egg so SLP asked for steps for making poached egg. Pt req'd occasional mod cues back to task with SLP using written cues. Pt expressive language is slow due to decr'd linguistic processing skills/decr'd attention.  SLP asked what pt had done in last 24 hours and req'd usual max A other than "watched [place]." SLP told pt/wife that </= 2 or 2  1/2 hours of TV a day are ok, but pt needs to engage his brain. SLP drew graph of progress of someone who engages their brain in cognitive/life tasks and someone who watches TV first 3 months post-CVA. Douglas Cox thanked SLP for this graphic/visual cue. SLP strongly encouraged pt to engage in tasks other than watching TV. SLP asked pt for names of 5 spices and pt demo'd decr'd sustained/selective attention as he wrote "peppercini" - aphasic response(?) but emergent awareness poor at this time so pt did not realize he had made error.   08/17/22: Swallowing: Discussion today on rationale for thickened liquids to reinforce to pt to remain on nectar thick liquids due to risk of aspiration. Educated on why wet voice was necessary to clear throat/cough and reswallow. SLP then worked with pt on a strong throat clear or cough and pt demonstrated x2/3 attempts. This did not carry over unless cued by SLP that pt had "wet" voice. Pt unsuccessfully attempted throat clear or cough without a demo by SLP. Wife stated she needs to demo that more at home instead of just verbally cue.  SLP confirmed this.  Speech: Sustained attention noted for up to 90 seconds with route finding with visual support. Without visual support pt consistently with <60 seconds attention. Education on attention tasks to do at home, work in 15 minute increments (at least 6/day), signs that a break is necessary, and that OT and ST are to utilize neuroplasticity - SLP defined neuroplasticity for pt and wife.  08/11/22 (eval): SLP discussed deficits in attention and highlighted it as being pt's most significant deficit area. SLP educated wife re: how to cue pt for swallowing/meals. Assured Douglas Cox multiple cues during a meal is necessary at this time due to poor sustained/selective attention. "I have to tell him a lot," she stated. After pt told SLP he didn't know when his CVA was, SLP ahd him write the date on paper in front of him. Two minutes later pt req'd cue to look at paper to answer same question. In subsequent trials pt looked at paper independently, 4 minutes later, 5 minutes later, and 8 minutes later. SLP shared with Douglas Cox that repetition may be beneficial for pt's memory, and encouraged her to cont to cue pt for small sips and double swallows.    PATIENT EDUCATION: Education details: see "today's treatment" Person educated: Patient and Spouse Education method: Explanation and Demonstration Education comprehension: verbalized understanding and needs further education   GOALS: Goals reviewed with patient? No  SHORT TERM GOALS: Target date: 09/10/22  Pt will follow aspiration precautions from FEES with occasional min A in 2 sessions Baseline: Goal status: Ongoing  2.  Morgen will demo sustained attention to participate in 10 minute therapy task x3/session, in 3 sessions Baseline:  Goal status: Ongoing  3.  Pt will demo understanding of written cues in simple therapy tasks, with initial cue allowed, in 3 sessions Baseline: 08-19-22 Goal status: Ongoing  4.  Pt will demo selective attention for 10  minutes to a simple therapy task in a min-mod noisy environment Baseline:  Goal status: Ongoing  5.  Pt/wife will tell SLP 3 overt s/sx aspiration PNA with modified independence Baseline:  Goal status: Ongoing  6   Using compensations, pt will ask wife to give him his medications, at least once/day between 3 sessions Baseline:  Goal status: Ongoing   LONG TERM GOALS: Target date: 11/09/22  Pt will follow aspiration precautions from FEES with rare min A in 3  sessions Baseline:  Goal status: Ongoing  2.  Pt will follow aspiration precautions from FEES with modified independence in 2 sessions Baseline:  Goal status: Ongoing  3.  Pt will demo selective attention for 20 minutes to a simple therapy task in a min-mod noisy environment Baseline:  Goal status: Ongoing  4.  Pt will demo emergent awareness in simple therapy tasks with verbal cue for error awareness in 3 sessions Baseline:  Goal status: Ongoing  5.  Pt will demo alternating attention for 20 minutes to complete simple-mod complex therapy tasks 100% accuracy in 3 sessions Baseline:  Goal status: Ongoing  6.  Using compensations, pt will recall to administer his own medications correctly, at least once/day between 3 sessions Baseline:  Goal status: Ongoing  7.  Wife's PROM (due to pt's attention deficit) score for pt will improve in last 1-2 sessions than the score taken in the first 1-2 sessions Baseline:  Goal status: Ongoing  ASSESSMENT:  CLINICAL IMPRESSION: Patient is a 72 y.o. male who was seen today for treatment of simple cognition (primarily attention) and for swallowing skills in light of CVA 07-17-22. Sustained attention noted for up to 7 minutes in a written task. Wife cont to push smoothies during dinner and uses this technique to get meds in pt. Pt demonstrated decr'd reasoning ability today with this subject (See above).  OBJECTIVE IMPAIRMENTS: include attention, memory, awareness, executive functioning,  receptive language, and dysphagia. These impairments are limiting patient from return to work, managing medications, managing appointments, managing finances, household responsibilities, ADLs/IADLs, effectively communicating at home and in community, and safety when swallowing. Factors affecting potential to achieve goals and functional outcome are ability to learn/carryover information, cooperation/participation level, and severity of impairments.. Patient will benefit from skilled SLP services to address above impairments and improve overall function.  REHAB POTENTIAL: Good  PLAN:  SLP FREQUENCY: 2x/week  SLP DURATION: 12 weeks  PLANNED INTERVENTIONS: Aspiration precaution training, Pharyngeal strengthening exercises, Diet toleration management , Environmental controls, Trials of upgraded texture/liquids, Cueing hierachy, Cognitive reorganization, Internal/external aids, Multimodal communication approach, SLP instruction and feedback, Compensatory strategies, and Patient/family education    [facility], CCC-SLP 08/25/2022, 7:16 AM

## 2022-08-26 ENCOUNTER — Other Ambulatory Visit (HOSPITAL_COMMUNITY): Payer: Self-pay

## 2022-08-26 ENCOUNTER — Ambulatory Visit: Payer: Medicare HMO

## 2022-08-26 ENCOUNTER — Encounter: Payer: Self-pay | Admitting: Thoracic Surgery (Cardiothoracic Vascular Surgery)

## 2022-08-26 DIAGNOSIS — R41841 Cognitive communication deficit: Secondary | ICD-10-CM

## 2022-08-26 DIAGNOSIS — R1312 Dysphagia, oropharyngeal phase: Secondary | ICD-10-CM

## 2022-08-26 NOTE — Therapy (Signed)
OUTPATIENT SPEECH LANGUAGE PATHOLOGY TREATMENT   Patient Name: Douglas Cox MRN: [id number] DOB:1951-01-21, 72 y.o., male Today's Date: 08/26/2022  PCP: None Recorded REFERRING PROVIDER: Alysia Penna, MD  END OF SESSION:  End of Session - 08/26/22 1712     Visit Number 5    Number of Visits 25    Date for SLP Re-Evaluation 11/09/22    SLP Start Time 1429    SLP Stop Time  1703    SLP Time Calculation (min) 154 min    Activity Tolerance Other (comment)               Past Medical History:  Diagnosis Date   Celiac disease    Retinal detachment    Past Surgical History:  Procedure Laterality Date   CATARACT EXTRACTION Bilateral    Dr. Valetta Close   EYE SURGERY     FRACTURE SURGERY     GAS INSERTION Right 05/21/2021   Procedure: INSERTION OF GAS - C3F8;  Surgeon: Bernarda Caffey, MD;  Location: [facility];  Service: Ophthalmology;  Laterality: Right;   GAS/FLUID EXCHANGE Right 05/21/2021   Procedure: GAS/FLUID EXCHANGE;  Surgeon: Bernarda Caffey, MD;  Location: [facility];  Service: Ophthalmology;  Laterality: Right;   HERNIA REPAIR     IR CT HEAD LTD  07/17/2022   IR PERCUTANEOUS ART THROMBECTOMY/INFUSION INTRACRANIAL INC DIAG ANGIO  07/17/2022   IR US GUIDE VASC ACCESS RIGHT  07/17/2022   PARS PLANA VITRECTOMY Right 05/21/2021   Procedure: PARS PLANA VITRECTOMY WITH 25 GAUGE;  Surgeon: Bernarda Caffey, MD;  Location: [facility];  Service: Ophthalmology;  Laterality: Right;   PERFLUORONE INJECTION Right 05/21/2021   Procedure: PERFLUORONE INJECTION;  Surgeon: Bernarda Caffey, MD;  Location: [facility];  Service: Ophthalmology;  Laterality: Right;   PHOTOCOAGULATION WITH LASER Right 05/21/2021   Procedure: PHOTOCOAGULATION WITH LASER;  Surgeon: Bernarda Caffey, MD;  Location: [facility];  Service: Ophthalmology;  Laterality: Right;   RADIOLOGY WITH ANESTHESIA N/A 07/17/2022   Procedure: IR WITH ANESTHESIA;  Surgeon: Radiologist, Medication, MD;  Location: [facility];  Service: Radiology;  Laterality:  N/A;   RETINAL DETACHMENT SURGERY     Patient Active Problem List   Diagnosis Date Noted   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 ([place]) 07/22/2022   Protein-calorie malnutrition, severe 07/19/2022   Stroke (cerebrum) ([place]) 07/17/2022   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  TNK given and recanalization acheived with thrombectomy.  Later at 1800 noted right sided weakness and new CT Head and CTA head and neck reordered and negative for reocclusion. MRI today shows bilateral basal ganglia infarcts. ASA 81mg  started 12/17  Pt with ETT for thrombectomy and overnight.  Extubated 12/17    PAIN:  Are you having pain? Yes: NPRS scale: 8/10 Pain location: lt back top teeth Pain description: sore    PATIENT GOALS: Pt did not answer question when asked, due to attention/processing deficits  OBJECTIVE:   DIAGNOSTIC FINDINGS:  FEES 07/30/22 Pt demonstrates a moderate oropharyngeal dysphagia that is impacted by patient's current cognitive deficits. Patient's oral phase is characterized by poor awareness of bolus resulting in inconsistent timing of swallowing trigger. With solids, patient needed verbal cues (snapping and counting) to attend and initiate a swallow with all boluses contained in the velleculae. With liquids, patient consistently triggered his swallow at the pyriform sinuses. This resulted  in sensed aspiration of thin liquids via cup. Patient also with inconsistent amounts of pharyngeal residue that cleared with a 2nd subsequent swallow. No penetration or aspiration noted with nectar-thick liquids except for 1 episode of flash penetration. Provided thorough education to the patient's wife regarding patient's current swallowing function and that he is currently at a higher risk of aspiration due to cognitive deficits. She verbalized understanding and agreement with recommendations of continuing Dys. 3 textures but upgrading to nectar-thick liquids via cup with strict aspiration precautions and supervision. This will hopefully improve patient's intake of liquids with improved ability to stay hydrated without IV fluids prior to potential discharge on 08/03/22. SLP Diet Recommendations Dysphagia 3 (Mech soft) solids;Nectar thick liquid Liquid Administration via Cup;Spoon;No straw Medication Administration Crushed with puree Compensations: Slow rate;Small sips/bites;Minimize environmental distractions;Multiple dry swallows after each bite/sip  Today, pt ate dys III and drank nectar liqiuds. See "clinicial impressions" for results.  STANDARDIZED ASSESSMENTS: CLQT: may need to be completed in first 1-3 sessions  PATIENT REPORTED OUTCOME MEASURES (PROM): Cognitive function: Short Form: to be administered in first 1-3 sessions   TODAY'S TREATMENT:                                                                                                                                         DATE:  1/25:24: Wife asked if pt was safe for thin liquids. SLP told pt/wife that if pt took single sips and was not exhibiting coughing he could have thin liquids. SLP to assess next session. In a quiet environment, pt completed written math task for 9 minutes prior to requiring 5 minute break, evidenced by difficulty with/inability in adding simple figures. Pt impulsive about starting back to task again and req'd  SLP cueing for longer break. After 6 minutes, pt said, "Get back to this again," and re-focused on written math task- processing speed improved for 3 more entries until pt had to leave. SLP educated/highlighted to pt and wife the importance of taking breaks when he feels "stuck".   08/24/22: speech/cognition: Pt demonstrated sustained attention for 7 minutes today for therapy task of writing down reasons for each med. He demonstrated attention adequate to recall 3/5 reasons 2 minutes later, when given extra time, and required mod A with other two. He continues to perform attention lengthening tasks at home. Wife has downloaded [employer] to her phone and has directed pt to complete. Pt rec'd cont'd rationale today for his meds - he "does not like taking  pills." Pt inquired reason for CVA today in session and was told by wife and SLP. Today, pt's reasoning questionable with rationale to not take med/s with pureed but pt indicated he did not enjoy the smoothies wife was making for him with meds in them.  Swallowing: SLP discussed ways to assist pt in taking meds as they are currently being crushed and put into a smoothie for pt; "Do I have to have the smoothies" pt inquired. SLP suggested meds with applesauce, and yogurt, one pureed item at a time, and pt declined each time. SLP, pt, and wife eventually agreed pt may attempt one med with pureed item until next session.  08/19/22:SLP targeted pt's attention in session today. He made poached egg so SLP asked for steps for making poached egg. Pt req'd occasional mod cues back to task with SLP using written cues. Pt expressive language is slow due to decr'd linguistic processing skills/decr'd attention.  SLP asked what pt had done in last 24 hours and req'd usual max A other than "watched [place]." SLP told pt/wife that </= 2 or 2 1/2 hours of TV a day are ok, but pt needs to engage his brain. SLP drew graph of progress of someone who engages their brain  in cognitive/life tasks and someone who watches TV first 3 months post-CVA. Purnell Shoemaker thanked SLP for this graphic/visual cue. SLP strongly encouraged pt to engage in tasks other than watching TV. SLP asked pt for names of 5 spices and pt demo'd decr'd sustained/selective attention as he wrote "peppercini" - aphasic response(?) but emergent awareness poor at this time so pt did not realize he had made error.   08/17/22: Swallowing: Discussion today on rationale for thickened liquids to reinforce to pt to remain on nectar thick liquids due to risk of aspiration. Educated on why wet voice was necessary to clear throat/cough and reswallow. SLP then worked with pt on a strong throat clear or cough and pt demonstrated x2/3 attempts. This did not carry over unless cued by SLP that pt had "wet" voice. Pt unsuccessfully attempted throat clear or cough without a demo by SLP. Wife stated she needs to demo that more at home instead of just verbally cue. SLP confirmed this.  Speech: Sustained attention noted for up to 90 seconds with route finding with visual support. Without visual support pt consistently with <60 seconds attention. Education on attention tasks to do at home, work in 15 minute increments (at least 6/day), signs that a break is necessary, and that OT and ST are to utilize neuroplasticity - SLP defined neuroplasticity for pt and wife.  08/11/22 (eval): SLP discussed deficits in attention and highlighted it as being pt's most significant deficit area. SLP educated wife re: how to cue pt for swallowing/meals. Assured Purnell Shoemaker multiple cues during a meal is necessary at this time due to poor sustained/selective attention. "I have to tell him a lot," she stated. After pt told SLP he didn't know when his CVA was, SLP ahd him write the date on paper in front of him. Two minutes later pt req'd cue to look at paper to answer same question. In subsequent trials pt looked at paper independently, 4 minutes later, 5 minutes  later, and 8 minutes later. SLP shared with Purnell Shoemaker that repetition may be beneficial for pt's memory, and encouraged her to cont to cue pt for small sips and double swallows.    PATIENT EDUCATION: Education details: see "today's treatment" Person educated: Patient and Spouse Education method:  Explanation and Demonstration Education comprehension: verbalized understanding and needs further education   GOALS: Goals reviewed with patient? No  SHORT TERM GOALS: Target date: 09/10/22  Pt will follow aspiration precautions from FEES with occasional min A in 2 sessions Baseline: Goal status: Ongoing  2.  [place]and[place] will demo sustained attention to participate in 10 minute therapy task x3/session, in 3 sessions Baseline:  Goal status: Ongoing  3.  Pt will demo understanding of written cues in simple therapy tasks, with initial cue allowed, in 3 sessions Baseline: 08-19-22 Goal status: Ongoing  4.  Pt will demo selective attention for 10 minutes to a simple therapy task in a min-mod noisy environment Baseline:  Goal status: Ongoing  5.  Pt/wife will tell SLP 3 overt s/sx aspiration PNA with modified independence Baseline:  Goal status: Ongoing  6   Using compensations, pt will ask wife to give him his medications, at least once/day between 3 sessions Baseline:  Goal status: Ongoing   LONG TERM GOALS: Target date: 11/09/22  Pt will follow aspiration precautions from FEES with rare min A in 3 sessions Baseline:  Goal status: Ongoing  2.  Pt will follow aspiration precautions from FEES with modified independence in 2 sessions Baseline:  Goal status: Ongoing  3.  Pt will demo selective attention for 20 minutes to a simple therapy task in a min-mod noisy environment Baseline:  Goal status: Ongoing  4.  Pt will demo emergent awareness in simple therapy tasks with verbal cue for error awareness in 3 sessions Baseline:  Goal status: Ongoing  5.  Pt will demo alternating attention for 20  minutes to complete simple-mod complex therapy tasks 100% accuracy in 3 sessions Baseline:  Goal status: Ongoing  6.  Using compensations, pt will recall to administer his own medications correctly, at least once/day between 3 sessions Baseline:  Goal status: Ongoing  7.  Wife's PROM (due to pt's attention deficit) score for pt will improve in last 1-2 sessions than the score taken in the first 1-2 sessions Baseline:  Goal status: Ongoing  ASSESSMENT:  CLINICAL IMPRESSION: Patient is a 72 y.o. male who was seen today for treatment of simple cognition (primarily attention) and for swallowing skills in light of CVA 07-17-22. Sustained attention noted for up to 9 minutes in a written task. Wife has had success with applesauce for pt's Eliquis. Pt swallowing is improving as cognition improves.   OBJECTIVE IMPAIRMENTS: include attention, memory, awareness, executive functioning, receptive language, and dysphagia. These impairments are limiting patient from return to work, managing medications, managing appointments, managing finances, household responsibilities, ADLs/IADLs, effectively communicating at home and in community, and safety when swallowing. Factors affecting potential to achieve goals and functional outcome are ability to learn/carryover information, cooperation/participation level, and severity of impairments.. Patient will benefit from skilled SLP services to address above impairments and improve overall function.  REHAB POTENTIAL: Good  PLAN:  SLP FREQUENCY: 2x/week  SLP DURATION: 12 weeks  PLANNED INTERVENTIONS: Aspiration precaution training, Pharyngeal strengthening exercises, Diet toleration management , Environmental controls, Trials of upgraded texture/liquids, Cueing hierachy, Cognitive reorganization, Internal/external aids, Multimodal communication approach, SLP instruction and feedback, Compensatory strategies, and Patient/family education    [facility],  CCC-SLP 08/26/2022, 5:13 PM

## 2022-08-27 ENCOUNTER — Ambulatory Visit
Admit: 2022-08-27 | Discharge: 2022-08-27 | Disposition: A | Discharge status: Home / Self Care | Payer: Medicare HMO | Attending: Thoracic Surgery (Cardiothoracic Vascular Surgery) | Admitting: Thoracic Surgery (Cardiothoracic Vascular Surgery)

## 2022-08-27 ENCOUNTER — Telehealth: Payer: Self-pay

## 2022-08-27 ENCOUNTER — Other Ambulatory Visit: Payer: Self-pay

## 2022-08-27 ENCOUNTER — Ambulatory Visit: Payer: Medicare HMO | Admitting: Thoracic Surgery (Cardiothoracic Vascular Surgery)

## 2022-08-27 ENCOUNTER — Other Ambulatory Visit (HOSPITAL_COMMUNITY): Payer: Self-pay

## 2022-08-27 DIAGNOSIS — R911 Solitary pulmonary nodule: Secondary | ICD-10-CM

## 2022-08-27 DIAGNOSIS — J9859 Other diseases of mediastinum, not elsewhere classified: Secondary | ICD-10-CM | POA: Diagnosis not present

## 2022-08-27 MED ORDER — MAGNESIUM OXIDE -MG SUPPLEMENT 400 (240 MG) MG PO TABS
200.0000 mg | ORAL_TABLET | Freq: Every day | ORAL | 5 refills | Status: DC
  Filled 2022-08-27 – 2022-10-03 (×2): qty 30, 60d supply, fill #0
  Filled 2022-10-26: qty 30, 60d supply, fill #1

## 2022-08-27 MED ORDER — VITAMIN D3 25 MCG PO TABS
1000.0000 [IU] | ORAL_TABLET | Freq: Every day | ORAL | 5 refills | Status: AC
  Filled 2022-08-27: qty 30, 30d supply, fill #0

## 2022-08-27 MED ORDER — APIXABAN 5 MG PO TABS
5.0000 mg | ORAL_TABLET | Freq: Two times a day (BID) | ORAL | 1 refills | Status: DC
  Filled 2022-08-27: qty 60, 30d supply, fill #0
  Filled 2022-10-03: qty 60, 30d supply, fill #1

## 2022-08-27 NOTE — Telephone Encounter (Signed)
Is it okay for Douglas Cox to be sleeping 10-12 hours per night?

## 2022-08-27 NOTE — Progress Notes (Signed)
[address]       [place] [address]             [phone]       Patient: Home Provider: Office Consent for Telemedicine visit obtained.  Today's visit was completed via a real-time telehealth (see specific modality noted below). The patient/authorized person provided oral consent at the time of the visit to engage in a telemedicine encounter with the present provider at [facility]. The patient/authorized person was informed of the potential benefits, limitations, and risks of telemedicine. The patient/authorized person expressed understanding that the laws that protect confidentiality also apply to telemedicine. The patient/authorized person acknowledged understanding that telemedicine does not provide emergency services and that he or she would need to call 911 or proceed to the nearest hospital for help if such a need arose.   Total time spent in the clinical discussion 10 minutes.  Telehealth Modality: Phone visit (audio only)  I had a telephone visit with Mr. Whetzel.  He recently suffered a stroke, but has been doing well.  I reviewed his cross-sectional imaging, and the anterior mediastinal cyst appears stable.  He also has several small subcentimeter pulmonary nodules which are unchanged as well.  I explained to him that given the stability of the likely benign nature of this there is no need for any further surveillance.  Charlotte Brafford Bary Leriche

## 2022-08-30 ENCOUNTER — Other Ambulatory Visit (HOSPITAL_COMMUNITY): Payer: Self-pay

## 2022-08-30 MED ORDER — FAMOTIDINE 20 MG PO TABS
20.0000 mg | ORAL_TABLET | Freq: Two times a day (BID) | ORAL | 4 refills | Status: DC
  Filled 2022-08-30: qty 90, 45d supply, fill #0
  Filled 2022-10-03: qty 90, 45d supply, fill #1

## 2022-08-31 ENCOUNTER — Ambulatory Visit: Payer: Medicare HMO

## 2022-08-31 ENCOUNTER — Ambulatory Visit: Payer: Medicare HMO | Admitting: Occupational Therapy

## 2022-08-31 DIAGNOSIS — R41841 Cognitive communication deficit: Secondary | ICD-10-CM

## 2022-08-31 DIAGNOSIS — R1312 Dysphagia, oropharyngeal phase: Secondary | ICD-10-CM

## 2022-08-31 DIAGNOSIS — I69254 Hemiplegia and hemiparesis following other nontraumatic intracranial hemorrhage affecting left non-dominant side: Secondary | ICD-10-CM

## 2022-08-31 DIAGNOSIS — I69318 Other symptoms and signs involving cognitive functions following cerebral infarction: Secondary | ICD-10-CM

## 2022-08-31 DIAGNOSIS — R41840 Attention and concentration deficit: Secondary | ICD-10-CM

## 2022-08-31 NOTE — Therapy (Signed)
OUTPATIENT SPEECH LANGUAGE PATHOLOGY TREATMENT   Patient Name: Douglas Cox MRN: [id number] DOB:1951-05-18, 72 y.o., male Today's Date: 08/31/2022  PCP: None Recorded REFERRING PROVIDER: Alysia Penna, MD  END OF SESSION:  End of Session - 08/31/22 1551     Visit Number 6    Number of Visits 25    Date for SLP Re-Evaluation 11/09/22    SLP Start Time 1318    SLP Stop Time  1400    SLP Time Calculation (min) 42 min    Activity Tolerance Other (comment)   limited by severity of deficit               Past Medical History:  Diagnosis Date   Celiac disease    Retinal detachment    Past Surgical History:  Procedure Laterality Date   CATARACT EXTRACTION Bilateral    Dr. Valetta Close   EYE SURGERY     FRACTURE SURGERY     GAS INSERTION Right 05/21/2021   Procedure: INSERTION OF GAS - C3F8;  Surgeon: Bernarda Caffey, MD;  Location: [facility];  Service: Ophthalmology;  Laterality: Right;   GAS/FLUID EXCHANGE Right 05/21/2021   Procedure: GAS/FLUID EXCHANGE;  Surgeon: Bernarda Caffey, MD;  Location: [facility];  Service: Ophthalmology;  Laterality: Right;   HERNIA REPAIR     IR CT HEAD LTD  07/17/2022   IR PERCUTANEOUS ART THROMBECTOMY/INFUSION INTRACRANIAL INC DIAG ANGIO  07/17/2022   IR US GUIDE VASC ACCESS RIGHT  07/17/2022   PARS PLANA VITRECTOMY Right 05/21/2021   Procedure: PARS PLANA VITRECTOMY WITH 25 GAUGE;  Surgeon: Bernarda Caffey, MD;  Location: [facility];  Service: Ophthalmology;  Laterality: Right;   PERFLUORONE INJECTION Right 05/21/2021   Procedure: PERFLUORONE INJECTION;  Surgeon: Bernarda Caffey, MD;  Location: [facility];  Service: Ophthalmology;  Laterality: Right;   PHOTOCOAGULATION WITH LASER Right 05/21/2021   Procedure: PHOTOCOAGULATION WITH LASER;  Surgeon: Bernarda Caffey, MD;  Location: [facility];  Service: Ophthalmology;  Laterality: Right;   RADIOLOGY WITH ANESTHESIA N/A 07/17/2022   Procedure: IR WITH ANESTHESIA;  Surgeon: Radiologist, Medication, MD;  Location: [facility];   Service: Radiology;  Laterality: N/A;   RETINAL DETACHMENT SURGERY     Patient Active Problem List   Diagnosis Date Noted   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 ([place]) 07/22/2022   Protein-calorie malnutrition, severe 07/19/2022   Stroke (cerebrum) ([place]) 07/17/2022   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   Celiac disease    Alcohol dependence ([place])    Bradycardia     ONSET DATE: 07/17/22   REFERRING DIAG: I63.81 (ICD-10-CM) - 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  TNK given and recanalization acheived with thrombectomy.  Later at 1800 noted right sided weakness and new CT Head and CTA head and neck reordered and negative for reocclusion. MRI today shows bilateral basal ganglia infarcts. ASA 81mg  started 12/17  Pt with ETT for thrombectomy and overnight.  Extubated 12/17    PAIN:  Are you having pain? Yes: NPRS scale: 8/10 Pain location: lt back top teeth Pain description: sore  PATIENT GOALS: Pt did not answer question when asked, due to attention/processing deficits  OBJECTIVE:   DIAGNOSTIC FINDINGS:  FEES 07/30/22 Pt demonstrates a moderate oropharyngeal dysphagia that is impacted by patient's current cognitive deficits. Patient's oral phase is characterized by poor awareness of bolus resulting in inconsistent timing of swallowing trigger. With solids, patient needed verbal cues (snapping and counting) to attend and initiate a swallow with all boluses contained  in the velleculae. With liquids, patient consistently triggered his swallow at the pyriform sinuses. This resulted in sensed aspiration of thin liquids via cup. Patient also with inconsistent amounts of pharyngeal residue that cleared with a 2nd subsequent swallow. No penetration or aspiration noted with nectar-thick liquids except for 1 episode of flash penetration. Provided thorough education to the patient's wife regarding patient's current swallowing function and that he is currently at a higher risk of aspiration due to cognitive deficits. She verbalized understanding and agreement with recommendations of continuing Dys. 3 textures but upgrading to nectar-thick liquids via cup with strict aspiration precautions and supervision. This will hopefully improve patient's intake of liquids with improved ability to stay hydrated without IV fluids prior to potential discharge on 08/03/22. SLP Diet Recommendations Dysphagia 3 (Mech soft) solids;Nectar thick liquid Liquid Administration via Cup;Spoon;No straw Medication Administration Crushed with puree Compensations: Slow rate;Small sips/bites;Minimize environmental distractions;Multiple dry swallows after each bite/sip  Today, pt ate dys III and drank nectar liqiuds. See "clinicial impressions" for results.  STANDARDIZED ASSESSMENTS: CLQT: may need to be completed in first 1-3 sessions  PATIENT REPORTED OUTCOME MEASURES (PROM): Cognitive function: Short Form: to be administered in first 1-3 sessions   TODAY'S TREATMENT:                                                                                                                                         DATE:  08/31/22: Pt and wife deny cont'd overt s/sx aspiration with meals. Pt still does not take meds with water due to "They are bitter." Purnell Shoemaker said pt has Dr. Riley Kill tomorrow so SLP had pt write down questions he has for Dr. Riley Kill. He thought of 2 spontaneously (5 with mod-max cues), and req'd  consistent extra time to think of questions, mod A consistently for legible writing (decr'd emergent awareness), mod A usually for writing question in a format he would recall tomorrow (decr'd anticipatory awareness). Purnell Shoemaker remarked that often she is having to encourage pt to get up from the chair to do things during the day. SLP strongly suggested a schedule and provided example of this, and also reiterated that pt will have decr'd ability to initiate tasks at this time and may require wife's cues to do daily tasks. SLP provided example of SLP calling pt's name in waiting room and him not arising until wife arises. Pt showing some incr'd awareness asking SLP if he will be able to return to things he was doing  pre-CVA. 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  1/25:24: Wife asked if pt was safe for thin liquids. SLP told pt/wife that if pt took single sips and was not exhibiting coughing he could have thin liquids. SLP to assess next session. In a quiet environment, pt completed written math task for 9 minutes prior to requiring 5 minute break, evidenced by difficulty with/inability in adding simple figures. Pt impulsive about starting back to task again and req'd SLP cueing for longer break. After 6 minutes, pt said, "Get back to this again," and re-focused on written math task- processing speed improved for 3 more entries until pt had to leave. SLP educated/highlighted to pt and wife the importance of taking breaks when he feels "stuck".   08/24/22: speech/cognition: Pt demonstrated sustained attention for 7 minutes today for therapy task of writing down reasons for each med. He demonstrated attention adequate to recall 3/5 reasons 2 minutes later, when given extra time, and required mod A with other two. He continues to perform attention lengthening tasks at home. Wife has downloaded [employer] to her phone and has directed pt to complete. Pt rec'd  cont'd rationale today for his meds - he "does not like taking pills." Pt inquired reason for CVA today in session and was told by wife and SLP. Today, pt's reasoning questionable with rationale to not take med/s with pureed but pt indicated he did not enjoy the smoothies wife was making for him with meds in them.  Swallowing: SLP discussed ways to assist pt in taking meds as they are currently being crushed and put into a smoothie for pt; "Do I have to have the smoothies" pt inquired. SLP suggested meds with applesauce, and yogurt, one pureed item at a time, and pt declined each time. SLP, pt, and wife eventually agreed pt may attempt one med with pureed item until next session.  08/19/22:SLP targeted pt's attention in session today. He made poached egg so SLP asked for steps for making poached egg. Pt req'd occasional mod cues back to task with SLP using written cues. Pt expressive language is slow due to decr'd linguistic processing skills/decr'd attention.  SLP asked what pt had done in last 24 hours and req'd usual max A other than "watched [place]." SLP told pt/wife that </= 2 or 2 1/2 hours of TV a day are ok, but pt needs to engage his brain. SLP drew graph of progress of someone who engages their brain in cognitive/life tasks and someone who watches TV first 3 months post-CVA. [name] thanked SLP for this graphic/visual cue. SLP strongly encouraged pt to engage in tasks other than watching TV. SLP asked pt for names of 5 spices and pt demo'd decr'd sustained/selective attention as he wrote "peppercini" - aphasic response(?) but emergent awareness poor at this time so pt did not realize he had made error.   08/17/22: Swallowing: Discussion today on rationale for thickened liquids to reinforce to pt to remain on nectar thick liquids due to risk of aspiration. Educated on why wet voice was necessary to clear throat/cough and reswallow. SLP then worked with pt on a strong throat clear or cough and pt  demonstrated x2/3 attempts. This did not carry over unless cued by SLP that pt had "wet" voice. Pt unsuccessfully attempted throat clear or cough without a demo by SLP. Wife stated she needs to demo that more at home instead of just verbally cue.  SLP confirmed this.  Speech: Sustained attention noted for up to 90 seconds with route finding with visual support. Without visual support pt consistently with <60 seconds attention. Education on attention tasks to do at home, work in 15 minute increments (at least 6/day), signs that a break is necessary, and that OT and ST are to utilize neuroplasticity - SLP defined neuroplasticity for pt and wife.  08/11/22 (eval): SLP discussed deficits in attention and highlighted it as being pt's most significant deficit area. SLP educated wife re: how to cue pt for swallowing/meals. Assured [name] multiple cues during a meal is necessary at this time due to poor sustained/selective attention. "I have to tell him a lot," she stated. After pt told SLP he didn't know when his CVA was, SLP ahd him write the date on paper in front of him. Two minutes later pt req'd cue to look at paper to answer same question. In subsequent trials pt looked at paper independently, 4 minutes later, 5 minutes later, and 8 minutes later. SLP shared with [name] that repetition may be beneficial for pt's memory, and encouraged her to cont to cue pt for small sips and double swallows.    PATIENT EDUCATION: Education details: see "today's treatment" Person educated: Patient and Spouse Education method: Explanation and Demonstration Education comprehension: verbalized understanding and needs further education   GOALS: Goals reviewed with patient? No  SHORT TERM GOALS: Target date: 09/10/22  Pt will follow aspiration precautions from FEES with occasional min A in 2 sessions Baseline: Goal status: Ongoing  2.  Darrell will demo sustained attention to participate in 10 minute therapy task x3/session, in  3 sessions Baseline:  Goal status: Ongoing  3.  Pt will demo understanding of written cues in simple therapy tasks, with initial cue allowed, in 3 sessions Baseline: 08-19-22 Goal status: Ongoing  4.  Pt will demo selective attention for 10 minutes to a simple therapy task in a min-mod noisy environment Baseline:  Goal status: Ongoing  5.  Pt/wife will tell SLP 3 overt s/sx aspiration PNA with modified independence Baseline:  Goal status: Ongoing  6   Using compensations, pt will ask wife to give him his medications, at least once/day between 3 sessions Baseline:  Goal status: Ongoing   LONG TERM GOALS: Target date: 11/09/22  Pt will follow aspiration precautions from FEES with rare min A in 3 sessions Baseline:  Goal status: Ongoing  2.  Pt will follow aspiration precautions from FEES with modified independence in 2 sessions Baseline:  Goal status: Ongoing  3.  Pt will demo selective attention for 20 minutes to a simple therapy task in a min-mod noisy environment Baseline:  Goal status: Ongoing  4.  Pt will demo emergent awareness in simple therapy tasks with verbal cue for error awareness in 3 sessions Baseline:  Goal status: Ongoing  5.  Pt will demo alternating attention for 20 minutes to complete simple-mod complex therapy tasks 100% accuracy in 3 sessions Baseline:  Goal status: Ongoing  6.  Using compensations, pt will recall to administer his own medications correctly, at least once/day between 3 sessions Baseline:  Goal status: Ongoing  7.  Wife's PROM (due to pt's attention deficit) score for pt will improve in last 1-2 sessions than the score taken in the first 1-2 sessions Baseline:  Goal status: Ongoing  ASSESSMENT:  CLINICAL IMPRESSION: Patient is a 72 y.o. male who was seen today for treatment of simple cognition (primarily attention) and for swallowing skills in  light of CVA 07-17-22. Sustained attention noted for up to 9 minutes in a written task.  Wife has had success with applesauce for pt's Eliquis. Pt swallowing is improving as cognition improves.   OBJECTIVE IMPAIRMENTS: include attention, memory, awareness, executive functioning, receptive language, and dysphagia. These impairments are limiting patient from return to work, managing medications, managing appointments, managing finances, household responsibilities, ADLs/IADLs, effectively communicating at home and in community, and safety when swallowing. Factors affecting potential to achieve goals and functional outcome are ability to learn/carryover information, cooperation/participation level, and severity of impairments.. Patient will benefit from skilled SLP services to address above impairments and improve overall function.  REHAB POTENTIAL: Good  PLAN:  SLP FREQUENCY: 2x/week  SLP DURATION: 12 weeks  PLANNED INTERVENTIONS: Aspiration precaution training, Pharyngeal strengthening exercises, Diet toleration management , Environmental controls, Trials of upgraded texture/liquids, Cueing hierachy, Cognitive reorganization, Internal/external aids, Multimodal communication approach, SLP instruction and feedback, Compensatory strategies, and Patient/family education    [facility], CCC-SLP 08/31/2022, 3:54 PM

## 2022-08-31 NOTE — Therapy (Signed)
OUTPATIENT OCCUPATIONAL THERAPY  Treatment Note  Patient Name: Azavion Bouillon MRN: [id number] DOB:[birth year], 72 y.o., male Today's Date: 08/31/2022  PCP: Dr. Zara Council ([facility]) REFERRING PROVIDER: Charlett Blake, MD  END OF SESSION:  OT End of Session - 08/31/22 1411     Visit Number 4    Number of Visits 15    Date for OT Re-Evaluation 10/08/22    Authorization Type [employer]    OT Start Time 1408    OT Stop Time 1448    OT Time Calculation (min) 40 min    Activity Tolerance Patient tolerated treatment well    Behavior During Therapy Flat affect;Restless                Past Medical History:  Diagnosis Date   Celiac disease    Retinal detachment    Past Surgical History:  Procedure Laterality Date   CATARACT EXTRACTION Bilateral    Dr. Valetta Close   EYE SURGERY     FRACTURE SURGERY     GAS INSERTION Right 05/21/2021   Procedure: INSERTION OF GAS - C3F8;  Surgeon: Bernarda Caffey, MD;  Location: [place];  Service: Ophthalmology;  Laterality: Right;   GAS/FLUID EXCHANGE Right 05/21/2021   Procedure: GAS/FLUID EXCHANGE;  Surgeon: Bernarda Caffey, MD;  Location: [place];  Service: Ophthalmology;  Laterality: Right;   HERNIA REPAIR     IR CT HEAD LTD  07/17/2022   IR PERCUTANEOUS ART THROMBECTOMY/INFUSION INTRACRANIAL INC DIAG ANGIO  07/17/2022   IR US GUIDE VASC ACCESS RIGHT  07/17/2022   PARS PLANA VITRECTOMY Right 05/21/2021   Procedure: PARS PLANA VITRECTOMY WITH 25 GAUGE;  Surgeon: Bernarda Caffey, MD;  Location: [place];  Service: Ophthalmology;  Laterality: Right;   PERFLUORONE INJECTION Right 05/21/2021   Procedure: PERFLUORONE INJECTION;  Surgeon: Bernarda Caffey, MD;  Location: [place];  Service: Ophthalmology;  Laterality: Right;   PHOTOCOAGULATION WITH LASER Right 05/21/2021   Procedure: PHOTOCOAGULATION WITH LASER;  Surgeon: Bernarda Caffey, MD;  Location: [place];  Service: Ophthalmology;  Laterality: Right;   RADIOLOGY WITH ANESTHESIA N/A 07/17/2022    Procedure: IR WITH ANESTHESIA;  Surgeon: Radiologist, Medication, MD;  Location: [place];  Service: Radiology;  Laterality: N/A;   RETINAL DETACHMENT SURGERY     Patient Active Problem List   Diagnosis Date Noted   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 ([place]) 07/22/2022   Protein-calorie malnutrition, severe 07/19/2022   Stroke (cerebrum) ([place]) 07/17/2022   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   Celiac disease    Alcohol dependence ([place])    Bradycardia     ONSET DATE: 08/04/22 (referral date)  REFERRING DIAG: I63.81 (ICD-10-CM) - 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 Pt accompanied by: self and significant other (spouse)  PERTINENT HISTORY: 72 y.o. male with a PMHx of cataracts, celiac disease and retinal detachment who presents to the ED with acute onset of left hemiplegia, right gaze deviation and left hemineglect.MRI: Acute/subacute nonhemorrhagic infarct of the right caudate head and lentiform nucleus; acute/subacute nonhemorrhagic infarct of the left caudate head and lentiform nucleus spanning the internal capsule; Acute/subacute nonhemorrhagic infarct of the cortex of the right temporal tip.  PRECAUTIONS: Fall  WEIGHT BEARING RESTRICTIONS: No  PAIN:  Are you having pain? No  FALLS: Has patient fallen in last 6 months? Yes. Number of falls 2 - 1 when he had the stroke he fell forward and hit his head and fell off a ladder  LIVING ENVIRONMENT: Lives with: lives with their spouse Lives in: House/apartment Stairs: Yes: Internal: 16 steps; on right  going up and External: 4 steps to front door, 16 steps from garage to main floor of home steps; on right going up Has following equipment at home: Single point cane  PLOF: Independent, working as an Radio producer  PATIENT GOALS: to improve thought process, speech  OBJECTIVE:   HAND DOMINANCE: Right  ADLs: Overall ADLs: Independent with all bathing/dressing.    IADLs: Shopping: Not interested in going to the store, but would have gone prior to CVA Light housekeeping: is able to assist with household tasks when spouse asks for assistance or directs him to complete Meal Prep: Spouse is proving supervision for meal prep, but has been able to cook fish Community mobility: MD has not cleared pt to drive Medication management: Spouse administers medication, is having difficulty with administering B12 shot Financial management: Spouse is currently paying bills, but pt would have completed prior to CVA   COORDINATION: Finger Nose Finger test: mild ataxia, dysmetria LUE 9 Hole Peg test: Right: 32.37 sec; Left: 50.04 sec  Noted some difficulty with sequencing, requiring cues for recall and sequencing of directions Box and Blocks:  Right 38 blocks, Left 40 blocks (Pt frequently picking up 2-3 blocks at a time despite mod-max cues to pick up only one block at a time.    COGNITION: Overall cognitive status: Impaired Orientation Level: Person;Place;Situation Person: Oriented Place: Disoriented Situation: Disoriented Memory: Impaired Memory Impairment: Storage deficit;Retrieval deficit;Decreased recall of new information;Decreased short term memory Attention: Focused Awareness: Impaired Awareness Impairment: Intellectual impairment Problem Solving: Impaired Problem Solving Impairment: Verbal basic;Functional basic Behaviors: Restless;Impulsive Safety/Judgment: Impaired  OBSERVATIONS: Pt demonstrating attention at focused level during 9 hole peg test, requiring frequent cues and redirection to  attend to 2 step task with Box and Blocks.  Pt demonstrating decreased interest and motivation during OT evaluation, however pt and spouse agreeable to continue with OT at this time to focus on memory and return to engagement in IADLs and leisure pursuits.  TODAY'S TREATMENT:                                  08/31/22 Handwriting: focused and sustained attention with ability to maintain attention to writing prompt.  Pt demonstrating ability to attend to task for 1 minute, even in min distracting environment of treatment space.  However pt demonstrating increased micrographia as task continued.  OT educated on use of lined paper with focus on increasing letter size to utilize entire space.  Pt re-writing response to prompt with improved size and legibility, still getting smaller as he continued writing.  OT challenged memory and spontaneous answers with writing prompts to increase recall. Engaged in small peg board pattern replication with use of key to identify correlating colors, challenging sustained and alternating attention.  Pt demonstrating sustained attention for 4 mins without additional cues.  Pt making 4/24 errors with ability to correct first two before losing focus to complete.   Pt demonstrating good sequencing and organized scanning pattern.  Educated on functional carryover of task progressing towards alternating attention.    08/24/22 Attention: engaged in  small peg board pattern replication activity with challenge to sustain focus on task progressing to selective and alternating attention.  Pt demonstrated focused and selective attention to maintain focus on placing pegs into pegboard, however when OT increasing challenge to have pt engage in categorical naming.  Pt then only able to focus on placing pegs, requiring physical cues with OT blocking next peg hole to facilitate attention to task and naming of item.  Pt demonstrating ability to recall category 50% of time but with decreased recall  of items named after ~1-2 mins.  Working memory: engaged in portion of Guess in 10 challenging pt to ask questions to identify particular animal and then challenging pt to identify 3 letter animals, and naming food items alphabetically while alternating with therapist and walking for increased alternating attention.  Pt having to stop each time to think, only able to walk while therapist was completing her turn.   08/17/22 Trail Making A and B:  pt completed Trail making A in 1:06.69 sec with 1 error connecting 12-14.  Pt able to correct without cues.  Pt unable to complete Trail making B without mod-max cues for sequencing of alternating sequence.  Pt initially able to correct errors below number 5E, but afterwards pt would complete with no semblance of sequence.  Pt's spouse asking if he should do this task at home, discussed functional carryover with alternating attention with TV on or completing task while carrying on conversation. Memory games: engaged in naming activity with naming items by category to challenge thinking skills, attention, following directions, sequencing, and memory.  Pt requiring increased time to identify items in category, even when given familiar category.  Discussed game options such as [place], Guess in 4, [place] man, "I'm going on a trip..." as well as modifications to games to complete without need to purchase above games.   Educated on ways to modify games to increase/decrease challenge and how to increase from sustained to alternating to divided attention as able.    PATIENT EDUCATION: Education details: Educated on memory games to complete at home. Person educated: Patient and Spouse Education method: Explanation Education comprehension: verbalized understanding and needs further education  HOME EXERCISE PROGRAM: TBD   GOALS: Goals reviewed with patient? Yes  SHORT TERM GOALS: Target date: 09/03/22  Pt will be able to identify/verbalize steps and/or items needed  to complete simple functional task (simple snack prep, laundry task, etc) with min cues. Baseline: Goal status: IN PROGRESS  2.  Patient will be able to maintain selective attention for 2 mins during structured task without additional cues for attention.  Baseline:  Goal status: MET - 08/31/22  3.  Pt will complete table top scanning activity with min cues for technique Baseline:  Goal status: MET - 08/31/22  5.  Pt will be able to utilize phone to locate contacts and make emergency calls as needed without cues. Baseline:  Goal status: IN PROGRESS   LONG TERM GOALS: Target date: 09/24/22  Pt will demonstrate ability to sequence simple functional task (simple snack prep, laundry task, etc) at Mod I level. Baseline:  Goal status: IN PROGRESS  2.  Pt will navigate a moderately busy environment, following multi-step commands with 90% accuracy to simulate return to community reintegration. Baseline:  Goal status: IN PROGRESS  3.  Pt will be able to utilize phone to look up phone number for physician and/or other frequently used number/location without cues. Baseline:  Goal status: IN PROGRESS  4.  Pt will be  able to verbalize wayfinding/directions from home to familiar locations in the area without cues. Baseline:  Goal status: IN PROGRESS  5.  Pt will complete pill box assessment with <5 errors in 5 min time limit to demonstrate increased attention to task. Baseline:  Goal status: IN PROGRESS  6.  Pt will complete MOCA and demonstrate improved cognitive function on MOCA by 3 points. Baseline: TBD Goal status: IN PROGRESS  ASSESSMENT:  CLINICAL IMPRESSION: Pt demonstrating sustained and alternating attention this session with peg board pattern replication with use of legend for increased challenge.  Pt demonstrating improved attention and problem solving this session.  Pt demonstrating decreased legibility and micrographia with handwriting.  OT discussed use of external aids  (lined paper) to increase size with handwriting.  Discussed impaired attention and awareness possibly impacting size and legibility.    PERFORMANCE DEFICITS: in functional skills including IADLs, coordination, Fine motor control, Gross motor control, body mechanics, endurance, decreased knowledge of precautions, decreased knowledge of use of DME, and UE functional use, cognitive skills including attention, energy/drive, memory, orientation, problem solving, safety awareness, sequencing, and temperament/personality, and psychosocial skills including coping strategies, habits, and routines and behaviors.   IMPAIRMENTS: are limiting patient from IADLs and leisure.   CO-MORBIDITIES: may have co-morbidities  that affects occupational performance. Patient will benefit from skilled OT to address above impairments and improve overall function.  MODIFICATION OR ASSISTANCE TO COMPLETE EVALUATION: Min-Moderate modification of tasks or assist with assess necessary to complete an evaluation.  OT OCCUPATIONAL PROFILE AND HISTORY: Detailed assessment: Review of records and additional review of physical, cognitive, psychosocial history related to current functional performance.  CLINICAL DECISION MAKING: Moderate - several treatment options, min-mod task modification necessary  REHAB POTENTIAL: Good  EVALUATION COMPLEXITY: Moderate    PLAN:  OT FREQUENCY: 1-2x/week  OT DURATION: 6 weeks  PLANNED INTERVENTIONS: self care/ADL training, therapeutic exercise, therapeutic activity, balance training, functional mobility training, patient/family education, cognitive remediation/compensation, psychosocial skills training, energy conservation, coping strategies training, and DME and/or AE instructions  RECOMMENDED OTHER SERVICES: NA  CONSULTED AND AGREED WITH PLAN OF CARE: Patient and family member/caregiver  PLAN FOR NEXT SESSION: focused and selective attention - functional tasks with providing directions,  making phone calls, preparing simple meal.  Complete pill box assessment.     Simonne Come, OTR/L 08/31/2022, 2:11 PM

## 2022-09-01 ENCOUNTER — Encounter: Payer: Self-pay | Admitting: Physical Medicine & Rehabilitation

## 2022-09-01 ENCOUNTER — Encounter: Payer: Medicare HMO | Attending: Physical Medicine & Rehabilitation | Admitting: Physical Medicine & Rehabilitation

## 2022-09-01 VITALS — BP 126/75 | HR 54 | Ht 70.0 in | Wt 148.0 lb

## 2022-09-01 DIAGNOSIS — I6381 Other cerebral infarction due to occlusion or stenosis of small artery: Secondary | ICD-10-CM | POA: Diagnosis not present

## 2022-09-01 DIAGNOSIS — S31109S Unspecified open wound of abdominal wall, unspecified quadrant without penetration into peritoneal cavity, sequela: Secondary | ICD-10-CM | POA: Insufficient documentation

## 2022-09-01 DIAGNOSIS — I480 Paroxysmal atrial fibrillation: Secondary | ICD-10-CM | POA: Diagnosis not present

## 2022-09-01 NOTE — Progress Notes (Signed)
Subjective:    Patient ID: Douglas Cox, male    DOB: [birth year], 72 y.o.   MRN: [id number]  HPI  Mr. Hoeffner is here in follow up of his bilateral BG infarct. He has been busy with outpt therapies. He is doing some exercises on his own. He helps around the house. His wife says that he likes to watch a lot of TV. He also likes to facetime and gets on his computer frequently now that he has rediscovered the password.[name]  He denies any falls but slipped 1 time.  He is sleeping from 8pm'ish each night and wakes up around 8am. He sometimes will take a nap during the day.  His wife stated that he often would take a nap in the afternoon prior to the stroke.  His wife states that he does not always have a lot of motivation.  She feels that she has to help initiate things quite frequently.  The patient himself denies feeling depressed.  He has concerns about his micrographia. OT is working on his handwriting.  They are trying to increase the size.  He does do some work on his own at home.  The patient remains on Eliquis for stroke prophylaxis.  He has had questions about whether he can have a dental implant placed.  He has not followed up yet with cardiology or neurology.  He does report feeling cold since leaving the hospital.  He is also complaining about a area in his right groin from where his procedure was done on 07/17/2022.  The area is uncomfortable and still is slightly open.  He denies any regular drainage from that location.  The patient asked if he needed to be on all the medications he is currently taking.  These mostly consist of supplements and antilipid's as well as famotidine.  The patient also asked if he could drive.  Pain Inventory Average Pain 3 Pain Right Now 3 My pain is intermittent and stabbing  LOCATION OF PAIN  right groin (at surgical site)  BOWEL Number of stools per week: 2 Oral laxative use No  Type of laxative use fiber     BLADDER Normal   Mobility walk with assistance how many minutes can you walk? 30 ability to climb steps?  yes do you drive?  no Do you have any goals in this area?  yes  Function retired I need assistance with the following:  household duties Do you have any goals in this area?  yes  Neuro/Psych trouble walking  Prior Studies Any changes since last visit?  yes CT/MRI - chest in the [facility]  Physicians involved in your care Any changes since last visit?  no   History reviewed. No pertinent family history. Social History   Socioeconomic History   Marital status: Married    Spouse name: Not on file   Number of children: Not on file   Years of education: Not on file   Highest education level: Not on file  Occupational History   Not on file  Tobacco Use   Smoking status: Former    Types: Cigarettes    Quit date: 01/30/2006    Years since quitting: 16.5   Smokeless tobacco: Never  Vaping Use   Vaping Use: Never used  Substance and Sexual Activity   Alcohol use: Not Currently    Alcohol/week: 4.0 - 5.0 standard drinks of alcohol    Types: 4 - 5 Shots of liquor per week    Comment: 3  glasses of whiskey per night.   Drug use: Not Currently    Frequency: 7.0 times per week    Types: Marijuana   Sexual activity: Not Currently  Other Topics Concern   Not on file  Social History Narrative   Not on file   Social Determinants of Health   Financial Resource Strain: Not on file  Food Insecurity: No Food Insecurity (07/21/2022)   Hunger Vital Sign    Worried About Running Out of Food in the Last Year: Never true    Ran Out of Food in the Last Year: Never true  Transportation Needs: No Transportation Needs (07/21/2022)   PRAPARE - Hydrologist (Medical): No    Lack of Transportation (Non-Medical): No  Physical Activity: Not on file  Stress: Not on file  Social Connections: Not on file   Past Surgical History:   Procedure Laterality Date   CATARACT EXTRACTION Bilateral    Dr. Valetta Close   EYE SURGERY     FRACTURE SURGERY     GAS INSERTION Right 05/21/2021   Procedure: INSERTION OF GAS - C3F8;  Surgeon: Bernarda Caffey, MD;  Location: [place];  Service: Ophthalmology;  Laterality: Right;   GAS/FLUID EXCHANGE Right 05/21/2021   Procedure: GAS/FLUID EXCHANGE;  Surgeon: Bernarda Caffey, MD;  Location: [place];  Service: Ophthalmology;  Laterality: Right;   HERNIA REPAIR     IR CT HEAD LTD  07/17/2022   IR PERCUTANEOUS ART THROMBECTOMY/INFUSION INTRACRANIAL INC DIAG ANGIO  07/17/2022   IR US GUIDE VASC ACCESS RIGHT  07/17/2022   PARS PLANA VITRECTOMY Right 05/21/2021   Procedure: PARS PLANA VITRECTOMY WITH 25 GAUGE;  Surgeon: Bernarda Caffey, MD;  Location: [place];  Service: Ophthalmology;  Laterality: Right;   PERFLUORONE INJECTION Right 05/21/2021   Procedure: PERFLUORONE INJECTION;  Surgeon: Bernarda Caffey, MD;  Location: [place];  Service: Ophthalmology;  Laterality: Right;   PHOTOCOAGULATION WITH LASER Right 05/21/2021   Procedure: PHOTOCOAGULATION WITH LASER;  Surgeon: Bernarda Caffey, MD;  Location: [place];  Service: Ophthalmology;  Laterality: Right;   RADIOLOGY WITH ANESTHESIA N/A 07/17/2022   Procedure: IR WITH ANESTHESIA;  Surgeon: Radiologist, Medication, MD;  Location: [place];  Service: Radiology;  Laterality: N/A;   RETINAL DETACHMENT SURGERY     Past Medical History:  Diagnosis Date   Celiac disease    Retinal detachment    Ht 5\' 10"  (1.778 m)   Wt 148 lb (67.1 kg)   BMI 21.24 kg/m   Opioid Risk Score:   Fall Risk Score:  `1  Depression screen [facility] 2/9     09/01/2022   11:43 AM  Depression screen PHQ 2/9  Decreased Interest 0  Down, Depressed, Hopeless 0  PHQ - 2 Score 0  Altered sleeping 1  Tired, decreased energy 0  Change in appetite 0  Feeling bad or failure about yourself  1  Trouble concentrating 1  Moving slowly or fidgety/restless 0  PHQ-9 Score 3    Review of Systems   Constitutional:  Positive for unexpected weight change.       Weight loss  Gastrointestinal:  Positive for constipation.  All other systems reviewed and are negative.      Objective:   Physical Exam   Gen: no distress, normal appearing HEENT: oral mucosa pink and moist, NCAT Cardio: Reg rate Chest: normal effort, normal rate of breathing Abd: soft, non-distended Ext: no edema Psych: flat.  Delayed Skin: In his right groin there is a small  pinpoint area which appears to be slightly open without drainage.  With a tweezer I inspected this area there may be a retained suture but I was unable to explore deep enough to grab it. Neuro: alert. Slow to engage and initiate.  Does follow general commands.  Cannot provide graphical information.  Was oriented to date and place as well as reason.  Short-term memory generally fair.  Strength is 4 out of 5 in the upper extremities without gross ataxia.  Lower extremity strength grossly 4 - to 4 out of 5 but with inconsistent effort.  No focal sensory loss appreciated.  He might of had a mild intentional tremor in the upper extremities.  When he ambulated he tends to lurch to either side and walks without swinging his arms.  He does not appear to be at risk for falling.  Does fairly well with change of direction.  Normal abnormal resting tone .   Musculoskeletal: Full ROM, No pain with AROM or PROM in the neck, trunk, or extremities. Posture appropriate         Medical Problem List and Plan: 1. 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               -continue outpatient therapies.  He is making general progress with his mobility.  -We discussed prognosis quite a bit and he still has some work to do with his initiation and awareness.  -I like to see his balance improved as well.  -Discussed his writing which should improve as  well with time.  Encouraged ongoing efforts with occupational therapy.  -Advance diet per speech therapy.  -He is not ready to drive at this point 2.  Atrial fibrillation: eliquis started   -cardiology follow-up pending -NSR today on exam -He would need to be off Eliquis at least 2 or 3 days prior to any procedure.  He will need to discuss the plan with the dentist as well as  cardiology and neurology. 3. Pain Management: Tylenol as needed 4.  Affect/sleep: Mostly driven by stroke.  Encourage patient to increase his initiation and to push himself more into situations he is not completely comfortable with.  I do feel there is a element of depression and anxiety as well.            -Discussed his sleep at length today and this is generally normal after a stroke.  He is generally doing well after his sleep when he gets up in the morning.  I would expect this to improve somewhat over the next several months.  If he does complain of the ongoing fatigue or need for excessive sleep we can consider endocrine workup.   7. Skin: made referral to IR for look at right groin.  I think there may be a retained suture underneath the skin.  I did not have the tools in the office to probe any further today. 8.  Cold intolerance: This may be related to his stroke and even possibly his Eliquis.  -Encouraged warm clothing and continued activity.  -If continued can pursue lab workup at a later date 60.  Hx of COVID:   10.  Hyperlipidemia.  continue Crestor 20mg  11. HTN. Controlled off meds             -controlled overall, continue to [place]. Hypocalcemia: 1 tablet tums started daily  45 minutes of face to face patient  care time were spent during this visit. All questions were encouraged and answered. Follow up with me in 2 mos.

## 2022-09-01 NOTE — Patient Instructions (Addendum)
ALWAYS FEEL FREE TO CALL OUR OFFICE WITH ANY PROBLEMS OR QUESTIONS ([phone])  **PLEASE NOTE** ALL MEDICATION REFILL REQUESTS (INCLUDING CONTROLLED SUBSTANCES) NEED TO BE MADE AT LEAST 7 DAYS PRIOR TO REFILL BEING DUE. ANY REFILL REQUESTS INSIDE THAT TIME FRAME MAY RESULT IN DELAYS IN RECEIVING YOUR PRESCRIPTION.    Vickie Epley, MD Follow up.   Specialties: Cardiology, Radiology Why: Call for appointment Contact information: [address] [address] [phone]    Check with dentist regarding how long he needs to be off eliquis prior to implant.   Neurology and cardiology need to decide on whether he can come off in short term.

## 2022-09-03 ENCOUNTER — Encounter: Payer: Medicare Other | Admitting: Gastroenterology

## 2022-09-03 ENCOUNTER — Ambulatory Visit: Payer: Medicare HMO | Attending: Physician Assistant

## 2022-09-03 ENCOUNTER — Other Ambulatory Visit (HOSPITAL_COMMUNITY): Payer: Self-pay

## 2022-09-03 DIAGNOSIS — R2689 Other abnormalities of gait and mobility: Secondary | ICD-10-CM | POA: Diagnosis present

## 2022-09-03 DIAGNOSIS — R293 Abnormal posture: Secondary | ICD-10-CM | POA: Diagnosis present

## 2022-09-03 DIAGNOSIS — R1312 Dysphagia, oropharyngeal phase: Secondary | ICD-10-CM | POA: Diagnosis present

## 2022-09-03 DIAGNOSIS — R2681 Unsteadiness on feet: Secondary | ICD-10-CM | POA: Insufficient documentation

## 2022-09-03 DIAGNOSIS — R41841 Cognitive communication deficit: Secondary | ICD-10-CM | POA: Diagnosis present

## 2022-09-03 DIAGNOSIS — R41840 Attention and concentration deficit: Secondary | ICD-10-CM | POA: Diagnosis present

## 2022-09-03 DIAGNOSIS — M6281 Muscle weakness (generalized): Secondary | ICD-10-CM | POA: Insufficient documentation

## 2022-09-03 DIAGNOSIS — I69318 Other symptoms and signs involving cognitive functions following cerebral infarction: Secondary | ICD-10-CM | POA: Diagnosis present

## 2022-09-05 NOTE — Therapy (Signed)
OUTPATIENT SPEECH LANGUAGE PATHOLOGY TREATMENT   Patient Name: Douglas Cox MRN: [id number] DOB:08-22-1950, 72 y.o., male Today's Date: 09/05/2022  PCP: None Recorded REFERRING PROVIDER: Claudette Laws, MD  END OF SESSION:  End of Session - 09/05/22 1714     Visit Number 7    Number of Visits 25    Date for SLP Re-Evaluation 11/09/22    SLP Start Time 0935    SLP Stop Time  1010    SLP Time Calculation (min) 35 min    Activity Tolerance Patient tolerated treatment well                 Past Medical History:  Diagnosis Date   Celiac disease    Retinal detachment    Past Surgical History:  Procedure Laterality Date   CATARACT EXTRACTION Bilateral    Dr. Cathey Endow   EYE SURGERY     FRACTURE SURGERY     GAS INSERTION Right 05/21/2021   Procedure: INSERTION OF GAS - C3F8;  Surgeon: Rennis Chris, MD;  Location: [facility] OR;  Service: Ophthalmology;  Laterality: Right;   GAS/FLUID EXCHANGE Right 05/21/2021   Procedure: GAS/FLUID EXCHANGE;  Surgeon: Rennis Chris, MD;  Location: [facility] OR;  Service: Ophthalmology;  Laterality: Right;   HERNIA REPAIR     IR CT HEAD LTD  07/17/2022   IR PERCUTANEOUS ART THROMBECTOMY/INFUSION INTRACRANIAL INC DIAG ANGIO  07/17/2022   IR US GUIDE VASC ACCESS RIGHT  07/17/2022   PARS PLANA VITRECTOMY Right 05/21/2021   Procedure: PARS PLANA VITRECTOMY WITH 25 GAUGE;  Surgeon: Rennis Chris, MD;  Location: [facility] OR;  Service: Ophthalmology;  Laterality: Right;   PERFLUORONE INJECTION Right 05/21/2021   Procedure: PERFLUORONE INJECTION;  Surgeon: Rennis Chris, MD;  Location: [facility] OR;  Service: Ophthalmology;  Laterality: Right;   PHOTOCOAGULATION WITH LASER Right 05/21/2021   Procedure: PHOTOCOAGULATION WITH LASER;  Surgeon: Rennis Chris, MD;  Location: [facility] OR;  Service: Ophthalmology;  Laterality: Right;   RADIOLOGY WITH ANESTHESIA N/A 07/17/2022   Procedure: IR WITH ANESTHESIA;  Surgeon: Radiologist, Medication, MD;  Location: [facility] OR;  Service:  Radiology;  Laterality: N/A;   RETINAL DETACHMENT SURGERY     Patient Active Problem List   Diagnosis Date Noted   Paroxysmal atrial fibrillation (HCC) 09/01/2022   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 (HCC) 07/22/2022   Protein-calorie malnutrition, severe 07/19/2022   Stroke (cerebrum) (HCC) 07/17/2022   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   Celiac disease    Alcohol dependence (HCC)    Bradycardia     ONSET DATE: 07/17/22   REFERRING DIAG: I63.81 (ICD-10-CM) - 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  TNK given and recanalization acheived with thrombectomy.  Later at 1800 noted right sided weakness and new CT Head and CTA head and neck reordered and negative for reocclusion. MRI today shows bilateral basal ganglia infarcts. ASA 81mg  started 12/17  Pt with ETT for thrombectomy and overnight.  Extubated 12/17    PAIN:  Are you having pain? Yes: NPRS scale: 8/10 Pain location: lt back top  teeth Pain description: sore    PATIENT GOALS: Pt did not answer question when asked, due to attention/processing deficits  OBJECTIVE:   DIAGNOSTIC FINDINGS:  FEES 07/30/22 Pt demonstrates a moderate oropharyngeal dysphagia that is impacted by patient's current cognitive deficits. Patient's oral phase is characterized by poor awareness of bolus resulting in inconsistent timing of swallowing trigger. With solids, patient needed verbal cues (snapping and counting) to attend and  initiate a swallow with all boluses contained in the velleculae. With liquids, patient consistently triggered his swallow at the pyriform sinuses. This resulted in sensed aspiration of thin liquids via cup. Patient also with inconsistent amounts of pharyngeal residue that cleared with a 2nd subsequent swallow. No penetration or aspiration noted with nectar-thick liquids except for 1 episode of flash penetration. Provided thorough education to the patient's wife regarding patient's current swallowing function and that he is currently at a higher risk of aspiration due to cognitive deficits. She verbalized understanding and agreement with recommendations of continuing Dys. 3 textures but upgrading to nectar-thick liquids via cup with strict aspiration precautions and supervision. This will hopefully improve patient's intake of liquids with improved ability to stay hydrated without IV fluids prior to potential discharge on 08/03/22. SLP Diet Recommendations Dysphagia 3 (Mech soft) solids;Nectar thick liquid Liquid Administration via Cup;Spoon;No straw Medication Administration Crushed with puree Compensations: Slow rate;Small sips/bites;Minimize environmental distractions;Multiple dry swallows after each bite/sip  Today, pt ate dys III and drank nectar liqiuds. See "clinicial impressions" for results.  STANDARDIZED ASSESSMENTS: CLQT: may need to be completed in first 1-3 sessions  PATIENT REPORTED OUTCOME MEASURES (PROM): Cognitive function: Short Form: to be administered in first 1-3 sessions   TODAY'S TREATMENT:                                                                                                                                         DATE:  09/03/22: Pt recalled 3 iof his questions and the answers from Dr. Naaman Plummer appointment spontaneously. SLP and wife provided the other two with min-mod A and total A. Pt recalled one answer from these two spontaneously. Pt held attention for 10 minutes in  conversation about this appointment and about exercise.   08/31/22: Pt and wife deny cont'd overt s/sx aspiration with meals. Pt still does not take meds with water due to "They are bitter." [name] said pt has Dr. Naaman Plummer tomorrow so SLP had pt write down questions he has for Dr. Naaman Plummer. He thought of 2 spontaneously (5 with mod-max cues), and req'd consistent extra time to think of questions, mod A consistently for legible writing (decr'd emergent awareness), mod A usually for writing question in a format he would recall tomorrow (decr'd anticipatory awareness). [name] remarked that often she is having to encourage pt to get up from the chair to do things during the day. SLP strongly suggested a schedule and provided example of this, and also  reiterated that pt will have decr'd ability to initiate tasks at this time and may require wife's cues to do daily tasks. SLP provided example of SLP calling pt's name in waiting room and him not arising until wife arises. Pt showing some incr'd awareness asking SLP if he will be able to return to things he was doing pre-CVA. 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  1/25:24: Wife asked if pt was safe for thin liquids. SLP told pt/wife that if pt took single sips and was not exhibiting coughing he could have thin liquids. SLP to assess next session. In a quiet environment, pt completed written math task for 9 minutes prior to requiring 5 minute break, evidenced by difficulty with/inability in adding simple figures. Pt impulsive about starting back to task again and req'd SLP cueing for longer break. After 6 minutes, pt said, "Get back to this again," and re-focused on written math task- processing speed improved for 3 more entries until pt had to leave. SLP educated/highlighted to pt and wife the importance of taking breaks when he feels "stuck".   08/24/22: speech/cognition: Pt demonstrated sustained attention for 7  minutes today for therapy task of writing down reasons for each med. He demonstrated attention adequate to recall 3/5 reasons 2 minutes later, when given extra time, and required mod A with other two. He continues to perform attention lengthening tasks at home. Wife has downloaded [employer] to her phone and has directed pt to complete. Pt rec'd cont'd rationale today for his meds - he "does not like taking pills." Pt inquired reason for CVA today in session and was told by wife and SLP. Today, pt's reasoning questionable with rationale to not take med/s with pureed but pt indicated he did not enjoy the smoothies wife was making for him with meds in them.  Swallowing: SLP discussed ways to assist pt in taking meds as they are currently being crushed and put into a smoothie for pt; "Do I have to have the smoothies" pt inquired. SLP suggested meds with applesauce, and yogurt, one pureed item at a time, and pt declined each time. SLP, pt, and wife eventually agreed pt may attempt one med with pureed item until next session.  08/19/22:SLP targeted pt's attention in session today. He made poached egg so SLP asked for steps for making poached egg. Pt req'd occasional mod cues back to task with SLP using written cues. Pt expressive language is slow due to decr'd linguistic processing skills/decr'd attention.  SLP asked what pt had done in last 24 hours and req'd usual max A other than "watched [place]." SLP told pt/wife that </= 2 or 2 1/2 hours of TV a day are ok, but pt needs to engage his brain. SLP drew graph of progress of someone who engages their brain in cognitive/life tasks and someone who watches TV first 3 months post-CVA. [name] thanked SLP for this graphic/visual cue. SLP strongly encouraged pt to engage in tasks other than watching TV. SLP asked pt for names of 5 spices and pt demo'd decr'd sustained/selective attention as he wrote "peppercini" - aphasic response(?) but emergent awareness poor  at this time so pt did not realize he had made error.   08/17/22: Swallowing: Discussion today on rationale for thickened liquids to reinforce to pt to remain on nectar thick liquids due to risk of aspiration. Educated on why wet voice was necessary to clear throat/cough and  reswallow. SLP then worked with pt on a strong throat clear or cough and pt demonstrated x2/3 attempts. This did not carry over unless cued by SLP that pt had "wet" voice. Pt unsuccessfully attempted throat clear or cough without a demo by SLP. Wife stated she needs to demo that more at home instead of just verbally cue. SLP confirmed this.  Speech: Sustained attention noted for up to 90 seconds with route finding with visual support. Without visual support pt consistently with <60 seconds attention. Education on attention tasks to do at home, work in 15 minute increments (at least 6/day), signs that a break is necessary, and that OT and ST are to utilize neuroplasticity - SLP defined neuroplasticity for pt and wife.  08/11/22 (eval): SLP discussed deficits in attention and highlighted it as being pt's most significant deficit area. SLP educated wife re: how to cue pt for swallowing/meals. Assured [name] multiple cues during a meal is necessary at this time due to poor sustained/selective attention. "I have to tell him a lot," she stated. After pt told SLP he didn't know when his CVA was, SLP ahd him write the date on paper in front of him. Two minutes later pt req'd cue to look at paper to answer same question. In subsequent trials pt looked at paper independently, 4 minutes later, 5 minutes later, and 8 minutes later. SLP shared with [name] that repetition may be beneficial for pt's memory, and encouraged her to cont to cue pt for small sips and double swallows.    PATIENT EDUCATION: Education details: see "today's treatment" Person educated: Patient and Spouse Education method: Explanation and Demonstration Education comprehension:  verbalized understanding and needs further education   GOALS: Goals reviewed with patient? No  SHORT TERM GOALS: Target date: 09/10/22  Pt will follow aspiration precautions from FEES with occasional min A in 2 sessions Baseline: Goal status: Ongoing  2.  Dionicio will demo sustained attention to participate in 10 minute therapy task x3/session, in 3 sessions Baseline: 09/03/22 Goal status: Ongoing  3.  Pt will demo understanding of written cues in simple therapy tasks, with initial cue allowed, in 3 sessions Baseline: 08-19-22, 09-03-22 Goal status: Ongoing  4.  Pt will demo selective attention for 10 minutes to a simple therapy task in a min-mod noisy environment Baseline:  Goal status: Ongoing  5.  Pt/wife will tell SLP 3 overt s/sx aspiration PNA with modified independence Baseline:  Goal status: Ongoing  6   Using compensations, pt will ask wife to give him his medications, at least once/day between 3 sessions Baseline:  Goal status: Ongoing   LONG TERM GOALS: Target date: 11/09/22  Pt will follow aspiration precautions from FEES with rare min A in 3 sessions Baseline:  Goal status: Ongoing  2.  Pt will follow aspiration precautions from FEES with modified independence in 2 sessions Baseline:  Goal status: Ongoing  3.  Pt will demo selective attention for 20 minutes to a simple therapy task in a min-mod noisy environment Baseline:  Goal status: Ongoing  4.  Pt will demo emergent awareness in simple therapy tasks with verbal cue for error awareness in 3 sessions Baseline:  Goal status: Ongoing  5.  Pt will demo alternating attention for 20 minutes to complete simple-mod complex therapy tasks 100% accuracy in 3 sessions Baseline:  Goal status: Ongoing  6.  Using compensations, pt will recall to administer his own medications correctly, at least once/day between 3 sessions Baseline:  Goal status: Ongoing  7.  Wife's PROM (due to pt's attention deficit) score for pt  will improve in last 1-2 sessions than the score taken in the first 1-2 sessions Baseline:  Goal status: Ongoing  ASSESSMENT:  CLINICAL IMPRESSION: Patient is a 72 y.o. male who was seen today for treatment of simple cognition (primarily attention) and for swallowing skills in light of CVA 07-17-22. Sustained attention noted for 10 minutes in conversation. Pt swallowing is improving as cognition improves. SLP observed pt's making a joke today and smiling more during therapy.   OBJECTIVE IMPAIRMENTS: include attention, memory, awareness, executive functioning, receptive language, and dysphagia. These impairments are limiting patient from return to work, managing medications, managing appointments, managing finances, household responsibilities, ADLs/IADLs, effectively communicating at home and in community, and safety when swallowing. Factors affecting potential to achieve goals and functional outcome are ability to learn/carryover information, cooperation/participation level, and severity of impairments.. Patient will benefit from skilled SLP services to address above impairments and improve overall function.  REHAB POTENTIAL: Good  PLAN:  SLP FREQUENCY: 2x/week  SLP DURATION: 12 weeks  PLANNED INTERVENTIONS: Aspiration precaution training, Pharyngeal strengthening exercises, Diet toleration management , Environmental controls, Trials of upgraded texture/liquids, Cueing hierachy, Cognitive reorganization, Internal/external aids, Multimodal communication approach, SLP instruction and feedback, Compensatory strategies, and Patient/family education    [facility], CCC-SLP 09/05/2022, 5:14 PM

## 2022-09-06 ENCOUNTER — Ambulatory Visit: Payer: Medicare HMO | Admitting: Occupational Therapy

## 2022-09-06 ENCOUNTER — Ambulatory Visit: Payer: Medicare HMO

## 2022-09-06 ENCOUNTER — Other Ambulatory Visit (HOSPITAL_COMMUNITY): Payer: Self-pay

## 2022-09-06 DIAGNOSIS — R41840 Attention and concentration deficit: Secondary | ICD-10-CM

## 2022-09-06 DIAGNOSIS — R41841 Cognitive communication deficit: Secondary | ICD-10-CM | POA: Diagnosis not present

## 2022-09-06 DIAGNOSIS — R1312 Dysphagia, oropharyngeal phase: Secondary | ICD-10-CM

## 2022-09-06 DIAGNOSIS — I69318 Other symptoms and signs involving cognitive functions following cerebral infarction: Secondary | ICD-10-CM

## 2022-09-06 NOTE — Therapy (Signed)
OUTPATIENT OCCUPATIONAL THERAPY  Treatment Note  Patient Name: Douglas Cox MRN: [id number] DOB:[birth year], 72 y.o., male Today's Date: 09/06/2022  PCP: Dr. Lorrene Reid ([facility]) REFERRING PROVIDER: Erick Colace, MD  END OF SESSION:  OT End of Session - 09/06/22 1319     Visit Number 5    Number of Visits 15    Date for OT Re-Evaluation 10/08/22    Authorization Type [employer]    OT Start Time 1318    OT Stop Time 1405    OT Time Calculation (min) 47 min    Activity Tolerance Patient tolerated treatment well    Behavior During Therapy Flat affect;Restless                 Past Medical History:  Diagnosis Date   Celiac disease    Retinal detachment    Past Surgical History:  Procedure Laterality Date   CATARACT EXTRACTION Bilateral    Dr. Cathey Endow   EYE SURGERY     FRACTURE SURGERY     GAS INSERTION Right 05/21/2021   Procedure: INSERTION OF GAS - C3F8;  Surgeon: Rennis Chris, MD;  Location: [facility] OR;  Service: Ophthalmology;  Laterality: Right;   GAS/FLUID EXCHANGE Right 05/21/2021   Procedure: GAS/FLUID EXCHANGE;  Surgeon: Rennis Chris, MD;  Location: [facility] OR;  Service: Ophthalmology;  Laterality: Right;   HERNIA REPAIR     IR CT HEAD LTD  07/17/2022   IR PERCUTANEOUS ART THROMBECTOMY/INFUSION INTRACRANIAL INC DIAG ANGIO  07/17/2022   IR US GUIDE VASC ACCESS RIGHT  07/17/2022   PARS PLANA VITRECTOMY Right 05/21/2021   Procedure: PARS PLANA VITRECTOMY WITH 25 GAUGE;  Surgeon: Rennis Chris, MD;  Location: [facility] OR;  Service: Ophthalmology;  Laterality: Right;   PERFLUORONE INJECTION Right 05/21/2021   Procedure: PERFLUORONE INJECTION;  Surgeon: Rennis Chris, MD;  Location: [facility] OR;  Service: Ophthalmology;  Laterality: Right;   PHOTOCOAGULATION WITH LASER Right 05/21/2021   Procedure: PHOTOCOAGULATION WITH LASER;  Surgeon: Rennis Chris, MD;  Location: [facility] OR;  Service: Ophthalmology;  Laterality: Right;   RADIOLOGY WITH ANESTHESIA N/A 07/17/2022    Procedure: IR WITH ANESTHESIA;  Surgeon: Radiologist, Medication, MD;  Location: [facility] OR;  Service: Radiology;  Laterality: N/A;   RETINAL DETACHMENT SURGERY     Patient Active Problem List   Diagnosis Date Noted   Paroxysmal atrial fibrillation (HCC) 09/01/2022   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 (HCC) 07/22/2022   Protein-calorie malnutrition, severe 07/19/2022   Stroke (cerebrum) (HCC) 07/17/2022   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   Celiac disease    Alcohol dependence (HCC)    Bradycardia     ONSET DATE: 08/04/22 (referral date)  REFERRING DIAG: I63.81 (ICD-10-CM) - 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   Pt accompanied by: self and significant other (spouse)  PERTINENT HISTORY: 72 y.o. male with a PMHx of cataracts, celiac disease and retinal detachment who presents to the ED with acute onset of left hemiplegia, right gaze deviation and left hemineglect.MRI: Acute/subacute nonhemorrhagic infarct of the right caudate head and lentiform nucleus; acute/subacute nonhemorrhagic infarct of the left caudate head and lentiform nucleus spanning the internal capsule; Acute/subacute nonhemorrhagic infarct of the cortex of the right temporal tip.  PRECAUTIONS:  Fall  WEIGHT BEARING RESTRICTIONS: No  PAIN:  Are you having pain? No  FALLS: Has patient fallen in last 6 months? Yes. Number of falls 2 - 1 when he had the stroke he fell forward and hit his head and fell off a ladder  LIVING ENVIRONMENT: Lives with: lives with their spouse Lives in: House/apartment Stairs: Yes: Internal: 16 steps; on right going up and External: 4 steps to front  door, 16 steps from garage to main floor of home steps; on right going up Has following equipment at home: Single point cane  PLOF: Independent, working as an Pension scheme manager  PATIENT GOALS: to improve thought process, speech  OBJECTIVE:   HAND DOMINANCE: Right  ADLs: Overall ADLs: Independent with all bathing/dressing.    IADLs: Shopping: Not interested in going to the store, but would have gone prior to CVA Light housekeeping: is able to assist with household tasks when spouse asks for assistance or directs him to complete Meal Prep: Spouse is proving supervision for meal prep, but has been able to cook fish Community mobility: MD has not cleared pt to drive Medication management: Spouse administers medication, is having difficulty with administering B12 shot Financial management: Spouse is currently paying bills, but pt would have completed prior to CVA   COORDINATION: Finger Nose Finger test: mild ataxia, dysmetria LUE 9 Hole Peg test: Right: 32.37 sec; Left: 50.04 sec  Noted some difficulty with sequencing, requiring cues for recall and sequencing of directions Box and Blocks:  Right 38 blocks, Left 40 blocks (Pt frequently picking up 2-3 blocks at a time despite mod-max cues to pick up only one block at a time.    COGNITION: Overall cognitive status: Impaired Orientation Level: Person;Place;Situation Person: Oriented Place: Disoriented Situation: Disoriented Memory: Impaired Memory Impairment: Storage deficit;Retrieval deficit;Decreased recall of new information;Decreased short term memory Attention: Focused Awareness: Impaired Awareness Impairment: Intellectual impairment Problem Solving: Impaired Problem Solving Impairment: Verbal basic;Functional basic Behaviors: Restless;Impulsive Safety/Judgment: Impaired  OBSERVATIONS: Pt demonstrating attention at focused level during 9 hole peg test, requiring frequent cues and redirection to attend to 2 step task with Box and Blocks.   Pt demonstrating decreased interest and motivation during OT evaluation, however pt and spouse agreeable to continue with OT at this time to focus on memory and return to engagement in IADLs and leisure pursuits.  TODAY'S TREATMENT:                                  09/06/22 Pill box assessment: Completed in 5:55 with ability to correct one error with prescription of "every other day" without cues.  Pt also choosing to place "twice a day with breakfast and dinner" in morn and bed slot, completing bed slot after 5 min time limit expired.  OT engaged in discussion with pt and spouse on specific prescription and pt's rationale for placing in bed slot vs evening/dinner slot.  Discussed possible implications of changing times on prescribed medications.  Encouraged pt and wife to complete meds together to increase pt participation and "ownership" of medication management.   Organization/handwriting: engaged in complex planning/problem solving prompt requiring pt to organize items into systematic schedule to complete all in time allotted.  Discussed prioritizing activities to ensure completion of tasks.  Pt requiring mod assist to setup task with OT listing all tasks with pt then able to write order of stops on list with times noted.  Pt demonstrating legible handwriting, however  still with intermittent micrographia.  Pt benefiting from mod cues for organization and prioritization of tasks based on complexity and importance.  OT encouraged pt to prioritize daily tasks to increase initiation and participation in more complex sequencing.    08/31/22 Handwriting: focused and sustained attention with ability to maintain attention to writing prompt.  Pt demonstrating ability to attend to task for 1 minute, even in min distracting environment of treatment space.  However pt demonstrating increased micrographia as task continued.  OT educated on use of lined paper with focus on increasing letter size to utilize entire  space.  Pt re-writing response to prompt with improved size and legibility, still getting smaller as he continued writing.  OT challenged memory and spontaneous answers with writing prompts to increase recall. Engaged in small peg board pattern replication with use of key to identify correlating colors, challenging sustained and alternating attention.  Pt demonstrating sustained attention for 4 mins without additional cues.  Pt making 4/24 errors with ability to correct first two before losing focus to complete.   Pt demonstrating good sequencing and organized scanning pattern.  Educated on functional carryover of task progressing towards alternating attention.    08/24/22 Attention: engaged in small peg board pattern replication activity with challenge to sustain focus on task progressing to selective and alternating attention.  Pt demonstrated focused and selective attention to maintain focus on placing pegs into pegboard, however when OT increasing challenge to have pt engage in categorical naming.  Pt then only able to focus on placing pegs, requiring physical cues with OT blocking next peg hole to facilitate attention to task and naming of item.  Pt demonstrating ability to recall category 50% of time but with decreased recall of items named after ~1-2 mins.  Working memory: engaged in portion of [place] in 10 challenging pt to ask questions to identify particular animal and then challenging pt to identify 3 letter animals, and naming food items alphabetically while alternating with therapist and walking for increased alternating attention.  Pt having to stop each time to think, only able to walk while therapist was completing her turn.  PATIENT EDUCATION: Education details: Educated on Estate agent during daily tasks Person educated: Patient and Spouse Education method: Explanation Education comprehension: verbalized understanding and needs further education  HOME EXERCISE  PROGRAM: TBD   GOALS: Goals reviewed with patient? Yes  SHORT TERM GOALS: Target date: 09/03/22  Pt will be able to identify/verbalize steps and/or items needed to complete simple functional task (simple snack prep, laundry task, etc) with min cues. Baseline: Goal status: IN PROGRESS  2.  Patient will be able to maintain selective attention for 2 mins during structured task without additional cues for attention.  Baseline:  Goal status: MET - 08/31/22  3.  Pt will complete table top scanning activity with min cues for technique Baseline:  Goal status: MET - 08/31/22  5.  Pt will be able to utilize phone to locate contacts and make emergency calls as needed without cues. Baseline:  Goal status: IN PROGRESS   LONG TERM GOALS: Target date: 09/24/22  Pt will demonstrate ability to sequence simple functional task (simple snack prep, laundry task, etc) at Mod I level. Baseline:  Goal status: IN PROGRESS  2.  Pt will navigate a moderately busy environment, following multi-step commands with 90% accuracy to simulate return to community reintegration. Baseline:  Goal status: IN PROGRESS  3.  Pt will be able to utilize phone to look up phone number for physician  and/or other frequently used number/location without cues. Baseline:  Goal status: IN PROGRESS  4.  Pt will be able to verbalize wayfinding/directions from home to familiar locations in the area without cues. Baseline:  Goal status: IN PROGRESS  5.  Pt will complete pill box assessment with <5 errors in 5 min time limit to demonstrate increased attention to task. Baseline:  Goal status: IN PROGRESS  6.  Pt will complete MOCA and demonstrate improved cognitive function on MOCA by 3 points. Baseline: TBD Goal status: IN PROGRESS  ASSESSMENT:  CLINICAL IMPRESSION: Pt demonstrating sustained and alternating attention this session with pill box assessment.  Pt requiring increased time and making one error that he  rationalized.  Pt and spouse receptive to education on increasing pt participation in medication administering to increase initiation and awareness, reiterated completing together at this time to reduce errors.  Pt with increased difficulty with complex organizing/planning activity, requiring mod cues and assist to chunk activity to increase success.  Pt demonstrating awareness about some aspects, however requiring cues for spacing and timeline according to prompt.  Discussed how to carry task over to functional, routine tasks and implications with pt reporting understanding. Pt's spouse asking about PT referral, discussed initial findings and discussed utilizing [employer] option to join a gym for use of machines and/or appropriate exercise classes.   PERFORMANCE DEFICITS: in functional skills including IADLs, coordination, Fine motor control, Gross motor control, body mechanics, endurance, decreased knowledge of precautions, decreased knowledge of use of DME, and UE functional use, cognitive skills including attention, energy/drive, memory, orientation, problem solving, safety awareness, sequencing, and temperament/personality, and psychosocial skills including coping strategies, habits, and routines and behaviors.   IMPAIRMENTS: are limiting patient from IADLs and leisure.   CO-MORBIDITIES: may have co-morbidities  that affects occupational performance. Patient will benefit from skilled OT to address above impairments and improve overall function.  MODIFICATION OR ASSISTANCE TO COMPLETE EVALUATION: Min-Moderate modification of tasks or assist with assess necessary to complete an evaluation.  OT OCCUPATIONAL PROFILE AND HISTORY: Detailed assessment: Review of records and additional review of physical, cognitive, psychosocial history related to current functional performance.  CLINICAL DECISION MAKING: Moderate - several treatment options, min-mod task modification necessary  REHAB POTENTIAL:  Good  EVALUATION COMPLEXITY: Moderate    PLAN:  OT FREQUENCY: 1-2x/week  OT DURATION: 6 weeks  PLANNED INTERVENTIONS: self care/ADL training, therapeutic exercise, therapeutic activity, balance training, functional mobility training, patient/family education, cognitive remediation/compensation, psychosocial skills training, energy conservation, coping strategies training, and DME and/or AE instructions  RECOMMENDED OTHER SERVICES: NA  CONSULTED AND AGREED WITH PLAN OF CARE: Patient and family member/caregiver  PLAN FOR NEXT SESSION: focused and selective attention - functional tasks with providing directions, making phone calls, preparing simple meal.  Complex/simple organization/planning   Brenlynn Fake, [place], OTR/L 09/06/2022, 3:25 PM

## 2022-09-06 NOTE — Therapy (Signed)
OUTPATIENT SPEECH LANGUAGE PATHOLOGY TREATMENT   Patient Name: Douglas Cox MRN: [id number] DOB:Jan 04, 1951, 72 y.o., male Today's Date: 09/06/2022  PCP: None Recorded REFERRING PROVIDER: Claudette Laws, MD  END OF SESSION:  End of Session - 09/06/22 1240     Visit Number 8    Number of Visits 25    Date for SLP Re-Evaluation 11/09/22    SLP Start Time 1236    SLP Stop Time  1315    SLP Time Calculation (min) 39 min    Activity Tolerance Patient tolerated treatment well                 Past Medical History:  Diagnosis Date   Celiac disease    Retinal detachment    Past Surgical History:  Procedure Laterality Date   CATARACT EXTRACTION Bilateral    Dr. Cathey Endow   EYE SURGERY     FRACTURE SURGERY     GAS INSERTION Right 05/21/2021   Procedure: INSERTION OF GAS - [id number];  Surgeon: Rennis Chris, MD;  Location: [facility] OR;  Service: Ophthalmology;  Laterality: Right;   GAS/FLUID EXCHANGE Right 05/21/2021   Procedure: GAS/FLUID EXCHANGE;  Surgeon: Rennis Chris, MD;  Location[facility] OR;  Service: Ophthalmology;  Laterality: Right;   HERNIA REPAIR     IR CT HEAD LTD  07/17/2022   IR PERCUTANEOUS ART THROMBECTOMY/INFUSION INTRACRANIAL INC DIAG ANGIO  07/17/2022   IR US GUIDE VASC ACCESS RIGHT  07/17/2022   PARS PLANA VITRECTOMY Right 05/21/2021   Procedure: PARS PLANA VITRECTOMY WITH 25 GAUGE;  Surgeon: Rennis Chris, MD;  Location: [facility] OR;  Service: Ophthalmology;  Laterality: Right;   PERFLUORONE INJECTION Right 05/21/2021   Procedure: PERFLUORONE INJECTION;  Surgeon: Rennis Chris, MD;  Location: [facility] OR;  Service: Ophthalmology;  Laterality: Right;   PHOTOCOAGULATION WITH LASER Right 05/21/2021   Procedure: PHOTOCOAGULATION WITH LASER;  Surgeon: Rennis Chris, MD;  Location: [facility] OR;  Service: Ophthalmology;  Laterality: Right;   RADIOLOGY WITH ANESTHESIA N/A 07/17/2022   Procedure: IR WITH ANESTHESIA;  Surgeon: Radiologist, Medication, MD;  Location: [facility] OR;  Service:  Radiology;  Laterality: N/A;   RETINAL DETACHMENT SURGERY     Patient Active Problem List   Diagnosis Date Noted   Paroxysmal atrial fibrillation (HCC) 09/01/2022   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 (HCC) 07/22/2022   Protein-calorie malnutrition, severe 07/19/2022   Stroke (cerebrum) (HCC) 07/17/2022   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   Celiac disease    Alcohol dependence (HCC)    Bradycardia     ONSET DATE: 07/17/22   REFERRING DIAG: I63.81 (ICD-10-CM) - 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  TNK given and recanalization acheived with thrombectomy.  Later at 1800 noted right sided weakness and new CT Head and CTA head and neck reordered and negative for reocclusion. MRI today shows bilateral basal ganglia infarcts. ASA 81mg  started 12/17  Pt with ETT for thrombectomy and overnight.  Extubated 12/17    PAIN:  Are you having pain? No   PATIENT GOALS: Pt did not answer question when asked, due to attention/processing deficits  OBJECTIVE:   DIAGNOSTIC FINDINGS:  FEES 07/30/22 Pt demonstrates a moderate oropharyngeal dysphagia that is impacted by patient's current cognitive deficits. Patient's oral phase is characterized by poor awareness of bolus resulting in inconsistent timing of swallowing trigger. With solids, patient needed verbal cues (snapping and counting) to attend and initiate a swallow with all boluses contained in the velleculae. With liquids, patient consistently triggered his swallow  at the pyriform sinuses. This resulted in sensed aspiration of thin liquids via cup. Patient also with inconsistent amounts of pharyngeal residue that cleared with a 2nd subsequent swallow. No penetration or aspiration noted with nectar-thick liquids except for 1 episode of flash penetration. Provided thorough education to the patient's wife regarding patient's current swallowing function and that he is currently at a higher risk of aspiration due to cognitive deficits. She verbalized understanding and agreement with recommendations of continuing Dys. 3 textures but upgrading to nectar-thick liquids via cup with strict aspiration precautions and supervision. This will hopefully improve patient's intake of liquids with improved ability to stay hydrated without IV fluids prior to potential discharge on 08/03/22. SLP Diet Recommendations Dysphagia 3 (Mech soft) solids;Nectar thick liquid Liquid Administration via Cup;Spoon;No straw Medication Administration Crushed with puree Compensations: Slow rate;Small sips/bites;Minimize environmental distractions;Multiple dry swallows after each bite/sip  Today, pt ate dys III and drank nectar liqiuds. See "clinicial impressions" for results.  STANDARDIZED ASSESSMENTS: CLQT: may need to be completed in first 1-3 sessions  PATIENT REPORTED OUTCOME MEASURES (PROM): Cognitive function: Short Form: to be administered in first 1-3 sessions   TODAY'S TREATMENT:                                                                                                                                         DATE:  09/06/22: Pt was able to hold 7-8 minute conversation with SLP today in therapy, x3. Speech volume not as loud as Friday 09/03/22, and pt's processing appeared slower today in general than on Friday 09/03/22. Pt had busy weekend and made schedule for the day with [facility] assistance. 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 Pt to cont to follow schedule to encourage brain stimulating tasks at home.  CONSIDER ADMINISTRATION OF CLQT for obtaining a baseline of pt's cognitive linguistics.  09/03/22: Pt recalled 3 iof his questions and the answers from Dr. Naaman Plummer appointment spontaneously. SLP and wife provided the other two with min-mod A and total A. Pt recalled one answer from these two spontaneously. Pt held attention for 10 minutes in conversation about this appointment and about exercise.   08/31/22: Pt and wife deny cont'd overt s/sx aspiration with meals. Pt still does not take meds with water due to "They are bitter." Douglas Cox said pt has Dr. Naaman Plummer tomorrow so SLP had pt write down questions  he has for Dr. Naaman Plummer. He thought of 2 spontaneously (5 with mod-max cues), and req'd consistent extra time to think of questions, mod A consistently for legible writing (decr'd emergent awareness), mod A usually for writing question in a format he would recall tomorrow (decr'd anticipatory awareness). Douglas Cox remarked that often she is having to encourage pt to get up from the chair to do things during the day. SLP strongly suggested a schedule and provided example of this, and also reiterated that pt will have decr'd ability to initiate tasks at this time and may require wife's cues to do daily tasks. SLP provided example of SLP calling pt's name in waiting room and him not arising until wife arises. Pt showing some incr'd awareness asking SLP if he will be able to return to things he was doing pre-CVA. 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  1/25:24: Wife asked if pt was safe for thin liquids. SLP told pt/wife that if pt took single sips and was not exhibiting coughing he could have thin liquids. SLP to assess next session. In a quiet environment, pt completed written math task for 9 minutes prior to requiring 5 minute break,  evidenced by difficulty with/inability in adding simple figures. Pt impulsive about starting back to task again and req'd SLP cueing for longer break. After 6 minutes, pt said, "Get back to this again," and re-focused on written math task- processing speed improved for 3 more entries until pt had to leave. SLP educated/highlighted to pt and wife the importance of taking breaks when he feels "stuck".   08/24/22: speech/cognition: Pt demonstrated sustained attention for 7 minutes today for therapy task of writing down reasons for each med. He demonstrated attention adequate to recall 3/5 reasons 2 minutes later, when given extra time, and required mod A with other two. He continues to perform attention lengthening tasks at home. Wife has downloaded [employer] to her phone and has directed pt to complete. Pt rec'd cont'd rationale today for his meds - he "does not like taking pills." Pt inquired reason for CVA today in session and was told by wife and SLP. Today, pt's reasoning questionable with rationale to not take med/s with pureed but pt indicated he did not enjoy the smoothies wife was making for him with meds in them.  Swallowing: SLP discussed ways to assist pt in taking meds as they are currently being crushed and put into a smoothie for pt; "Do I have to have the smoothies" pt inquired. SLP suggested meds with applesauce, and yogurt, one pureed item at a time, and pt declined each time. SLP, pt, and wife eventually agreed pt may attempt one med with pureed item until next session.  08/19/22:SLP targeted pt's attention in session today. He made poached egg so SLP asked for steps for making poached egg. Pt req'd occasional mod cues back to task with SLP using written cues. Pt expressive language is slow due to decr'd linguistic processing skills/decr'd attention.  SLP asked what pt had done in last 24 hours and req'd usual max A other than "watched [place]." SLP told pt/wife that </= 2 or 2 1/2  hours of TV a day are ok, but pt needs to engage his brain. SLP drew graph of progress of someone who engages their brain in cognitive/life tasks and someone who watches TV first 3 months post-CVA. Douglas Cox thanked SLP for this graphic/visual cue. SLP strongly encouraged pt  to engage in tasks other than watching TV. SLP asked pt for names of 5 spices and pt demo'd decr'd sustained/selective attention as he wrote "peppercini" - aphasic response(?) but emergent awareness poor at this time so pt did not realize he had made error.   08/17/22: Swallowing: Discussion today on rationale for thickened liquids to reinforce to pt to remain on nectar thick liquids due to risk of aspiration. Educated on why wet voice was necessary to clear throat/cough and reswallow. SLP then worked with pt on a strong throat clear or cough and pt demonstrated x2/3 attempts. This did not carry over unless cued by SLP that pt had "wet" voice. Pt unsuccessfully attempted throat clear or cough without a demo by SLP. Wife stated she needs to demo that more at home instead of just verbally cue. SLP confirmed this.  Speech: Sustained attention noted for up to 90 seconds with route finding with visual support. Without visual support pt consistently with <60 seconds attention. Education on attention tasks to do at home, work in 15 minute increments (at least 6/day), signs that a break is necessary, and that OT and ST are to utilize neuroplasticity - SLP defined neuroplasticity for pt and wife.  08/11/22 (eval): SLP discussed deficits in attention and highlighted it as being pt's most significant deficit area. SLP educated wife re: how to cue pt for swallowing/meals. Assured Douglas Cox multiple cues during a meal is necessary at this time due to poor sustained/selective attention. "I have to tell him a lot," she stated. After pt told SLP he didn't know when his CVA was, SLP ahd him write the date on paper in front of him. Two minutes later pt req'd cue to look  at paper to answer same question. In subsequent trials pt looked at paper independently, 4 minutes later, 5 minutes later, and 8 minutes later. SLP shared with Douglas Cox that repetition may be beneficial for pt's memory, and encouraged her to cont to cue pt for small sips and double swallows.    PATIENT EDUCATION: Education details: see "today's treatment" Person educated: Patient and Spouse Education method: Explanation and Demonstration Education comprehension: verbalized understanding and needs further education   GOALS: Goals reviewed with patient? No  SHORT TERM GOALS: Target date: 09/10/22  Pt will follow aspiration precautions from FEES with occasional min A in 2 sessions Baseline: Goal status: deferred - pt not coughing with POs  2.  Jamaar will demo sustained attention to participate in 10 minute therapy task x3/session, in 3 sessions Baseline: 09/03/22 Goal status: Ongoing  3.  Pt will demo understanding of written cues in simple therapy tasks, with initial cue allowed, in 3 sessions Baseline: 08-19-22, 09-03-22 Goal status: Met  4.  Pt will demo selective attention for 10 minutes to a simple therapy task in a min-mod noisy environment Baseline:  Goal status: Ongoing  5.  Pt/wife will tell SLP 3 overt s/sx aspiration PNA with modified independence Baseline:  Goal status: Deferred - pt  no longer couhging with POs  6   Using compensations, pt will ask wife to give him his medications, at least once/day between 3 sessions Baseline:  Goal status: Ongoing   LONG TERM GOALS: Target date: 11/09/22  Pt will follow aspiration precautions from FEES with rare min A in 3 sessions Baseline:  Goal status: Ongoing  2.  Pt will follow aspiration precautions from FEES with modified independence in 2 sessions Baseline:  Goal status: Ongoing  3.  Pt will demo selective attention  for 20 minutes to a simple therapy task in a min-mod noisy environment Baseline:  Goal status: Ongoing  4.   Pt will demo emergent awareness in simple therapy tasks with verbal cue for error awareness in 3 sessions Baseline:  Goal status: Ongoing  5.  Pt will demo alternating attention for 20 minutes to complete simple-mod complex therapy tasks 100% accuracy in 3 sessions Baseline:  Goal status: Ongoing  6.  Using compensations, pt will recall to administer his own medications correctly, at least once/day between 3 sessions Baseline:  Goal status: Ongoing  7.  Wife's PROM (due to pt's attention deficit) score for pt will improve in last 1-2 sessions than the score taken in the first 1-2 sessions Baseline:  Goal status: Ongoing  ASSESSMENT:  CLINICAL IMPRESSION: Patient is a 72 y.o. male who was seen today for treatment of simple cognition (primarily attention) and for swallowing skills in light of CVA 07-17-22. Sustained attention noted for 10 minutes in conversation. Pt swallowing is improving as cognition improves.   OBJECTIVE IMPAIRMENTS: include attention, memory, awareness, executive functioning, receptive language, and dysphagia. These impairments are limiting patient from return to work, managing medications, managing appointments, managing finances, household responsibilities, ADLs/IADLs, effectively communicating at home and in community, and safety when swallowing. Factors affecting potential to achieve goals and functional outcome are ability to learn/carryover information, cooperation/participation level, and severity of impairments.. Patient will benefit from skilled SLP services to address above impairments and improve overall function.  REHAB POTENTIAL: Good  PLAN:  SLP FREQUENCY: 2x/week  SLP DURATION: 12 weeks  PLANNED INTERVENTIONS: Aspiration precaution training, Pharyngeal strengthening exercises, Diet toleration management , Environmental controls, Trials of upgraded texture/liquids, Cueing hierachy, Cognitive reorganization, Internal/external aids, Multimodal  communication approach, SLP instruction and feedback, Compensatory strategies, and Patient/family education    [facility], CCC-SLP 09/06/2022, 12:41 PM

## 2022-09-07 ENCOUNTER — Other Ambulatory Visit (HOSPITAL_COMMUNITY): Payer: Self-pay

## 2022-09-07 ENCOUNTER — Telehealth: Payer: Self-pay | Admitting: Internal Medicine

## 2022-09-07 ENCOUNTER — Telehealth: Payer: Self-pay | Admitting: *Deleted

## 2022-09-07 ENCOUNTER — Telehealth: Payer: Self-pay

## 2022-09-07 MED ORDER — ROSUVASTATIN CALCIUM 20 MG PO TABS
20.0000 mg | ORAL_TABLET | Freq: Every day | ORAL | 3 refills | Status: DC
  Filled 2022-09-07: qty 30, 30d supply, fill #0
  Filled 2022-10-03: qty 30, 30d supply, fill #1
  Filled 2022-10-26: qty 30, 30d supply, fill #2
  Filled 2022-11-09 – 2023-01-01 (×2): qty 30, 30d supply, fill #3

## 2022-09-07 NOTE — Telephone Encounter (Signed)
Rx sent 

## 2022-09-07 NOTE — Telephone Encounter (Signed)
Patient's wife contacted the office asking if patient should still undergo EGD that was recommended by Dr. Kipp Brood in 2022. Per Dr. Kipp Brood, wife advised that patient should still undergo EGD. VM left for wife stating such. Nothing further at this time.

## 2022-09-07 NOTE — Progress Notes (Addendum)
Received message to call patient in reference to problem with a groin site that was accessed for procedure 07/17/22. Contacted pt via phone. Pt states that the groin site is closed and it is not painful or bothering him. He was offered an appointment for tomorrow to assess the area. Pt refused stating he did not think it was necessary. He was advised to call radiology if he had other issues. Pt verbalized understanding.     Narda Rutherford, AGNP-BC 09/07/2022, 3:30 PM

## 2022-09-07 NOTE — Telephone Encounter (Signed)
Cyd Silence will need the Rx for Crestor sent to [facility]. Per Wife it was not received or given on DOS.   Thank you

## 2022-09-08 ENCOUNTER — Ambulatory Visit: Payer: Medicare HMO

## 2022-09-08 ENCOUNTER — Ambulatory Visit: Payer: Medicare HMO | Admitting: Occupational Therapy

## 2022-09-08 ENCOUNTER — Telehealth: Payer: Self-pay | Admitting: *Deleted

## 2022-09-08 DIAGNOSIS — I69318 Other symptoms and signs involving cognitive functions following cerebral infarction: Secondary | ICD-10-CM

## 2022-09-08 DIAGNOSIS — R41841 Cognitive communication deficit: Secondary | ICD-10-CM

## 2022-09-08 DIAGNOSIS — R41840 Attention and concentration deficit: Secondary | ICD-10-CM

## 2022-09-08 NOTE — Therapy (Signed)
OUTPATIENT SPEECH LANGUAGE PATHOLOGY TREATMENT   Patient Name: Douglas Cox MRN: [id number] DOB:1951/01/22, 72 y.o., male Today's Date: 09/08/2022  PCP: None Recorded REFERRING PROVIDER: Alysia Penna, MD  END OF SESSION:  End of Session - 09/08/22 1317     Visit Number 9    Number of Visits 25    Date for SLP Re-Evaluation 11/09/22    SLP Start Time 1233    SLP Stop Time  1315    SLP Time Calculation (min) 42 min    Activity Tolerance Patient tolerated treatment well                  Past Medical History:  Diagnosis Date   Celiac disease    Retinal detachment    Past Surgical History:  Procedure Laterality Date   CATARACT EXTRACTION Bilateral    Dr. Valetta Close   EYE SURGERY     FRACTURE SURGERY     GAS INSERTION Right 05/21/2021   Procedure: INSERTION OF GAS - C3F8;  Surgeon: Bernarda Caffey, MD;  Location: [place];  Service: Ophthalmology;  Laterality: Right;   GAS/FLUID EXCHANGE Right 05/21/2021   Procedure: GAS/FLUID EXCHANGE;  Surgeon: Bernarda Caffey, MD;  Location: [place];  Service: Ophthalmology;  Laterality: Right;   HERNIA REPAIR     IR CT HEAD LTD  07/17/2022   IR PERCUTANEOUS ART THROMBECTOMY/INFUSION INTRACRANIAL INC DIAG ANGIO  07/17/2022   IR US GUIDE VASC ACCESS RIGHT  07/17/2022   PARS PLANA VITRECTOMY Right 05/21/2021   Procedure: PARS PLANA VITRECTOMY WITH 25 GAUGE;  Surgeon: Bernarda Caffey, MD;  Location: [place];  Service: Ophthalmology;  Laterality: Right;   PERFLUORONE INJECTION Right 05/21/2021   Procedure: PERFLUORONE INJECTION;  Surgeon: Bernarda Caffey, MD;  Location: [place];  Service: Ophthalmology;  Laterality: Right;   PHOTOCOAGULATION WITH LASER Right 05/21/2021   Procedure: PHOTOCOAGULATION WITH LASER;  Surgeon: Bernarda Caffey, MD;  Location: [place];  Service: Ophthalmology;  Laterality: Right;   RADIOLOGY WITH ANESTHESIA N/A 07/17/2022   Procedure: IR WITH ANESTHESIA;  Surgeon: Radiologist, Medication, MD;  Location: [place];  Service:  Radiology;  Laterality: N/A;   RETINAL DETACHMENT SURGERY     Patient Active Problem List   Diagnosis Date Noted   Paroxysmal atrial fibrillation ([place]) 09/01/2022   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 ([place]) 07/22/2022   Protein-calorie malnutrition, severe 07/19/2022   Stroke (cerebrum) ([place]) 07/17/2022   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   Celiac disease    Alcohol dependence ([place])    Bradycardia     ONSET DATE: 07/17/22   REFERRING DIAG: I63.81 (ICD-10-CM) - 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  TNK given and recanalization acheived with thrombectomy.  Later at 1800 noted right sided weakness and new CT Head and CTA head and neck reordered and negative for reocclusion. MRI today shows bilateral basal ganglia infarcts. ASA 81mg  started 12/17  Pt with ETT for thrombectomy and overnight.  Extubated 12/17    PAIN:  Are you having pain? No   PATIENT GOALS: Pt did not answer question when asked, due to attention/processing deficits  OBJECTIVE:  DIAGNOSTIC FINDINGS:  FEES 07/30/22 Pt demonstrates a moderate oropharyngeal dysphagia that is impacted by patient's current cognitive deficits. Patient's oral phase is characterized by poor awareness of bolus resulting in inconsistent timing of swallowing trigger. With solids, patient needed verbal cues (snapping and counting) to attend and initiate a swallow with all boluses contained in the velleculae. With liquids, patient consistently triggered his swallow at the pyriform  sinuses. This resulted in sensed aspiration of thin liquids via cup. Patient also with inconsistent amounts of pharyngeal residue that cleared with a 2nd subsequent swallow. No penetration or aspiration noted with nectar-thick liquids except for 1 episode of flash penetration. Provided thorough education to the patient's wife regarding patient's current swallowing function and that he is currently at a higher risk of aspiration due to cognitive deficits. She verbalized understanding and agreement with recommendations of continuing Dys. 3 textures but upgrading to nectar-thick liquids via cup with strict aspiration precautions and supervision. This will hopefully improve patient's intake of liquids with improved ability to stay hydrated without IV fluids prior to potential discharge on 08/03/22. SLP Diet Recommendations Dysphagia 3 (Mech soft) solids;Nectar thick liquid Liquid Administration via Cup;Spoon;No straw Medication Administration Crushed with puree Compensations: Slow rate;Small sips/bites;Minimize environmental distractions;Multiple dry swallows after each bite/sip  Today, pt ate dys III and drank nectar liqiuds. See "clinicial impressions" for results.  STANDARDIZED ASSESSMENTS: CLQT: may need to be completed in first 1-3 sessions  PATIENT REPORTED OUTCOME MEASURES (PROM): Cognitive function: Short Form: to be administered in first 1-3 sessions   TODAY'S TREATMENT:                                                                                                                                         DATE:  09/08/22: Pt was doing bills this morning and called about a incr in their mortgage amount, independently. SLP asked pt to [facility] and pt able to do so (and noted SLP error) without 2/4 road names (aware of one error). Pt recalled 3/5 items from today's schedule prior to ST. He did not think or attempt to write down 3 medication names. He "bet" SLP he would  remember to bring his vitamin D next session. Held attention for 8 minutes prior to observation of loss of attention. At times pt stops verbal expression as if he might be experiencing anomia.  SLP to consider administering [employer] (BNT-2).   09/06/22: Pt was able to hold 7-8 minute conversation with SLP today in therapy, x3. Speech volume not as loud as Friday 09/03/22, and pt's processing appeared slower today in general than on Friday 09/03/22. Pt had busy weekend and made schedule for the day with [facility] assistance. 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 Pt to  cont to follow schedule to encourage brain stimulating tasks at home.  CONSIDER ADMINISTRATION OF CLQT for obtaining a baseline of pt's cognitive linguistics.  09/03/22: Pt recalled 3 iof his questions and the answers from Dr. Naaman Plummer appointment spontaneously. SLP and wife provided the other two with min-mod A and total A. Pt recalled one answer from these two spontaneously. Pt held attention for 10 minutes in conversation about this appointment and about exercise.   08/31/22: Pt and wife deny cont'd overt s/sx aspiration with meals. Pt still does not take meds with water due to "They are bitter." [name] said pt has Dr. Naaman Plummer tomorrow so SLP had pt write down questions he has for Dr. Naaman Plummer. He thought of 2 spontaneously (5 with mod-max cues), and req'd consistent extra time to think of questions, mod A consistently for legible writing (decr'd emergent awareness), mod A usually for writing question in a format he would recall tomorrow (decr'd anticipatory awareness). [name] remarked that often she is having to encourage pt to get up from the chair to do things during the day. SLP strongly suggested a schedule and provided example of this, and also reiterated that pt will have decr'd ability to initiate tasks at this time and may require  wife's cues to do daily tasks. SLP provided example of SLP calling pt's name in waiting room and him not arising until wife arises. Pt showing some incr'd awareness asking SLP if he will be able to return to things he was doing pre-CVA. 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  1/25:24: Wife asked if pt was safe for thin liquids. SLP told pt/wife that if pt took single sips and was not exhibiting coughing he could have thin liquids. SLP to assess next session. In a quiet environment, pt completed written math task for 9 minutes prior to requiring 5 minute break, evidenced by difficulty with/inability in adding simple figures. Pt impulsive about starting back to task again and req'd SLP cueing for longer break. After 6 minutes, pt said, "Get back to this again," and re-focused on written math task- processing speed improved for 3 more entries until pt had to leave. SLP educated/highlighted to pt and wife the importance of taking breaks when he feels "stuck".   08/24/22: speech/cognition: Pt demonstrated sustained attention for 7 minutes today for therapy task of writing down reasons for each med. He demonstrated attention adequate to recall 3/5 reasons 2 minutes later, when given extra time, and required mod A with other two. He continues to perform attention lengthening tasks at home. Wife has downloaded [employer] to her phone and has directed pt to complete. Pt rec'd cont'd rationale today for his meds - he "does not like taking pills." Pt inquired reason for CVA today in session and was told by wife and SLP. Today, pt's reasoning questionable with rationale to not take med/s with pureed but pt indicated he did not enjoy the smoothies wife was making for him with meds in them.  Swallowing: SLP discussed ways to assist pt in taking meds as they are currently being crushed and put into a smoothie for pt; "Do I have to have the smoothies" pt inquired.  SLP suggested meds with applesauce, and yogurt, one pureed item at a time, and pt declined each time. SLP, pt, and wife eventually agreed pt may attempt one med with pureed item until next session.   PATIENT EDUCATION: Education details: see "today's treatment"  Person educated: Patient and Spouse Education method: Customer service manager Education comprehension: verbalized understanding and needs further education   GOALS: Goals reviewed with patient? No  SHORT TERM GOALS: Target date: 09/10/22  Pt will follow aspiration precautions from FEES with occasional min A in 2 sessions Baseline: Goal status: deferred - pt not coughing with POs  2.  Mackey will demo sustained attention to participate in 10 minute therapy task x3/session, in 3 sessions Baseline: 09/03/22 Goal status: Ongoing  3.  Pt will demo understanding of written cues in simple therapy tasks, with initial cue allowed, in 3 sessions Baseline: 08-19-22, 09-03-22 Goal status: Met  4.  Pt will demo selective attention for 10 minutes to a simple therapy task in a min-mod noisy environment Baseline:  Goal status: Ongoing  5.  Pt/wife will tell SLP 3 overt s/sx aspiration PNA with modified independence Baseline:  Goal status: Deferred - pt  no longer couhging with POs  6   Using compensations, pt will ask wife to give him his medications, at least once/day between 3 sessions Baseline:  Goal status: Ongoing   LONG TERM GOALS: Target date: 11/09/22  Pt will follow aspiration precautions from FEES with rare min A in 3 sessions Baseline:  Goal status: Ongoing  2.  Pt will follow aspiration precautions from FEES with modified independence in 2 sessions Baseline:  Goal status: Ongoing  3.  Pt will demo selective attention for 20 minutes to a simple therapy task in a min-mod noisy environment Baseline:  Goal status: Ongoing  4.  Pt will demo emergent awareness in simple therapy tasks with verbal cue for error awareness  in 3 sessions Baseline:  Goal status: Ongoing  5.  Pt will demo alternating attention for 20 minutes to complete simple-mod complex therapy tasks 100% accuracy in 3 sessions Baseline:  Goal status: Ongoing  6.  Using compensations, pt will recall to administer his own medications correctly, at least once/day between 3 sessions Baseline:  Goal status: Ongoing  7.  Wife's PROM (due to pt's attention deficit) score for pt will improve in last 1-2 sessions than the score taken in the first 1-2 sessions Baseline:  Goal status: Ongoing  ASSESSMENT:  CLINICAL IMPRESSION: Patient is a 72 y.o. male who was seen today for treatment of simple cognition (primarily attention) and for swallowing skills in light of CVA 07-17-22. Sustained attention noted for 10 minutes in conversation. Pt swallowing is improving as cognition improves.   OBJECTIVE IMPAIRMENTS: include attention, memory, awareness, executive functioning, receptive language, and dysphagia. These impairments are limiting patient from return to work, managing medications, managing appointments, managing finances, household responsibilities, ADLs/IADLs, effectively communicating at home and in community, and safety when swallowing. Factors affecting potential to achieve goals and functional outcome are ability to learn/carryover information, cooperation/participation level, and severity of impairments.. Patient will benefit from skilled SLP services to address above impairments and improve overall function.  REHAB POTENTIAL: Good  PLAN:  SLP FREQUENCY: 2x/week  SLP DURATION: 12 weeks  PLANNED INTERVENTIONS: Aspiration precaution training, Pharyngeal strengthening exercises, Diet toleration management , Environmental controls, Trials of upgraded texture/liquids, Cueing hierachy, Cognitive reorganization, Internal/external aids, Multimodal communication approach, SLP instruction and feedback, Compensatory strategies, and Patient/family  education    [facility], [place] 09/08/2022, 1:17 PM

## 2022-09-08 NOTE — Telephone Encounter (Signed)
Patient with diagnosis of atrial fibrillation on Eliquis for anticoagulation.    Procedure: single implant placement (dental) Date of procedure: TBD   CHA2DS2-VASc Score = 4   This indicates a 4.8% annual risk of stroke. The patient's score is based upon: CHF History: 0 HTN History: 1 Diabetes History: 0 Stroke History: 2 Vascular Disease History: 0 Age Score: 1 Gender Score: 0    CrCl 50 Platelet count 326  Patient does not require pre-op antibiotics for dental procedure.  Patient has not yet been seen in office since stroke and AF diagnosis.  Will see Dr. Quentin Ore on 09/10/22.  Would not recommend holding Eliquis, as has been < 2 months since stroke occurred, but will defer to Dr. Quentin Ore decision when he sees patient on Friday.    **This guidance is not considered finalized until pre-operative APP has relayed final recommendations.**

## 2022-09-08 NOTE — Telephone Encounter (Signed)
Pt has appt 09/10/22 with Dr. Quentin Ore. I will add need pre op clearance to appt notes.

## 2022-09-08 NOTE — Telephone Encounter (Signed)
   Name: Douglas Cox  DOB: 17-Oct-1950  MRN: [id number]  Primary Cardiologist: None  Chart reviewed as part of pre-operative protocol coverage. Because of Lonza Huffine's past medical history and time since last visit, he will require a follow-up in-office visit in order to better assess preoperative cardiovascular risk.  Pre-op covering staff: - Please schedule appointment and call patient to inform them. If patient already had an upcoming appointment within acceptable timeframe, please add "pre-op clearance" to the appointment notes so provider is aware. - Please contact requesting surgeon's office via preferred method (i.e, phone, fax) to inform them of need for appointment prior to surgery.  This message will also be routed to pharmacy pool for input on holding Eliquis as requested below so that this information is available to the clearing provider at time of patient's appointment.   Lenna Sciara, NP  09/08/2022, 12:09 PM

## 2022-09-08 NOTE — Telephone Encounter (Signed)
   Pre-operative Risk Assessment    Patient Name: Douglas Cox  DOB: July 30, 1951 MRN: [id number]     Request for Surgical Clearance    Procedure:   Single Implant placement  Date of Surgery:  Clearance TBD                                 Surgeon:  Perlie Gold DDS Surgeon's Group or Practice Name:  [facility] Phone number:  [phone] Fax number:  [phone]   Type of Clearance Requested:   - Medical  - Pharmacy:  Hold Apixaban (Eliquis) Not Indicated.   Type of Anesthesia:   Moderate and Procedural sedation   Additional requests/questions:    Signed, Greer Ee   09/08/2022, 6:54 AM

## 2022-09-08 NOTE — Therapy (Signed)
OUTPATIENT OCCUPATIONAL THERAPY  Treatment Note  Patient Name: Douglas Cox MRN: [id number] DOB:[birth year], 72 y.o., male Today's Date: 09/08/2022  PCP: Dr. Zara Council ([facility]) REFERRING PROVIDER: Charlett Blake, MD  END OF SESSION:  OT End of Session - 09/08/22 1320     Visit Number 6    Number of Visits 15    Date for OT Re-Evaluation 10/08/22    Authorization Type [employer]    OT Start Time 1751    OT Stop Time 1400    OT Time Calculation (min) 43 min    Activity Tolerance Patient tolerated treatment well    Behavior During Therapy Flat affect;Restless                  Past Medical History:  Diagnosis Date   Celiac disease    Retinal detachment    Past Surgical History:  Procedure Laterality Date   CATARACT EXTRACTION Bilateral    Dr. Valetta Close   EYE SURGERY     FRACTURE SURGERY     GAS INSERTION Right 05/21/2021   Procedure: INSERTION OF GAS - C3F8;  Surgeon: Bernarda Caffey, MD;  Location: [place];  Service: Ophthalmology;  Laterality: Right;   GAS/FLUID EXCHANGE Right 05/21/2021   Procedure: GAS/FLUID EXCHANGE;  Surgeon: Bernarda Caffey, MD;  Location: [place];  Service: Ophthalmology;  Laterality: Right;   HERNIA REPAIR     IR CT HEAD LTD  07/17/2022   IR PERCUTANEOUS ART THROMBECTOMY/INFUSION INTRACRANIAL INC DIAG ANGIO  07/17/2022   IR US GUIDE VASC ACCESS RIGHT  07/17/2022   PARS PLANA VITRECTOMY Right 05/21/2021   Procedure: PARS PLANA VITRECTOMY WITH 25 GAUGE;  Surgeon: Bernarda Caffey, MD;  Location: [place];  Service: Ophthalmology;  Laterality: Right;   PERFLUORONE INJECTION Right 05/21/2021   Procedure: PERFLUORONE INJECTION;  Surgeon: Bernarda Caffey, MD;  Location: [place];  Service: Ophthalmology;  Laterality: Right;   PHOTOCOAGULATION WITH LASER Right 05/21/2021   Procedure: PHOTOCOAGULATION WITH LASER;  Surgeon: Bernarda Caffey, MD;  Location: [place];  Service: Ophthalmology;  Laterality: Right;   RADIOLOGY WITH ANESTHESIA N/A 07/17/2022    Procedure: IR WITH ANESTHESIA;  Surgeon: Radiologist, Medication, MD;  Location: [place];  Service: Radiology;  Laterality: N/A;   RETINAL DETACHMENT SURGERY     Patient Active Problem List   Diagnosis Date Noted   Paroxysmal atrial fibrillation ([place]) 09/01/2022   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 ([place]) 07/22/2022   Protein-calorie malnutrition, severe 07/19/2022   Stroke (cerebrum) ([place]) 07/17/2022   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   Celiac disease    Alcohol dependence ([place])    Bradycardia     ONSET DATE: 08/04/22 (referral date)  REFERRING DIAG: I63.81 (ICD-10-CM) - 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 Pt accompanied by: self and significant other (spouse)  PERTINENT HISTORY: 72 y.o. male with a PMHx of cataracts, celiac disease and retinal detachment who presents to the ED with acute onset of left hemiplegia, right gaze deviation and left hemineglect.MRI: Acute/subacute nonhemorrhagic infarct of the right caudate head and lentiform nucleus; acute/subacute nonhemorrhagic infarct of the left caudate head and lentiform nucleus spanning the internal capsule; Acute/subacute nonhemorrhagic infarct of the cortex of the right temporal tip.  PRECAUTIONS: Fall  WEIGHT BEARING RESTRICTIONS:  No  PAIN:  Are you having pain? No  FALLS: Has patient fallen in last 6 months? Yes. Number of falls 2 - 1 when he had the stroke he fell forward and hit his head and fell off a ladder  LIVING ENVIRONMENT: Lives with: lives with their spouse Lives in: House/apartment Stairs: Yes: Internal: 16 steps; on right going up and External: 4 steps to front door, 16 steps from garage  to main floor of home steps; on right going up Has following equipment at home: Single point cane  PLOF: Independent, working as an Radio producer  PATIENT GOALS: to improve thought process, speech  OBJECTIVE:   HAND DOMINANCE: Right  ADLs: Overall ADLs: Independent with all bathing/dressing.    IADLs: Shopping: Not interested in going to the store, but would have gone prior to CVA Light housekeeping: is able to assist with household tasks when spouse asks for assistance or directs him to complete Meal Prep: Spouse is proving supervision for meal prep, but has been able to cook fish Community mobility: MD has not cleared pt to drive Medication management: Spouse administers medication, is having difficulty with administering B12 shot Financial management: Spouse is currently paying bills, but pt would have completed prior to CVA   COORDINATION: Finger Nose Finger test: mild ataxia, dysmetria LUE 9 Hole Peg test: Right: 32.37 sec; Left: 50.04 sec  Noted some difficulty with sequencing, requiring cues for recall and sequencing of directions Box and Blocks:  Right 38 blocks, Left 40 blocks (Pt frequently picking up 2-3 blocks at a time despite mod-max cues to pick up only one block at a time.    COGNITION: Overall cognitive status: Impaired Orientation Level: Person;Place;Situation Person: Oriented Place: Disoriented Situation: Disoriented Memory: Impaired Memory Impairment: Storage deficit;Retrieval deficit;Decreased recall of new information;Decreased short term memory Attention: Focused Awareness: Impaired Awareness Impairment: Intellectual impairment Problem Solving: Impaired Problem Solving Impairment: Verbal basic;Functional basic Behaviors: Restless;Impulsive Safety/Judgment: Impaired  OBSERVATIONS: Pt demonstrating attention at focused level during 9 hole peg test, requiring frequent cues and redirection to attend to 2 step task with Box and Blocks.  Pt demonstrating decreased  interest and motivation during OT evaluation, however pt and spouse agreeable to continue with OT at this time to focus on memory and return to engagement in IADLs and leisure pursuits.  TODAY'S TREATMENT:                                  09/08/22 Functional mobility/alternating attention: engaged in ball toss with BUE while sidestepping for balance challenge. Pt demonstrating good sequencing with sidestepping with only ball toss.  OT incorporated cognitive challenge with naming animals in alphabetical order to challenge cognition with sequencing and alternating attention. Pt with increased difficulty with alternating attention, frequently stopping to think and then toss the ball requiring cues to resume sidestepping.  Pt also with decreased fluidity and body alignment with tossing ball with increased cognitive challenge. Downgraded to walking forward while tossing ball to self and counting forwards and backwards.  Pt with increased difficulty with counting backwards.   Memory/alternating attention: simulated ordering from menu with focus on recall and mental math to not overspend.  Pt with decreased mental math, required writing down items to complete math.  Pt overspending by 50 cents. Handwriting: with significant micrographia and deceased legibility when writing down quickly.  OT encouraged pt to slow down, adhere to lines to increase size, and attempt printing to allow  for start and stop with each letter.    09/06/22 Pill box assessment: Completed in 5:55 with ability to correct one error with prescription of "every other day" without cues.  Pt also choosing to place "twice a day with breakfast and dinner" in morn and bed slot, completing bed slot after 5 min time limit expired.  OT engaged in discussion with pt and spouse on specific prescription and pt's rationale for placing in bed slot vs evening/dinner slot.  Discussed possible implications of changing times on prescribed medications.  Encouraged pt  and wife to complete meds together to increase pt participation and "ownership" of medication management.   Organization/handwriting: engaged in complex planning/problem solving prompt requiring pt to organize items into systematic schedule to complete all in time allotted.  Discussed prioritizing activities to ensure completion of tasks.  Pt requiring mod assist to setup task with OT listing all tasks with pt then able to write order of stops on list with times noted.  Pt demonstrating legible handwriting, however still with intermittent micrographia.  Pt benefiting from mod cues for organization and prioritization of tasks based on complexity and importance.  OT encouraged pt to prioritize daily tasks to increase initiation and participation in more complex sequencing.    08/31/22 Handwriting: focused and sustained attention with ability to maintain attention to writing prompt.  Pt demonstrating ability to attend to task for 1 minute, even in min distracting environment of treatment space.  However pt demonstrating increased micrographia as task continued.  OT educated on use of lined paper with focus on increasing letter size to utilize entire space.  Pt re-writing response to prompt with improved size and legibility, still getting smaller as he continued writing.  OT challenged memory and spontaneous answers with writing prompts to increase recall. Engaged in small peg board pattern replication with use of key to identify correlating colors, challenging sustained and alternating attention.  Pt demonstrating sustained attention for 4 mins without additional cues.  Pt making 4/24 errors with ability to correct first two before losing focus to complete.   Pt demonstrating good sequencing and organized scanning pattern.  Educated on functional carryover of task progressing towards alternating attention.    PATIENT EDUCATION: Education details: Educated on Estate agent during daily  tasks Person educated: Patient and Spouse Education method: Explanation Education comprehension: verbalized understanding and needs further education  HOME EXERCISE PROGRAM: TBD   GOALS: Goals reviewed with patient? Yes  SHORT TERM GOALS: Target date: 09/03/22  Pt will be able to identify/verbalize steps and/or items needed to complete simple functional task (simple snack prep, laundry task, etc) with min cues. Baseline: Goal status: IN PROGRESS  2.  Patient will be able to maintain selective attention for 2 mins during structured task without additional cues for attention.  Baseline:  Goal status: MET - 08/31/22  3.  Pt will complete table top scanning activity with min cues for technique Baseline:  Goal status: MET - 08/31/22  5.  Pt will be able to utilize phone to locate contacts and make emergency calls as needed without cues. Baseline:  Goal status: IN PROGRESS   LONG TERM GOALS: Target date: 09/24/22  Pt will demonstrate ability to sequence simple functional task (simple snack prep, laundry task, etc) at Mod I level. Baseline:  Goal status: IN PROGRESS  2.  Pt will navigate a moderately busy environment, following multi-step commands with 90% accuracy to simulate return to community reintegration. Baseline:  Goal status: IN PROGRESS  3.  Pt  will be able to utilize phone to look up phone number for physician and/or other frequently used number/location without cues. Baseline:  Goal status: IN PROGRESS  4.  Pt will be able to verbalize wayfinding/directions from home to familiar locations in the area without cues. Baseline:  Goal status: IN PROGRESS  5.  Pt will complete pill box assessment with <5 errors in 5 min time limit to demonstrate increased attention to task. Baseline:  Goal status: IN PROGRESS  6.  Pt will complete MOCA and demonstrate improved cognitive function on MOCA by 3 points. Baseline: TBD Goal status: IN PROGRESS  ASSESSMENT:  CLINICAL  IMPRESSION: Pt with increased difficulty with alternating attention with motor and cognitive component.  Pt with decreased ability to maintain motor movements with addition of cognitive challenge.  Pt demonstrating awareness about some aspects impacting quality of movement from handwriting to gross motor with sidestepping.  Pt demonstrating impulsivity with menu ordering task, especially with handwriting and attempts at mental math.   PERFORMANCE DEFICITS: in functional skills including IADLs, coordination, Fine motor control, Gross motor control, body mechanics, endurance, decreased knowledge of precautions, decreased knowledge of use of DME, and UE functional use, cognitive skills including attention, energy/drive, memory, orientation, problem solving, safety awareness, sequencing, and temperament/personality, and psychosocial skills including coping strategies, habits, and routines and behaviors.   IMPAIRMENTS: are limiting patient from IADLs and leisure.   CO-MORBIDITIES: may have co-morbidities  that affects occupational performance. Patient will benefit from skilled OT to address above impairments and improve overall function.  MODIFICATION OR ASSISTANCE TO COMPLETE EVALUATION: Min-Moderate modification of tasks or assist with assess necessary to complete an evaluation.  OT OCCUPATIONAL PROFILE AND HISTORY: Detailed assessment: Review of records and additional review of physical, cognitive, psychosocial history related to current functional performance.  CLINICAL DECISION MAKING: Moderate - several treatment options, min-mod task modification necessary  REHAB POTENTIAL: Good  EVALUATION COMPLEXITY: Moderate    PLAN:  OT FREQUENCY: 1-2x/week  OT DURATION: 6 weeks  PLANNED INTERVENTIONS: self care/ADL training, therapeutic exercise, therapeutic activity, balance training, functional mobility training, patient/family education, cognitive remediation/compensation, psychosocial skills  training, energy conservation, coping strategies training, and DME and/or AE instructions  RECOMMENDED OTHER SERVICES: NA  CONSULTED AND AGREED WITH PLAN OF CARE: Patient and family member/caregiver  PLAN FOR NEXT SESSION: focused and selective attention - functional tasks with providing directions, making phone calls, preparing simple meal.  Complex/simple organization/planning   Kyelle Urbas, OTR/L 09/08/2022, 1:20 PM

## 2022-09-08 NOTE — Telephone Encounter (Signed)
Wife informed

## 2022-09-10 ENCOUNTER — Encounter: Payer: Self-pay | Admitting: Cardiology

## 2022-09-10 ENCOUNTER — Ambulatory Visit: Payer: Medicare HMO | Attending: Cardiology | Admitting: Cardiology

## 2022-09-10 VITALS — BP 122/68 | HR 46 | Ht 70.0 in | Wt 145.0 lb

## 2022-09-10 DIAGNOSIS — I639 Cerebral infarction, unspecified: Secondary | ICD-10-CM | POA: Diagnosis not present

## 2022-09-10 NOTE — Progress Notes (Signed)
Electrophysiology Office Follow up Visit Note:    Date:  09/10/2022   ID:  Douglas Cox, DOB February 18, 1951, MRN [id number]  PCP:  Berneta Levins, DO  [facility] Cardiologist:  None  [facility] Electrophysiologist:  Vickie Epley, MD    Interval History:    Douglas Cox is a 72 y.o. male who presents for a follow up visit.   I met the patient July 22, 2022 when he was hospitalized with cryptogenic stroke.  During my visit with the patient he was obtunded and did not participate in the history at all.  It seemed to wax and wane throughout the day.  For this reason I did not think he was a good candidate for loop recorder during the hospitalization and plan for outpatient follow-up with possible loop implant.    He wore a heart monitor after discharge which diagnosed atrial fibrillation.  There is a 3% burden.  He was started on anticoagulation.  Today, he is accompanied by a family member. He has still been feeling unwell and feels as though he struggles to find his words.   His wife reports that he has been sleeping about 12 hours a night and has a nap during the day. He complains of back pain, which his wife attributes to him lying down most of the day. She also reports that he has been going to rehab therapy. He is compliant with eliquis.   His wife reports a past episode of A-fib > 10 years ago. He occasionally feels palpitations.    He experiences an occasional discomfort from his stitches which he describes as something poking him.   He has been staying active by going to the [facility]. He has been eating a healthy diet. He enjoys eating eggs often. His wife stated is also a mushroom forager.   He is planning to have a dental implant soon in March. 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   He denies any chest pain, shortness of breath, or peripheral edema. No lightheadedness, headaches, syncope,  orthopnea, or PND.  Past Medical History:  Diagnosis Date   Celiac disease    Retinal detachment     Past Surgical History:  Procedure Laterality Date   CATARACT EXTRACTION Bilateral    Dr. Valetta Close   EYE SURGERY     FRACTURE SURGERY     GAS INSERTION Right 05/21/2021   Procedure: INSERTION OF GAS - C3F8;  Surgeon: Bernarda Caffey, MD;  Location: [place];  Service: Ophthalmology;  Laterality: Right;   GAS/FLUID EXCHANGE Right 05/21/2021   Procedure: GAS/FLUID EXCHANGE;  Surgeon: Bernarda Caffey, MD;  Location: [place];  Service: Ophthalmology;  Laterality: Right;   HERNIA REPAIR     IR CT HEAD LTD  07/17/2022   IR PERCUTANEOUS ART THROMBECTOMY/INFUSION INTRACRANIAL INC DIAG ANGIO  07/17/2022   IR US GUIDE VASC ACCESS RIGHT  07/17/2022   PARS PLANA VITRECTOMY Right 05/21/2021   Procedure: PARS PLANA VITRECTOMY WITH 25 GAUGE;  Surgeon: Bernarda Caffey, MD;  Location: [place];  Service: Ophthalmology;  Laterality: Right;   PERFLUORONE INJECTION Right 05/21/2021   Procedure: PERFLUORONE INJECTION;  Surgeon: Bernarda Caffey, MD;  Location: [place];  Service: Ophthalmology;  Laterality: Right;   PHOTOCOAGULATION WITH LASER Right 05/21/2021   Procedure: PHOTOCOAGULATION WITH LASER;  Surgeon: Bernarda Caffey, MD;  Location: [place];  Service: Ophthalmology;  Laterality: Right;   RADIOLOGY WITH ANESTHESIA N/A 07/17/2022  Procedure: IR WITH ANESTHESIA;  Surgeon: Radiologist, Medication, MD;  Location: [place];  Service: Radiology;  Laterality: N/A;   RETINAL DETACHMENT SURGERY      Current Medications: Current Meds  Medication Sig   apixaban (ELIQUIS) 5 MG TABS tablet Take 1 tablet (5 mg total) by mouth 2 (two) times daily.   Cyanocobalamin (VITAMIN B-12 IJ) injection monthly   famotidine (PEPCID) 20 MG tablet Take 1 tablet (20 mg total) by mouth 2 (two) times daily.   magnesium oxide (MAG-OX) 400 MG tablet Take 1/2 tablet (200 mg total) by mouth at bedtime.   Probiotic Product (ALIGN) 4 MG CAPS     rosuvastatin (CRESTOR) 20 MG tablet Take 1 tablet (20 mg total) by mouth daily.   vitamin D3 (CHOLECALCIFEROL) 25 MCG tablet Take 1 tablet (1,000 Units total) by mouth daily.   [DISCONTINUED] Cholecalciferol 50 MCG (2000 UT) CAPS      Allergies:   Nitrous oxide, Codone [hydrocodone], Tylenol [acetaminophen], Hydrocodone-acetaminophen, Oxycodone hcl, Clarithromycin, Gluten meal, and Lactose intolerance (gi)   Social History   Socioeconomic History   Marital status: Married    Spouse name: Not on file   Number of children: Not on file   Years of education: Not on file   Highest education level: Not on file  Occupational History   Not on file  Tobacco Use   Smoking status: Former    Types: Cigarettes    Quit date: 01/30/2006    Years since quitting: 16.6   Smokeless tobacco: Never  Vaping Use   Vaping Use: Never used  Substance and Sexual Activity   Alcohol use: Not Currently    Alcohol/week: 4.0 - 5.0 standard drinks of alcohol    Types: 4 - 5 Shots of liquor per week    Comment: 3 glasses of whiskey per night.   Drug use: Not Currently    Frequency: 7.0 times per week    Types: Marijuana   Sexual activity: Not Currently  Other Topics Concern   Not on file  Social History Narrative   Not on file   Social Determinants of Health   Financial Resource Strain: Not on file  Food Insecurity: No Food Insecurity (07/21/2022)   Hunger Vital Sign    Worried About Running Out of Food in the Last Year: Never true    Ran Out of Food in the Last Year: Never true  Transportation Needs: No Transportation Needs (07/21/2022)   PRAPARE - Hydrologist (Medical): No    Lack of Transportation (Non-Medical): No  Physical Activity: Not on file  Stress: Not on file  Social Connections: Not on file     Family History: The patient's family history is not on file.  ROS:   Please see the history of present illness.   (+) Struggling to find words (+) Back pain   (+) Palpitations (occasional)  All other systems reviewed and are negative.  EKGs/Labs/Other Studies Reviewed:    The following studies were reviewed today:   Recent Labs: 07/19/2022: Magnesium 1.9 07/23/2022: ALT 17; Hemoglobin 14.0; Platelets 326 08/01/2022: BUN 21; Creatinine, Ser 1.07; Potassium 3.6; Sodium 140  Recent Lipid Panel    Component Value Date/Time   CHOL 176 07/18/2022 0335   TRIG 241 (H) 07/18/2022 0335   TRIG 246 (H) 07/18/2022 0335   HDL 44 07/18/2022 0335   CHOLHDL 4.0 07/18/2022 0335   VLDL 48 (H) 07/18/2022 0335   LDLCALC 84 07/18/2022 0335    Physical  Exam:    VS:  BP 122/68   Pulse (!) 46   Ht 5' 10"$  (1.778 m)   Wt 145 lb (65.8 kg)   SpO2 96%   BMI 20.81 kg/m     Wt Readings from Last 3 Encounters:  09/10/22 145 lb (65.8 kg)  09/01/22 148 lb (67.1 kg)  07/21/22 157 lb 3 oz (71.3 kg)     GEN:  Well nourished, well developed in no acute distress CARDIAC: RRR, no murmurs, rubs, gallops RESPIRATORY:  Clear to auscultation without rales, wheezing or rhonchi        ASSESSMENT:    1. Cerebrovascular accident (CVA), unspecified mechanism ([place])    PLAN:    In order of problems listed above:   #Paroxysmal atrial fibrillation Low burden.  Suspect this was the culprit for his recent stroke.  I would not recommend elective procedures at this time that would require holding his anticoagulation.  Would wait at least 3 months and then minimize interruptions if at all possible.  We discussed the increased risk of recurrent stroke while off anticoagulation for elective procedures during today's visit. Continue Eliquis  #Pre Op Clearance After 3 months, I think it is reasonable for him to undergo a surgical procedure from a CV risk perspective. If holding anticoagulation is a requirement to undergo a planned procedure, recommend minimizing the interruption and consideration of bridging given his history of cardioembolic stroke.  Follow-up with  APP in 6 months.   Medication Adjustments/Labs and Tests Ordered: Current medicines are reviewed at length with the patient today.  Concerns regarding medicines are outlined above.  No orders of the defined types were placed in this encounter.  No orders of the defined types were placed in this encounter.  I,Rachel Rivera,acting as a scribe for Vickie Epley, MD.,have documented all relevant documentation on the behalf of Vickie Epley, MD,as directed by  Vickie Epley, MD while in the presence of Vickie Epley, MD.  I, Vickie Epley, MD, have reviewed all documentation for this visit. The documentation on 09/10/22 for the exam, diagnosis, procedures, and orders are all accurate and complete.  Signed, Lars Mage, MD, [facility], [facility] 09/10/2022 12:59 PM    Electrophysiology [facility]

## 2022-09-10 NOTE — Patient Instructions (Addendum)
Medication Instructions:  Your physician recommends that you continue on your current medications as directed. Please refer to the Current Medication list given to you today.  *If you need a refill on your cardiac medications before your next appointment, please call your pharmacy*  Follow-Up: At [facility], you and your health needs are our priority.  As part of our continuing mission to provide you with exceptional heart care, we have created designated Provider Care Teams.  These Care Teams include your primary Cardiologist (physician) and Advanced Practice Providers (APPs -  Physician Assistants and Nurse Practitioners) who all work together to provide you with the care you need, when you need it.  Your next appointment:   6 months  Provider:   Tommye Standard, PA-C Legrand Como [address] [place], [place], NP

## 2022-09-10 NOTE — Progress Notes (Deleted)
Electrophysiology Office Follow up Visit Note:    Date:  09/10/2022   ID:  Douglas Cox, DOB Nov 12, 1950, MRN [id number]  PCP:  Berneta Levins, DO  [facility] Cardiologist:  None  [facility] Electrophysiologist:  Vickie Epley, MD    Interval History:    Douglas Cox is a 72 y.o. male who presents for a follow up visit.   I met the patient July 22, 2022 when he was hospitalized with cryptogenic stroke.  During my visit with the patient he was obtunded and did not participate in the history at all.  It seemed to wax and wane throughout the day.  For this reason I did not think he was a good candidate for loop recorder during the hospitalization and plan for outpatient follow-up with possible loop implant.    He wore a heart monitor after discharge which diagnosed atrial fibrillation.  There is a 3% burden.  He was started on anticoagulation.       Past Medical History:  Diagnosis Date   Celiac disease    Retinal detachment     Past Surgical History:  Procedure Laterality Date   CATARACT EXTRACTION Bilateral    Dr. Valetta Close   EYE SURGERY     FRACTURE SURGERY     GAS INSERTION Right 05/21/2021   Procedure: INSERTION OF GAS - C3F8;  Surgeon: Bernarda Caffey, MD;  Location: [place];  Service: Ophthalmology;  Laterality: Right;   GAS/FLUID EXCHANGE Right 05/21/2021   Procedure: GAS/FLUID EXCHANGE;  Surgeon: Bernarda Caffey, MD;  Location: [place];  Service: Ophthalmology;  Laterality: Right;   HERNIA REPAIR     IR CT HEAD LTD  07/17/2022   IR PERCUTANEOUS ART THROMBECTOMY/INFUSION INTRACRANIAL INC DIAG ANGIO  07/17/2022   IR US GUIDE VASC ACCESS RIGHT  07/17/2022   PARS PLANA VITRECTOMY Right 05/21/2021   Procedure: PARS PLANA VITRECTOMY WITH 25 GAUGE;  Surgeon: Bernarda Caffey, MD;  Location: [place];  Service: Ophthalmology;  Laterality: Right;   PERFLUORONE INJECTION Right 05/21/2021   Procedure: PERFLUORONE INJECTION;  Surgeon: Bernarda Caffey, MD;  Location:  [place];  Service: Ophthalmology;  Laterality: Right;   PHOTOCOAGULATION WITH LASER Right 05/21/2021   Procedure: PHOTOCOAGULATION WITH LASER;  Surgeon: Bernarda Caffey, MD;  Location: [place];  Service: Ophthalmology;  Laterality: Right;   RADIOLOGY WITH ANESTHESIA N/A 07/17/2022   Procedure: IR WITH ANESTHESIA;  Surgeon: Radiologist, Medication, MD;  Location: [place];  Service: Radiology;  Laterality: N/A;   RETINAL DETACHMENT SURGERY      Current Medications: No outpatient medications have been marked as taking for the 09/10/22 encounter (Appointment) with Vickie Epley, MD.     Allergies:   Nitrous oxide, Codone [hydrocodone], Tylenol [acetaminophen], Hydrocodone-acetaminophen, Oxycodone hcl, Clarithromycin, Gluten meal, and Lactose intolerance (gi)   Social History   Socioeconomic History   Marital status: Married    Spouse name: Not on file   Number of children: Not on file   Years of education: Not on file   Highest education level: Not on file  Occupational History   Not on file  Tobacco Use   Smoking status: Former    Types: Cigarettes    Quit date: 01/30/2006    Years since quitting: 16.6   Smokeless tobacco: Never  Vaping Use   Vaping Use: Never used  Substance and Sexual Activity   Alcohol use: Not Currently    Alcohol/week: 4.0 - 5.0 standard drinks of alcohol    Types: 4 - 5 Shots  of liquor per week    Comment: 3 glasses of whiskey per night.   Drug use: Not Currently    Frequency: 7.0 times per week    Types: Marijuana   Sexual activity: Not Currently  Other Topics Concern   Not on file  Social History Narrative   Not on file   Social Determinants of Health   Financial Resource Strain: Not on file  Food Insecurity: No Food Insecurity (07/21/2022)   Hunger Vital Sign    Worried About Running Out of Food in the Last Year: Never true    Ran Out of Food in the Last Year: Never true  Transportation Needs: No Transportation Needs (07/21/2022)   PRAPARE -  Hydrologist (Medical): No    Lack of Transportation (Non-Medical): No  Physical Activity: Not on file  Stress: Not on file  Social Connections: Not on file     Family History: The patient's family history is not on file.  ROS:   Please see the history of present illness.    All other systems reviewed and are negative.  EKGs/Labs/Other Studies Reviewed:    The following studies were reviewed today:   Recent Labs: 07/19/2022: Magnesium 1.9 07/23/2022: ALT 17; Hemoglobin 14.0; Platelets 326 08/01/2022: BUN 21; Creatinine, Ser 1.07; Potassium 3.6; Sodium 140  Recent Lipid Panel    Component Value Date/Time   CHOL 176 07/18/2022 0335   TRIG 241 (H) 07/18/2022 0335   TRIG 246 (H) 07/18/2022 0335   HDL 44 07/18/2022 0335   CHOLHDL 4.0 07/18/2022 0335   VLDL 48 (H) 07/18/2022 0335   LDLCALC 84 07/18/2022 0335    Physical Exam:    VS:  There were no vitals taken for this visit.    Wt Readings from Last 3 Encounters:  09/01/22 148 lb (67.1 kg)  07/21/22 157 lb 3 oz (71.3 kg)  06/28/22 158 lb (71.7 kg)     GEN: *** Well nourished, well developed in no acute distress CARDIAC: ***RRR, no murmurs, rubs, gallops RESPIRATORY:  Clear to auscultation without rales, wheezing or rhonchi        ASSESSMENT:    No diagnosis found. PLAN:    In order of problems listed above:   #Paroxysmal atrial fibrillation Low burden.  Suspect this was the culprit for his recent stroke.  I would not recommend elective procedures at this time that would require holding his anticoagulation.  Would wait at least 3 months and then minimize interruptions if at all possible.  We discussed the increased risk of recurrent stroke while off anticoagulation for elective procedures during today's visit. Continue Eliquis  Follow-up with APP in 1 year.     Medication Adjustments/Labs and Tests Ordered: Current medicines are reviewed at length with the patient today.   Concerns regarding medicines are outlined above.  No orders of the defined types were placed in this encounter.  No orders of the defined types were placed in this encounter.    Signed, Lars Mage, MD, [facility], [facility] 09/10/2022 5:21 AM    Electrophysiology [facility]

## 2022-09-13 ENCOUNTER — Ambulatory Visit: Payer: Medicare HMO | Admitting: Occupational Therapy

## 2022-09-13 ENCOUNTER — Ambulatory Visit: Payer: Medicare HMO

## 2022-09-13 DIAGNOSIS — R41841 Cognitive communication deficit: Secondary | ICD-10-CM | POA: Diagnosis not present

## 2022-09-13 DIAGNOSIS — R41840 Attention and concentration deficit: Secondary | ICD-10-CM

## 2022-09-13 DIAGNOSIS — I69318 Other symptoms and signs involving cognitive functions following cerebral infarction: Secondary | ICD-10-CM

## 2022-09-13 DIAGNOSIS — R1312 Dysphagia, oropharyngeal phase: Secondary | ICD-10-CM

## 2022-09-13 NOTE — Therapy (Signed)
OUTPATIENT OCCUPATIONAL THERAPY  Treatment Note  Patient Name: Douglas Cox MRN: [id number] DOB:1950/11/21, 72 y.o., male Today's Date: 09/13/2022  PCP: Dr. Zara Council ([facility]) REFERRING PROVIDER: Charlett Blake, MD  END OF SESSION:  OT End of Session - 09/13/22 1321     Visit Number 7    Number of Visits 15    Date for OT Re-Evaluation 10/08/22    Authorization Type [employer]    OT Start Time 1319    OT Stop Time 1400    OT Time Calculation (min) 41 min    Activity Tolerance Patient tolerated treatment well    Behavior During Therapy Flat affect;Restless                   Past Medical History:  Diagnosis Date   Celiac disease    Retinal detachment    Past Surgical History:  Procedure Laterality Date   CATARACT EXTRACTION Bilateral    Dr. Valetta Close   EYE SURGERY     FRACTURE SURGERY     GAS INSERTION Right 05/21/2021   Procedure: INSERTION OF GAS - C3F8;  Surgeon: Bernarda Caffey, MD;  Location: [place];  Service: Ophthalmology;  Laterality: Right;   GAS/FLUID EXCHANGE Right 05/21/2021   Procedure: GAS/FLUID EXCHANGE;  Surgeon: Bernarda Caffey, MD;  Location: [place];  Service: Ophthalmology;  Laterality: Right;   HERNIA REPAIR     IR CT HEAD LTD  07/17/2022   IR PERCUTANEOUS ART THROMBECTOMY/INFUSION INTRACRANIAL INC DIAG ANGIO  07/17/2022   IR US GUIDE VASC ACCESS RIGHT  07/17/2022   PARS PLANA VITRECTOMY Right 05/21/2021   Procedure: PARS PLANA VITRECTOMY WITH 25 GAUGE;  Surgeon: Bernarda Caffey, MD;  Location: [place];  Service: Ophthalmology;  Laterality: Right;   PERFLUORONE INJECTION Right 05/21/2021   Procedure: PERFLUORONE INJECTION;  Surgeon: Bernarda Caffey, MD;  Location: [place];  Service: Ophthalmology;  Laterality: Right;   PHOTOCOAGULATION WITH LASER Right 05/21/2021   Procedure: PHOTOCOAGULATION WITH LASER;  Surgeon: Bernarda Caffey, MD;  Location: [place];  Service: Ophthalmology;  Laterality: Right;   RADIOLOGY WITH ANESTHESIA N/A 07/17/2022    Procedure: IR WITH ANESTHESIA;  Surgeon: Radiologist, Medication, MD;  Location: [place];  Service: Radiology;  Laterality: N/A;   RETINAL DETACHMENT SURGERY     Patient Active Problem List   Diagnosis Date Noted   Paroxysmal atrial fibrillation ([place]) 09/01/2022   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 ([place]) 07/22/2022   Protein-calorie malnutrition, severe 07/19/2022   Stroke (cerebrum) ([place]) 07/17/2022   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   Celiac disease    Alcohol dependence ([place])    Bradycardia     ONSET DATE: 08/04/22 (referral date)  REFERRING DIAG: I63.81 (ICD-10-CM) - 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   Pt accompanied by: self and significant other (spouse)  PERTINENT HISTORY: 72 y.o. male with a PMHx of cataracts, celiac disease and retinal detachment who presents to the ED with acute onset of left hemiplegia, right gaze deviation and left hemineglect.MRI: Acute/subacute nonhemorrhagic infarct of the right caudate head and lentiform nucleus; acute/subacute nonhemorrhagic infarct of the left caudate head and lentiform nucleus spanning the internal capsule; Acute/subacute nonhemorrhagic infarct of the cortex of the right temporal tip.  PRECAUTIONS: Fall  WEIGHT BEARING  RESTRICTIONS: No  PAIN:  Are you having pain? No  FALLS: Has patient fallen in last 6 months? Yes. Number of falls 2 - 1 when he had the stroke he fell forward and hit his head and fell off a ladder  LIVING ENVIRONMENT: Lives with: lives with their spouse Lives in: House/apartment Stairs: Yes: Internal: 16 steps; on right going up and External: 4 steps to front door, 16 steps from garage to  main floor of home steps; on right going up Has following equipment at home: Single point cane  PLOF: Independent, working as an Pension scheme manager  PATIENT GOALS: to improve thought process, speech  OBJECTIVE:   HAND DOMINANCE: Right  ADLs: Overall ADLs: Independent with all bathing/dressing.    IADLs: Shopping: Not interested in going to the store, but would have gone prior to CVA Light housekeeping: is able to assist with household tasks when spouse asks for assistance or directs him to complete Meal Prep: Spouse is proving supervision for meal prep, but has been able to cook fish Community mobility: MD has not cleared pt to drive Medication management: Spouse administers medication, is having difficulty with administering B12 shot Financial management: Spouse is currently paying bills, but pt would have completed prior to CVA   COORDINATION: Finger Nose Finger test: mild ataxia, dysmetria LUE 9 Hole Peg test: Right: 32.37 sec; Left: 50.04 sec  Noted some difficulty with sequencing, requiring cues for recall and sequencing of directions Box and Blocks:  Right 38 blocks, Left 40 blocks (Pt frequently picking up 2-3 blocks at a time despite mod-max cues to pick up only one block at a time.    COGNITION: Overall cognitive status: Impaired Orientation Level: Person;Place;Situation Person: Oriented Place: Disoriented Situation: Disoriented Memory: Impaired Memory Impairment: Storage deficit;Retrieval deficit;Decreased recall of new information;Decreased short term memory Attention: Focused Awareness: Impaired Awareness Impairment: Intellectual impairment Problem Solving: Impaired Problem Solving Impairment: Verbal basic;Functional basic Behaviors: Restless;Impulsive Safety/Judgment: Impaired  OBSERVATIONS: Pt demonstrating attention at focused level during 9 hole peg test, requiring frequent cues and redirection to attend to 2 step task with Box and Blocks.  Pt demonstrating decreased  interest and motivation during OT evaluation, however pt and spouse agreeable to continue with OT at this time to focus on memory and return to engagement in IADLs and leisure pursuits.  TODAY'S TREATMENT:                                  09/13/22 Meal prep: Pt's spouse reports that pt has cooked fish a few times over the past few weeks without any issue.  Spouse notes that he does not seem motivated to prepare breakfast as he would have prior to stroke.  Pt's spouse reports no safety concerns with use of knives or microwave/toaster, however has left the stove top burner on a few times. Apple/smart watch: discussed various options of smart watches to provide additional safety features with HR monitoring for Afib and ability to contact EMS with falls.  Discussed concern with ease of use due to decreased initiation and memory. Complex planning: engaged in simulated complex planning activities such as: purchasing flowers, planning a move, and planning a trip.  OT incorporated handwriting with pt making a list of tasks with each activity.  Pt's handwriting started out at moderate size, quickly decreased with severe micrographia.  Pt requiring mod cues for sequencing and completion of each task.  Pt unable to complete simulate planning  to move, therefore transitioned to planning for a fishing trip.  Pt continuing to demonstrate micragraphia with decreased receptiveness to use of lines for increased sizing of letters.  OT encouraged pt and spouse to complete simulated meal planning to complete simple planning as complex still quite challenging.    09/08/22 Functional mobility/alternating attention: engaged in ball toss with BUE while sidestepping for balance challenge. Pt demonstrating good sequencing with sidestepping with only ball toss.  OT incorporated cognitive challenge with naming animals in alphabetical order to challenge cognition with sequencing and alternating attention. Pt with increased difficulty  with alternating attention, frequently stopping to think and then toss the ball requiring cues to resume sidestepping.  Pt also with decreased fluidity and body alignment with tossing ball with increased cognitive challenge. Downgraded to walking forward while tossing ball to self and counting forwards and backwards.  Pt with increased difficulty with counting backwards.   Memory/alternating attention: simulated ordering from menu with focus on recall and mental math to not overspend.  Pt with decreased mental math, required writing down items to complete math.  Pt overspending by 50 cents. Handwriting: with significant micrographia and deceased legibility when writing down quickly.  OT encouraged pt to slow down, adhere to lines to increase size, and attempt printing to allow for start and stop with each letter.    09/06/22 Pill box assessment: Completed in 5:55 with ability to correct one error with prescription of "every other day" without cues.  Pt also choosing to place "twice a day with breakfast and dinner" in morn and bed slot, completing bed slot after 5 min time limit expired.  OT engaged in discussion with pt and spouse on specific prescription and pt's rationale for placing in bed slot vs evening/dinner slot.  Discussed possible implications of changing times on prescribed medications.  Encouraged pt and wife to complete meds together to increase pt participation and "ownership" of medication management.   Organization/handwriting: engaged in complex planning/problem solving prompt requiring pt to organize items into systematic schedule to complete all in time allotted.  Discussed prioritizing activities to ensure completion of tasks.  Pt requiring mod assist to setup task with OT listing all tasks with pt then able to write order of stops on list with times noted.  Pt demonstrating legible handwriting, however still with intermittent micrographia.  Pt benefiting from mod cues for organization and  prioritization of tasks based on complexity and importance.  OT encouraged pt to prioritize daily tasks to increase initiation and participation in more complex sequencing.  PATIENT EDUCATION: Education details: Educated on Estate agent during daily tasks Person educated: Patient and Spouse Education method: Explanation Education comprehension: verbalized understanding and needs further education  HOME EXERCISE PROGRAM: TBD   GOALS: Goals reviewed with patient? Yes  SHORT TERM GOALS: Target date: 09/03/22  Pt will be able to identify/verbalize steps and/or items needed to complete simple functional task (simple snack prep, laundry task, etc) with min cues. Baseline: Goal status: IN PROGRESS  2.  Patient will be able to maintain selective attention for 2 mins during structured task without additional cues for attention.  Baseline:  Goal status: MET - 08/31/22  3.  Pt will complete table top scanning activity with min cues for technique Baseline:  Goal status: MET - 08/31/22  5.  Pt will be able to utilize phone to locate contacts and make emergency calls as needed without cues. Baseline:  Goal status: IN PROGRESS   LONG TERM GOALS: Target date: 09/24/22  Pt  will demonstrate ability to sequence simple functional task (simple snack prep, laundry task, etc) at Mod I level. Baseline:  Goal status: IN PROGRESS  2.  Pt will navigate a moderately busy environment, following multi-step commands with 90% accuracy to simulate return to community reintegration. Baseline:  Goal status: IN PROGRESS  3.  Pt will be able to utilize phone to look up phone number for physician and/or other frequently used number/location without cues. Baseline:  Goal status: IN PROGRESS  4.  Pt will be able to verbalize wayfinding/directions from home to familiar locations in the area without cues. Baseline:  Goal status: IN PROGRESS  5.  Pt will complete pill box assessment with  <5 errors in 5 min time limit to demonstrate increased attention to task. Baseline:  Goal status: IN PROGRESS  6.  Pt will complete MOCA and demonstrate improved cognitive function on MOCA by 3 points. Baseline: TBD Goal status: REVISED  - D/C 09/13/22  ASSESSMENT:  CLINICAL IMPRESSION: Pt with decreased initiation and/or motivation when participating in simple and complex planning activities to include handwriting.  Pt maintaining significant micrographia despite cues for use of lines, slowing down, and printing for increased size.  Pt's spouse receptive to ways these tasks can be implemented into daily, routine tasks.  PERFORMANCE DEFICITS: in functional skills including IADLs, coordination, Fine motor control, Gross motor control, body mechanics, endurance, decreased knowledge of precautions, decreased knowledge of use of DME, and UE functional use, cognitive skills including attention, energy/drive, memory, orientation, problem solving, safety awareness, sequencing, and temperament/personality, and psychosocial skills including coping strategies, habits, and routines and behaviors.   IMPAIRMENTS: are limiting patient from IADLs and leisure.   CO-MORBIDITIES: may have co-morbidities  that affects occupational performance. Patient will benefit from skilled OT to address above impairments and improve overall function.  MODIFICATION OR ASSISTANCE TO COMPLETE EVALUATION: Min-Moderate modification of tasks or assist with assess necessary to complete an evaluation.  OT OCCUPATIONAL PROFILE AND HISTORY: Detailed assessment: Review of records and additional review of physical, cognitive, psychosocial history related to current functional performance.  CLINICAL DECISION MAKING: Moderate - several treatment options, min-mod task modification necessary  REHAB POTENTIAL: Good  EVALUATION COMPLEXITY: Moderate    PLAN:  OT FREQUENCY: 1-2x/week  OT DURATION: 6 weeks  PLANNED INTERVENTIONS:  self care/ADL training, therapeutic exercise, therapeutic activity, balance training, functional mobility training, patient/family education, cognitive remediation/compensation, psychosocial skills training, energy conservation, coping strategies training, and DME and/or AE instructions  RECOMMENDED OTHER SERVICES: NA  CONSULTED AND AGREED WITH PLAN OF CARE: Patient and family member/caregiver  PLAN FOR NEXT SESSION: focused and selective attention - functional tasks with providing directions, making phone calls, preparing simple meal.  Complex/simple organization/planning   Braven Wolk, [place], OTR/L 09/13/2022, 1:22 PM

## 2022-09-13 NOTE — Therapy (Signed)
OUTPATIENT SPEECH LANGUAGE PATHOLOGY TREATMENT/Progress note   Patient Name: Douglas Cox MRN: [id number] DOB:1951/04/15, 72 y.o., male Today's Date: 09/13/2022  PCP: None Recorded REFERRING PROVIDER: Alysia Penna, MD  END OF SESSION:  End of Session - 09/13/22 1407     Visit Number 10    Number of Visits 25    Date for SLP Re-Evaluation 11/09/22    SLP Start Time 1235    SLP Stop Time  1315    SLP Time Calculation (min) 40 min    Activity Tolerance Patient tolerated treatment well                   Past Medical History:  Diagnosis Date   Celiac disease    Retinal detachment    Past Surgical History:  Procedure Laterality Date   CATARACT EXTRACTION Bilateral    Dr. Valetta Close   EYE SURGERY     FRACTURE SURGERY     GAS INSERTION Right 05/21/2021   Procedure: INSERTION OF GAS - C3F8;  Surgeon: Bernarda Caffey, MD;  Location: [place];  Service: Ophthalmology;  Laterality: Right;   GAS/FLUID EXCHANGE Right 05/21/2021   Procedure: GAS/FLUID EXCHANGE;  Surgeon: Bernarda Caffey, MD;  Location: [place];  Service: Ophthalmology;  Laterality: Right;   HERNIA REPAIR     IR CT HEAD LTD  07/17/2022   IR PERCUTANEOUS ART THROMBECTOMY/INFUSION INTRACRANIAL INC DIAG ANGIO  07/17/2022   IR US GUIDE VASC ACCESS RIGHT  07/17/2022   PARS PLANA VITRECTOMY Right 05/21/2021   Procedure: PARS PLANA VITRECTOMY WITH 25 GAUGE;  Surgeon: Bernarda Caffey, MD;  Location: [place];  Service: Ophthalmology;  Laterality: Right;   PERFLUORONE INJECTION Right 05/21/2021   Procedure: PERFLUORONE INJECTION;  Surgeon: Bernarda Caffey, MD;  Location: [place];  Service: Ophthalmology;  Laterality: Right;   PHOTOCOAGULATION WITH LASER Right 05/21/2021   Procedure: PHOTOCOAGULATION WITH LASER;  Surgeon: Bernarda Caffey, MD;  Location: [place];  Service: Ophthalmology;  Laterality: Right;   RADIOLOGY WITH ANESTHESIA N/A 07/17/2022   Procedure: IR WITH ANESTHESIA;  Surgeon: Radiologist, Medication, MD;  Location: [place];  Service: Radiology;  Laterality: N/A;   RETINAL DETACHMENT SURGERY     Patient Active Problem List   Diagnosis Date Noted   Paroxysmal atrial fibrillation ([name]) 09/01/2022   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 ([name]) 07/22/2022   Protein-calorie malnutrition, severe 07/19/2022   Stroke (cerebrum) ([name]) 07/17/2022   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   Celiac disease    Alcohol dependence ([name])    Bradycardia    Speech Therapy Progress Note  Dates of Reporting Period: 08/11/22 to present  Subjective Statement: Pt has been seen for [name] visits for primarily cognitive communication skills and also dysphagia.  Objective: See below.   Goal Update: See below  Plan: Cont to treat pt's cognitive communication.  Reason Skilled Services are Required: Pt has not maxed rehab potential.    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  TNK given and recanalization acheived with thrombectomy.  Later at 1800 noted right sided  weakness and new CT Head and CTA head and neck reordered and negative for reocclusion. MRI today shows bilateral basal ganglia infarcts. ASA 51m started 12/17  Pt with ETT for thrombectomy and overnight.  Extubated 12/17    PAIN:  Are you having pain? No   PATIENT GOALS: Pt did not answer question when asked, due to attention/processing deficits  OBJECTIVE:   DIAGNOSTIC FINDINGS:  FEES 07/30/22 Pt demonstrates a moderate oropharyngeal  dysphagia that is impacted by patient's current cognitive deficits. Patient's oral phase is characterized by poor awareness of bolus resulting in inconsistent timing of swallowing trigger. With solids, patient needed verbal cues (snapping and counting) to attend and initiate a swallow with all boluses contained in the velleculae. With liquids, patient consistently triggered his swallow at the pyriform sinuses. This resulted in sensed aspiration of thin liquids via cup. Patient also with inconsistent amounts of pharyngeal residue that cleared with a 2nd subsequent swallow. No penetration or aspiration noted with nectar-thick liquids except for 1 episode of flash penetration. Provided thorough education to the patient's wife regarding patient's current swallowing function and that he is currently at a higher risk of aspiration due to cognitive deficits. She verbalized understanding and agreement with recommendations of continuing Dys. 3 textures but upgrading to nectar-thick liquids via cup with strict aspiration precautions and supervision. This will hopefully improve patient's intake of liquids with improved ability to stay hydrated without IV fluids prior to potential discharge on 08/03/22. SLP Diet Recommendations Dysphagia 3 (Mech soft) solids;Nectar thick liquid Liquid Administration via Cup;Spoon;No straw Medication Administration Crushed with puree Compensations: Slow rate;Small sips/bites;Minimize environmental distractions;Multiple dry swallows after each bite/sip  Today, pt ate dys III and drank nectar liqiuds. See "clinicial impressions" for results.  STANDARDIZED ASSESSMENTS: CLQT: may need to be completed in first 1-3 sessions  PATIENT REPORTED OUTCOME MEASURES (PROM): Cognitive function: Short Form: to be administered in first 1-3 sessions   TODAY'S TREATMENT:                                                                                                                                          DATE:  09/13/22: Pt needs cognitive PROM, CLQT. Consider [place] Naming Test. Pt and wife stated pt coughed with corn chips last night so SLP assessed pt with peanut butter crackers today without any overt s/sx aspiration or any oral stage difficulty, nor any other pharyngeal stage deficits. Pt deemed/appears safe with regular food items. Wife stated pt ate with bean dip but complained of pharyngeal stasis after coughing. SLP postulates if this was an anomalous incident as pt has not reported ANY coughing (nor wife endorsing any coughing) with POs.  Today SLP measured pt's loud /a/ with "hey" and with loud /a/ - variable loudness responses (never >73dB) between 62-73 dB. Loud /a/ averaged in upper 60s dB with usual mod A for loudness. Pt tearful about his singing voice;  initially pt's voice very tremulous and this subsided over time today. Pt told to practice 10 loud /a/ BID.  Pt recalled 3/7 details about this weekend's events. Wife with excellent cues for pt's memory. Today, pt initiated sentences with 3-4 words and then he paused for 5-8 seconds of silence prior to completing sentence; 20% of the time pt abandoned sentence/utterance.  09/08/22: Pt was doing bills this morning and called about a incr in their mortgage amount, independently. SLP asked pt to [facility] and pt able to do so (and noted SLP error) without 2/4 road names (aware of one error). Pt recalled 3/5 items from today's schedule prior to ST. He did not think or attempt to write down 3 medication names. He "bet" SLP he would remember to bring his vitamin D next session. Held attention for 8 minutes prior to observation of loss of attention. At times pt stops verbal expression as if he might be experiencing anomia.  SLP to consider administering [place] (BNT-2).   09/06/22: Pt was able to hold 7-8 minute conversation with SLP today in therapy, x3. Speech volume not as loud as Friday 09/03/22, and pt's  processing appeared slower today in general than on Friday 09/03/22. Pt had busy weekend and made schedule for the day with [facility] assistance. 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 Pt to cont to follow schedule to encourage brain stimulating tasks at home.  CONSIDER ADMINISTRATION OF CLQT for obtaining a baseline of pt's cognitive linguistics.  09/03/22: Pt recalled 3 iof his questions and the answers from Dr. Naaman Plummer appointment spontaneously. SLP and wife provided the other two with min-mod A and total A. Pt recalled one answer from these two spontaneously. Pt held attention for 10 minutes in conversation about this appointment and about exercise.   08/31/22: Pt and wife deny cont'd overt s/sx aspiration with meals. Pt still does not take meds with water due to "They are bitter." Douglas Cox said pt has Dr. Naaman Plummer tomorrow so SLP had pt write down questions he has for Dr. Naaman Plummer. He thought of 2 spontaneously (5 with mod-max cues), and req'd consistent extra time to think of questions, mod A consistently for legible writing (decr'd emergent awareness), mod A usually for writing question in a format he would recall tomorrow (decr'd anticipatory awareness). Douglas Cox remarked that often she is having to encourage pt to get up from the chair to do things during the day. SLP strongly suggested a schedule and provided example of this, and also reiterated that pt will have decr'd ability to initiate tasks at this time and may require wife's cues to do daily tasks. SLP provided example of SLP calling pt's name in waiting room and him not arising until wife arises. Pt showing some incr'd awareness asking SLP if he will be able to return to things he was doing pre-CVA. 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  1/25:24: Wife asked if pt was safe for thin liquids. SLP  told pt/wife that if pt took single sips and was not exhibiting coughing he could have thin liquids. SLP to assess next session. In a quiet environment, pt completed written math task for 9 minutes prior to requiring 5 minute break, evidenced by difficulty with/inability in adding simple figures. Pt impulsive about  starting back to task again and req'd SLP cueing for longer break. After 6 minutes, pt said, "Get back to this again," and re-focused on written math task- processing speed improved for 3 more entries until pt had to leave. SLP educated/highlighted to pt and wife the importance of taking breaks when he feels "stuck".   08/24/22: speech/cognition: Pt demonstrated sustained attention for 7 minutes today for therapy task of writing down reasons for each med. He demonstrated attention adequate to recall 3/5 reasons 2 minutes later, when given extra time, and required mod A with other two. He continues to perform attention lengthening tasks at home. Wife has downloaded [employer] to her phone and has directed pt to complete. Pt rec'd cont'd rationale today for his meds - he "does not like taking pills." Pt inquired reason for CVA today in session and was told by wife and SLP. Today, pt's reasoning questionable with rationale to not take med/s with pureed but pt indicated he did not enjoy the smoothies wife was making for him with meds in them.  Swallowing: SLP discussed ways to assist pt in taking meds as they are currently being crushed and put into a smoothie for pt; "Do I have to have the smoothies" pt inquired. SLP suggested meds with applesauce, and yogurt, one pureed item at a time, and pt declined each time. SLP, pt, and wife eventually agreed pt may attempt one med with pureed item until next session.   PATIENT EDUCATION: Education details: see "today's treatment" Person educated: Patient and Spouse Education method: Explanation and Demonstration Education comprehension: verbalized  understanding and needs further education   GOALS: Goals reviewed with patient? No  SHORT TERM GOALS: Target date: 2/9/2, 09/27/22 (revised 2/12/224)  Pt will follow aspiration precautions from FEES with occasional min A in 2 sessions Baseline: Goal status: deferred - pt not coughing with POs  2.  Tedarius will demo sustained attention to participate in 10 minute therapy task x3/session, in 3 sessions Baseline: 09/03/22 Goal status: partially met and ongoing  3.  Pt will demo understanding of written cues in simple therapy tasks, with initial cue allowed, in 3 sessions Baseline: 08-19-22, 09-03-22 Goal status: Met  4.  Pt will demo selective attention for 10 minutes to a simple therapy task in a min-mod noisy environment Baseline:  Goal status: not met and Ongoing  5.  Pt/wife will tell SLP 3 overt s/sx aspiration PNA with modified independence Baseline:  Goal status: Deferred - pt  no longer couhging with POs  6   Using compensations, pt will ask wife to give him his medications, at least once/day between 3 sessions Baseline:  Goal status: not met and Ongoing   LONG TERM GOALS: Target date: 11/09/22  Pt will follow aspiration precautions from FEES with rare min A in 3 sessions Baseline:  Goal status: Ongoing  2.  Pt will follow aspiration precautions from FEES with modified independence in 2 sessions Baseline:  Goal status: Ongoing  3.  Pt will demo selective attention for 20 minutes to a simple therapy task in a min-mod noisy environment Baseline:  Goal status: Ongoing  4.  Pt will demo emergent awareness in simple therapy tasks with verbal cue for error awareness in 3 sessions Baseline:  Goal status: Ongoing  5.  Pt will demo alternating attention for 20 minutes to complete simple-mod complex therapy tasks 100% accuracy in 3 sessions Baseline:  Goal status: Ongoing  6.  Using compensations, pt will recall to administer his  own medications correctly, at least once/day  between 3 sessions Baseline:  Goal status: Ongoing  7.  Wife's PROM (due to pt's attention deficit) score for pt will improve in last 1-2 sessions than the score taken in the first 1-2 sessions Baseline:  Goal status: Ongoing  ASSESSMENT:  CLINICAL IMPRESSION: Patient is a 72 y.o. male who was seen today for treatment of simple cognition (primarily attention) and for swallowing skills in light of CVA 07-17-22. Sustained attention noted for 10 minutes in conversation. Pt swallowing is improving as cognition improves.   OBJECTIVE IMPAIRMENTS: include attention, memory, awareness, executive functioning, receptive language, and dysphagia. These impairments are limiting patient from return to work, managing medications, managing appointments, managing finances, household responsibilities, ADLs/IADLs, effectively communicating at home and in community, and safety when swallowing. Factors affecting potential to achieve goals and functional outcome are ability to learn/carryover information, cooperation/participation level, and severity of impairments.. Patient will benefit from skilled SLP services to address above impairments and improve overall function.  REHAB POTENTIAL: Good  PLAN:  SLP FREQUENCY: 2x/week  SLP DURATION: 12 weeks  PLANNED INTERVENTIONS: Aspiration precaution training, Pharyngeal strengthening exercises, Diet toleration management , Environmental controls, Trials of upgraded texture/liquids, Cueing hierachy, Cognitive reorganization, Internal/external aids, Multimodal communication approach, SLP instruction and feedback, Compensatory strategies, and Patient/family education    [facility], [place] 09/13/2022, 2:08 PM

## 2022-09-15 ENCOUNTER — Ambulatory Visit: Payer: Medicare HMO | Admitting: Occupational Therapy

## 2022-09-15 ENCOUNTER — Ambulatory Visit: Payer: Medicare HMO

## 2022-09-15 ENCOUNTER — Telehealth: Payer: Self-pay

## 2022-09-15 DIAGNOSIS — R41841 Cognitive communication deficit: Secondary | ICD-10-CM

## 2022-09-15 DIAGNOSIS — R41840 Attention and concentration deficit: Secondary | ICD-10-CM

## 2022-09-15 DIAGNOSIS — M6281 Muscle weakness (generalized): Secondary | ICD-10-CM

## 2022-09-15 DIAGNOSIS — I69318 Other symptoms and signs involving cognitive functions following cerebral infarction: Secondary | ICD-10-CM

## 2022-09-15 NOTE — Therapy (Signed)
OUTPATIENT SPEECH LANGUAGE PATHOLOGY TREATMENT   Patient Name: Douglas Cox MRN: [id number] DOB:[birth year], 72 y.o., male Today's Date: 09/15/2022  PCP: None Recorded REFERRING PROVIDER: Alysia Penna, MD  END OF SESSION:  End of Session - 09/15/22 1434     Visit Number 11    Number of Visits 25    Date for SLP Re-Evaluation 11/09/22    SLP Start Time 1233    SLP Stop Time  1315    SLP Time Calculation (min) 42 min    Activity Tolerance Patient tolerated treatment well                    Past Medical History:  Diagnosis Date   Celiac disease    Retinal detachment    Past Surgical History:  Procedure Laterality Date   CATARACT EXTRACTION Bilateral    Dr. Valetta Cox   EYE SURGERY     FRACTURE SURGERY     GAS INSERTION Right 05/21/2021   Procedure: INSERTION OF GAS - C3F8;  Surgeon: Douglas Caffey, MD;  Location: [facility];  Service: Ophthalmology;  Laterality: Right;   GAS/FLUID EXCHANGE Right 05/21/2021   Procedure: GAS/FLUID EXCHANGE;  Surgeon: Douglas Caffey, MD;  Location: [facility];  Service: Ophthalmology;  Laterality: Right;   HERNIA REPAIR     IR CT HEAD LTD  07/17/2022   IR PERCUTANEOUS ART THROMBECTOMY/INFUSION INTRACRANIAL INC DIAG ANGIO  07/17/2022   IR US GUIDE VASC ACCESS RIGHT  07/17/2022   PARS PLANA VITRECTOMY Right 05/21/2021   Procedure: PARS PLANA VITRECTOMY WITH 25 GAUGE;  Surgeon: Douglas Caffey, MD;  Location: [facility];  Service: Ophthalmology;  Laterality: Right;   PERFLUORONE INJECTION Right 05/21/2021   Procedure: PERFLUORONE INJECTION;  Surgeon: Douglas Caffey, MD;  Location: [facility];  Service: Ophthalmology;  Laterality: Right;   PHOTOCOAGULATION WITH LASER Right 05/21/2021   Procedure: PHOTOCOAGULATION WITH LASER;  Surgeon: Douglas Caffey, MD;  Location: [facility];  Service: Ophthalmology;  Laterality: Right;   RADIOLOGY WITH ANESTHESIA N/A 07/17/2022   Procedure: IR WITH ANESTHESIA;  Surgeon: Radiologist, Medication, MD;  Location: [facility];   Service: Radiology;  Laterality: N/A;   RETINAL DETACHMENT SURGERY     Patient Active Problem List   Diagnosis Date Noted   Paroxysmal atrial fibrillation ([place]) 09/01/2022   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 ([place]) 07/22/2022   Protein-calorie malnutrition, severe 07/19/2022   Stroke (cerebrum) ([place]) 07/17/2022   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   Celiac disease    Alcohol dependence ([place])    Bradycardia      ONSET DATE: 07/17/22   REFERRING DIAG: I63.81 (ICD-10-CM) - Other cerebral infarction due to occlusion or stenosis of small artery  THERAPY DIAG:  Cognitive communication deficit  Rationale for Evaluation and Treatment: Rehabilitation  SUBJECTIVE:   SUBJECTIVE STATEMENT: "Who sang that? It's a marvelous night for a moondance... (singing)."  Pt accompanied by: significant other Douglas Cox (wife)  PERTINENT HISTORY: Douglas Cox presented to the ED with acute onset of left hemiplegia, right gaze deviation and left hemineglect while working in his garage.  TNK given and recanalization acheived with thrombectomy.  Later at 1800 noted right sided weakness and new CT Head and CTA head and neck reordered and negative for reocclusion. MRI today shows bilateral basal ganglia infarcts. ASA 40m started 12/17  Pt with ETT for thrombectomy and overnight.  Extubated 12/17    PAIN:  Are you having pain? No   PATIENT GOALS: Pt did not answer question when asked, due to attention/processing deficits  OBJECTIVE:   DIAGNOSTIC FINDINGS:  FEES 07/30/22 Pt demonstrates a moderate oropharyngeal dysphagia that is impacted by patient's current cognitive deficits. Patient's oral phase is characterized by poor awareness of bolus resulting in inconsistent timing of swallowing trigger. With solids, patient needed verbal cues (snapping and counting) to attend and initiate a swallow with all boluses contained in the velleculae. With liquids, patient consistently triggered his swallow at the  pyriform sinuses. This resulted in sensed aspiration of thin liquids via cup. Patient also with inconsistent amounts of pharyngeal residue that cleared with a 2nd subsequent swallow. No penetration or aspiration noted with nectar-thick liquids except for 1 episode of flash penetration. Provided thorough education to the patient's wife regarding patient's current swallowing function and that he is currently at a higher risk of aspiration due to cognitive deficits. She verbalized understanding and agreement with recommendations of continuing Dys. 3 textures but upgrading to nectar-thick liquids via cup with strict aspiration precautions and supervision. This will hopefully improve patient's intake of liquids with improved ability to stay hydrated without IV fluids prior to potential discharge on 08/03/22. SLP Diet Recommendations Dysphagia 3 (Mech soft) solids;Nectar thick liquid Liquid Administration via Cup;Spoon;No straw Medication Administration Crushed with puree Compensations: Slow rate;Small sips/bites;Minimize environmental distractions;Multiple dry swallows after each bite/sip  Today, pt ate dys III and drank nectar liqiuds. See "clinicial impressions" for results.  STANDARDIZED ASSESSMENTS: CLQT: may need to be completed in first 1-3 sessions  PATIENT REPORTED OUTCOME MEASURES (PROM): Cognitive function: Short Form: to be administered in first 1-3 sessions   TODAY'S TREATMENT:                                                                                                                                         DATE:  09/15/22: SLP targeted attention and verbal expression today in a functional task. Pt was able to sustain attention for 10 minutes in two conversational tasks. 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 Pt recalled he won bet from Monday and SLP owes him 25 cents.  09/13/22: Pt needs cognitive PROM,  CLQT. Consider [place] Naming Test. Pt and wife stated pt coughed with corn chips last night so SLP assessed pt with peanut butter crackers today without any overt s/sx aspiration or any oral stage difficulty, nor any other pharyngeal stage deficits. Pt deemed/appears safe with regular food items. Wife stated pt ate with bean dip but complained of pharyngeal stasis after coughing. SLP postulates if this was an anomalous incident as pt has not reported ANY coughing (nor wife endorsing any coughing) with POs.  Today SLP measured pt's loud /a/ with "hey" and with loud /a/ - variable loudness responses (never >73dB) between 62-73 dB. Loud /a/ averaged in upper 60s dB with usual mod A for loudness. Pt tearful about his singing voice; initially pt's voice  very tremulous and this subsided over time today. Pt told to practice 10 loud /a/ BID.  Pt recalled 3/7 details about this weekend's events. Wife with excellent cues for pt's memory. Today, pt initiated sentences with 3-4 words and then he paused for 5-8 seconds of silence prior to completing sentence; 20% of the time pt abandoned sentence/utterance.  09/08/22: Pt was doing bills this morning and called about a incr in their mortgage amount, independently. SLP asked pt to [facility] and pt able to do so (and noted SLP error) without 2/4 road names (aware of one error). Pt recalled 3/5 items from today's schedule prior to ST. He did not think or attempt to write down 3 medication names. He "bet" SLP he would remember to bring his vitamin D next session. Held attention for 8 minutes prior to observation of loss of attention. At times pt stops verbal expression as if he might be experiencing anomia.  SLP to consider administering [place] (BNT-2).   09/06/22: Pt was able to hold 7-8 minute conversation with SLP today in therapy, x3. Speech volume not as loud as Friday 09/03/22, and pt's processing appeared slower today in general than on  Friday 09/03/22. Pt had busy weekend and made schedule for the day with [facility] assistance. 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 Pt to cont to follow schedule to encourage brain stimulating tasks at home.  CONSIDER ADMINISTRATION OF CLQT for obtaining a baseline of pt's cognitive linguistics.  09/03/22: Pt recalled 3 iof his questions and the answers from Dr. Naaman Plummer appointment spontaneously. SLP and wife provided the other two with min-mod A and total A. Pt recalled one answer from these two spontaneously. Pt held attention for 10 minutes in conversation about this appointment and about exercise.   08/31/22: Pt and wife deny cont'd overt s/sx aspiration with meals. Pt still does not take meds with water due to "They are bitter." Douglas Cox said pt has Dr. Naaman Plummer tomorrow so SLP had pt write down questions he has for Dr. Naaman Plummer. He thought of 2 spontaneously (5 with mod-max cues), and req'd consistent extra time to think of questions, mod A consistently for legible writing (decr'd emergent awareness), mod A usually for writing question in a format he would recall tomorrow (decr'd anticipatory awareness). Douglas Cox remarked that often she is having to encourage pt to get up from the chair to do things during the day. SLP strongly suggested a schedule and provided example of this, and also reiterated that pt will have decr'd ability to initiate tasks at this time and may require wife's cues to do daily tasks. SLP provided example of SLP calling pt's name in waiting room and him not arising until wife arises. Pt showing some incr'd awareness asking SLP if he will be able to return to things he was doing pre-CVA. 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  1/25:24: Wife asked if pt was safe for thin liquids. SLP told pt/wife that if pt took single sips and was not  exhibiting coughing he could have thin liquids. SLP to assess next session. In a quiet environment, pt completed written math task for 9 minutes prior to requiring 5 minute break, evidenced by difficulty with/inability in adding simple figures. Pt impulsive about starting back to  task again and req'd SLP cueing for longer break. After 6 minutes, pt said, "Get back to this again," and re-focused on written math task- processing speed improved for 3 more entries until pt had to leave. SLP educated/highlighted to pt and wife the importance of taking breaks when he feels "stuck".   08/24/22: speech/cognition: Pt demonstrated sustained attention for 7 minutes today for therapy task of writing down reasons for each med. He demonstrated attention adequate to recall 3/5 reasons 2 minutes later, when given extra time, and required mod A with other two. He continues to perform attention lengthening tasks at home. Wife has downloaded [employer] to her phone and has directed pt to complete. Pt rec'd cont'd rationale today for his meds - he "does not like taking pills." Pt inquired reason for CVA today in session and was told by wife and SLP. Today, pt's reasoning questionable with rationale to not take med/s with pureed but pt indicated he did not enjoy the smoothies wife was making for him with meds in them.  Swallowing: SLP discussed ways to assist pt in taking meds as they are currently being crushed and put into a smoothie for pt; "Do I have to have the smoothies" pt inquired. SLP suggested meds with applesauce, and yogurt, one pureed item at a time, and pt declined each time. SLP, pt, and wife eventually agreed pt may attempt one med with pureed item until next session.   PATIENT EDUCATION: Education details: see "today's treatment" Person educated: Patient and Spouse Education method: Explanation and Demonstration Education comprehension: verbalized understanding and needs further  education   GOALS: Goals reviewed with patient? No  SHORT TERM GOALS: Target date: 2/9/2, 09/27/22 (revised 2/12/224)  Pt will follow aspiration precautions from FEES with occasional min A in 2 sessions Baseline: Goal status: deferred - pt not coughing with POs  2.  N[place]iem will demo sustained attention to participate in 10 minute therapy task x3/session, in 3 sessions Baseline: 09/03/22 Goal status: partially met and ongoing  3.  Pt will demo understanding of written cues in simple therapy tasks, with initial cue allowed, in 3 sessions Baseline: 08-19-22, 09-03-22 Goal status: Met  4.  Pt will demo selective attention for 10 minutes to a simple therapy task in a min-mod noisy environment Baseline:  Goal status: not met and Ongoing  5.  Pt/wife will tell SLP 3 overt s/sx aspiration PNA with modified independence Baseline:  Goal status: Deferred - pt  no longer couhging with POs  6   Using compensations, pt will ask wife to give him his medications, at least once/day between 3 sessions Baseline:  Goal status: not met and Ongoing   LONG TERM GOALS: Target date: 11/09/22  Pt will follow aspiration precautions from FEES with rare min A in 3 sessions Baseline:  Goal status: Ongoing  2.  Pt will follow aspiration precautions from FEES with modified independence in 2 sessions Baseline:  Goal status: Ongoing  3.  Pt will demo selective attention for 20 minutes to a simple therapy task in a min-mod noisy environment Baseline:  Goal status: Ongoing  4.  Pt will demo emergent awareness in simple therapy tasks with verbal cue for error awareness in 3 sessions Baseline:  Goal status: Ongoing  5.  Pt will demo alternating attention for 20 minutes to complete simple-mod complex therapy tasks 100% accuracy in 3 sessions Baseline:  Goal status: Ongoing  6.  Using compensations, pt will recall to administer his own medications correctly,  at least once/day between 3 sessions Baseline:   Goal status: Ongoing  7.  Wife's PROM (due to pt's attention deficit) score for pt will improve in last 1-2 sessions than the score taken in the first 1-2 sessions Baseline:  Goal status: Ongoing  ASSESSMENT:  CLINICAL IMPRESSION: Patient is a 72 y.o. male who was seen today for treatment of simple cognition (primarily attention) and for swallowing skills in light of CVA 07-17-22. Sustained attention maintained for 10 minutes in conversation today's session as well. Pt swallowing is improving as cognition improves.   OBJECTIVE IMPAIRMENTS: include attention, memory, awareness, executive functioning, receptive language, and dysphagia. These impairments are limiting patient from return to work, managing medications, managing appointments, managing finances, household responsibilities, ADLs/IADLs, effectively communicating at home and in community, and safety when swallowing. Factors affecting potential to achieve goals and functional outcome are ability to learn/carryover information, cooperation/participation level, and severity of impairments.. Patient will benefit from skilled SLP services to address above impairments and improve overall function.  REHAB POTENTIAL: Good  PLAN:  SLP FREQUENCY: 2x/week  SLP DURATION: 12 weeks  PLANNED INTERVENTIONS: Aspiration precaution training, Pharyngeal strengthening exercises, Diet toleration management , Environmental controls, Trials of upgraded texture/liquids, Cueing hierachy, Cognitive reorganization, Internal/external aids, Multimodal communication approach, SLP instruction and feedback, Compensatory strategies, and Patient/family education    [facility], [place] 09/15/2022, 2:35 PM

## 2022-09-15 NOTE — Telephone Encounter (Signed)
Pharmacy please advise on holding Eliquis prior to single dental implant placement scheduled for 10/18/2022.  He was seen by Dr. Quentin Ore on 09/10/2022 and cleared for holding but will need bridging instructions.   Thank you.

## 2022-09-15 NOTE — Therapy (Signed)
OUTPATIENT OCCUPATIONAL THERAPY  Treatment Note  Patient Name: Douglas Cox MRN: [id number] DOB:1950/08/12, 72 y.o., male Today's Date: 09/15/2022  PCP: Dr. Zara Council ([facility]) REFERRING PROVIDER: Charlett Blake, MD  END OF SESSION:  OT End of Session - 09/15/22 1324     Visit Number 8    Number of Visits 15    Date for OT Re-Evaluation 10/08/22    Authorization Type [employer]    OT Start Time 1320    OT Stop Time 1400    OT Time Calculation (min) 40 min    Activity Tolerance Patient tolerated treatment well    Behavior During Therapy Flat affect;Restless                    Past Medical History:  Diagnosis Date   Celiac disease    Retinal detachment    Past Surgical History:  Procedure Laterality Date   CATARACT EXTRACTION Bilateral    Dr. Valetta Close   EYE SURGERY     FRACTURE SURGERY     GAS INSERTION Right 05/21/2021   Procedure: INSERTION OF GAS - C3F8;  Surgeon: Bernarda Caffey, MD;  Location: [place];  Service: Ophthalmology;  Laterality: Right;   GAS/FLUID EXCHANGE Right 05/21/2021   Procedure: GAS/FLUID EXCHANGE;  Surgeon: Bernarda Caffey, MD;  Location: [place];  Service: Ophthalmology;  Laterality: Right;   HERNIA REPAIR     IR CT HEAD LTD  07/17/2022   IR PERCUTANEOUS ART THROMBECTOMY/INFUSION INTRACRANIAL INC DIAG ANGIO  07/17/2022   IR US GUIDE VASC ACCESS RIGHT  07/17/2022   PARS PLANA VITRECTOMY Right 05/21/2021   Procedure: PARS PLANA VITRECTOMY WITH 25 GAUGE;  Surgeon: Bernarda Caffey, MD;  Location: [place];  Service: Ophthalmology;  Laterality: Right;   PERFLUORONE INJECTION Right 05/21/2021   Procedure: PERFLUORONE INJECTION;  Surgeon: Bernarda Caffey, MD;  Location: [place];  Service: Ophthalmology;  Laterality: Right;   PHOTOCOAGULATION WITH LASER Right 05/21/2021   Procedure: PHOTOCOAGULATION WITH LASER;  Surgeon: Bernarda Caffey, MD;  Location: [place];  Service: Ophthalmology;  Laterality: Right;   RADIOLOGY WITH ANESTHESIA N/A 07/17/2022    Procedure: IR WITH ANESTHESIA;  Surgeon: Radiologist, Medication, MD;  Location: [place];  Service: Radiology;  Laterality: N/A;   RETINAL DETACHMENT SURGERY     Patient Active Problem List   Diagnosis Date Noted   Paroxysmal atrial fibrillation ([place]) 09/01/2022   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 ([place]) 07/22/2022   Protein-calorie malnutrition, severe 07/19/2022   Stroke (cerebrum) ([place]) 07/17/2022   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   Celiac disease    Alcohol dependence ([place])    Bradycardia     ONSET DATE: 08/04/22 (referral date)  REFERRING DIAG: I63.81 (ICD-10-CM) - 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   Pt accompanied by: self and significant other (spouse)  PERTINENT HISTORY: 72 y.o. male with a PMHx of cataracts, celiac disease and retinal detachment who presents to the ED with acute onset of left hemiplegia, right gaze deviation and left hemineglect.MRI: Acute/subacute nonhemorrhagic infarct of the right caudate head and lentiform nucleus; acute/subacute nonhemorrhagic infarct of the left caudate head and lentiform nucleus spanning the internal capsule; Acute/subacute nonhemorrhagic infarct of the cortex of the right temporal tip.  PRECAUTIONS: Fall  WEIGHT BEARING RESTRICTIONS: No  PAIN:  Are you having pain? No  FALLS: Has patient fallen in last 6 months? Yes. Number of falls 2 - 1 when he had the stroke he fell forward and hit his head and fell off a ladder  LIVING ENVIRONMENT: Lives with: lives with their spouse Lives in: House/apartment Stairs: Yes: Internal: 16 steps; on right going up and External: 4 steps to front  door, 16 steps from garage to main floor of home steps; on right going up Has following equipment at home: Single point cane  PLOF: Independent, working as an Pension scheme manager  PATIENT GOALS: to improve thought process, speech  OBJECTIVE:   HAND DOMINANCE: Right  ADLs: Overall ADLs: Independent with all bathing/dressing.    IADLs: Shopping: Not interested in going to the store, but would have gone prior to CVA Light housekeeping: is able to assist with household tasks when spouse asks for assistance or directs him to complete Meal Prep: Spouse is proving supervision for meal prep, but has been able to cook fish Community mobility: MD has not cleared pt to drive Medication management: Spouse administers medication, is having difficulty with administering B12 shot Financial management: Spouse is currently paying bills, but pt would have completed prior to CVA   COORDINATION: Finger Nose Finger test: mild ataxia, dysmetria LUE 9 Hole Peg test: Right: 32.37 sec; Left: 50.04 sec  Noted some difficulty with sequencing, requiring cues for recall and sequencing of directions Box and Blocks:  Right 38 blocks, Left 40 blocks (Pt frequently picking up 2-3 blocks at a time despite mod-max cues to pick up only one block at a time.    COGNITION: Overall cognitive status: Impaired Orientation Level: Person;Place;Situation Person: Oriented Place: Disoriented Situation: Disoriented Memory: Impaired Memory Impairment: Storage deficit;Retrieval deficit;Decreased recall of new information;Decreased short term memory Attention: Focused Awareness: Impaired Awareness Impairment: Intellectual impairment Problem Solving: Impaired Problem Solving Impairment: Verbal basic;Functional basic Behaviors: Restless;Impulsive Safety/Judgment: Impaired  OBSERVATIONS: Pt demonstrating attention at focused level during 9 hole peg test, requiring frequent cues and redirection to attend to 2 step task with Box and Blocks.   Pt demonstrating decreased interest and motivation during OT evaluation, however pt and spouse agreeable to continue with OT at this time to focus on memory and return to engagement in IADLs and leisure pursuits.  TODAY'S TREATMENT:                                  09/15/22 Attention: pt's spouse noting that pt is more distractible, especially in regards to cell phone and when TV is on. Discussed volume of alerts on cell phone causing break in focused or sustained attention during task as pt still with decreased selective and alternating attention.  Selective/alternating attention: engaged in ambulatory task incorporating naming items while tapping cones with feet.  Pt with decreased ability to maintain selective attention to task in moderately distracting gym space.  OT challenging pt to increase pace with toe tapping and naming items, however pt unable to complete both simultaneously.  Pt with increased success with naming task when in quiet room vs in moderately distracting gym space. Phone use: OT provided prompt for pt to call to cancel an appt.  Pt initially unable to recall clinic name, however once provided with name pt attempting to search in messages app.  Pt requiring mod cues to back out of messaging app, then opening email app.  OT providing mod verbal cues to close out all apps and locate internet browser on phone.  Pt able to open internet browser but unable to search for clinic site due to time constraints.    09/13/22 Meal prep: Pt's spouse reports that pt has cooked fish a few times over the past few weeks without any issue.  Spouse notes that he does not seem motivated to prepare breakfast as he would have prior to stroke.  Pt's spouse reports no safety concerns with use of knives or microwave/toaster, however has left the stove top burner on a few times. Apple/smart watch: discussed various options of smart watches to provide additional safety features with HR monitoring for Afib and  ability to contact EMS with falls.  Discussed concern with ease of use due to decreased initiation and memory. Complex planning: engaged in simulated complex planning activities such as: purchasing flowers, planning a move, and planning a trip.  OT incorporated handwriting with pt making a list of tasks with each activity.  Pt's handwriting started out at moderate size, quickly decreased with severe micrographia.  Pt requiring mod cues for sequencing and completion of each task.  Pt unable to complete simulate planning to move, therefore transitioned to planning for a fishing trip.  Pt continuing to demonstrate micragraphia with decreased receptiveness to use of lines for increased sizing of letters.  OT encouraged pt and spouse to complete simulated meal planning to complete simple planning as complex still quite challenging.    09/08/22 Functional mobility/alternating attention: engaged in ball toss with BUE while sidestepping for balance challenge. Pt demonstrating good sequencing with sidestepping with only ball toss.  OT incorporated cognitive challenge with naming animals in alphabetical order to challenge cognition with sequencing and alternating attention. Pt with increased difficulty with alternating attention, frequently stopping to think and then toss the ball requiring cues to resume sidestepping.  Pt also with decreased fluidity and body alignment with tossing ball with increased cognitive challenge. Downgraded to walking forward while tossing ball to self and counting forwards and backwards.  Pt with increased difficulty with counting backwards.   Memory/alternating attention: simulated ordering from menu with focus on recall and mental math to not overspend.  Pt with decreased mental math, required writing down items to complete math.  Pt overspending by 50 cents. Handwriting: with significant micrographia and deceased legibility when writing down quickly.  OT encouraged pt to slow down, adhere  to lines to increase size, and attempt printing to allow for start and stop with each letter.   PATIENT EDUCATION: Education details: Educated on Estate agent during daily tasks Person educated: Patient and Spouse Education method: Explanation Education comprehension: verbalized understanding and needs further education  HOME EXERCISE PROGRAM: TBD   GOALS: Goals reviewed with patient? Yes  SHORT TERM GOALS: Target date: 09/03/22  Pt will be able to identify/verbalize steps and/or items needed to complete simple functional task (simple snack prep, laundry task, etc) with min cues. Baseline: Goal status: IN PROGRESS  2.  Patient will be able to maintain selective attention for 2 mins during structured task without additional cues for attention.  Baseline:  Goal status: MET - 08/31/22  3.  Pt will complete table top scanning activity with min cues for technique Baseline:  Goal status: MET - 08/31/22  5.  Pt will be able to utilize phone to locate contacts and make emergency calls as needed without cues. Baseline:  Goal status: IN PROGRESS   LONG TERM GOALS: Target date: 09/24/22  Pt will demonstrate ability to sequence simple functional task (simple snack prep, laundry task, etc) at Mod I level. Baseline:  Goal status: IN PROGRESS  2.  Pt will navigate a moderately busy environment, following multi-step commands with 90% accuracy to simulate return to community reintegration. Baseline:  Goal status: IN PROGRESS  3.  Pt will be able to utilize phone to look up phone number for physician and/or other frequently used number/location without cues. Baseline:  Goal status: IN PROGRESS  4.  Pt will be able to verbalize wayfinding/directions from home to familiar locations in the area without cues. Baseline:  Goal status: IN PROGRESS  5.  Pt will complete pill box assessment with <5 errors in 5 min time limit to demonstrate increased attention to  task. Baseline:  Goal status: IN PROGRESS  6.  Pt will complete MOCA and demonstrate improved cognitive function on MOCA by 3 points. Baseline: TBD Goal status: REVISED  - D/C 09/13/22  ASSESSMENT:  CLINICAL IMPRESSION: Pt with decreased initiation and/or motivation when participating in simple and complex planning activities, looking to spouse or therapist for answers frequently. Pt with improved sustained attention to task in quite treatment room vs in moderately distracting gym space.  Pt with inability to select attention this session during dual tasking activity.  Pt also with difficulty navigating items on phone, frequently returning to messaging or email apps.  PERFORMANCE DEFICITS: in functional skills including IADLs, coordination, Fine motor control, Gross motor control, body mechanics, endurance, decreased knowledge of precautions, decreased knowledge of use of DME, and UE functional use, cognitive skills including attention, energy/drive, memory, orientation, problem solving, safety awareness, sequencing, and temperament/personality, and psychosocial skills including coping strategies, habits, and routines and behaviors.   IMPAIRMENTS: are limiting patient from IADLs and leisure.   CO-MORBIDITIES: may have co-morbidities  that affects occupational performance. Patient will benefit from skilled OT to address above impairments and improve overall function.  MODIFICATION OR ASSISTANCE TO COMPLETE EVALUATION: Min-Moderate modification of tasks or assist with assess necessary to complete an evaluation.  OT OCCUPATIONAL PROFILE AND HISTORY: Detailed assessment: Review of records and additional review of physical, cognitive, psychosocial history related to current functional performance.  CLINICAL DECISION MAKING: Moderate - several treatment options, min-mod task modification necessary  REHAB POTENTIAL: Good  EVALUATION COMPLEXITY: Moderate    PLAN:  OT FREQUENCY:  1-2x/week  OT DURATION: 6 weeks  PLANNED INTERVENTIONS: self care/ADL training, therapeutic exercise, therapeutic activity, balance training, functional mobility training, patient/family education, cognitive remediation/compensation, psychosocial skills training, energy conservation, coping strategies training, and DME and/or AE instructions  RECOMMENDED OTHER SERVICES: NA  CONSULTED AND AGREED WITH PLAN OF CARE: Patient and family member/caregiver  PLAN FOR NEXT SESSION: focused and selective attention - functional tasks with providing directions, making phone calls, preparing simple meal.  Complex/simple organization/planning   Coy Vandoren, [place], OTR/L 09/15/2022, 1:24 PM

## 2022-09-15 NOTE — Telephone Encounter (Signed)
   Pre-operative Risk Assessment    Patient Name: Douglas Cox  DOB: 10/02/1950 MRN: [id number]      Request for Surgical Clearance    Procedure:   Single implant placement @ #30   Date of Surgery:  Clearance 10/18/22                                 Surgeon:  Perlie Gold, DDS Surgeon's Group or Practice Name:  [facility] Phone number:  [phone] Fax number:  [phone]  email: [email or web address]    Type of Clearance Requested:   - Pharmacy:  Hold Apixaban (Eliquis)     Type of Anesthesia:   moderate sedation   Additional requests/questions:   90 days after stroke; need specific clearance since he had a stroke in December 2023.  Toma Deiters   09/15/2022, 9:55 AM

## 2022-09-16 ENCOUNTER — Telehealth: Payer: Self-pay

## 2022-09-16 DIAGNOSIS — I6381 Other cerebral infarction due to occlusion or stenosis of small artery: Secondary | ICD-10-CM

## 2022-09-16 NOTE — Telephone Encounter (Signed)
Patient with diagnosis of A Fib on Eliquis for anticoagulation.  Stroke on 07/17/22.  Procedure: Single implant placement @ #30    Date of procedure: 10/18/22   CHA2DS2-VASc Score = 4  This indicates a 4.8% annual risk of stroke. The patient's score is based upon: CHF History: 0 HTN History: 1 Diabetes History: 0 Stroke History: 2 Vascular Disease History: 0 Age Score: 1 Gender Score: 0    CrCl 58 mL/min Platelet count 326K  Patient does not require pre-op antibiotics for dental procedure.  Due to stroke ~ 3 months prior to planned procedure, will verify with Dr Quentin Ore if patient requires bridging.  **This guidance is not considered finalized until pre-operative APP has relayed final recommendations.**

## 2022-09-16 NOTE — Telephone Encounter (Signed)
Patient requesting physical therapy for same issue, wondering if you can put the order in for him again because he is developing those issues again.

## 2022-09-17 DIAGNOSIS — Z9189 Other specified personal risk factors, not elsewhere classified: Secondary | ICD-10-CM | POA: Insufficient documentation

## 2022-09-17 NOTE — Telephone Encounter (Signed)
Douglas Cox,   Douglas Cox called about getting  PT. He still is seeing you. He had an eval on 08/10/22 and declined PT.  I think he probably would benefit from PT. I assume you are on board as well?  Thanks! ZTS

## 2022-09-20 ENCOUNTER — Ambulatory Visit: Payer: Medicare HMO

## 2022-09-20 ENCOUNTER — Ambulatory Visit: Payer: Medicare HMO | Admitting: Occupational Therapy

## 2022-09-20 DIAGNOSIS — R41840 Attention and concentration deficit: Secondary | ICD-10-CM

## 2022-09-20 DIAGNOSIS — I69318 Other symptoms and signs involving cognitive functions following cerebral infarction: Secondary | ICD-10-CM

## 2022-09-20 DIAGNOSIS — R41841 Cognitive communication deficit: Secondary | ICD-10-CM

## 2022-09-20 DIAGNOSIS — M6281 Muscle weakness (generalized): Secondary | ICD-10-CM

## 2022-09-20 NOTE — Therapy (Signed)
OUTPATIENT OCCUPATIONAL THERAPY  Treatment Note  Patient Name: Douglas Cox MRN: [id number] DOB:[birth year], 72 y.o., male Today's Date: 09/20/2022  PCP: Dr. Zara Council ([facility]) REFERRING PROVIDER: Charlett Blake, MD  END OF SESSION:  OT End of Session - 09/20/22 1323     Visit Number 9    Number of Visits 15    Date for OT Re-Evaluation 10/08/22    Authorization Type [employer]    OT Start Time 1323   arrival time   OT Stop Time 1403    OT Time Calculation (min) 40 min    Activity Tolerance Patient tolerated treatment well    Behavior During Therapy Flat affect;Restless                     Past Medical History:  Diagnosis Date   Celiac disease    Retinal detachment    Past Surgical History:  Procedure Laterality Date   CATARACT EXTRACTION Bilateral    Dr. Valetta Close   EYE SURGERY     FRACTURE SURGERY     GAS INSERTION Right 05/21/2021   Procedure: INSERTION OF GAS - C3F8;  Surgeon: Bernarda Caffey, MD;  Location: [place];  Service: Ophthalmology;  Laterality: Right;   GAS/FLUID EXCHANGE Right 05/21/2021   Procedure: GAS/FLUID EXCHANGE;  Surgeon: Bernarda Caffey, MD;  Location: [place];  Service: Ophthalmology;  Laterality: Right;   HERNIA REPAIR     IR CT HEAD LTD  07/17/2022   IR PERCUTANEOUS ART THROMBECTOMY/INFUSION INTRACRANIAL INC DIAG ANGIO  07/17/2022   IR US GUIDE VASC ACCESS RIGHT  07/17/2022   PARS PLANA VITRECTOMY Right 05/21/2021   Procedure: PARS PLANA VITRECTOMY WITH 25 GAUGE;  Surgeon: Bernarda Caffey, MD;  Location: [place];  Service: Ophthalmology;  Laterality: Right;   PERFLUORONE INJECTION Right 05/21/2021   Procedure: PERFLUORONE INJECTION;  Surgeon: Bernarda Caffey, MD;  Location: [place];  Service: Ophthalmology;  Laterality: Right;   PHOTOCOAGULATION WITH LASER Right 05/21/2021   Procedure: PHOTOCOAGULATION WITH LASER;  Surgeon: Bernarda Caffey, MD;  Location: [place];  Service: Ophthalmology;  Laterality: Right;   RADIOLOGY WITH ANESTHESIA  N/A 07/17/2022   Procedure: IR WITH ANESTHESIA;  Surgeon: Radiologist, Medication, MD;  Location: [place];  Service: Radiology;  Laterality: N/A;   RETINAL DETACHMENT SURGERY     Patient Active Problem List   Diagnosis Date Noted   Paroxysmal atrial fibrillation ([place]) 09/01/2022   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 ([place]) 07/22/2022   Protein-calorie malnutrition, severe 07/19/2022   Stroke (cerebrum) ([place]) 07/17/2022   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   Celiac disease    Alcohol dependence ([place])    Bradycardia     ONSET DATE: 08/04/22 (referral date)  REFERRING DIAG: I63.81 (ICD-10-CM) - 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 Pt accompanied by: self and significant other (spouse)  PERTINENT HISTORY: 72 y.o. male with a PMHx of cataracts, celiac disease and retinal detachment who presents to the ED with acute onset of left hemiplegia, right gaze deviation and left hemineglect.MRI: Acute/subacute nonhemorrhagic infarct of the right caudate head and lentiform nucleus; acute/subacute nonhemorrhagic infarct of the left caudate head and lentiform nucleus spanning the internal capsule; Acute/subacute  nonhemorrhagic infarct of the cortex of the right temporal tip.  PRECAUTIONS: Fall  WEIGHT BEARING RESTRICTIONS: No  PAIN:  Are you having pain? No  FALLS: Has patient fallen in last 6 months? Yes. Number of falls 2 - 1 when he had the stroke he fell forward and hit his head and fell off a ladder  LIVING ENVIRONMENT: Lives with: lives with their spouse Lives in: House/apartment Stairs: Yes: Internal: 16  steps; on right going up and External: 4 steps to front door, 16 steps from garage to main floor of home steps; on right going up Has following equipment at home: Single point cane  PLOF: Independent, working as an Pension scheme manager  PATIENT GOALS: to improve thought process, speech  OBJECTIVE:   HAND DOMINANCE: Right  ADLs: Overall ADLs: Independent with all bathing/dressing.    IADLs: Shopping: Not interested in going to the store, but would have gone prior to CVA Light housekeeping: is able to assist with household tasks when spouse asks for assistance or directs him to complete Meal Prep: Spouse is proving supervision for meal prep, but has been able to cook fish Community mobility: MD has not cleared pt to drive Medication management: Spouse administers medication, is having difficulty with administering B12 shot Financial management: Spouse is currently paying bills, but pt would have completed prior to CVA   COORDINATION: Finger Nose Finger test: mild ataxia, dysmetria LUE 9 Hole Peg test: Right: 32.37 sec; Left: 50.04 sec  Noted some difficulty with sequencing, requiring cues for recall and sequencing of directions Box and Blocks:  Right 38 blocks, Left 40 blocks (Pt frequently picking up 2-3 blocks at a time despite mod-max cues to pick up only one block at a time.    COGNITION: Overall cognitive status: Impaired Orientation Level: Person;Place;Situation Person: Oriented Place: Disoriented Situation: Disoriented Memory: Impaired Memory Impairment: Storage deficit;Retrieval deficit;Decreased recall of new information;Decreased short term memory Attention: Focused Awareness: Impaired Awareness Impairment: Intellectual impairment Problem Solving: Impaired Problem Solving Impairment: Verbal basic;Functional basic Behaviors: Restless;Impulsive Safety/Judgment: Impaired  OBSERVATIONS: Pt demonstrating attention at focused level during 9 hole peg test, requiring frequent cues and  redirection to attend to 2 step task with Box and Blocks.  Pt demonstrating decreased interest and motivation during OT evaluation, however pt and spouse agreeable to continue with OT at this time to focus on memory and return to engagement in IADLs and leisure pursuits.  TODAY'S TREATMENT:                                  09/20/22 Attention: engaged in card game (speed) with focus on selective attention and speed.  Pt requiring increased time with sequencing and min cues for attention to task with cues for problem solving when stuck.  Pt demonstrating min increased ease with sequencing and speed during second bout through card game.  Discussed ability to upgrade/downgrade aspects of game to meet attention level. Phone use: OT challenging pt to locate phone number of significant other to call.  Pt initially opening incorrect apps x2, but then able to locate SO in both text messages and contacts with significantly increased time.  Reviewed multiple avenues to locate contacts in phone.  Pt easily distracted by other apps, requiring increased time to recognize in incorrect app and attempt to correct.     09/15/22 Attention: pt's spouse noting that pt is more distractible, especially in regards to cell phone and when TV is  on. Discussed volume of alerts on cell phone causing break in focused or sustained attention during task as pt still with decreased selective and alternating attention.  Selective/alternating attention: engaged in ambulatory task incorporating naming items while tapping cones with feet.  Pt with decreased ability to maintain selective attention to task in moderately distracting gym space.  OT challenging pt to increase pace with toe tapping and naming items, however pt unable to complete both simultaneously.  Pt with increased success with naming task when in quiet room vs in moderately distracting gym space. Phone use: OT provided prompt for pt to call to cancel an appt.  Pt initially unable  to recall clinic name, however once provided with name pt attempting to search in messages app.  Pt requiring mod cues to back out of messaging app, then opening email app.  OT providing mod verbal cues to close out all apps and locate internet browser on phone.  Pt able to open internet browser but unable to search for clinic site due to time constraints.    09/13/22 Meal prep: Pt's spouse reports that pt has cooked fish a few times over the past few weeks without any issue.  Spouse notes that he does not seem motivated to prepare breakfast as he would have prior to stroke.  Pt's spouse reports no safety concerns with use of knives or microwave/toaster, however has left the stove top burner on a few times. Apple/smart watch: discussed various options of smart watches to provide additional safety features with HR monitoring for Afib and ability to contact EMS with falls.  Discussed concern with ease of use due to decreased initiation and memory. Complex planning: engaged in simulated complex planning activities such as: purchasing flowers, planning a move, and planning a trip.  OT incorporated handwriting with pt making a list of tasks with each activity.  Pt's handwriting started out at moderate size, quickly decreased with severe micrographia.  Pt requiring mod cues for sequencing and completion of each task.  Pt unable to complete simulate planning to move, therefore transitioned to planning for a fishing trip.  Pt continuing to demonstrate micragraphia with decreased receptiveness to use of lines for increased sizing of letters.  OT encouraged pt and spouse to complete simulated meal planning to complete simple planning as complex still quite challenging.   PATIENT EDUCATION: Education details: Educated on Estate agent during daily tasks Person educated: Patient and Spouse Education method: Explanation Education comprehension: verbalized understanding and needs further  education  HOME EXERCISE PROGRAM: TBD   GOALS: Goals reviewed with patient? Yes  SHORT TERM GOALS: Target date: 09/03/22  Pt will be able to identify/verbalize steps and/or items needed to complete simple functional task (simple snack prep, laundry task, etc) with min cues. Baseline: Goal status: IN PROGRESS  2.  Patient will be able to maintain selective attention for 2 mins during structured task without additional cues for attention.  Baseline:  Goal status: MET - 08/31/22  3.  Pt will complete table top scanning activity with min cues for technique Baseline:  Goal status: MET - 08/31/22  5.  Pt will be able to utilize phone to locate contacts and make emergency calls as needed without cues. Baseline:  Goal status: IN PROGRESS   LONG TERM GOALS: Target date: 09/24/22  Pt will demonstrate ability to sequence simple functional task (simple snack prep, laundry task, etc) at Mod I level. Baseline:  Goal status: IN PROGRESS  2.  Pt will navigate a moderately busy environment,  following multi-step commands with 90% accuracy to simulate return to community reintegration. Baseline:  Goal status: IN PROGRESS  3.  Pt will be able to utilize phone to look up phone number for physician and/or other frequently used number/location without cues. Baseline:  Goal status: IN PROGRESS  4.  Pt will be able to verbalize wayfinding/directions from home to familiar locations in the area without cues. Baseline:  Goal status: IN PROGRESS  5.  Pt will complete pill box assessment with <5 errors in 5 min time limit to demonstrate increased attention to task. Baseline:  Goal status: IN PROGRESS  6.  Pt will complete MOCA and demonstrate improved cognitive function on MOCA by 3 points. Baseline: TBD Goal status: REVISED  - D/C 09/13/22  ASSESSMENT:  CLINICAL IMPRESSION: Pt demonstrating slow initiation and sequencing during card game (speed) with focus on increased sequencing and  initiation.  Pt's spouse receptive to education on strategies to upgrade/downgrade attention challenge during card game.  Pt continues to demonstrate difficulty breaking sustained or focused attention when activating use of phone to locate specific phone numbers/contacts, as pt easily distracted by frequently used apps.  Pt with difficulty selecting appropriate soft keys on phone as well, with decreased frustration tolerance.  PERFORMANCE DEFICITS: in functional skills including IADLs, coordination, Fine motor control, Gross motor control, body mechanics, endurance, decreased knowledge of precautions, decreased knowledge of use of DME, and UE functional use, cognitive skills including attention, energy/drive, memory, orientation, problem solving, safety awareness, sequencing, and temperament/personality, and psychosocial skills including coping strategies, habits, and routines and behaviors.   IMPAIRMENTS: are limiting patient from IADLs and leisure.   CO-MORBIDITIES: may have co-morbidities  that affects occupational performance. Patient will benefit from skilled OT to address above impairments and improve overall function.  MODIFICATION OR ASSISTANCE TO COMPLETE EVALUATION: Min-Moderate modification of tasks or assist with assess necessary to complete an evaluation.  OT OCCUPATIONAL PROFILE AND HISTORY: Detailed assessment: Review of records and additional review of physical, cognitive, psychosocial history related to current functional performance.  CLINICAL DECISION MAKING: Moderate - several treatment options, min-mod task modification necessary  REHAB POTENTIAL: Good  EVALUATION COMPLEXITY: Moderate    PLAN:  OT FREQUENCY: 1-2x/week  OT DURATION: 6 weeks  PLANNED INTERVENTIONS: self care/ADL training, therapeutic exercise, therapeutic activity, balance training, functional mobility training, patient/family education, cognitive remediation/compensation, psychosocial skills training,  energy conservation, coping strategies training, and DME and/or AE instructions  RECOMMENDED OTHER SERVICES: NA  CONSULTED AND AGREED WITH PLAN OF CARE: Patient and family member/caregiver  PLAN FOR NEXT SESSION: focused and selective attention - functional tasks with providing directions, making phone calls, preparing simple meal.  Complex/simple organization/planning   Rocko Fesperman, [place], OTR/L 09/20/2022, 1:26 PM

## 2022-09-20 NOTE — Telephone Encounter (Signed)
RX sent for PT at [facility]

## 2022-09-20 NOTE — Telephone Encounter (Signed)
Nothing further needed . 

## 2022-09-20 NOTE — Therapy (Signed)
OUTPATIENT SPEECH LANGUAGE PATHOLOGY TREATMENT   Patient Name: Douglas Cox MRN: [id number] DOB:[birth year], 72 y.o., male Today's Date: 09/20/2022  PCP: None Recorded REFERRING PROVIDER: Alysia Penna, Cox  END OF SESSION:  End of Session - 09/20/22 1408     Visit Number 12    Number of Visits 25    Date for SLP Re-Evaluation 11/09/22    SLP Start Time 1405    SLP Stop Time  1445    SLP Time Calculation (min) 40 min    Activity Tolerance Patient tolerated treatment well                    Past Medical History:  Diagnosis Date   Celiac disease    Retinal detachment    Past Surgical History:  Procedure Laterality Date   CATARACT EXTRACTION Bilateral    Douglas Cox   EYE SURGERY     FRACTURE SURGERY     GAS INSERTION Right 05/21/2021   Procedure: INSERTION OF GAS - C3F8;  Surgeon: Douglas Caffey, Cox;  Location: [place];  Service: Ophthalmology;  Laterality: Right;   GAS/FLUID EXCHANGE Right 05/21/2021   Procedure: GAS/FLUID EXCHANGE;  Surgeon: Douglas Caffey, Cox;  Location: [place];  Service: Ophthalmology;  Laterality: Right;   HERNIA REPAIR     IR CT HEAD LTD  07/17/2022   IR PERCUTANEOUS ART THROMBECTOMY/INFUSION INTRACRANIAL INC DIAG ANGIO  07/17/2022   IR US GUIDE VASC ACCESS RIGHT  07/17/2022   PARS PLANA VITRECTOMY Right 05/21/2021   Procedure: PARS PLANA VITRECTOMY WITH 25 GAUGE;  Surgeon: Douglas Caffey, Cox;  Location: [place];  Service: Ophthalmology;  Laterality: Right;   PERFLUORONE INJECTION Right 05/21/2021   Procedure: PERFLUORONE INJECTION;  Surgeon: Douglas Caffey, Cox;  Location: [place];  Service: Ophthalmology;  Laterality: Right;   PHOTOCOAGULATION WITH LASER Right 05/21/2021   Procedure: PHOTOCOAGULATION WITH LASER;  Surgeon: Douglas Caffey, Cox;  Location: [place];  Service: Ophthalmology;  Laterality: Right;   RADIOLOGY WITH ANESTHESIA N/A 07/17/2022   Procedure: IR WITH ANESTHESIA;  Surgeon: Douglas Cox;  Location: [place];   Service: Radiology;  Laterality: N/A;   RETINAL DETACHMENT SURGERY     Patient Active Problem List   Diagnosis Date Noted   Paroxysmal atrial fibrillation ([place]) 09/01/2022   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 ([place]) 07/22/2022   Protein-calorie malnutrition, severe 07/19/2022   Stroke (cerebrum) ([place]) 07/17/2022   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   Celiac disease    Alcohol dependence ([place])    Bradycardia      ONSET DATE: 07/17/22   REFERRING DIAG: I63.81 (ICD-10-CM) - 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  TNK given and recanalization acheived with thrombectomy.  Later at 1800 noted right sided weakness and new CT Head and CTA head and neck reordered and negative for reocclusion. MRI today shows bilateral basal ganglia infarcts. ASA 72m started 12/17  Pt with ETT for thrombectomy and overnight.  Extubated 12/17    PAIN:  Are you having pain? No   PATIENT GOALS: Pt did not answer question when asked, due to attention/processing deficits  OBJECTIVE:   DIAGNOSTIC FINDINGS:  FEES 07/30/22 Pt demonstrates a moderate oropharyngeal dysphagia that is impacted by patient's current cognitive deficits. Patient's oral phase is characterized by poor awareness of bolus resulting in inconsistent timing of swallowing trigger. With solids, patient needed verbal cues (snapping and counting) to attend and initiate a swallow with all boluses contained in the velleculae. With liquids, patient consistently triggered his swallow at the pyriform sinuses. This resulted in sensed  aspiration of thin liquids via cup. Patient also with inconsistent amounts of pharyngeal residue that cleared with a 2nd subsequent swallow. No penetration or aspiration noted with nectar-thick liquids except for 1 episode of flash penetration. Provided thorough education to the patient's wife regarding patient's current swallowing function and that he is currently at a higher risk of aspiration due to cognitive deficits. She verbalized understanding and agreement with recommendations of continuing Dys. 3 textures but upgrading to nectar-thick liquids via cup with strict aspiration precautions and supervision. This will hopefully improve patient's intake of liquids with improved ability to stay hydrated without IV fluids prior to potential discharge on 08/03/22. SLP Diet Recommendations Dysphagia 3 (Mech soft) solids;Nectar thick liquid Liquid Administration via Cup;Spoon;No straw Medication Administration Crushed with puree Compensations: Slow rate;Small sips/bites;Minimize environmental distractions;Multiple dry swallows after each bite/sip  Today, pt ate dys III and drank nectar liqiuds. See "clinicial impressions" for results.  STANDARDIZED ASSESSMENTS: CLQT: may need to be completed in first 1-3 sessions  PATIENT REPORTED OUTCOME MEASURES (PROM): Cognitive function: Short Form: to be administered in first 1-3 sessions   TODAY'S TREATMENT:                                                                                                                                         DATE:  09/20/22:  Pt needs CLQT and PROM. Pt upset about not finishing homework. Douglas Cox entered today after OT [place] affect. Attention targeted today in conversation. Pt held topic with WNL topic maintenance for 12 minutes, then 11 minutes. Pt is tracking his own meds correctly at this time. Does not have a pill organizer, just uses bottles. Pt cooking with wife at home - smoke alarm went off due to pt not turning stove down from  searing fish. Wife stated smoke alarm is "sensitive", however SLP questions if pt should be cooking only with wife present in kitchen.    No coughing with meals/liquids. Taking meds whole with liquids now.   09/15/22: SLP targeted attention and verbal expression today in a functional task. Pt was able to sustain attention for 10 minutes in two conversational tasks. 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 Pt recalled he won bet from Monday and SLP owes him 25 cents.  09/13/22: Pt needs cognitive PROM, CLQT. Consider [place] Naming Test. Pt and wife stated pt coughed with corn chips last night so SLP assessed pt with  peanut butter crackers today without any overt s/sx aspiration or any oral stage difficulty, nor any other pharyngeal stage deficits. Pt deemed/appears safe with regular food items. Wife stated pt ate with bean dip but complained of pharyngeal stasis after coughing. SLP postulates if this was an anomalous incident as pt has not reported ANY coughing (nor wife endorsing any coughing) with POs.  Today SLP measured pt's loud /a/ with "hey" and with loud /a/ - variable loudness responses (never >73dB) between 62-73 dB. Loud /a/ averaged in upper 60s dB with usual mod A for loudness. Pt tearful about his singing voice; initially pt's voice very tremulous and this subsided over time today. Pt told to practice 10 loud /a/ BID.  Pt recalled 3/7 details about this weekend's events. Wife with excellent cues for pt's memory. Today, pt initiated sentences with 3-4 words and then he paused for 5-8 seconds of silence prior to completing sentence; 20% of the time pt abandoned sentence/utterance.  09/08/22: Pt was doing bills this morning and called about a incr in their mortgage amount, independently. SLP asked pt to route [facility] and pt able to do so (and noted SLP error) without 2/4 road names (aware of one  error). Pt recalled 3/5 items from today's schedule prior to ST. He did not think or attempt to write down 3 medication names. He "bet" SLP he would remember to bring his vitamin D next session. Held attention for 8 minutes prior to observation of loss of attention. At times pt stops verbal expression as if he might be experiencing anomia.  SLP to consider administering [place] (BNT-2).   09/06/22: Pt was able to hold 7-8 minute conversation with SLP today in therapy, x3. Speech volume not as loud as Friday 09/03/22, and pt's processing appeared slower today in general than on Friday 09/03/22. Pt had busy weekend and made schedule for the day with [facility] assistance. 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 Pt to cont to follow schedule to encourage brain stimulating tasks at home.  CONSIDER ADMINISTRATION OF CLQT for obtaining a baseline of pt's cognitive linguistics.  09/03/22: Pt recalled 3 iof his questions and the answers from Douglas Cox. Naaman Plummer appointment spontaneously. SLP and wife provided the other two with min-mod A and total A. Pt recalled one answer from these two spontaneously. Pt held attention for 10 minutes in conversation about this appointment and about exercise.   08/31/22: Pt and wife deny cont'd overt s/sx aspiration with meals. Pt still does not take meds with water due to "They are bitter." Douglas Cox said pt has Douglas Cox. Naaman Plummer tomorrow so SLP had pt write down questions he has for Douglas Cox. Naaman Plummer. He thought of 2 spontaneously (5 with mod-max cues), and req'd consistent extra time to think of questions, mod A consistently for legible writing (decr'd emergent awareness), mod A usually for writing question in a format he would recall tomorrow (decr'd anticipatory awareness). Douglas Cox remarked that often she is having to encourage pt to get up from the chair to do things during the day. SLP strongly  suggested a schedule and provided example of this, and also reiterated that pt will have decr'd ability to initiate tasks at this time and may require wife's cues to do daily tasks. SLP provided example of SLP calling pt's name in waiting room and him not arising until  wife arises. Pt showing some incr'd awareness asking SLP if he will be able to return to things he was doing pre-CVA. 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  1/25:24: Wife asked if pt was safe for thin liquids. SLP told pt/wife that if pt took single sips and was not exhibiting coughing he could have thin liquids. SLP to assess next session. In a quiet environment, pt completed written math task for 9 minutes prior to requiring 5 minute break, evidenced by difficulty with/inability in adding simple figures. Pt impulsive about starting back to task again and req'd SLP cueing for longer break. After 6 minutes, pt said, "Get back to this again," and re-focused on written math task- processing speed improved for 3 more entries until pt had to leave. SLP educated/highlighted to pt and wife the importance of taking breaks when he feels "stuck".   08/24/22: speech/cognition: Pt demonstrated sustained attention for 7 minutes today for therapy task of writing down reasons for each med. He demonstrated attention adequate to recall 3/5 reasons 2 minutes later, when given extra time, and required mod A with other two. He continues to perform attention lengthening tasks at home. Wife has downloaded [employer] to her phone and has directed pt to complete. Pt rec'd cont'd rationale today for his meds - he "does not like taking pills." Pt inquired reason for CVA today in session and was told by wife and SLP. Today, pt's reasoning questionable with rationale to not take med/s with pureed but pt indicated he did not enjoy the smoothies wife was making for him with meds in them.  Swallowing: SLP  discussed ways to assist pt in taking meds as they are currently being crushed and put into a smoothie for pt; "Do I have to have the smoothies" pt inquired. SLP suggested meds with applesauce, and yogurt, one pureed item at a time, and pt declined each time. SLP, pt, and wife eventually agreed pt may attempt one med with pureed item until next session.   PATIENT EDUCATION: Education details: see "today's treatment" Person educated: Patient and Spouse Education method: Explanation and Demonstration Education comprehension: verbalized understanding and needs further education   GOALS: Goals reviewed with patient? No  SHORT TERM GOALS: Target date: 2/9/2, 09/27/22 (revised 2/12/224)  Pt will follow aspiration precautions from FEES with occasional min A in 2 sessions Baseline: Goal status: deferred - pt not coughing with POs  2.  [place] will demo sustained attention to participate in 10 minute therapy task x3/session, in 3 sessions Baseline: 09/03/22, 09-20-22 Goal status: ongoing  3.  Pt will demo understanding of written cues in simple therapy tasks, with initial cue allowed, in 3 sessions Baseline: 08-19-22, 09-03-22 Goal status: Met  4.  Pt will demo selective attention for 10 minutes to a simple therapy task in a min-mod noisy environment Baseline:  Goal status: Ongoing  5.  Pt/wife will tell SLP 3 overt s/sx aspiration PNA with modified independence Baseline:  Goal status: Deferred - pt  no longer couhging with POs  6   Using compensations, pt will ask wife to give him his medications, at least once/day between 3 sessions Baseline:  Goal status: Met   LONG TERM GOALS: Target date: 11/09/22  Pt will follow aspiration precautions from FEES with rare min A in 3 sessions Baseline:  Goal status: Deferred   2.  Pt will follow aspiration precautions from FEES with modified independence in  2 sessions Baseline:  Goal status: deferred  3.  Pt will demo selective attention for 20  minutes to a simple therapy task in a min-mod noisy environment Baseline:  Goal status: Ongoing  4.  Pt will demo emergent awareness in simple therapy tasks with verbal cue for error awareness in 3 sessions Baseline:  Goal status: Ongoing  5.  Pt will demo alternating attention for 20 minutes to complete simple-mod complex therapy tasks 100% accuracy in 3 sessions Baseline:  Goal status: Ongoing  6.  Using compensations, pt will recall to administer his own medications correctly, at least once/day between 3 sessions Baseline:  Goal status: Ongoing  7.  Wife's PROM (due to pt's attention deficit) score for pt will improve in last 1-2 sessions than the score taken in the first 1-2 sessions Baseline:  Goal status: Ongoing  ASSESSMENT:  CLINICAL IMPRESSION: Patient is a 72 y.o. male who was seen today for treatment of simple cognition (primarily attention) in light of CVA 07-17-22. Sustained attention maintained for >10 minutes in conversation today's session. Pt swallowing is WNL, is now tracking his own meds correctly. .   OBJECTIVE IMPAIRMENTS: include attention, memory, awareness, executive functioning, receptive language, and dysphagia. These impairments are limiting patient from return to work, managing medications, managing appointments, managing finances, household responsibilities, ADLs/IADLs, effectively communicating at home and in community, and safety when swallowing. Factors affecting potential to achieve goals and functional outcome are ability to learn/carryover information, cooperation/participation level, and severity of impairments.. Patient will benefit from skilled SLP services to address above impairments and improve overall function.  REHAB POTENTIAL: Good  PLAN:  SLP FREQUENCY: 2x/week  SLP DURATION: 12 weeks  PLANNED INTERVENTIONS: Aspiration precaution training, Pharyngeal strengthening exercises, Diet toleration management , Environmental controls, Trials of  upgraded texture/liquids, Cueing hierachy, Cognitive reorganization, Internal/external aids, Multimodal communication approach, SLP instruction and feedback, Compensatory strategies, and Patient/family education    [facility], [place] 09/20/2022, 2:14 PM

## 2022-09-21 NOTE — Telephone Encounter (Signed)
Generally recommend continuing anticoagulation for dental work on single tooth. Given recent stroke, would see if dentist office is ok with pt continuing on Eliquis for his dental work since he is at high risk to stop his anticoagulation.

## 2022-09-21 NOTE — Telephone Encounter (Addendum)
Not sure how feasible a bridge really is for this procedure. It should be a low bleed risk procedure that ideally would be done on anticoagulation.   If he did hold his Eliquis, he would just hold the dose the PM before which is too short of a duration for him to be bridged with Lovenox (when patients are bridged, their last Lovenox dose is 24 hours before their procedure to allow for washout, and are used when > 24 hour anticoagulation holds are required, which this procedure shouldn't need. Eliquis also has similar washout time to Lovenox). Holding a dose would require MD approval given recent stroke. This procedure should ideally be able to be done on anticoagulation.

## 2022-09-21 NOTE — Telephone Encounter (Signed)
   Name: Douglas Cox  DOB: [birth year]  MRN: [id number]   Primary Cardiologist: Dr. Quentin Ore (EP)  Chart reviewed as part of pre-operative protocol coverage.  [name] was last seen on 09/10/2022 by Dr. Quentin Ore. Per Dr. Mardene Speak recommendations "After 3 months, I think it is reasonable for him to undergo a surgical procedure from a CV risk perspective. If holding anticoagulation is a requirement to undergo a planned procedure, recommend minimizing the interruption and consideration of bridging given his history of cardioembolic stroke."    Per Pharm D "Generally recommend continuing anticoagulation for dental work on single tooth. Given recent stroke, would see if dentist office is ok with pt continuing on Eliquis for his dental work since he is at high risk to stop his anticoagulation "  Therefore, based on ACC/AHA guidelines, the patient would be at acceptable risk for the planned procedure without further cardiovascular testing. Recommend remaining on Eliquis during perioperative period.   I will route this recommendation to the requesting party via Epic fax function and remove from pre-op pool. Please call with questions.  Mayra Reel, NP 09/21/2022, 1:45 PM

## 2022-09-22 ENCOUNTER — Other Ambulatory Visit (HOSPITAL_COMMUNITY): Payer: Self-pay

## 2022-09-22 ENCOUNTER — Ambulatory Visit: Payer: Medicare HMO | Admitting: Occupational Therapy

## 2022-09-22 ENCOUNTER — Ambulatory Visit: Payer: Medicare HMO

## 2022-09-22 DIAGNOSIS — I69318 Other symptoms and signs involving cognitive functions following cerebral infarction: Secondary | ICD-10-CM

## 2022-09-22 DIAGNOSIS — R41841 Cognitive communication deficit: Secondary | ICD-10-CM | POA: Diagnosis not present

## 2022-09-22 DIAGNOSIS — M6281 Muscle weakness (generalized): Secondary | ICD-10-CM

## 2022-09-22 DIAGNOSIS — R41840 Attention and concentration deficit: Secondary | ICD-10-CM

## 2022-09-22 MED ORDER — "BD LUER-LOK SYRINGE 25G X 1"" 3 ML MISC"
0 refills | Status: DC
  Filled 2022-09-22: qty 30, 30d supply, fill #0

## 2022-09-22 NOTE — Therapy (Signed)
OUTPATIENT OCCUPATIONAL THERAPY  Treatment Note  Patient Name: Teak Baka MRN: [id number] DOB:10-04-1950, 72 y.o., male Today's Date: 09/22/2022  PCP: Dr. Zara Council ([facility]) REFERRING PROVIDER: Charlett Blake, MD  Occupational Therapy Progress Note  Dates of Reporting Period: 08/11/22 to 09/22/22  Objective Reports of Subjective Statement: Pt is demonstrating improvements in initiation, sequencing, and attention during seated tasks.  Pt continues to demonstrate difficulty with dual tasking and selective attention with table top and gross motor tasks.  Pt has shown improvements in sequencing and awareness of errors with Pill box assessment.  Plan: Continue to address both table top and functional dual tasking, multi-step sequencing, and problem solving/awareness.  Reason Skilled Services are Required: cognitive impairment impacting IADLs, making of appointments, and engagement in leisure pursuits.   END OF SESSION:  OT End of Session - 09/22/22 1322     Visit Number 10    Number of Visits 23    Date for OT Re-Evaluation 11/05/22    Authorization Type [employer]    OT Start Time 1318    OT Stop Time 1400    OT Time Calculation (min) 42 min    Activity Tolerance Patient tolerated treatment well    Behavior During Therapy Flat affect;Restless                      Past Medical History:  Diagnosis Date   Celiac disease    Retinal detachment    Past Surgical History:  Procedure Laterality Date   CATARACT EXTRACTION Bilateral    Dr. Valetta Close   EYE SURGERY     FRACTURE SURGERY     GAS INSERTION Right 05/21/2021   Procedure: INSERTION OF GAS - C3F8;  Surgeon: Bernarda Caffey, MD;  Location: [place];  Service: Ophthalmology;  Laterality: Right;   GAS/FLUID EXCHANGE Right 05/21/2021   Procedure: GAS/FLUID EXCHANGE;  Surgeon: Bernarda Caffey, MD;  Location: [place];  Service: Ophthalmology;  Laterality: Right;   HERNIA REPAIR     IR CT HEAD LTD  07/17/2022    IR PERCUTANEOUS ART THROMBECTOMY/INFUSION INTRACRANIAL INC DIAG ANGIO  07/17/2022   IR US GUIDE VASC ACCESS RIGHT  07/17/2022   PARS PLANA VITRECTOMY Right 05/21/2021   Procedure: PARS PLANA VITRECTOMY WITH 25 GAUGE;  Surgeon: Bernarda Caffey, MD;  Location: [place];  Service: Ophthalmology;  Laterality: Right;   PERFLUORONE INJECTION Right 05/21/2021   Procedure: PERFLUORONE INJECTION;  Surgeon: Bernarda Caffey, MD;  Location: [place];  Service: Ophthalmology;  Laterality: Right;   PHOTOCOAGULATION WITH LASER Right 05/21/2021   Procedure: PHOTOCOAGULATION WITH LASER;  Surgeon: Bernarda Caffey, MD;  Location: [place];  Service: Ophthalmology;  Laterality: Right;   RADIOLOGY WITH ANESTHESIA N/A 07/17/2022   Procedure: IR WITH ANESTHESIA;  Surgeon: Radiologist, Medication, MD;  Location: [place];  Service: Radiology;  Laterality: N/A;   RETINAL DETACHMENT SURGERY     Patient Active Problem List   Diagnosis Date Noted   Paroxysmal atrial fibrillation ([place]) 09/01/2022   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 ([place]) 07/22/2022   Protein-calorie malnutrition, severe 07/19/2022   Stroke (cerebrum) ([place]) 07/17/2022   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   Celiac disease    Alcohol dependence ([place])    Bradycardia     ONSET DATE: 08/04/22 (referral date)  REFERRING DIAG: I63.81 (ICD-10-CM) - 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 Pt accompanied by: self and significant other (spouse)  PERTINENT HISTORY: 72 y.o. male with a PMHx of cataracts, celiac disease and retinal detachment  who presents to the ED with acute onset of left hemiplegia, right gaze deviation and left hemineglect.MRI: Acute/subacute nonhemorrhagic infarct of the right caudate head and lentiform nucleus; acute/subacute nonhemorrhagic infarct of the left caudate head and lentiform nucleus spanning the internal capsule; Acute/subacute nonhemorrhagic infarct of the cortex of the right temporal tip.  PRECAUTIONS: Fall  WEIGHT BEARING RESTRICTIONS: No  PAIN:  Are you having pain? No  FALLS: Has patient fallen in last 6 months? Yes. Number of falls 2 - 1 when he had the stroke he fell forward and hit his head and fell off a ladder  LIVING ENVIRONMENT: Lives with: lives with their spouse Lives in: House/apartment Stairs: Yes: Internal: 16 steps; on right going up and External: 4 steps to front door, 16 steps from garage to main floor of home steps; on right going up Has following equipment at home: Single point cane  PLOF: Independent, working as an Pension scheme manager  PATIENT GOALS: to improve thought process, speech  OBJECTIVE:   HAND DOMINANCE: Right  ADLs: Overall ADLs: Independent with all bathing/dressing.    IADLs: Shopping: Not interested in going to the store, but would have gone prior to CVA Light housekeeping: is able to assist with household tasks when spouse asks for assistance or directs him to complete Meal Prep: Spouse is proving supervision for meal prep, but has been able to cook fish Community mobility: MD has not cleared pt to drive Medication management: Spouse administers medication, is having difficulty with administering B12 shot Financial management: Spouse is currently paying bills, but pt would have completed prior to CVA   COORDINATION: Finger Nose Finger test: mild ataxia, dysmetria LUE 9 Hole Peg test: Right: 32.37 sec; Left: 50.04 sec  Noted some difficulty with sequencing, requiring cues for recall and sequencing of directions Box and Blocks:  Right 38 blocks, Left 40 blocks  (Pt frequently picking up 2-3 blocks at a time despite mod-max cues to pick up only one block at a time.    COGNITION: Overall cognitive status: Impaired Orientation Level: Person;Place;Situation Person: Oriented Place: Disoriented Situation: Disoriented Memory: Impaired Memory Impairment: Storage deficit;Retrieval deficit;Decreased recall of new information;Decreased short term memory Attention: Focused Awareness: Impaired Awareness Impairment: Intellectual impairment Problem Solving: Impaired Problem Solving Impairment: Verbal basic;Functional basic Behaviors: Restless;Impulsive Safety/Judgment: Impaired  OBSERVATIONS: Pt demonstrating attention at focused level during 9 hole peg test, requiring frequent cues and redirection to attend to 2 step task with Box and Blocks.  Pt demonstrating decreased interest and motivation during OT evaluation, however pt and spouse agreeable to continue with OT at this time to focus on memory and return to engagement in IADLs and leisure pursuits.  TODAY'S TREATMENT:                                  09/22/22 Pill box assessment: Completed in 5:14. At 5 min time limit pt had completed all except 4 while administering last prescription.  Of note, pt initially starting medication in vertical method instead of linear, requiring need to start over which took some  time, but demonstrated good awareness.  Pt demonstrating improved sequencing and following of prescription instructions compared to previous session.  Pt reports that he is taking his medications as prescribed, will occasionally ask spouse to confirm prior to taking.  Trail making B: completed per spouse request with focus on amount of cues and timing.  OT terminated task at 4:02 as pt with 2 errors and beginning to voice increased frustration.  Pt able to complete to 10J with one error unrecognized and one error corrected with no cues from pt or spouse.  OT educated on functional carryover of alternating  attention to meal prep with multiple items to cook and with playing guitar while signing.      09/20/22 Attention: engaged in card game (speed) with focus on selective attention and speed.  Pt requiring increased time with sequencing and min cues for attention to task with cues for problem solving when stuck.  Pt demonstrating min increased ease with sequencing and speed during second bout through card game.  Discussed ability to upgrade/downgrade aspects of game to meet attention level. Phone use: OT challenging pt to locate phone number of significant other to call.  Pt initially opening incorrect apps x2, but then able to locate SO in both text messages and contacts with significantly increased time.  Reviewed multiple avenues to locate contacts in phone.  Pt easily distracted by other apps, requiring increased time to recognize in incorrect app and attempt to correct.     09/15/22 Attention: pt's spouse noting that pt is more distractible, especially in regards to cell phone and when TV is on. Discussed volume of alerts on cell phone causing break in focused or sustained attention during task as pt still with decreased selective and alternating attention.  Selective/alternating attention: engaged in ambulatory task incorporating naming items while tapping cones with feet.  Pt with decreased ability to maintain selective attention to task in moderately distracting gym space.  OT challenging pt to increase pace with toe tapping and naming items, however pt unable to complete both simultaneously.  Pt with increased success with naming task when in quiet room vs in moderately distracting gym space. Phone use: OT provided prompt for pt to call to cancel an appt.  Pt initially unable to recall clinic name, however once provided with name pt attempting to search in messages app.  Pt requiring mod cues to back out of messaging app, then opening email app.  OT providing mod verbal cues to close out all apps and  locate internet browser on phone.  Pt able to open internet browser but unable to search for clinic site due to time constraints.  PATIENT EDUCATION: Education details: Educated on Estate agent during daily tasks Person educated: Patient and Spouse Education method: Explanation Education comprehension: verbalized understanding and needs further education  HOME EXERCISE PROGRAM: TBD   GOALS: Goals reviewed with patient? Yes  SHORT TERM GOALS: Target date: 09/03/22  Pt will be able to identify/verbalize steps and/or items needed to complete simple functional task (simple snack prep, laundry task, etc) with min cues. Baseline: Goal status: IN PROGRESS  2.  Patient will be able to maintain selective attention for 2 mins during structured task without additional cues for attention.  Baseline:  Goal status: MET - 08/31/22  3.  Pt will complete table top scanning activity with min cues for technique Baseline:  Goal status: MET - 08/31/22  5.  Pt will be able to utilize phone to locate contacts and make emergency calls  as needed without cues. Baseline:  Goal status: IN PROGRESS  NEW SHORT TERM GOALS: Target date: 10/15/22  Pt will be able to identify/verbalize steps and/or items needed to complete simple functional task (simple snack prep, laundry task, etc) with supervision. Baseline: Goal status: INITIAL  2.  Patient will be able to maintain alternating attention for 2 mins during structured task without additional cues for attention.  Baseline:  Goal status: INITIAL  3.  Pt will be able to utilize phone to locate contacts and make emergency calls as needed without cues in <90 seconds. Baseline:  Goal status: INITIAL   LONG TERM GOALS: Target date: 09/24/22  Pt will demonstrate ability to sequence simple functional task (simple snack prep, laundry task, etc) at Mod I level. Baseline:  Goal status: NOT MET  2.  Pt will navigate a moderately busy  environment, following multi-step commands with 90% accuracy to simulate return to community reintegration. Baseline:  Goal status: NOT MET  3.  Pt will be able to utilize phone to look up phone number for physician and/or other frequently used number/location without cues. Baseline:  Goal status: NOT MET  4.  Pt will be able to verbalize wayfinding/directions from home to familiar locations in the area without cues. Baseline:  Goal status: NOT MET  5.  Pt will complete pill box assessment with <5 errors in 5 min time limit to demonstrate increased attention to task. Baseline:  Goal status: MET - 09/22/22  6.  Pt will complete MOCA and demonstrate improved cognitive function on MOCA by 3 points. Baseline: TBD Goal status: REVISED  - D/C 09/13/22  NEW LONG TERM GOALS: Target date: 11/05/22  Pt will demonstrate and/or verbalize ability to sequence advanced meal prep task (to include having all meal items completed at same time) at Mod I level. Baseline:  Goal status: INITIAL  2.  Pt will navigate a moderately busy environment, following multi-step commands with 90% accuracy to simulate return to community reintegration. Baseline:  Goal status: INITIAL  3.  Pt will be able to utilize phone to look up phone number for physician and/or other frequently used number/location without cues in <90 seconds. Baseline:  Goal status: INITIAL  4.  Pt will be able to verbalize wayfinding/directions from home to familiar locations in the area without cues. Baseline:  Goal status: INITIAL  5.  Pt will demonstrate ability to maintain selective attention for 5 mins during structured task without additional cues for attention.   Baseline:  Goal status: INITIAL    ASSESSMENT:  CLINICAL IMPRESSION: Pt demonstrating ability to complete Pill box assessment with fewer errors and in increased time.  Pt able to recognize error and correct, requiring increased time to complete correctly.  Pt  demonstrating ability to sustain attention to task for 5 mins without additional cues for completion.  Pt demonstrating and verbalizing difficulty with selective and alternating attention to complete leisure task of playing guitar while singing and during structured Trail making B task.  Pt is demonstrating improved initiation and engagement in therapeutic tasks and improving awareness of errors with more structured/functional tasks.  PERFORMANCE DEFICITS: in functional skills including IADLs, coordination, Fine motor control, Gross motor control, body mechanics, endurance, decreased knowledge of precautions, decreased knowledge of use of DME, and UE functional use, cognitive skills including attention, energy/drive, memory, orientation, problem solving, safety awareness, sequencing, and temperament/personality, and psychosocial skills including coping strategies, habits, and routines and behaviors.   IMPAIRMENTS: are limiting patient from IADLs and leisure.  CO-MORBIDITIES: may have co-morbidities  that affects occupational performance. Patient will benefit from skilled OT to address above impairments and improve overall function.  MODIFICATION OR ASSISTANCE TO COMPLETE EVALUATION: Min-Moderate modification of tasks or assist with assess necessary to complete an evaluation.  OT OCCUPATIONAL PROFILE AND HISTORY: Detailed assessment: Review of records and additional review of physical, cognitive, psychosocial history related to current functional performance.  CLINICAL DECISION MAKING: Moderate - several treatment options, min-mod task modification necessary  REHAB POTENTIAL: Good  EVALUATION COMPLEXITY: Moderate    PLAN:  OT FREQUENCY: 1-2x/week  OT DURATION: 6 weeks  PLANNED INTERVENTIONS: self care/ADL training, therapeutic exercise, therapeutic activity, balance training, functional mobility training, patient/family education, cognitive remediation/compensation, psychosocial skills  training, energy conservation, coping strategies training, and DME and/or AE instructions  RECOMMENDED OTHER SERVICES: NA  CONSULTED AND AGREED WITH PLAN OF CARE: Patient and family member/caregiver  PLAN FOR NEXT SESSION: selective attention - functional tasks with providing directions, making phone calls, preparing simple meal.  Complex/simple organization/planning.  Dual tasking with playing guitar while singing, and/or mental math to complete scheduling items and ensuring meal prep in timely manner (all items completed around same time).   Simonne Come, OTR/L 09/22/2022, 4:44 PM

## 2022-09-22 NOTE — Therapy (Signed)
OUTPATIENT SPEECH LANGUAGE PATHOLOGY TREATMENT   Patient Name: Douglas Cox MRN: [id number] DOB:[birth year], 72 y.o., male Today's Date: 09/22/2022  PCP: None Recorded REFERRING PROVIDER: Alysia Penna, MD  END OF SESSION:  End of Session - 09/22/22 1318     Visit Number 13    Number of Visits 25    Date for SLP Re-Evaluation 11/09/22    SLP Start Time 1234    SLP Stop Time  1315    SLP Time Calculation (min) 41 min    Activity Tolerance Patient tolerated treatment well                     Past Medical History:  Diagnosis Date   Celiac disease    Retinal detachment    Past Surgical History:  Procedure Laterality Date   CATARACT EXTRACTION Bilateral    Dr. Valetta Close   EYE SURGERY     FRACTURE SURGERY     GAS INSERTION Right 05/21/2021   Procedure: INSERTION OF GAS - C3F8;  Surgeon: Bernarda Caffey, MD;  Location: [facility];  Service: Ophthalmology;  Laterality: Right;   GAS/FLUID EXCHANGE Right 05/21/2021   Procedure: GAS/FLUID EXCHANGE;  Surgeon: Bernarda Caffey, MD;  Location: [facility];  Service: Ophthalmology;  Laterality: Right;   HERNIA REPAIR     IR CT HEAD LTD  07/17/2022   IR PERCUTANEOUS ART THROMBECTOMY/INFUSION INTRACRANIAL INC DIAG ANGIO  07/17/2022   IR US GUIDE VASC ACCESS RIGHT  07/17/2022   PARS PLANA VITRECTOMY Right 05/21/2021   Procedure: PARS PLANA VITRECTOMY WITH 25 GAUGE;  Surgeon: Bernarda Caffey, MD;  Location: [facility];  Service: Ophthalmology;  Laterality: Right;   PERFLUORONE INJECTION Right 05/21/2021   Procedure: PERFLUORONE INJECTION;  Surgeon: Bernarda Caffey, MD;  Location: [facility];  Service: Ophthalmology;  Laterality: Right;   PHOTOCOAGULATION WITH LASER Right 05/21/2021   Procedure: PHOTOCOAGULATION WITH LASER;  Surgeon: Bernarda Caffey, MD;  Location: [facility];  Service: Ophthalmology;  Laterality: Right;   RADIOLOGY WITH ANESTHESIA N/A 07/17/2022   Procedure: IR WITH ANESTHESIA;  Surgeon: Radiologist, Medication, MD;  Location: [facility];   Service: Radiology;  Laterality: N/A;   RETINAL DETACHMENT SURGERY     Patient Active Problem List   Diagnosis Date Noted   Paroxysmal atrial fibrillation ([place]) 09/01/2022   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 ([place]) 07/22/2022   Protein-calorie malnutrition, severe 07/19/2022   Stroke (cerebrum) ([place]) 07/17/2022   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   Celiac disease    Alcohol dependence ([place])    Bradycardia      ONSET DATE: 07/17/22   REFERRING DIAG: I63.81 (ICD-10-CM) - Other cerebral infarction due to occlusion or stenosis of small artery  THERAPY DIAG:  Cognitive communication deficit  Rationale for Evaluation and Treatment: Rehabilitation  SUBJECTIVE:   SUBJECTIVE STATEMENT: "I don't want that." (pt, re: his original song)  Pt accompanied by: significant other [name] (wife)  PERTINENT HISTORY: Robbin presented to the ED with acute onset of left hemiplegia, right gaze deviation and left hemineglect while working in his garage.  TNK given and recanalization acheived with thrombectomy.  Later at 1800 noted right sided weakness and new CT Head and CTA head and neck reordered and negative for reocclusion. MRI today shows bilateral basal ganglia infarcts. ASA 34m started 12/17  Pt with ETT for thrombectomy and overnight.  Extubated 12/17    PAIN:  Are you having pain? No   PATIENT GOALS: Pt did not answer question when asked, due to attention/processing deficits  OBJECTIVE:   DIAGNOSTIC FINDINGS:  FEES 07/30/22 Pt demonstrates a moderate oropharyngeal dysphagia that is impacted by patient's current cognitive deficits. Patient's oral phase is characterized by poor awareness of bolus resulting in inconsistent timing of swallowing trigger. With solids, patient needed verbal cues (snapping and counting) to attend and initiate a swallow with all boluses contained in the velleculae. With liquids, patient consistently triggered his swallow at the pyriform sinuses. This  resulted in sensed aspiration of thin liquids via cup. Patient also with inconsistent amounts of pharyngeal residue that cleared with a 2nd subsequent swallow. No penetration or aspiration noted with nectar-thick liquids except for 1 episode of flash penetration. Provided thorough education to the patient's wife regarding patient's current swallowing function and that he is currently at a higher risk of aspiration due to cognitive deficits. She verbalized understanding and agreement with recommendations of continuing Dys. 3 textures but upgrading to nectar-thick liquids via cup with strict aspiration precautions and supervision. This will hopefully improve patient's intake of liquids with improved ability to stay hydrated without IV fluids prior to potential discharge on 08/03/22. SLP Diet Recommendations Dysphagia 3 (Mech soft) solids;Nectar thick liquid Liquid Administration via Cup;Spoon;No straw Medication Administration Crushed with puree Compensations: Slow rate;Small sips/bites;Minimize environmental distractions;Multiple dry swallows after each bite/sip  Today, pt ate dys III and drank nectar liqiuds. See "clinicial impressions" for results.  STANDARDIZED ASSESSMENTS: CLQT: may need to be completed in first 1-3 sessions  PATIENT REPORTED OUTCOME MEASURES (PROM): Cognitive function: Short Form: to be administered in first 1-3 sessions   TODAY'S TREATMENT:                                                                                                                                         DATE:  09/22/22: Pt demonstrated min-mod deficits with divided attention between singing song lyrics, strumming, singing, and playing chords. Chords suffered if pt sang more distinct lyrics and chords suffered if pt focused more on singing and/or strumming. Pt used alternating attention when SLP asked him questions; occasionally pt did not answer SLP, likely due to decr'd attention.  Pt's voice exhibited  greater amount of WNL voice in higher pitches than lower pitches. SLP strongly urged pt to practice guitar at least 20 minutes a day and explained rationale.   09/20/22:  Pt needs CLQT and PROM. Pt upset about not finishing homework. Shanon Brow entered today after OT [place] affect. Attention targeted today in conversation. Pt held topic with WNL topic maintenance for 12 minutes, then 11 minutes. Pt is tracking his own meds correctly at this time. Does not have a pill organizer, just uses bottles. Pt cooking with wife at home - smoke alarm went off due to pt not turning stove down from searing fish. Wife stated smoke alarm is "sensitive", however SLP questions if pt should be cooking only with wife present in kitchen.    No coughing with  meals/liquids. Taking meds whole with liquids now.   09/15/22: SLP targeted attention and verbal expression today in a functional task. Pt was able to sustain attention for 10 minutes in two conversational tasks. 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 Pt recalled he won bet from Monday and SLP owes him 25 cents.  09/13/22: Pt needs cognitive PROM, CLQT. Consider [place] Naming Test. Pt and wife stated pt coughed with corn chips last night so SLP assessed pt with peanut butter crackers today without any overt s/sx aspiration or any oral stage difficulty, nor any other pharyngeal stage deficits. Pt deemed/appears safe with regular food items. Wife stated pt ate with bean dip but complained of pharyngeal stasis after coughing. SLP postulates if this was an anomalous incident as pt has not reported ANY coughing (nor wife endorsing any coughing) with POs.  Today SLP measured pt's loud /a/ with "hey" and with loud /a/ - variable loudness responses (never >73dB) between 62-73 dB. Loud /a/ averaged in upper 60s dB with usual mod A for loudness. Pt tearful about his singing voice; initially pt's voice very  tremulous and this subsided over time today. Pt told to practice 10 loud /a/ BID.  Pt recalled 3/7 details about this weekend's events. Wife with excellent cues for pt's memory. Today, pt initiated sentences with 3-4 words and then he paused for 5-8 seconds of silence prior to completing sentence; 20% of the time pt abandoned sentence/utterance.  09/08/22: Pt was doing bills this morning and called about a incr in their mortgage amount, independently. SLP asked pt to [facility] and pt able to do so (and noted SLP error) without 2/4 road names (aware of one error). Pt recalled 3/5 items from today's schedule prior to ST. He did not think or attempt to write down 3 medication names. He "bet" SLP he would remember to bring his vitamin D next session. Held attention for 8 minutes prior to observation of loss of attention. At times pt stops verbal expression as if he might be experiencing anomia.  SLP to consider administering [place] (BNT-2).   09/06/22: Pt was able to hold 7-8 minute conversation with SLP today in therapy, x3. Speech volume not as loud as Friday 09/03/22, and pt's processing appeared slower today in general than on Friday 09/03/22. Pt had busy weekend and made schedule for the day with [facility] assistance. 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 Pt to cont to follow schedule to encourage brain stimulating tasks at home.  CONSIDER ADMINISTRATION OF CLQT for obtaining a baseline of pt's cognitive linguistics.  09/03/22: Pt recalled 3 iof his questions and the answers from Dr. Naaman Plummer appointment spontaneously. SLP and wife provided the other two with min-mod A and total A. Pt recalled one answer from these two spontaneously. Pt held attention for 10 minutes in conversation about this appointment and about exercise.   08/31/22: Pt and wife deny cont'd overt s/sx  aspiration with meals. Pt still does not take meds with water due to "They are bitter." [name] said pt has Dr. Naaman Plummer tomorrow so SLP had pt write down questions he has for Dr. Naaman Plummer. He thought of 2 spontaneously (5 with mod-max cues), and req'd consistent extra time to think of questions, mod A  consistently for legible writing (decr'd emergent awareness), mod A usually for writing question in a format he would recall tomorrow (decr'd anticipatory awareness). [name] remarked that often she is having to encourage pt to get up from the chair to do things during the day. SLP strongly suggested a schedule and provided example of this, and also reiterated that pt will have decr'd ability to initiate tasks at this time and may require wife's cues to do daily tasks. SLP provided example of SLP calling pt's name in waiting room and him not arising until wife arises. Pt showing some incr'd awareness asking SLP if he will be able to return to things he was doing pre-CVA. 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  1/25:24: Wife asked if pt was safe for thin liquids. SLP told pt/wife that if pt took single sips and was not exhibiting coughing he could have thin liquids. SLP to assess next session. In a quiet environment, pt completed written math task for 9 minutes prior to requiring 5 minute break, evidenced by difficulty with/inability in adding simple figures. Pt impulsive about starting back to task again and req'd SLP cueing for longer break. After 6 minutes, pt said, "Get back to this again," and re-focused on written math task- processing speed improved for 3 more entries until pt had to leave. SLP educated/highlighted to pt and wife the importance of taking breaks when he feels "stuck".   08/24/22: speech/cognition: Pt demonstrated sustained attention for 7 minutes today for therapy task of writing down reasons for each med. He demonstrated attention adequate to  recall 3/5 reasons 2 minutes later, when given extra time, and required mod A with other two. He continues to perform attention lengthening tasks at home. Wife has downloaded [employer] to her phone and has directed pt to complete. Pt rec'd cont'd rationale today for his meds - he "does not like taking pills." Pt inquired reason for CVA today in session and was told by wife and SLP. Today, pt's reasoning questionable with rationale to not take med/s with pureed but pt indicated he did not enjoy the smoothies wife was making for him with meds in them.  Swallowing: SLP discussed ways to assist pt in taking meds as they are currently being crushed and put into a smoothie for pt; "Do I have to have the smoothies" pt inquired. SLP suggested meds with applesauce, and yogurt, one pureed item at a time, and pt declined each time. SLP, pt, and wife eventually agreed pt may attempt one med with pureed item until next session.   PATIENT EDUCATION: Education details: see "today's treatment" Person educated: Patient and Spouse Education method: Explanation Education comprehension: verbalized understanding and needs further education   GOALS: Goals reviewed with patient? No  SHORT TERM GOALS: Target date: 2/9/2, 09/27/22 (revised 2/12/224)  Pt will follow aspiration precautions from FEES with occasional min A in 2 sessions Baseline: Goal status: deferred - pt not coughing with POs  2.  [place] will demo sustained attention to participate in 10 minute therapy task x3/session, in 3 sessions Baseline: 09/03/22, 09-20-22, 09/22/22 Goal status: met  3.  Pt will demo understanding of written cues in simple therapy tasks, with initial cue allowed, in 3 sessions Baseline: 08-19-22, 09-03-22 Goal status: Met  4.  Pt will demo selective attention for 10 minutes to a simple therapy task in a min-mod noisy environment Baseline:  Goal status: Ongoing  5.  Pt/wife will tell SLP 3 overt s/sx aspiration PNA with  modified independence Baseline:  Goal status: Deferred - pt  no longer couhging with POs  6   Using compensations, pt will ask wife to give him his medications, at least once/day between 3 sessions Baseline:  Goal status: Met   LONG TERM GOALS: Target date: 11/09/22  Pt will follow aspiration precautions from FEES with rare min A in 3 sessions Baseline:  Goal status: Deferred   2.  Pt will follow aspiration precautions from FEES with modified independence in 2 sessions Baseline:  Goal status: deferred  3.  Pt will demo selective attention for 20 minutes to a simple therapy task in a min-mod noisy environment Baseline:  Goal status: Ongoing  4.  Pt will demo emergent awareness in simple therapy tasks with verbal cue for error awareness in 3 sessions Baseline:  Goal status: Ongoing  5.  Pt will demo alternating attention for 20 minutes to complete simple-mod complex therapy tasks 100% accuracy in 3 sessions Baseline:  Goal status: Ongoing  6.  Using compensations, pt will recall to administer his own medications correctly, at least once/day between 3 sessions Baseline:  Goal status: Ongoing  7.  Wife's PROM (due to pt's attention deficit) score for pt will improve in last 1-2 sessions than the score taken in the first 1-2 sessions Baseline:  Goal status: Ongoing  ASSESSMENT:  CLINICAL IMPRESSION: Patient is a 72 y.o. male who was seen today for treatment of simple cognition (primarily attention) in light of CVA 07-17-22. Sustained attention maintained for >10 minutes in conversation today's session, as well as 7 minutes for two songs which he played with this guitar. Pt swallowing is WNL, is now tracking his own meds correctly. .   OBJECTIVE IMPAIRMENTS: include attention, memory, awareness, executive functioning, receptive language, and dysphagia. These impairments are limiting patient from return to work, managing medications, managing appointments, managing finances,  household responsibilities, ADLs/IADLs, effectively communicating at home and in community, and safety when swallowing. Factors affecting potential to achieve goals and functional outcome are ability to learn/carryover information, cooperation/participation level, and severity of impairments.. Patient will benefit from skilled SLP services to address above impairments and improve overall function.  REHAB POTENTIAL: Good  PLAN:  SLP FREQUENCY: 2x/week  SLP DURATION: 12 weeks  PLANNED INTERVENTIONS: Aspiration precaution training, Pharyngeal strengthening exercises, Diet toleration management , Environmental controls, Trials of upgraded texture/liquids, Cueing hierachy, Cognitive reorganization, Internal/external aids, Multimodal communication approach, SLP instruction and feedback, Compensatory strategies, and Patient/family education    [facility], [place] 09/22/2022, 2:52 PM

## 2022-09-23 ENCOUNTER — Other Ambulatory Visit (HOSPITAL_COMMUNITY): Payer: Self-pay

## 2022-09-24 IMAGING — CT CT ANGIO CHEST-ABD-PELV FOR DISSECTION W/ AND WO/W CM
2 of 7 series · 13 of 46 positions shown, 15 images · IV contrast (APPLIED)
Comparison: MRI abdomen 03/27/2019

CLINICAL DATA: Abdominal and chest pain radiating to the back.
Concern for dissection.

EXAM:
CT ANGIOGRAPHY CHEST, ABDOMEN AND PELVIS
TECHNIQUE: Non-contrast CT of the chest was initially obtained.

[Series 6: axial arterial · axial · arterial · 0.73mm/px · z∈[+772,+1390]mm · 10 of 349 slices shown, 12 images]
[im 20/349  soft-tissue]
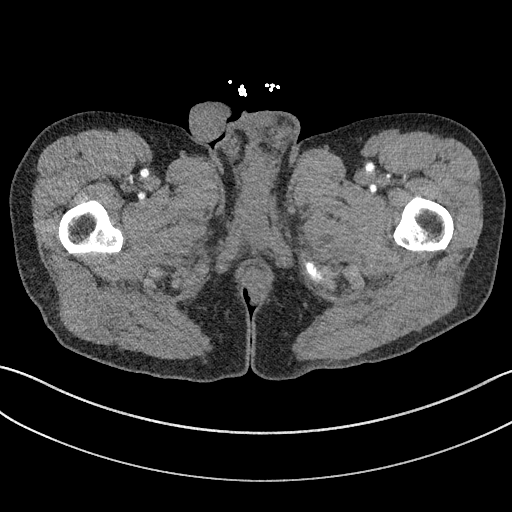
[im 20/349  bone]
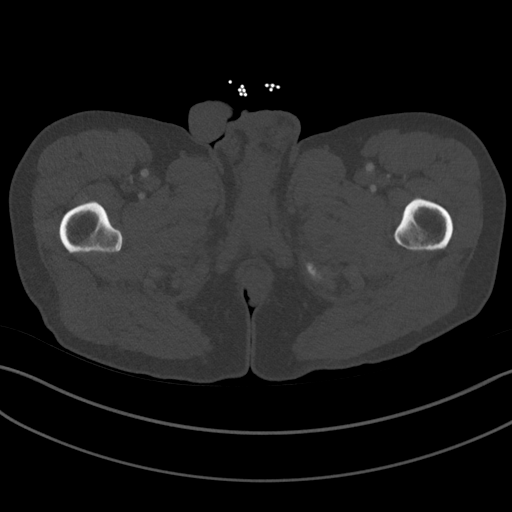
[im 59/349  soft-tissue]
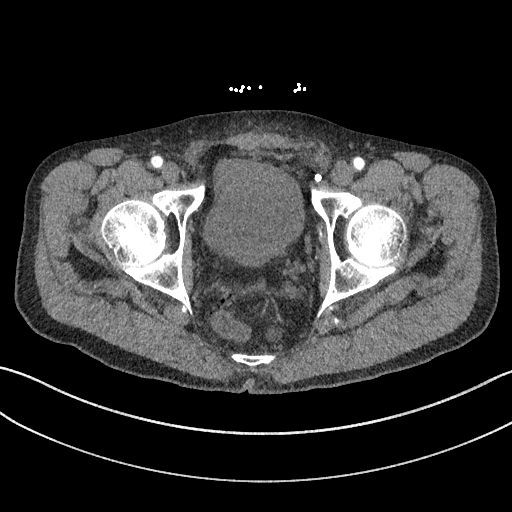
[im 97/349  soft-tissue]
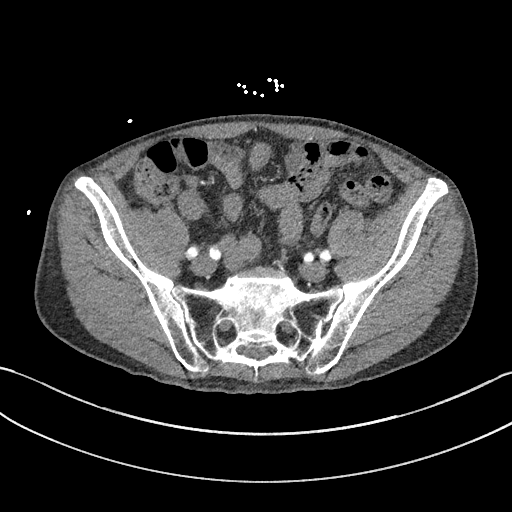
[im 117/349  soft-tissue]
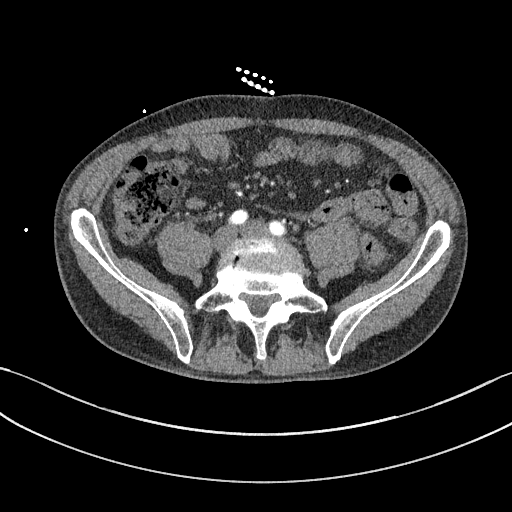
[im 155/349  soft-tissue]
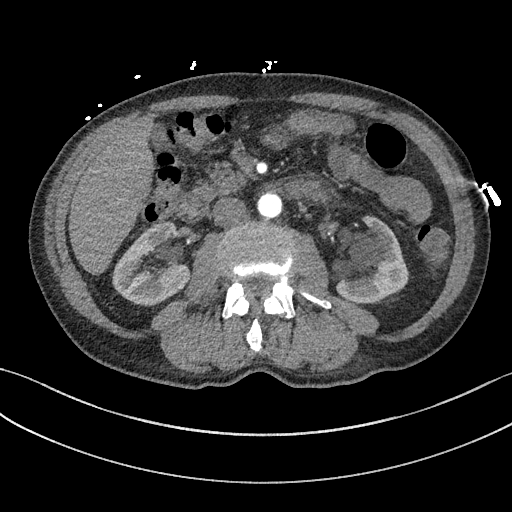
[im 194/349  soft-tissue]
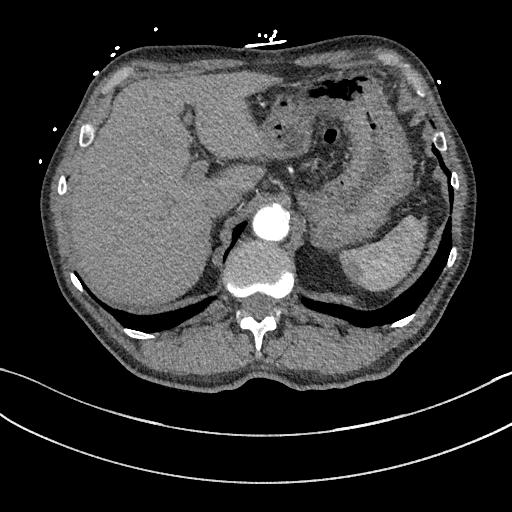
[im 233/349  soft-tissue]
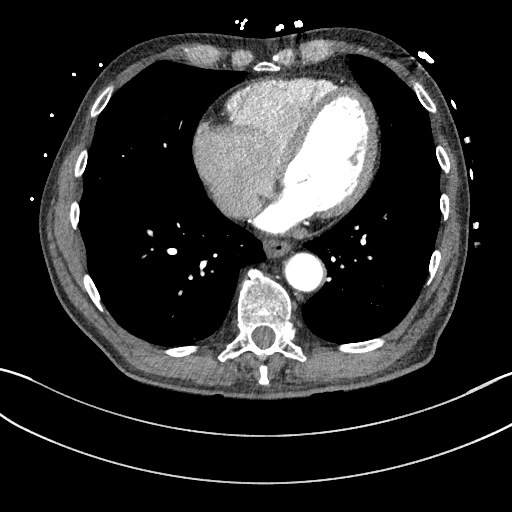
[im 252/349  soft-tissue]
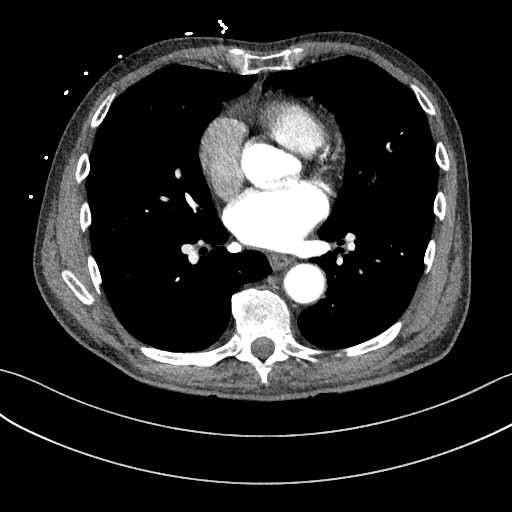
[im 291/349  soft-tissue]
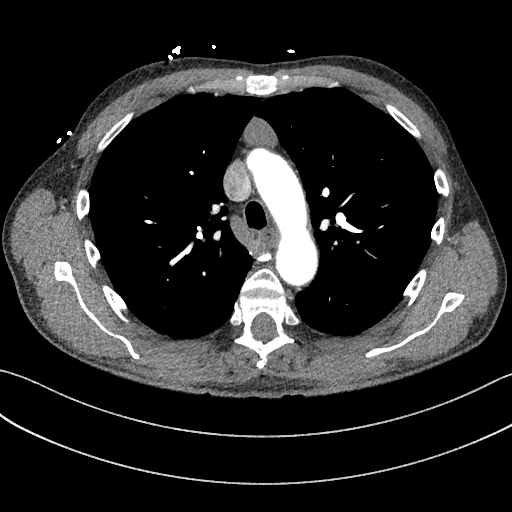
[im 291/349  bone]
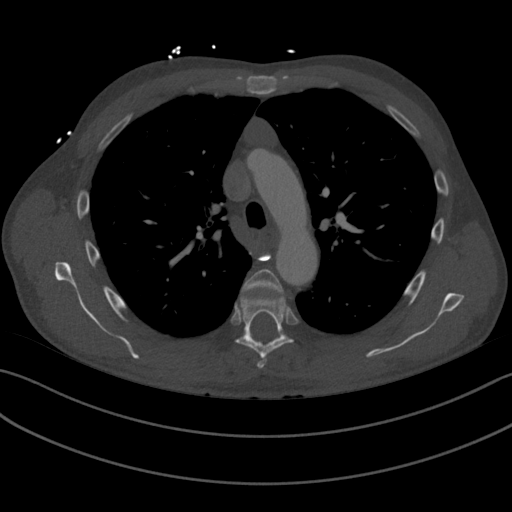
[im 329/349  soft-tissue]
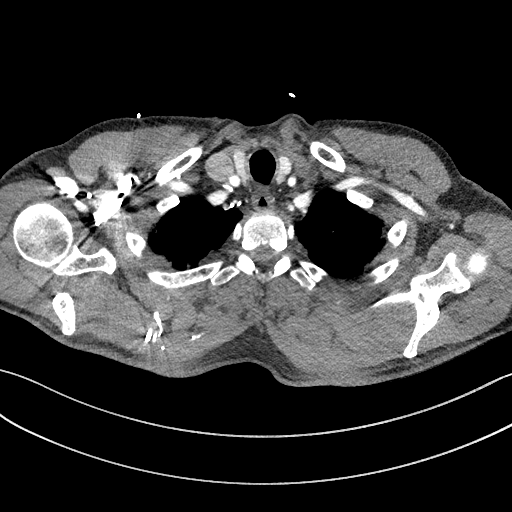

[Series 9: cor soft · coronal · 0.70mm/px · 3 of 143 slices shown]
[im 36/143  soft-tissue]
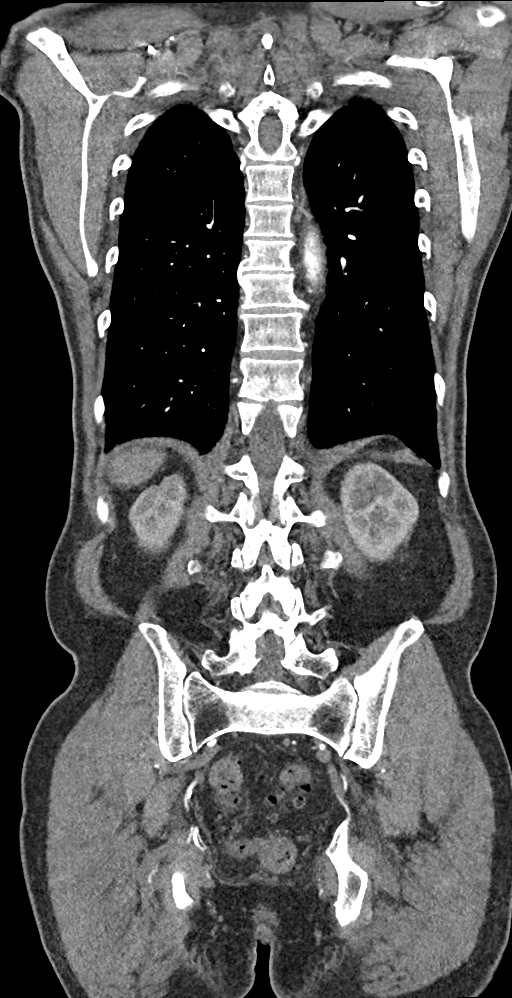
[im 72/143  soft-tissue]
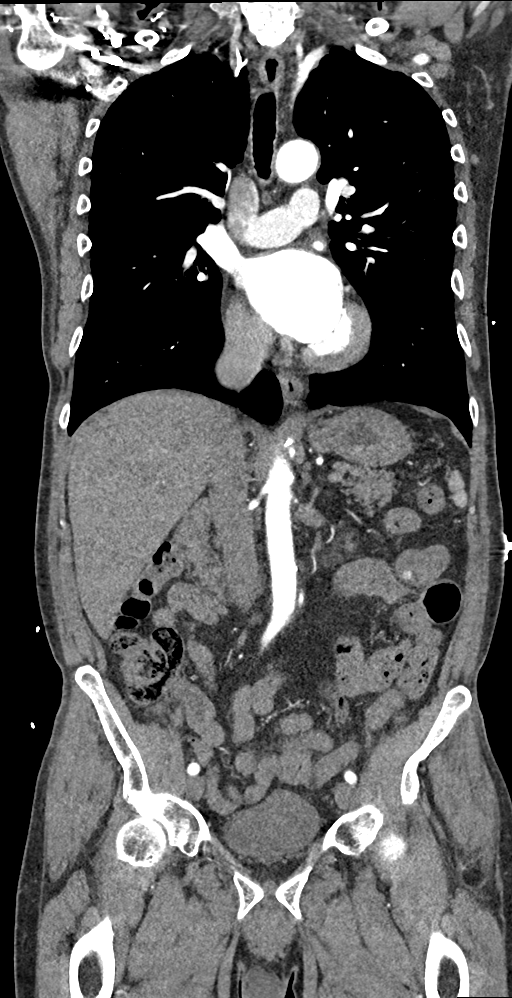
[im 107/143  soft-tissue]
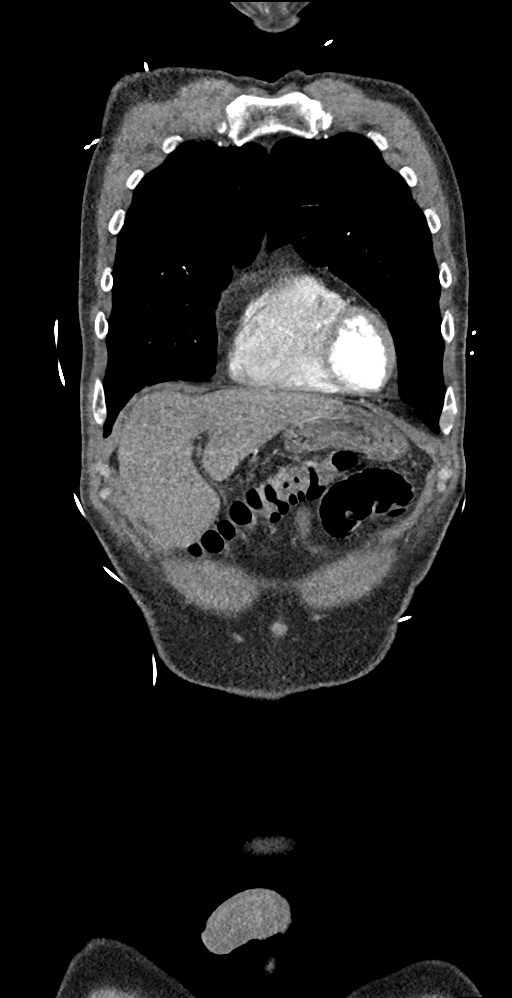

[13 of 46 positions shown; findings below may reference images not displayed]

Multidetector CT imaging through the chest, abdomen and pelvis was
performed using the standard protocol during bolus administration of
intravenous contrast. Multiplanar reconstructed images and MIPs were
obtained and reviewed to evaluate the vascular anatomy.

CONTRAST:  75mL OMNIPAQUE IOHEXOL 350 MG/ML SOLN
FINDINGS: CTA CHEST FINDINGS

Cardiovascular: No aortic intramural hematoma identified on
precontrast images. No aortic dissection or aneurysm. Heart size is
within normal limits.

Mediastinum/Nodes: 5.9 x 2.5 x 2.0 cm ovoid mass noted in the
anterior mediastinum. No additional enlarged hilar, axillary, or
mediastinal lymph nodes are identified.

Lungs/Pleura: Lungs are clear. No pleural effusion or pneumothorax.

Musculoskeletal: No chest wall abnormality. No acute or significant
osseous findings.

Review of the MIP images confirms the above findings.

CTA ABDOMEN AND PELVIS FINDINGS

VASCULAR

Aorta: Scattered calcified atheromatous plaque without aneurysm.

Celiac: Patent without evidence of aneurysm, dissection, vasculitis
or significant stenosis.

SMA: Patent without evidence of aneurysm, dissection, vasculitis or
significant stenosis.

Renals: Both renal arteries are patent without evidence of aneurysm,
dissection, vasculitis, fibromuscular dysplasia or significant
stenosis.

IMA: Patent without evidence of aneurysm, dissection, vasculitis or
significant stenosis.

Inflow: Patent without evidence of aneurysm, dissection, vasculitis
or significant stenosis.

Veins: No obvious venous abnormality within the limitations of this
arterial phase study.

Review of the MIP images confirms the above findings.

NON-VASCULAR

Hepatobiliary: No focal liver abnormality is seen. No gallstones,
gallbladder wall thickening, or biliary dilatation.

Pancreas: Unremarkable. No pancreatic ductal dilatation or
surrounding inflammatory changes.

Spleen: Normal in size without focal abnormality.

Adrenals/Urinary Tract: Adrenal glands are normal. Lobulated
low-density structures in the left renal hilum most likely due to
renal sinus cysts. Kidneys and ureters otherwise unremarkable. Soft
tissue thickening extending from the anterior bladder dome towards
the umbilicus suggestive of urachal remnant tissue. Bladder
otherwise normal.

Stomach/Bowel: Mild diffuse thickening of the esophageal wall. No
significant abnormality of the stomach. Diverticulosis of the
sigmoid colon without evidence of acute diverticulitis. The appendix
is normal.

Lymphatic: No enlarged abdominal or pelvic lymph nodes identified.

Reproductive: Mildly enlarged prostate.

Other: Left inguinal hernia repair changes are seen.

Musculoskeletal: No acute or significant osseous findings.

Review of the MIP images confirms the above findings.
IMPRESSION: 1. No acute abnormality of the chest, abdomen, or pelvis.
2. No aortic dissection.
3. Anterior mediastinal mass measuring 5.9 x 2.5 x 2.0 cm, most
likely related to abnormally enlarged lymph node or thymoma.
Cardiothoracic surgery consultation should be considered.
4. Diffuse thickening of the esophageal wall may be due to under
distension, however esophagitis can have a similar appearance.

## 2022-09-27 ENCOUNTER — Other Ambulatory Visit (HOSPITAL_COMMUNITY): Payer: Self-pay

## 2022-09-27 ENCOUNTER — Encounter: Payer: Medicare HMO | Admitting: Occupational Therapy

## 2022-09-27 ENCOUNTER — Encounter: Payer: Self-pay | Admitting: Physical Therapy

## 2022-09-27 ENCOUNTER — Encounter: Payer: Self-pay | Admitting: Diagnostic Neuroimaging

## 2022-09-27 ENCOUNTER — Other Ambulatory Visit: Payer: Self-pay

## 2022-09-27 ENCOUNTER — Ambulatory Visit: Payer: Medicare HMO | Admitting: Physical Therapy

## 2022-09-27 ENCOUNTER — Ambulatory Visit: Payer: Medicare HMO | Admitting: Diagnostic Neuroimaging

## 2022-09-27 VITALS — BP 132/60 | HR 79 | Ht 70.0 in | Wt 149.0 lb

## 2022-09-27 DIAGNOSIS — R2689 Other abnormalities of gait and mobility: Secondary | ICD-10-CM

## 2022-09-27 DIAGNOSIS — I480 Paroxysmal atrial fibrillation: Secondary | ICD-10-CM

## 2022-09-27 DIAGNOSIS — I63413 Cerebral infarction due to embolism of bilateral middle cerebral arteries: Secondary | ICD-10-CM

## 2022-09-27 DIAGNOSIS — R293 Abnormal posture: Secondary | ICD-10-CM

## 2022-09-27 DIAGNOSIS — R2681 Unsteadiness on feet: Secondary | ICD-10-CM

## 2022-09-27 DIAGNOSIS — M6281 Muscle weakness (generalized): Secondary | ICD-10-CM

## 2022-09-27 DIAGNOSIS — M79659 Pain in unspecified thigh: Secondary | ICD-10-CM | POA: Insufficient documentation

## 2022-09-27 DIAGNOSIS — R41841 Cognitive communication deficit: Secondary | ICD-10-CM | POA: Diagnosis not present

## 2022-09-27 MED ORDER — TRAMADOL HCL 50 MG PO TABS
50.0000 mg | ORAL_TABLET | Freq: Four times a day (QID) | ORAL | 0 refills | Status: DC | PRN
  Filled 2022-09-27: qty 20, 5d supply, fill #0

## 2022-09-27 NOTE — Therapy (Signed)
OUTPATIENT PHYSICAL THERAPY NEURO EVALUATION   Patient Name: Douglas Cox MRN: [id number] DOB:1950/08/16, 72 y.o., male Today's Date: 09/27/2022   PCP: Berneta Levins, DO REFERRING PROVIDER: Meredith Staggers, MD   END OF SESSION:  PT End of Session - 09/27/22 1031     Visit Number 1    Number of Visits 12    Date for PT Re-Evaluation 11/05/22    Authorization Type [employer]    Progress Note Due on Visit 10    PT Start Time 1100    PT Stop Time 1144    PT Time Calculation (min) 44 min    Activity Tolerance Patient tolerated treatment well    Behavior During Therapy Flat affect;WFL for tasks assessed/performed             Past Medical History:  Diagnosis Date   Celiac disease    Retinal detachment    Past Surgical History:  Procedure Laterality Date   CATARACT EXTRACTION Bilateral    Dr. Valetta Close   EYE SURGERY     FRACTURE SURGERY     GAS INSERTION Right 05/21/2021   Procedure: INSERTION OF GAS - C3F8;  Surgeon: Bernarda Caffey, MD;  Location: [place];  Service: Ophthalmology;  Laterality: Right;   GAS/FLUID EXCHANGE Right 05/21/2021   Procedure: GAS/FLUID EXCHANGE;  Surgeon: Bernarda Caffey, MD;  Location: [place];  Service: Ophthalmology;  Laterality: Right;   HERNIA REPAIR     IR CT HEAD LTD  07/17/2022   IR PERCUTANEOUS ART THROMBECTOMY/INFUSION INTRACRANIAL INC DIAG ANGIO  07/17/2022   IR US GUIDE VASC ACCESS RIGHT  07/17/2022   PARS PLANA VITRECTOMY Right 05/21/2021   Procedure: PARS PLANA VITRECTOMY WITH 25 GAUGE;  Surgeon: Bernarda Caffey, MD;  Location: [place];  Service: Ophthalmology;  Laterality: Right;   PERFLUORONE INJECTION Right 05/21/2021   Procedure: PERFLUORONE INJECTION;  Surgeon: Bernarda Caffey, MD;  Location: [place];  Service: Ophthalmology;  Laterality: Right;   PHOTOCOAGULATION WITH LASER Right 05/21/2021   Procedure: PHOTOCOAGULATION WITH LASER;  Surgeon: Bernarda Caffey, MD;  Location: [place];  Service: Ophthalmology;  Laterality: Right;    RADIOLOGY WITH ANESTHESIA N/A 07/17/2022   Procedure: IR WITH ANESTHESIA;  Surgeon: Radiologist, Medication, MD;  Location: [place];  Service: Radiology;  Laterality: N/A;   RETINAL DETACHMENT SURGERY     Patient Active Problem List   Diagnosis Date Noted   Paroxysmal atrial fibrillation ([place]) 09/01/2022   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 ([place]) 07/22/2022   Protein-calorie malnutrition, severe 07/19/2022   Stroke (cerebrum) ([place]) 07/17/2022   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   Celiac disease    Alcohol dependence ([place])    Bradycardia     ONSET DATE: 09/20/2022 (MD referral)  REFERRING DIAG:  Diagnosis  I63.81 (ICD-10-CM) - 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 ([place])    THERAPY DIAG:  Muscle weakness (generalized)  Abnormal posture  Unsteadiness on feet  Other abnormalities of gait and mobility  Rationale for Evaluation and Treatment: Rehabilitation  SUBJECTIVE:  SUBJECTIVE STATEMENT: Biggest problem is pain in hips with sleeping.  Has gotten worse.  "Not much trouble with balance."  Wife reports he shuffles and doesn't pick up his feet as much as before the stroke.  Wife reports one fall, tripped over rowing machine.  Tends to trip or stumble over rug or vines. Pt accompanied by: significant other  PERTINENT HISTORY: Mediastinal mass, bradycardia; basal ganglia CVA from MD notes, distant hx of A-fib, celiac disease, retinal detachment, HLD, HTN, sleep apnea.   From hospital notes:   Presented to ED w/ acute onset of left-sided weakness as well as right frontal headache.  Ad[facility] to [facility] on [facility]/17/20[facility].  See imaging.   PAIN:   Are you having pain? No  *Pain at night in bilateral hips 10/10.  Sleeps on side.  Alleviates pain by straightening  legs.  PRECAUTIONS: Fall  WEIGHT BEARING RESTRICTIONS: No  FALLS: Has patient fallen in last 6 months? Yes. Number of falls 1  LIVING ENVIRONMENT: Lives with: lives with their spouse Lives in: House/apartment Stairs: Yes: Internal: 14 steps; on right going up and External: 14 steps; on left going up Has following equipment at home: Single point cane  PLOF: Independent with basic ADLs and Independent with gait  Enjoyed hiking 2-3 miles, foraging for mushrooms.  Joined the [employer] [facility] and has been swimming twice.  *Since CVA, mostly sits or lies on sofa during the day.  PATIENT GOALS: Pt's goals for therapy are to make pain stop, to get back to outdoor activities.  Wife's goal to pick up feet more.  OBJECTIVE:   DIAGNOSTIC FINDINGS:  (From rehab discharge note) CT scan showed loss of gray-white differentiation in the right insular cortex lateral aspect of the right lentiform and right temporal tip consistent with acute infarction.  No acute hemorrhage.  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  Status post TNK as well as recanalization per interventional radiology.  MRI follow-up showed acute/subacute nonhemorrhagic infarct of the right caudate head and lentiform nucleus as noted on prior previous CT.   COGNITION: Overall cognitive status:  See speech notes; flat affect at eval   SENSATION: Light touch: WFL   MUSCLE LENGTH:  Passive from 90/90 position in supine Hamstrings: Right -30 deg; Left -40 deg   POSTURE: rounded shoulders, forward head, posterior pelvic tilt, and chooses to sit with posterior pelvic tilt, leaned back against wall.  LOWER EXTREMITY ROM:   Tightness in hamstrings, tenderness to palpation along superior aspect of IT band, especially greater trochanter; tightness at hip flexors in sidelying  LOWER EXTREMITY MMT:    MMT Right Eval Left Eval  Hip flexion 4+ 4  Hip extension    Hip abduction 3+ 4  Hip adduction 4 4  Hip internal  rotation    Hip external rotation    Knee flexion 4 3+  Knee extension 4 4  Ankle dorsiflexion 3+ 3+  Ankle plantarflexion    Ankle inversion    Ankle eversion    (Blank rows = not tested)  BED MOBILITY:  Independent, extra time  TRANSFERS: Assistive device utilized: None  Sit to stand: Modified independence Stand to sit: Modified independence  STAIRS: Level of Assistance: SBA Stair Negotiation Technique: Alternating Pattern  with Bilateral Rails Number of Stairs: 2-3  Height of Stairs: 4"-6"  Comments: Decreased foot clearance  GAIT: Gait pattern:  veers to R with gait challenges, step through pattern, decreased ankle dorsiflexion- Right, decreased ankle dorsiflexion- Left, genu recurvatum-  Left, wide BOS, poor foot clearance- Right, and poor foot clearance- Left Distance walked: 50 ft x 4 Assistive device utilized: None Level of assistance: SBA Comments: veers to R with balance challenges  FUNCTIONAL TESTS:  10 meter walk test: 11.78 sec (2.78 ft/sec) self-selected; FAST:  8.47 sec = 3.87 ft/sec Functional gait assessment: 14/30 (scores <22/30 indicate increased fall risk); score has decreased from 17/30 at initial eval in hospital  [facility] PT Assessment - 09/27/22 0001       Functional Gait  Assessment   Gait assessed  Yes    Gait Level Surface Walks 20 ft, slow speed, abnormal gait pattern, evidence for imbalance or deviates 10-15 in outside of the 12 in walkway width. Requires more than 7 sec to ambulate 20 ft.   8 sec   Change in Gait Speed Makes only minor adjustments to walking speed, or accomplishes a change in speed with significant gait deviations, deviates 10-15 in outside the 12 in walkway width, or changes speed but loses balance but is able to recover and continue walking.    Gait with Horizontal Head Turns Performs head turns with moderate changes in gait velocity, slows down, deviates 10-15 in outside 12 in walkway width but recovers, can continue to walk.     Gait with Vertical Head Turns Performs task with moderate change in gait velocity, slows down, deviates 10-15 in outside 12 in walkway width but recovers, can continue to walk.    Gait and Pivot Turn Turns slowly, requires verbal cueing, or requires several small steps to catch balance following turn and stop    Step Over Obstacle Is able to step over one shoe box (4.5 in total height) but must slow down and adjust steps to clear box safely. May require verbal cueing.    Gait with Narrow Base of Support Is able to ambulate for 10 steps heel to toe with no staggering.    Gait with Eyes Closed Walks 20 ft, slow speed, abnormal gait pattern, evidence for imbalance, deviates 10-15 in outside 12 in walkway width. Requires more than 9 sec to ambulate 20 ft.   11.18 sec   Ambulating Backwards Walks 20 ft, uses assistive device, slower speed, mild gait deviations, deviates 6-10 in outside 12 in walkway width.    Steps Alternating feet, must use rail.    Total Score 14    FGA comment: Scores <22/30 indicate increased fall risk             PATIENT SURVEYS:  NA   M-CTSIB  Condition 1: Firm Surface, EO 30 Sec, Normal Sway  Condition 2: Firm Surface, EC 30 Sec, Mild Sway  Condition 3: Foam Surface, EO 30 Sec, Mild Sway  Condition 4: Foam Surface, EC 30 Sec, Mild Sway     TODAY'S TREATMENT:  DATE: 09/27/2022    PATIENT EDUCATION: Education details: Initial HEP, ice massage to IT band/greater trochanter; PT eval results, POC Person educated: Patient and Spouse Education method: Explanation, Demonstration, and Handouts Education comprehension: verbalized understanding, returned demonstration, and needs further education  HOME EXERCISE PROGRAM: Access Code: [facility] URL: [email or web address] Date: 09/27/2022 Prepared by: [facility]  Exercises - Seated Hamstring Stretch  - 2 x daily - 7 x weekly - 1 sets - 3 reps - 30 sec hold - Seated Figure 4 Piriformis Stretch  - 1-2 x daily - 7 x weekly - 1 sets - 3 reps - 30 sec hold  GOALS: Goals reviewed with patient? Yes  SHORT TERM GOALS: Target date: 10/08/2022  Pt will be supervision with HEP for improved flexibility, strength, balance, and gait. Baseline: Goal status: INITIAL  2.  Pt will report pain in hips at night <5/10, for improved sleep, bed mobility positioning. Baseline: 10/10 at night, interferes with sleep Goal status: INITIAL   LONG TERM GOALS: Target date: 11/05/2022  Pt will be independent with HEP for improved balance, flexibility, strength, gait. Baseline:  Goal status: INITIAL  2.  Pt will improve FGA score to at least 20/30 to decrease fall risk. Baseline: 14/30 Goal status: INITIAL  3.  Pt will improve gait velocity to at least 3 ft/sec self-selected speed. Baseline: 2.78 ft/sec Goal status: INITIAL  4.  Pt will ambulate at least 1000 ft, indoor and outdoor surfaces, modified independently for return to outdoor gait hiking. Baseline:  Goal status: INITIAL    ASSESSMENT:  CLINICAL IMPRESSION: Patient is a 72 y.o. male who was seen today for physical therapy evaluation and treatment for balance and gait post CVA.  He had basal ganglia CVA in December 2023; per pt and wife report, he was independent and active (hiking, fly fishing, foraging for mushrooms) prior to CVA.  He had previous PT eval at [facility], but wife reports he was not ready for rehab/have insight into his deficits at that point.  Pt presents to OPP[facility] today with decreased flexibility, decreased strength, decreased balance, abnormal gait pattern, reports of pain at night in bilateral hips causing fatigue during the day.  He is at fall risk per FGA score of 14/30.  He will benefit from skilled PT to address the above stated deficits for decreased fall risk  and return to independent PLOF.  OBJECTIVE IMPAIRMENTS: Abnormal gait, decreased balance, decreased knowledge of use of DME, decreased mobility, difficulty walking, decreased ROM, decreased strength, impaired flexibility, and pain.   ACTIVITY LIMITATIONS: standing, sleeping, transfers, bed mobility, and locomotion level  PARTICIPATION LIMITATIONS: community activity and fishing, hiking  PERSONAL FACTORS: 3+ comorbidities: see above  are also affecting patient's functional outcome.   REHAB POTENTIAL: Good  CLINICAL DECISION MAKING: Evolving/moderate complexity  EVALUATION COMPLEXITY: Moderate  PLAN:  PT FREQUENCY: 2x/week  PT DURATION: 6 weeks (including eval week)  PLANNED INTERVENTIONS: Therapeutic exercises, Therapeutic activity, Neuromuscular re-education, Balance training, Gait training, Patient/Family education, Self Care, Joint mobilization, Stair training, DME instructions, Cryotherapy, and Manual therapy  PLAN FOR NEXT SESSION: Ask about ice massage/pain; review initial HEP; work on hip strengthening, piriformis and IT band stretch; balance and gait training.   Frazier Butt., PT 09/27/2022, 12:07 PM  [facility] [place], [place], [place] [address] Phone # [phone] Fax # [phone]

## 2022-09-27 NOTE — Progress Notes (Signed)
[facility]  PATIENT: Douglas Cox DOB: Aug 11, 1950  REFERRING CLINICIAN: Janine Ores, NP HISTORY FROM: patient and wife REASON FOR VISIT: new consult   HISTORICAL  CHIEF COMPLAINT:  Chief Complaint  Patient presents with   New Patient (Initial Visit)    Patient in room #7 with his daughter. Pt here toady for f/u from a hospital visit.    HISTORY OF PRESENT ILLNESS:   72 year old male here for evaluation of stroke follow-up.  Patient presented to hospital on 07/17/2022 for left hemiplegia, right gaze deviation and right ICA occlusion.  He was treated with tenecteplase and mechanical thrombectomy.  He had resultant bilateral basal ganglia and right temporal lobe infarcts.  He went to inpatient rehabilitation.  He was diagnosed with atrial fibrillation and started on Eliquis.  Now he is living back at home.  Still having difficulty with some speech, cognitive, motivation and behaviors.  Balance coordination are slower than previously.  He has not been able to play guitar or sing like previously.  Has not been able to return to his other activities such as photography and acting.    REVIEW OF SYSTEMS: Full 14 system review of systems performed and negative with exception of: as per HPI.  ALLERGIES: Allergies  Allergen Reactions   Nitrous Oxide Other (See Comments)    Can cause blindness in right eye    Codone [Hydrocodone] Hives   Tylenol [Acetaminophen] Hives   Hydrocodone-Acetaminophen     Other Reaction(s): itching/hives   Oxycodone Hcl     Other Reaction(s): itching/hives   Clarithromycin Anxiety    Other Reaction(s): agitated   Gluten Meal Other (See Comments)    Stomach issues   Lactose Intolerance (Gi) Other (See Comments)    GI issues    HOME MEDICATIONS: Outpatient Medications Prior to Visit  Medication Sig Dispense Refill   apixaban (ELIQUIS) 5 MG TABS tablet Take 1 tablet (5 mg total) by mouth 2 (two) times daily. 60 tablet 1    Cyanocobalamin (VITAMIN B-12 IJ) Inject as directed.     famotidine (PEPCID) 20 MG tablet Take 1 tablet (20 mg total) by mouth 2 (two) times daily. 90 tablet 4   magnesium oxide (MAG-OX) 400 MG tablet Take 1/2 tablet (200 mg total) by mouth at bedtime. 30 tablet 5   Probiotic Product (ALIGN) 4 MG CAPS      rosuvastatin (CRESTOR) 20 MG tablet Take 1 tablet (20 mg total) by mouth daily. 30 tablet 3   SYRINGE-NEEDLE, DISP, 3 ML (B-D 3CC LUER-LOK SYR 25GX1") 25G X 1" 3 ML MISC Use as directed for B12 injections every 2 weeks 30 each 0   traMADol (ULTRAM) 50 MG tablet Take 1 tablet (50 mg total) by mouth every 6 (six) hours as needed for pain 20 tablet 0   vitamin D3 (CHOLECALCIFEROL) 25 MCG tablet Take 1 tablet (1,000 Units total) by mouth daily. 30 tablet 5   No facility-administered medications prior to visit.    PAST MEDICAL HISTORY: Past Medical History:  Diagnosis Date   Celiac disease    Retinal detachment     PAST SURGICAL HISTORY: Past Surgical History:  Procedure Laterality Date   CATARACT EXTRACTION Bilateral    Dr. Valetta Close   EYE SURGERY     FRACTURE SURGERY     GAS INSERTION Right 05/21/2021   Procedure: INSERTION OF GAS - C3F8;  Surgeon: Bernarda Caffey, MD;  Location: [place];  Service: Ophthalmology;  Laterality: Right;   GAS/FLUID EXCHANGE Right 05/21/2021  Procedure: GAS/FLUID EXCHANGE;  Surgeon: Bernarda Caffey, MD;  Location: [place];  Service: Ophthalmology;  Laterality: Right;   HERNIA REPAIR     IR CT HEAD LTD  07/17/2022   IR PERCUTANEOUS ART THROMBECTOMY/INFUSION INTRACRANIAL INC DIAG ANGIO  07/17/2022   IR US GUIDE VASC ACCESS RIGHT  07/17/2022   PARS PLANA VITRECTOMY Right 05/21/2021   Procedure: PARS PLANA VITRECTOMY WITH 25 GAUGE;  Surgeon: Bernarda Caffey, MD;  Location: [place];  Service: Ophthalmology;  Laterality: Right;   PERFLUORONE INJECTION Right 05/21/2021   Procedure: PERFLUORONE INJECTION;  Surgeon: Bernarda Caffey, MD;  Location: [place];  Service:  Ophthalmology;  Laterality: Right;   PHOTOCOAGULATION WITH LASER Right 05/21/2021   Procedure: PHOTOCOAGULATION WITH LASER;  Surgeon: Bernarda Caffey, MD;  Location: [place];  Service: Ophthalmology;  Laterality: Right;   RADIOLOGY WITH ANESTHESIA N/A 07/17/2022   Procedure: IR WITH ANESTHESIA;  Surgeon: Radiologist, Medication, MD;  Location: [place];  Service: Radiology;  Laterality: N/A;   RETINAL DETACHMENT SURGERY      FAMILY HISTORY: History reviewed. No pertinent family history.  SOCIAL HISTORY: Social History   Socioeconomic History   Marital status: Married    Spouse name: Not on file   Number of children: Not on file   Years of education: Not on file   Highest education level: Not on file  Occupational History   Not on file  Tobacco Use   Smoking status: Former    Types: Cigarettes    Quit date: 01/30/2006    Years since quitting: 16.6   Smokeless tobacco: Never  Vaping Use   Vaping Use: Never used  Substance and Sexual Activity   Alcohol use: Not Currently    Alcohol/week: 4.0 - 5.0 standard drinks of alcohol    Types: 4 - 5 Shots of liquor per week    Comment: 3 glasses of whiskey per night.   Drug use: Not Currently    Frequency: 7.0 times per week    Types: Marijuana   Sexual activity: Not Currently  Other Topics Concern   Not on file  Social History Narrative   Not on file   Social Determinants of Health   Financial Resource Strain: Not on file  Food Insecurity: No Food Insecurity (07/21/2022)   Hunger Vital Sign    Worried About Running Out of Food in the Last Year: Never true    Ran Out of Food in the Last Year: Never true  Transportation Needs: No Transportation Needs (07/21/2022)   PRAPARE - Hydrologist (Medical): No    Lack of Transportation (Non-Medical): No  Physical Activity: Not on file  Stress: Not on file  Social Connections: Not on file  Intimate Partner Violence: Not At Risk (07/21/2022)   Humiliation, Afraid,  Rape, and Kick questionnaire    Fear of Current or Ex-Partner: No    Emotionally Abused: No    Physically Abused: No    Sexually Abused: No     PHYSICAL EXAM  GENERAL EXAM/CONSTITUTIONAL: Vitals:  Vitals:   09/27/22 1330  BP: 132/60  Pulse: 79  Weight: 149 lb (67.6 kg)  Height: '5\' 10"'$  (1.778 m)   Body mass index is 21.38 kg/m. Wt Readings from Last 3 Encounters:  09/27/22 149 lb (67.6 kg)  09/10/22 145 lb (65.8 kg)  09/01/22 148 lb (67.1 kg)   Patient is in no distress; well developed, nourished and groomed; neck is supple  CARDIOVASCULAR: Examination of carotid arteries is normal; no  carotid bruits Regular rate and rhythm, no murmurs Examination of peripheral vascular system by observation and palpation is normal  EYES: Ophthalmoscopic exam of optic discs and posterior segments is normal; no papilledema or hemorrhages No results found.  MUSCULOSKELETAL: Gait, strength, tone, movements noted in Neurologic exam below  NEUROLOGIC: MENTAL STATUS:      No data to display         awake, alert, oriented to person, place and time recent and remote memory intact normal attention and concentration language fluent, comprehension intact, naming intact fund of knowledge appropriate MILD APATHY  CRANIAL NERVE:  2nd - no papilledema on fundoscopic exam 2nd, 3rd, 4th, 6th - pupils equal and reactive to light, visual fields full to confrontation, extraocular muscles intact, no nystagmus 5th - facial sensation symmetric 7th - facial strength symmetric 8th - hearing intact 9th - palate elevates symmetrically, uvula midline 11th - shoulder shrug symmetric 12th - tongue protrusion midline  MOTOR:  normal bulk and tone, full strength in the BUE, BLE RIGHT HF 4, LEFT HF 3-4  SENSORY:  normal and symmetric to light touch, temperature, vibration  COORDINATION:  finger-nose-finger, fine finger movements normal  REFLEXES:  deep tendon reflexes 1+ and  symmetric  GAIT/STATION:  narrow based gait     DIAGNOSTIC DATA (LABS, IMAGING, TESTING) - I reviewed patient records, labs, notes, testing and imaging myself where available.  Lab Results  Component Value Date   WBC 7.6 07/23/2022   HGB 14.0 07/23/2022   HCT 40.2 07/23/2022   MCV 97.3 07/23/2022   PLT 326 07/23/2022      Component Value Date/Time   NA 140 08/01/2022 0600   K 3.6 08/01/2022 0600   CL 110 08/01/2022 0600   CO2 23 08/01/2022 0600   GLUCOSE 98 08/01/2022 0600   BUN 21 08/01/2022 0600   CREATININE 1.07 08/01/2022 0600   CALCIUM 8.7 (L) 08/01/2022 0600   PROT 6.7 07/23/2022 0521   ALBUMIN 2.7 (L) 07/23/2022 0521   AST 22 07/23/2022 0521   ALT 17 07/23/2022 0521   ALKPHOS 51 07/23/2022 0521   BILITOT 0.9 07/23/2022 0521   GFRNONAA >60 08/01/2022 0600   Lab Results  Component Value Date   CHOL 176 07/18/2022   HDL 44 07/18/2022   LDLCALC 84 07/18/2022   TRIG 241 (H) 07/18/2022   TRIG 246 (H) 07/18/2022   CHOLHDL 4.0 07/18/2022   Lab Results  Component Value Date   HGBA1C 5.7 (H) 07/18/2022   Lab Results  Component Value Date   VITAMINB12 178 (L) 08/11/2018   Lab Results  Component Value Date   TSH 8.084 (H) 03/25/2021    CT head Hyperdense right ICA terminus and proximal right M1 segment consistent with acute thrombus ASPECTS 7    CTA head & neck Occlusion of the right internal carotid artery at the level of the posterior communicating artery. Marked attenuation of a posterior right M2 branch which appears to fill, presumably from PL collateral vessels.  Repeat CT Head Evolving right MCA territory infarct appears to be limited to the right basal ganglia. Repeat CTA Head and Neck- Right internal carotid artery is reconstituted without focal stenosis or dissection through the ICA terminus. Right M1 segment and MCA bifurcations are now patent. Right MCA branch vessels are now revascularized. MRI  Acute/subacute nonhemorrhagic infarct of the right  caudate head and lentiform nucleus as noted on the previous CT scan. Acute/subacute nonhemorrhagic infarct of the left caudate head and lentiform nucleus spanning the internal  capsule. Acute/subacute nonhemorrhagic infarct of the cortex of the right temporal tip. Single punctate focus of restricted diffusion in the right precentral cortex. 2D Echo  LDL 84 HgbA1c 5.7   ASSESSMENT AND PLAN  72 y.o. year old male here with:   Dx:  1. Cerebrovascular accident (CVA) due to bilateral embolism of middle cerebral arteries (HCC)   2. Paroxysmal atrial fibrillation (HCC)       PLAN:  Stroke:  Bilateral basal ganglia infarcts s/p TNK and MT of terminal right ICA occlusion with successful complete recanalization Etiology: LIKELY paroxysmal A-fib  - continue therapy exercises; optimize nutrition, exercise, mental and emotional stimulating activities  Atrial fibrillation - continue eliquis  Hypertension Long-term BP goal normotensive   Hyperlipidemia LDL 84, goal < 70 Continue rosuvastatin 20 mg daily   Diabetes type II Controlled Home meds:  None HgbA1c 5.7, goal < 7.0     Return for return to PCP, pending if symptoms worsen or fail to improve.    Penni Bombard, MD [id number], [id number] PM Certified in Neurology, Neurophysiology and Neuroimaging  [facility] [address], [place], [place] [address] [phone]

## 2022-09-27 NOTE — Patient Instructions (Signed)
-   continue eliquis, rosuvastatin

## 2022-09-28 ENCOUNTER — Other Ambulatory Visit (HOSPITAL_COMMUNITY): Payer: Self-pay

## 2022-09-28 ENCOUNTER — Ambulatory Visit: Payer: Medicare HMO | Admitting: Occupational Therapy

## 2022-09-30 ENCOUNTER — Ambulatory Visit: Payer: Medicare HMO | Admitting: Physical Therapy

## 2022-09-30 ENCOUNTER — Ambulatory Visit: Payer: Medicare HMO

## 2022-09-30 DIAGNOSIS — R41841 Cognitive communication deficit: Secondary | ICD-10-CM | POA: Diagnosis not present

## 2022-09-30 DIAGNOSIS — R1312 Dysphagia, oropharyngeal phase: Secondary | ICD-10-CM

## 2022-09-30 DIAGNOSIS — M6281 Muscle weakness (generalized): Secondary | ICD-10-CM

## 2022-09-30 DIAGNOSIS — R2681 Unsteadiness on feet: Secondary | ICD-10-CM

## 2022-09-30 DIAGNOSIS — R2689 Other abnormalities of gait and mobility: Secondary | ICD-10-CM

## 2022-09-30 DIAGNOSIS — R293 Abnormal posture: Secondary | ICD-10-CM

## 2022-09-30 NOTE — Therapy (Signed)
OUTPATIENT SPEECH LANGUAGE PATHOLOGY TREATMENT   Patient Name: Douglas Cox MRN: [id number] DOB:Oct 04, 1950, 72 y.o., male Today's Date: 09/30/2022  PCP: None Recorded REFERRING PROVIDER: Alysia Penna, MD  END OF SESSION:  End of Session - 09/30/22 1011     Visit Number 14    Number of Visits 25    Date for SLP Re-Evaluation 11/09/22    SLP Start Time 0935    SLP Stop Time  1015    SLP Time Calculation (min) 40 min    Activity Tolerance Patient tolerated treatment well                      Past Medical History:  Diagnosis Date   Celiac disease    Retinal detachment    Past Surgical History:  Procedure Laterality Date   CATARACT EXTRACTION Bilateral    Dr. Valetta Close   EYE SURGERY     FRACTURE SURGERY     GAS INSERTION Right 05/21/2021   Procedure: INSERTION OF GAS - C3F8;  Surgeon: Bernarda Caffey, MD;  Location: [place];  Service: Ophthalmology;  Laterality: Right;   GAS/FLUID EXCHANGE Right 05/21/2021   Procedure: GAS/FLUID EXCHANGE;  Surgeon: Bernarda Caffey, MD;  Location: [place];  Service: Ophthalmology;  Laterality: Right;   HERNIA REPAIR     IR CT HEAD LTD  07/17/2022   IR PERCUTANEOUS ART THROMBECTOMY/INFUSION INTRACRANIAL INC DIAG ANGIO  07/17/2022   IR US GUIDE VASC ACCESS RIGHT  07/17/2022   PARS PLANA VITRECTOMY Right 05/21/2021   Procedure: PARS PLANA VITRECTOMY WITH 25 GAUGE;  Surgeon: Bernarda Caffey, MD;  Location: [place];  Service: Ophthalmology;  Laterality: Right;   PERFLUORONE INJECTION Right 05/21/2021   Procedure: PERFLUORONE INJECTION;  Surgeon: Bernarda Caffey, MD;  Location: [place];  Service: Ophthalmology;  Laterality: Right;   PHOTOCOAGULATION WITH LASER Right 05/21/2021   Procedure: PHOTOCOAGULATION WITH LASER;  Surgeon: Bernarda Caffey, MD;  Location: [place];  Service: Ophthalmology;  Laterality: Right;   RADIOLOGY WITH ANESTHESIA N/A 07/17/2022   Procedure: IR WITH ANESTHESIA;  Surgeon: Radiologist, Medication, MD;  Location: [place];   Service: Radiology;  Laterality: N/A;   RETINAL DETACHMENT SURGERY     Patient Active Problem List   Diagnosis Date Noted   Paroxysmal atrial fibrillation ([place]) 09/01/2022   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 ([place]) 07/22/2022   Protein-calorie malnutrition, severe 07/19/2022   Stroke (cerebrum) ([place]) 07/17/2022   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   Celiac disease    Alcohol dependence ([place])    Bradycardia      ONSET DATE: 07/17/22   REFERRING DIAG: I63.81 (ICD-10-CM) - 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  TNK given and recanalization acheived with thrombectomy.  Later at 1800 noted right sided weakness and new CT Head and CTA head and neck reordered and negative for reocclusion. MRI today shows bilateral basal ganglia infarcts. ASA '81mg'$  started 12/17  Pt with ETT for thrombectomy and overnight.  Extubated 12/17    PAIN:  Are you having pain? No   PATIENT GOALS: Pt did not answer question when asked, due to attention/processing deficits  OBJECTIVE:   DIAGNOSTIC  FINDINGS:  FEES 07/30/22 Pt demonstrates a moderate oropharyngeal dysphagia that is impacted by patient's current cognitive deficits. Patient's oral phase is characterized by poor awareness of bolus resulting in inconsistent timing of swallowing trigger. With solids, patient needed verbal cues (snapping and counting) to attend and initiate a swallow with all boluses contained in the velleculae. With liquids, patient consistently triggered his swallow at the pyriform sinuses. This  resulted in sensed aspiration of thin liquids via cup. Patient also with inconsistent amounts of pharyngeal residue that cleared with a 2nd subsequent swallow. No penetration or aspiration noted with nectar-thick liquids except for 1 episode of flash penetration. Provided thorough education to the patient's wife regarding patient's current swallowing function and that he is currently at a higher risk of aspiration due to cognitive deficits. She verbalized understanding and agreement with recommendations of continuing Dys. 3 textures but upgrading to nectar-thick liquids via cup with strict aspiration precautions and supervision. This will hopefully improve patient's intake of liquids with improved ability to stay hydrated without IV fluids prior to potential discharge on 08/03/22. SLP Diet Recommendations Dysphagia 3 (Mech soft) solids;Nectar thick liquid Liquid Administration via Cup;Spoon;No straw Medication Administration Crushed with puree Compensations: Slow rate;Small sips/bites;Minimize environmental distractions;Multiple dry swallows after each bite/sip  Today, pt ate dys III and drank nectar liqiuds. See "clinicial impressions" for results.  STANDARDIZED ASSESSMENTS: CLQT: may need to be completed in first 1-3 sessions  PATIENT REPORTED OUTCOME MEASURES (PROM): Cognitive function: Short Form: to be administered in first 1-3 sessions   TODAY'S TREATMENT:                                                                                                                                         DATE:  2/29/23: Pt needs CLQT and PROM. Pt with flat affect today, with seemingly dulled awareness -much moreso than last 3-4 sessions. Pt taking Tramadol to sleep in the past three nights due to pain. Today SLP targeted pt's selective attention and awareness with functional task - setting up [place]. Pt req'd cues for attention to SLP or wife's voice when asking him about setup, consistently, requiring  x2 requests to generate a verbal response, which was unintelligible 80% of the time today, requiring a third request from listener. Overall, pt was largely disengaged from verbal stimulation during ST session and was hyper-focused on the watch settings.   09/22/22: Pt demonstrated min-mod deficits with divided attention between singing song lyrics, strumming, singing, and playing chords. Chords suffered if pt sang more distinct lyrics and chords suffered if pt focused more on singing and/or strumming. Pt used alternating attention when SLP asked him questions; occasionally pt did not answer SLP, likely due to decr'd attention.  Pt's voice exhibited greater amount of WNL voice in higher pitches than lower pitches. SLP strongly urged pt to practice guitar at least 20 minutes a day and explained rationale.  09/20/22:  Pt needs CLQT and PROM. Pt upset about not finishing homework. Shanon Brow entered today after OT Flat affect. Attention targeted today in conversation. Pt held topic with WNL topic maintenance for 12 minutes, then 11 minutes. Pt is tracking his own meds correctly at this time. Does not have a pill organizer, just uses bottles. Pt cooking with wife at home - smoke alarm went off due to pt not turning stove down from searing fish. Wife stated smoke alarm is "sensitive", however SLP questions if pt should be cooking only with wife present in kitchen.    No coughing with meals/liquids. Taking meds whole with liquids now.   09/15/22: SLP targeted attention and verbal expression today in a functional task. Pt was able to sustain attention for 10 minutes in two conversational tasks. 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 Pt recalled he won bet from Monday and SLP owes him 25 cents.  09/13/22: Pt needs cognitive PROM, CLQT. Consider [place] Naming Test. Pt and wife stated pt coughed with corn chips last night so SLP assessed pt  with peanut butter crackers today without any overt s/sx aspiration or any oral stage difficulty, nor any other pharyngeal stage deficits. Pt deemed/appears safe with regular food items. Wife stated pt ate with bean dip but complained of pharyngeal stasis after coughing. SLP postulates if this was an anomalous incident as pt has not reported ANY coughing (nor wife endorsing any coughing) with POs.  Today SLP measured pt's loud /a/ with "hey" and with loud /a/ - variable loudness responses (never >73dB) between 62-73 dB. Loud /a/ averaged in upper 60s dB with usual mod A for loudness. Pt tearful about his singing voice; initially pt's voice very tremulous and this subsided over time today. Pt told to practice 10 loud /a/ BID.  Pt recalled 3/7 details about this weekend's events. Wife with excellent cues for pt's memory. Today, pt initiated sentences with 3-4 words and then he paused for 5-8 seconds of silence prior to completing sentence; 20% of the time pt abandoned sentence/utterance.  09/08/22: Pt was doing bills this morning and called about a incr in their mortgage amount, independently. SLP asked pt to [facility] and pt able to do so (and noted SLP error) without 2/4 road names (aware of one error). Pt recalled 3/5 items from today's schedule prior to ST. He did not think or attempt to write down 3 medication names. He "bet" SLP he would remember to bring his vitamin D next session. Held attention for 8 minutes prior to observation of loss of attention. At times pt stops verbal expression as if he might be experiencing anomia.  SLP to consider administering [place] (BNT-2).   09/06/22: Pt was able to hold 7-8 minute conversation with SLP today in therapy, x3. Speech volume not as loud as Friday 09/03/22, and pt's processing appeared slower today in general than on Friday 09/03/22. Pt had busy weekend and made schedule for the day with [facility] assistance. 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 Pt to cont to follow schedule to encourage brain stimulating tasks at home.  CONSIDER ADMINISTRATION OF CLQT for obtaining a baseline of pt's cognitive linguistics.  09/03/22: Pt recalled 3 iof his questions and the answers from  Dr. Naaman Plummer appointment spontaneously. SLP and wife provided the other two with min-mod A and total A. Pt recalled one answer from these two spontaneously. Pt held attention for 10 minutes in conversation about this appointment and about exercise.   08/31/22: Pt and wife deny cont'd overt s/sx aspiration with meals. Pt still does not take meds with water due to "They are bitter." [name] said pt has Dr. Naaman Plummer tomorrow so SLP had pt write down questions he has for Dr. Naaman Plummer. He thought of 2 spontaneously (5 with mod-max cues), and req'd consistent extra time to think of questions, mod A consistently for legible writing (decr'd emergent awareness), mod A usually for writing question in a format he would recall tomorrow (decr'd anticipatory awareness). [name] remarked that often she is having to encourage pt to get up from the chair to do things during the day. SLP strongly suggested a schedule and provided example of this, and also reiterated that pt will have decr'd ability to initiate tasks at this time and may require wife's cues to do daily tasks. SLP provided example of SLP calling pt's name in waiting room and him not arising until wife arises. Pt showing some incr'd awareness asking SLP if he will be able to return to things he was doing pre-CVA. 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  1/25:24: Wife asked if pt was safe for thin liquids. SLP told pt/wife that if pt took single sips and was not exhibiting coughing he could have thin liquids. SLP to assess next session. In a quiet environment, pt  completed written math task for 9 minutes prior to requiring 5 minute break, evidenced by difficulty with/inability in adding simple figures. Pt impulsive about starting back to task again and req'd SLP cueing for longer break. After 6 minutes, pt said, "Get back to this again," and re-focused on written math task- processing speed improved for 3 more entries until pt had to leave. SLP educated/highlighted to pt and wife the importance of taking breaks when he feels "stuck".   08/24/22: speech/cognition: Pt demonstrated sustained attention for 7 minutes today for therapy task of writing down reasons for each med. He demonstrated attention adequate to recall 3/5 reasons 2 minutes later, when given extra time, and required mod A with other two. He continues to perform attention lengthening tasks at home. Wife has downloaded [employer] to her phone and has directed pt to complete. Pt rec'd cont'd rationale today for his meds - he "does not like taking pills." Pt inquired reason for CVA today in session and was told by wife and SLP. Today, pt's reasoning questionable with rationale to not take med/s with pureed but pt indicated he did not enjoy the smoothies wife was making for him with meds in them.  Swallowing: SLP discussed ways to assist pt in taking meds as they are currently being crushed and put into a smoothie for pt; "Do I have to have the smoothies" pt inquired. SLP suggested meds with applesauce, and yogurt, one pureed item at a time, and pt declined each time. SLP, pt, and wife eventually agreed pt may attempt one med with pureed item until next session.   PATIENT EDUCATION: Education details: see "today's treatment" Person educated: Patient and Spouse Education method: Explanation Education comprehension: verbalized understanding and needs further education   GOALS: Goals reviewed with patient? No  SHORT TERM GOALS: Target date: 2/9/2, 09/27/22 (revised 2/12/224)  Pt will  follow  aspiration precautions from FEES with occasional min A in 2 sessions Baseline: Goal status: deferred - pt not coughing with POs  2.  Jermyn will demo sustained attention to participate in 10 minute therapy task x3/session, in 3 sessions Baseline: 09/03/22, 09-20-22, 09/22/22 Goal status: met  3.  Pt will demo understanding of written cues in simple therapy tasks, with initial cue allowed, in 3 sessions Baseline: 08-19-22, 09-03-22 Goal status: Met  4.  Pt will demo selective attention for 10 minutes to a simple therapy task in a min-mod noisy environment Baseline:  Goal status: Not met  5.  Pt/wife will tell SLP 3 overt s/sx aspiration PNA with modified independence Baseline:  Goal status: Deferred - pt  no longer couhging with POs  6   Using compensations, pt will ask wife to give him his medications, at least once/day between 3 sessions Baseline:  Goal status: Met   LONG TERM GOALS: Target date: 11/09/22  Pt will follow aspiration precautions from FEES with rare min A in 3 sessions Baseline:  Goal status: Deferred   2.  Pt will follow aspiration precautions from FEES with modified independence in 2 sessions Baseline:  Goal status: deferred  3.  Pt will demo selective attention for 15 minutes to a simple therapy task in a min-mod noisy environment Baseline:  Goal status: Modified  4.  Pt will demo emergent awareness in simple therapy tasks with verbal cue for error awareness in 3 sessions Baseline:  Goal status: Ongoing  5.  Pt will demo alternating attention for 15 minutes to complete simple-mod complex therapy tasks 100% accuracy given min cues, in 3 sessions Baseline:  Goal status: Modified  6.  Using compensations, pt will recall to administer his own medications correctly, at least once/day between 3 sessions Baseline:  Goal status: Ongoing  7.  Wife's PROM (due to pt's attention deficit) score for pt will improve in last 1-2 sessions than the score taken in the first  1-2 sessions Baseline:  Goal status: Ongoing  ASSESSMENT:  CLINICAL IMPRESSION: Modified two LTGs for attention based upon pt difficulty thus far with progress with attention. Patient is a 72 y.o. male who was seen today for treatment of simple cognition (primarily attention) in light of CVA 07-17-22. Pt swallowing is WNL, is now tracking his own meds correctly. .   OBJECTIVE IMPAIRMENTS: include attention, memory, awareness, executive functioning, receptive language, and dysphagia. These impairments are limiting patient from return to work, managing medications, managing appointments, managing finances, household responsibilities, ADLs/IADLs, effectively communicating at home and in community, and safety when swallowing. Factors affecting potential to achieve goals and functional outcome are ability to learn/carryover information, cooperation/participation level, and severity of impairments.. Patient will benefit from skilled SLP services to address above impairments and improve overall function.  REHAB POTENTIAL: Good  PLAN:  SLP FREQUENCY: 2x/week  SLP DURATION: 12 weeks  PLANNED INTERVENTIONS: Aspiration precaution training, Pharyngeal strengthening exercises, Diet toleration management , Environmental controls, Trials of upgraded texture/liquids, Cueing hierachy, Cognitive reorganization, Internal/external aids, Multimodal communication approach, SLP instruction and feedback, Compensatory strategies, and Patient/family education    [facility], [place] 09/30/2022, 12:47 PM

## 2022-09-30 NOTE — Therapy (Signed)
OUTPATIENT PHYSICAL THERAPY NEURO TREATMENT NOTE   Patient Name: Douglas Cox MRN: [id number] DOB:07-22-1951, 72 y.o., male Today's Date: 10/01/2022   PCP: Berneta Levins, DO REFERRING PROVIDER: Meredith Staggers, MD   END OF SESSION:  PT End of Session - 09/30/22 1027     Visit Number 2    Number of Visits 12    Date for PT Re-Evaluation 11/05/22    Authorization Type [employer]    Progress Note Due on Visit 10    PT Start Time 1024    PT Stop Time 1104    PT Time Calculation (min) 40 min    Activity Tolerance Patient tolerated treatment well    Behavior During Therapy Flat affect;WFL for tasks assessed/performed             Past Medical History:  Diagnosis Date   Celiac disease    Retinal detachment    Past Surgical History:  Procedure Laterality Date   CATARACT EXTRACTION Bilateral    Dr. Valetta Close   EYE SURGERY     FRACTURE SURGERY     GAS INSERTION Right 05/21/2021   Procedure: INSERTION OF GAS - C3F8;  Surgeon: Bernarda Caffey, MD;  Location: [place];  Service: Ophthalmology;  Laterality: Right;   GAS/FLUID EXCHANGE Right 05/21/2021   Procedure: GAS/FLUID EXCHANGE;  Surgeon: Bernarda Caffey, MD;  Location: [place];  Service: Ophthalmology;  Laterality: Right;   HERNIA REPAIR     IR CT HEAD LTD  07/17/2022   IR PERCUTANEOUS ART THROMBECTOMY/INFUSION INTRACRANIAL INC DIAG ANGIO  07/17/2022   IR US GUIDE VASC ACCESS RIGHT  07/17/2022   PARS PLANA VITRECTOMY Right 05/21/2021   Procedure: PARS PLANA VITRECTOMY WITH 25 GAUGE;  Surgeon: Bernarda Caffey, MD;  Location: [place];  Service: Ophthalmology;  Laterality: Right;   PERFLUORONE INJECTION Right 05/21/2021   Procedure: PERFLUORONE INJECTION;  Surgeon: Bernarda Caffey, MD;  Location: [place];  Service: Ophthalmology;  Laterality: Right;   PHOTOCOAGULATION WITH LASER Right 05/21/2021   Procedure: PHOTOCOAGULATION WITH LASER;  Surgeon: Bernarda Caffey, MD;  Location: [place];  Service: Ophthalmology;  Laterality: Right;    RADIOLOGY WITH ANESTHESIA N/A 07/17/2022   Procedure: IR WITH ANESTHESIA;  Surgeon: Radiologist, Medication, MD;  Location: [place];  Service: Radiology;  Laterality: N/A;   RETINAL DETACHMENT SURGERY     Patient Active Problem List   Diagnosis Date Noted   Paroxysmal atrial fibrillation ([place]) 09/01/2022   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 ([place]) 07/22/2022   Protein-calorie malnutrition, severe 07/19/2022   Stroke (cerebrum) ([place]) 07/17/2022   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   Celiac disease    Alcohol dependence ([place])    Bradycardia     ONSET DATE: 09/20/2022 (MD referral)  REFERRING DIAG:  Diagnosis  I63.81 (ICD-10-CM) - 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 ([place])    THERAPY DIAG:  Muscle weakness (generalized)  Abnormal posture  Unsteadiness on feet  Other abnormalities of gait and mobility  Rationale for Evaluation and Treatment: Rehabilitation  SUBJECTIVE:  SUBJECTIVE STATEMENT: Hips are still hurting.  Wife says low back was bothering him yesterday.  Pool felt good and didn't hurt.  Wife report MD has given Tramadol and that has helped.  Didn't like the ice massage, but did use heat and liked that better. Pt accompanied by: significant other  PERTINENT HISTORY: Mediastinal mass, bradycardia; basal ganglia CVA from MD notes, distant hx of A-fib, celiac disease, retinal detachment, HLD, HTN, sleep apnea.   From hospital notes:   Presented to ED w/ acute onset of left-sided weakness as well as right frontal headache.  Admitted to [facility] on 07/17/2022.  See imaging.   PAIN:   Are you having pain? No  *Reports Pain at night in bilateral hips 2/10.  Sleeps on side.  Alleviates pain by straightening legs.  PRECAUTIONS: Fall  WEIGHT BEARING RESTRICTIONS:  No  FALLS: Has patient fallen in last 6 months? Yes. Number of falls 1  LIVING ENVIRONMENT: Lives with: lives with their spouse Lives in: House/apartment Stairs: Yes: Internal: 14 steps; on right going up and External: 14 steps; on left going up Has following equipment at home: Single point cane  PLOF: Independent with basic ADLs and Independent with gait  Enjoyed hiking 2-3 miles, foraging for mushrooms.  Joined the [facility] and has been swimming twice.  *Since CVA, mostly sits or lies on sofa during the day.  PATIENT GOALS: Pt's goals for therapy are to make pain stop, to get back to outdoor activities.  Wife's goal to pick up feet more.  OBJECTIVE:    TODAY'S TREATMENT: 09/30/2022 Activity Comments  Sit<>Stand 2 x 5 reps from chair, min UE support Cues for eccentric control to sit  Reviewed HEP, with pt return demo understanding   Supine lumbar stretch: -SKTC, 3 x 15" -lower trunk rotation, 3 x 15" No c/o pain, cues for technique, hold time  Alt step taps to 6" step, then 12" step x 10 each Light BUE support  Hip abduction standing 2 x 10 Cues to slow pace, for brief hold time at end range  Wide BOS lateral weigthshifting>progress to weightshift and lift/reach Light UE support  Brief turn practice using weightshifting, wide BOS to rock and turn Cues needed    PATIENT EDUCATION: Education details: HEP additions, rationale for stretching, strengthening to help to alleviate pain at hips (?IT band tightness versus bursitis where pain is more at greater trochanter) Person educated: Patient and Spouse Education method: Explanation, Demonstration, Verbal cues, and Handouts Education comprehension: verbalized understanding, returned demonstration, and needs further education  Access Code: [facility] URL: [email or web address] Date: 09/30/2022 Prepared by: [facility]  Exercises - Seated Hamstring Stretch  - 2 x daily - 7 x weekly - 1  sets - 3 reps - 30 sec hold - Seated Figure 4 Piriformis Stretch  - 1-2 x daily - 7 x weekly - 1 sets - 3 reps - 30 sec hold - Lower Trunk Rotations  - 1-2 x daily - 7 x weekly - 1 sets - 5 reps - 15-30 sec hold - Hooklying Single Knee to Chest Stretch  - 1-2 x daily - 7 x weekly - 1 sets - 3-5 reps - 15-30 hold - Side to side weightshift  - 1-2 x daily - 5 x weekly - 2 sets - 10 reps -------------------------------------------------------------------------------------- (Objective measures below taken at initial evaluation:)  DIAGNOSTIC FINDINGS:  (From rehab discharge note) CT scan showed loss of gray-white differentiation in the right insular cortex  lateral aspect of the right lentiform and right temporal tip consistent with acute infarction.  No acute hemorrhage.  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  Status post TNK as well as recanalization per interventional radiology.  MRI follow-up showed acute/subacute nonhemorrhagic infarct of the right caudate head and lentiform nucleus as noted on prior previous CT.   COGNITION: Overall cognitive status:  See speech notes; flat affect at eval   SENSATION: Light touch: WFL   MUSCLE LENGTH:  Passive from 90/90 position in supine Hamstrings: Right -30 deg; Left -40 deg   POSTURE: rounded shoulders, forward head, posterior pelvic tilt, and chooses to sit with posterior pelvic tilt, leaned back against wall.  LOWER EXTREMITY ROM:   Tightness in hamstrings, tenderness to palpation along superior aspect of IT band, especially greater trochanter; tightness at hip flexors in sidelying  LOWER EXTREMITY MMT:    MMT Right Eval Left Eval  Hip flexion 4+ 4  Hip extension    Hip abduction 3+ 4  Hip adduction 4 4  Hip internal rotation    Hip external rotation    Knee flexion 4 3+  Knee extension 4 4  Ankle dorsiflexion 3+ 3+  Ankle plantarflexion    Ankle inversion    Ankle eversion    (Blank rows = not  tested)  BED MOBILITY:  Independent, extra time  TRANSFERS: Assistive device utilized: None  Sit to stand: Modified independence Stand to sit: Modified independence  STAIRS: Level of Assistance: SBA Stair Negotiation Technique: Alternating Pattern  with Bilateral Rails Number of Stairs: 2-3  Height of Stairs: 4"-6"  Comments: Decreased foot clearance  GAIT: Gait pattern:  veers to R with gait challenges, step through pattern, decreased ankle dorsiflexion- Right, decreased ankle dorsiflexion- Left, genu recurvatum- Left, wide BOS, poor foot clearance- Right, and poor foot clearance- Left Distance walked: 50 ft x 4 Assistive device utilized: None Level of assistance: SBA Comments: veers to R with balance challenges  FUNCTIONAL TESTS:  10 meter walk test: 11.78 sec (2.78 ft/sec) self-selected; FAST:  8.47 sec = 3.87 ft/sec Functional gait assessment: 14/30 (scores <22/30 indicate increased fall risk); score has decreased from 17/30 at initial eval in hospital    PATIENT SURVEYS:  NA   M-CTSIB  Condition 1: Firm Surface, EO 30 Sec, Normal Sway  Condition 2: Firm Surface, EC 30 Sec, Mild Sway  Condition 3: Foam Surface, EO 30 Sec, Mild Sway  Condition 4: Foam Surface, EC 30 Sec, Mild Sway     GOALS: Goals reviewed with patient? Yes  SHORT TERM GOALS: Target date: 10/08/2022  Pt will be supervision with HEP for improved flexibility, strength, balance, and gait. Baseline: Goal status: IN PROGRESS  2.  Pt will report pain in hips at night <5/10, for improved sleep, bed mobility positioning. Baseline: 10/10 at night, interferes with sleep Goal status: IN PROGRESS   LONG TERM GOALS: Target date: 11/05/2022  Pt will be independent with HEP for improved balance, flexibility, strength, gait. Baseline:  Goal status: IN PROGRESS  2.  Pt will improve FGA score to at least 20/30 to decrease fall risk. Baseline: 14/30 Goal status: IN PROGRESS  3.  Pt will improve gait  velocity to at least 3 ft/sec self-selected speed. Baseline: 2.78 ft/sec Goal status: IN PROGRESS  4.  Pt will ambulate at least 1000 ft, indoor and outdoor surfaces, modified independently for return to outdoor gait hiking. Baseline:  Goal status: IN PROGRESS    ASSESSMENT:  CLINICAL IMPRESSION: Skilled PT session today focused on review of HEP.  Pt reports less pain rating in hips; however, he notes increased back pain that was preventing sleep since PT evaluation.  Addressed this with gentle lumbar stretches in session today and added to HEP.  Pt does not have any c/o pain with stretching in session.  Progressed to gentle standing hip abductor strengthening and SLS work, without c/o.  Pt will continue to benefit from skilled PT towards goals for improved overall functional mobility and return to PLOF.     OBJECTIVE IMPAIRMENTS: Abnormal gait, decreased balance, decreased knowledge of use of DME, decreased mobility, difficulty walking, decreased ROM, decreased strength, impaired flexibility, and pain.   ACTIVITY LIMITATIONS: standing, sleeping, transfers, bed mobility, and locomotion level  PARTICIPATION LIMITATIONS: community activity and fishing, hiking  PERSONAL FACTORS: 3+ comorbidities: see above  are also affecting patient's functional outcome.   REHAB POTENTIAL: Good  CLINICAL DECISION MAKING: Evolving/moderate complexity  EVALUATION COMPLEXITY: Moderate  PLAN:  PT FREQUENCY: 2x/week  PT DURATION: 6 weeks (including eval week)  PLANNED INTERVENTIONS: Therapeutic exercises, Therapeutic activity, Neuromuscular re-education, Balance training, Gait training, Patient/Family education, Self Care, Joint mobilization, Stair training, DME instructions, Cryotherapy, and Manual therapy  PLAN FOR NEXT SESSION: Review HEP.  Ask about ice massage/pain; Continue work on hip strengthening, piriformis and IT band stretch; balance and gait training.   Frazier Butt., PT 10/01/2022,  7:35 AM  [facility] at [facility], [place], [place] [address] Phone # [phone] Fax # [phone]

## 2022-10-01 ENCOUNTER — Ambulatory Visit: Payer: Medicare HMO | Attending: Physician Assistant | Admitting: Occupational Therapy

## 2022-10-01 DIAGNOSIS — R1312 Dysphagia, oropharyngeal phase: Secondary | ICD-10-CM | POA: Diagnosis present

## 2022-10-01 DIAGNOSIS — R293 Abnormal posture: Secondary | ICD-10-CM | POA: Diagnosis present

## 2022-10-01 DIAGNOSIS — M6281 Muscle weakness (generalized): Secondary | ICD-10-CM | POA: Insufficient documentation

## 2022-10-01 DIAGNOSIS — I69254 Hemiplegia and hemiparesis following other nontraumatic intracranial hemorrhage affecting left non-dominant side: Secondary | ICD-10-CM | POA: Insufficient documentation

## 2022-10-01 DIAGNOSIS — R41841 Cognitive communication deficit: Secondary | ICD-10-CM | POA: Insufficient documentation

## 2022-10-01 DIAGNOSIS — R2681 Unsteadiness on feet: Secondary | ICD-10-CM | POA: Diagnosis present

## 2022-10-01 DIAGNOSIS — I69318 Other symptoms and signs involving cognitive functions following cerebral infarction: Secondary | ICD-10-CM | POA: Diagnosis present

## 2022-10-01 DIAGNOSIS — R2689 Other abnormalities of gait and mobility: Secondary | ICD-10-CM | POA: Insufficient documentation

## 2022-10-01 DIAGNOSIS — R41840 Attention and concentration deficit: Secondary | ICD-10-CM | POA: Diagnosis present

## 2022-10-01 NOTE — Therapy (Signed)
OUTPATIENT OCCUPATIONAL THERAPY  Treatment Note  Patient Name: Douglas Cox MRN: [id number] DOB:01/20/1951, 72 y.o., male Today's Date: 10/01/2022  PCP: Dr. Zara Council ([facility]) REFERRING PROVIDER: Charlett Blake, MD   END OF SESSION:  OT End of Session - 10/01/22 1130     Visit Number 11    Number of Visits 23    Date for OT Re-Evaluation 11/05/22    Authorization Type [employer]    OT Start Time 1102    OT Stop Time 1144    OT Time Calculation (min) 42 min    Activity Tolerance Patient tolerated treatment well    Behavior During Therapy Flat affect;Restless                       Past Medical History:  Diagnosis Date   Celiac disease    Retinal detachment    Past Surgical History:  Procedure Laterality Date   CATARACT EXTRACTION Bilateral    Dr. Valetta Close   EYE SURGERY     FRACTURE SURGERY     GAS INSERTION Right 05/21/2021   Procedure: INSERTION OF GAS - C3F8;  Surgeon: Bernarda Caffey, MD;  Location: [place];  Service: Ophthalmology;  Laterality: Right;   GAS/FLUID EXCHANGE Right 05/21/2021   Procedure: GAS/FLUID EXCHANGE;  Surgeon: Bernarda Caffey, MD;  Location: [place];  Service: Ophthalmology;  Laterality: Right;   HERNIA REPAIR     IR CT HEAD LTD  07/17/2022   IR PERCUTANEOUS ART THROMBECTOMY/INFUSION INTRACRANIAL INC DIAG ANGIO  07/17/2022   IR US GUIDE VASC ACCESS RIGHT  07/17/2022   PARS PLANA VITRECTOMY Right 05/21/2021   Procedure: PARS PLANA VITRECTOMY WITH 25 GAUGE;  Surgeon: Bernarda Caffey, MD;  Location: [place];  Service: Ophthalmology;  Laterality: Right;   PERFLUORONE INJECTION Right 05/21/2021   Procedure: PERFLUORONE INJECTION;  Surgeon: Bernarda Caffey, MD;  Location: [place];  Service: Ophthalmology;  Laterality: Right;   PHOTOCOAGULATION WITH LASER Right 05/21/2021   Procedure: PHOTOCOAGULATION WITH LASER;  Surgeon: Bernarda Caffey, MD;  Location: [place];  Service: Ophthalmology;  Laterality: Right;   RADIOLOGY WITH ANESTHESIA N/A  07/17/2022   Procedure: IR WITH ANESTHESIA;  Surgeon: Radiologist, Medication, MD;  Location: [place];  Service: Radiology;  Laterality: N/A;   RETINAL DETACHMENT SURGERY     Patient Active Problem List   Diagnosis Date Noted   Paroxysmal atrial fibrillation ([place]) 09/01/2022   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 ([place]) 07/22/2022   Protein-calorie malnutrition, severe 07/19/2022   Stroke (cerebrum) ([place]) 07/17/2022   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   Celiac disease    Alcohol dependence ([place])    Bradycardia     ONSET DATE: 08/04/22 (referral date)  REFERRING DIAG: I63.81 (ICD-10-CM) - Other cerebral infarction due to occlusion or stenosis of small artery  THERAPY DIAG:  Attention and concentration deficit  Muscle weakness (generalized)  Other symptoms and signs involving cognitive functions following cerebral infarction  Rationale for Evaluation and Treatment: Rehabilitation  SUBJECTIVE:   SUBJECTIVE STATEMENT: Pt reports that he joined the [employer] as their pool water and deck is warmer.   Pt accompanied by: self and significant other (spouse)  PERTINENT HISTORY: 72 y.o. male with a PMHx of cataracts, celiac disease and retinal detachment who presents to the ED with acute onset of left hemiplegia, right gaze deviation and left hemineglect.MRI: Acute/subacute nonhemorrhagic infarct of the right caudate head and lentiform nucleus; acute/subacute nonhemorrhagic infarct of the left caudate head and lentiform nucleus spanning the  internal capsule; Acute/subacute nonhemorrhagic infarct of the cortex of the right temporal tip.  PRECAUTIONS: Fall  WEIGHT BEARING RESTRICTIONS: No  PAIN:  Are you having pain? No  FALLS: Has patient fallen in last 6 months? Yes. Number of falls 2 - 1 when he had the stroke he fell forward and hit his head and fell off a ladder  LIVING ENVIRONMENT: Lives with: lives with their spouse Lives in: House/apartment Stairs: Yes:  Internal: 16 steps; on right going up and External: 4 steps to front door, 16 steps from garage to main floor of home steps; on right going up Has following equipment at home: Single point cane  PLOF: Independent, working as an Pension scheme manager  PATIENT GOALS: to improve thought process, speech  OBJECTIVE:   HAND DOMINANCE: Right  ADLs: Overall ADLs: Independent with all bathing/dressing.    IADLs: Shopping: Not interested in going to the store, but would have gone prior to CVA Light housekeeping: is able to assist with household tasks when spouse asks for assistance or directs him to complete Meal Prep: Spouse is proving supervision for meal prep, but has been able to cook fish Community mobility: MD has not cleared pt to drive Medication management: Spouse administers medication, is having difficulty with administering B12 shot Financial management: Spouse is currently paying bills, but pt would have completed prior to CVA   COORDINATION: Finger Nose Finger test: mild ataxia, dysmetria LUE 9 Hole Peg test: Right: 32.37 sec; Left: 50.04 sec  Noted some difficulty with sequencing, requiring cues for recall and sequencing of directions Box and Blocks:  Right 38 blocks, Left 40 blocks (Pt frequently picking up 2-3 blocks at a time despite mod-max cues to pick up only one block at a time.    COGNITION: Overall cognitive status: Impaired Orientation Level: Person;Place;Situation Person: Oriented Place: Disoriented Situation: Disoriented Memory: Impaired Memory Impairment: Storage deficit;Retrieval deficit;Decreased recall of new information;Decreased short term memory Attention: Focused Awareness: Impaired Awareness Impairment: Intellectual impairment Problem Solving: Impaired Problem Solving Impairment: Verbal basic;Functional basic Behaviors: Restless;Impulsive Safety/Judgment: Impaired  OBSERVATIONS: Pt demonstrating attention at focused level during 9 hole peg test, requiring  frequent cues and redirection to attend to 2 step task with Box and Blocks.  Pt demonstrating decreased interest and motivation during OT evaluation, however pt and spouse agreeable to continue with OT at this time to focus on memory and return to engagement in IADLs and leisure pursuits.  TODAY'S TREATMENT:                                  10/01/22 Attention: engaged in peg board pattern replication while challenging pt to name songs by favorite artists.  Pt demonstrating ability to name 2-3 songs by Grateful Dead before requiring cues to identify additional 2-3 more.  Pt able to name [place] with significantly increased time.  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  Pt demonstrating impulsivity with placing of pegs, requiring gesture cues and removal of additional stimulus to allow pt to focus on naming of items while placing pegs.  Attention: modified activity to completing 12 piece jigsaw puzzle while singing along to familiar songs.  Pt demonstrating ability to sustain attention to task, however requiring ~4 mins to complete 12 piece puzzle.  Encouraged pt to name additional musical artists in this genre, with pt able to name up to 5  with increased time.   09/22/22 Pill box assessment: Completed in 5:14. At 5 min time limit pt had completed all except 4 while administering last prescription.  Of note, pt initially starting medication in vertical method instead of linear, requiring need to start over which took some time, but demonstrated good awareness.  Pt demonstrating improved sequencing and following of prescription instructions compared to previous session.  Pt reports that he is taking his medications as prescribed, will occasionally ask spouse to confirm prior to taking.  Trail making B: completed per spouse request with focus on amount of cues and timing.  OT terminated task at 4:02 as pt with 2 errors and beginning to voice  increased frustration.  Pt able to complete to 10J with one error unrecognized and one error corrected with no cues from pt or spouse.  OT educated on functional carryover of alternating attention to meal prep with multiple items to cook and with playing guitar while signing.      09/20/22 Attention: engaged in card game (speed) with focus on selective attention and speed.  Pt requiring increased time with sequencing and min cues for attention to task with cues for problem solving when stuck.  Pt demonstrating min increased ease with sequencing and speed during second bout through card game.  Discussed ability to upgrade/downgrade aspects of game to meet attention level. Phone use: OT challenging pt to locate phone number of significant other to call.  Pt initially opening incorrect apps x2, but then able to locate SO in both text messages and contacts with significantly increased time.  Reviewed multiple avenues to locate contacts in phone.  Pt easily distracted by other apps, requiring increased time to recognize in incorrect app and attempt to correct.     PATIENT EDUCATION: Education details: Educated on Estate agent during daily tasks Person educated: Patient and Spouse Education method: Explanation Education comprehension: verbalized understanding and needs further education  HOME EXERCISE PROGRAM: TBD   GOALS: Goals reviewed with patient? Yes  SHORT TERM GOALS: Target date: 10/15/22  Pt will be able to identify/verbalize steps and/or items needed to complete simple functional task (simple snack prep, laundry task, etc) with supervision. Baseline: Goal status: IN PROGRESS  2.  Patient will be able to maintain alternating attention for 2 mins during structured task without additional cues for attention.  Baseline:  Goal status: IN PROGRESS  3.  Pt will be able to utilize phone to locate contacts and make emergency calls as needed without cues in <90  seconds. Baseline:  Goal status: IN PROGRESS   LONG TERM GOALS: Target date: 11/05/22  Pt will demonstrate and/or verbalize ability to sequence advanced meal prep task (to include having all meal items completed at same time) at Mod I level. Baseline:  Goal status: IN PROGRESS  2.  Pt will navigate a moderately busy environment, following multi-step commands with 90% accuracy to simulate return to community reintegration. Baseline:  Goal status: IN PROGRESS  3.  Pt will be able to utilize phone to look up phone number for physician and/or other frequently used number/location without cues in <90 seconds. Baseline:  Goal status: IN PROGRESS  4.  Pt will be able to verbalize wayfinding/directions from home to familiar locations in the area without cues. Baseline:  Goal status: IN PROGRESS  5.  Pt will demonstrate ability to maintain selective attention for 5 mins during structured task without additional cues for attention.   Baseline:  Goal status: IN PROGRESS    ASSESSMENT:  CLINICAL IMPRESSION: Pt demonstrating ability to sustain attention to task while listening to music, however requiring increased time but no cues for attention to task.  Pt demonstrating initial impulsivity with peg board pattern replication requiring external cues to slow down and complete selective aspect of task. OT encouraging pt to incorporate music into daily tasks to challenge attention as well as allow for increased vocal intensity over music.  PERFORMANCE DEFICITS: in functional skills including IADLs, coordination, Fine motor control, Gross motor control, body mechanics, endurance, decreased knowledge of precautions, decreased knowledge of use of DME, and UE functional use, cognitive skills including attention, energy/drive, memory, orientation, problem solving, safety awareness, sequencing, and temperament/personality, and psychosocial skills including coping strategies, habits, and routines and  behaviors.   IMPAIRMENTS: are limiting patient from IADLs and leisure.   CO-MORBIDITIES: may have co-morbidities  that affects occupational performance. Patient will benefit from skilled OT to address above impairments and improve overall function.  MODIFICATION OR ASSISTANCE TO COMPLETE EVALUATION: Min-Moderate modification of tasks or assist with assess necessary to complete an evaluation.  OT OCCUPATIONAL PROFILE AND HISTORY: Detailed assessment: Review of records and additional review of physical, cognitive, psychosocial history related to current functional performance.  CLINICAL DECISION MAKING: Moderate - several treatment options, min-mod task modification necessary  REHAB POTENTIAL: Good  EVALUATION COMPLEXITY: Moderate    PLAN:  OT FREQUENCY: 1-2x/week  OT DURATION: 6 weeks  PLANNED INTERVENTIONS: self care/ADL training, therapeutic exercise, therapeutic activity, balance training, functional mobility training, patient/family education, cognitive remediation/compensation, psychosocial skills training, energy conservation, coping strategies training, and DME and/or AE instructions  RECOMMENDED OTHER SERVICES: NA  CONSULTED AND AGREED WITH PLAN OF CARE: Patient and family member/caregiver  PLAN FOR NEXT SESSION: selective attention - functional tasks with providing directions, making phone calls, preparing simple meal.  Complex/simple organization/planning.  Dual tasking with playing guitar while singing, and/or mental math to complete scheduling items and ensuring meal prep in timely manner (all items completed around same time).   Marguis Mathieson, [place], OTR/L 10/01/2022, 11:30 AM

## 2022-10-02 ENCOUNTER — Other Ambulatory Visit (HOSPITAL_COMMUNITY): Payer: Self-pay

## 2022-10-04 ENCOUNTER — Other Ambulatory Visit: Payer: Self-pay

## 2022-10-04 ENCOUNTER — Encounter: Payer: Self-pay | Admitting: Physical Therapy

## 2022-10-04 ENCOUNTER — Other Ambulatory Visit (HOSPITAL_COMMUNITY): Payer: Self-pay

## 2022-10-04 ENCOUNTER — Ambulatory Visit: Payer: Medicare HMO | Admitting: Physical Therapy

## 2022-10-04 ENCOUNTER — Encounter (HOSPITAL_COMMUNITY): Payer: Self-pay

## 2022-10-04 DIAGNOSIS — R41840 Attention and concentration deficit: Secondary | ICD-10-CM | POA: Diagnosis not present

## 2022-10-04 DIAGNOSIS — M6281 Muscle weakness (generalized): Secondary | ICD-10-CM

## 2022-10-04 DIAGNOSIS — R2681 Unsteadiness on feet: Secondary | ICD-10-CM

## 2022-10-04 DIAGNOSIS — R293 Abnormal posture: Secondary | ICD-10-CM

## 2022-10-04 NOTE — Progress Notes (Signed)
Douglas Cox    [id number]    1950/10/15  Primary Care Physician:Combs, Diona Browner, DO  Referring Physician: No referring provider defined for this encounter.   Chief complaint: Abnormal PET/CT, mediastinal mass, esophageal thickening No chief complaint on file.   HPI:  72 year old very pleasant gentleman with history of celiac disease here to discuss EGD for follow-up of abnormal findings on CT  I last saw him on 06/28/22. At that time he was undergoing treatment for retinal detachment and had deferred EGD until he completed the treatment regimen for it. He reported being on a strict gluten-free and lactose-free diet. He complained from hemorrhoids, discomfort and intermittent bright red blood per rectum.   Today,    GI Hx  He has 2.8 X1.9X 6.5 cm anterior mediastinal lesion, likely complex cyst based on PET/CT 04/24/21.   He had mild diffuse thickening of esophagus on CT angio chest abdomen pelvis in August 2022 but subsequent imaging in September were negative for any mass lesion or esophageal thickening.  No hiatal hernia and ingested barium tablet passed freely through the esophagus.     Patient moved here 2 to 3 years ago from [place] EGD and colonoscopy in [place] 5-6 years ago were unremarkable according to patient, reports not available to review during this visit   Current Outpatient Medications:    apixaban (ELIQUIS) 5 MG TABS tablet, Take 1 tablet (5 mg total) by mouth 2 (two) times daily., Disp: 60 tablet, Rfl: 1   Cyanocobalamin (VITAMIN B-12 IJ), Inject as directed., Disp: , Rfl:    famotidine (PEPCID) 20 MG tablet, Take 1 tablet (20 mg total) by mouth 2 (two) times daily., Disp: 90 tablet, Rfl: 4   magnesium oxide (MAG-OX) 400 (240 Mg) MG tablet, Take 0.5 tablets (200 mg total) by mouth at bedtime., Disp: 30 tablet, Rfl: 5   Probiotic Product (ALIGN) 4 MG CAPS, , Disp: , Rfl:    rosuvastatin (CRESTOR) 20 MG tablet, Take 1 tablet (20 mg  total) by mouth daily., Disp: 30 tablet, Rfl: 3   SYRINGE-NEEDLE, DISP, 3 ML (B-D 3CC LUER-LOK SYR 25GX1") 25G X 1" 3 ML MISC, Use as directed for B12 injections every 2 weeks, Disp: 30 each, Rfl: 0   traMADol (ULTRAM) 50 MG tablet, Take 1 tablet (50 mg total) by mouth every 6 (six) hours as needed for pain, Disp: 20 tablet, Rfl: 0   vitamin D3 (CHOLECALCIFEROL) 25 MCG tablet, Take 1 tablet (1,000 Units total) by mouth daily., Disp: 30 tablet, Rfl: 5    Allergies as of 10/06/2022 - Review Complete 10/04/2022  Allergen Reaction Noted   Nitrous oxide Other (See Comments) 05/21/2021   Codone [hydrocodone] Hives 01/30/2018   Tylenol [acetaminophen] Hives 07/08/2021   Hydrocodone-acetaminophen  08/31/2022   Oxycodone hcl  08/31/2022   Clarithromycin Anxiety 01/30/2018   Gluten meal Other (See Comments) 07/19/2022   Lactose intolerance (gi) Other (See Comments) 07/19/2022    Past Medical History:  Diagnosis Date   Celiac disease    Retinal detachment     Past Surgical History:  Procedure Laterality Date   CATARACT EXTRACTION Bilateral    Dr. Valetta Close   EYE SURGERY     FRACTURE SURGERY     GAS INSERTION Right 05/21/2021   Procedure: INSERTION OF GAS - C3F8;  Surgeon: Bernarda Caffey, MD;  Location: [place];  Service: Ophthalmology;  Laterality: Right;   GAS/FLUID EXCHANGE Right 05/21/2021   Procedure: GAS/FLUID EXCHANGE;  Surgeon: Bernarda Caffey, MD;  Location: [place];  Service: Ophthalmology;  Laterality: Right;   HERNIA REPAIR     IR CT HEAD LTD  07/17/2022   IR PERCUTANEOUS ART THROMBECTOMY/INFUSION INTRACRANIAL INC DIAG ANGIO  07/17/2022   IR US GUIDE VASC ACCESS RIGHT  07/17/2022   PARS PLANA VITRECTOMY Right 05/21/2021   Procedure: PARS PLANA VITRECTOMY WITH 25 GAUGE;  Surgeon: Bernarda Caffey, MD;  Location: [place];  Service: Ophthalmology;  Laterality: Right;   PERFLUORONE INJECTION Right 05/21/2021   Procedure: PERFLUORONE INJECTION;  Surgeon: Bernarda Caffey, MD;  Location: [place];   Service: Ophthalmology;  Laterality: Right;   PHOTOCOAGULATION WITH LASER Right 05/21/2021   Procedure: PHOTOCOAGULATION WITH LASER;  Surgeon: Bernarda Caffey, MD;  Location: [place];  Service: Ophthalmology;  Laterality: Right;   RADIOLOGY WITH ANESTHESIA N/A 07/17/2022   Procedure: IR WITH ANESTHESIA;  Surgeon: Radiologist, Medication, MD;  Location: [place];  Service: Radiology;  Laterality: N/A;   RETINAL DETACHMENT SURGERY      No family history on file.  Social History   Socioeconomic History   Marital status: Married    Spouse name: Not on file   Number of children: Not on file   Years of education: Not on file   Highest education level: Not on file  Occupational History   Not on file  Tobacco Use   Smoking status: Former    Types: Cigarettes    Quit date: 01/30/2006    Years since quitting: 16.6   Smokeless tobacco: Never  Vaping Use   Vaping Use: Never used  Substance and Sexual Activity   Alcohol use: Not Currently    Alcohol/week: 4.0 - 5.0 standard drinks of alcohol    Types: 4 - 5 Shots of liquor per week    Comment: 3 glasses of whiskey per night.   Drug use: Not Currently    Frequency: 7.0 times per week    Types: Marijuana   Sexual activity: Not Currently  Other Topics Concern   Not on file  Social History Narrative   Not on file   Social Determinants of Health   Financial Resource Strain: Not on file  Food Insecurity: No Food Insecurity (07/21/2022)   Hunger Vital Sign    Worried About Running Out of Food in the Last Year: Never true    Ran Out of Food in the Last Year: Never true  Transportation Needs: No Transportation Needs (07/21/2022)   PRAPARE - Hydrologist (Medical): No    Lack of Transportation (Non-Medical): No  Physical Activity: Not on file  Stress: Not on file  Social Connections: Not on file  Intimate Partner Violence: Not At Risk (07/21/2022)   Humiliation, Afraid, Rape, and Kick questionnaire    Fear of  Current or Ex-Partner: No    Emotionally Abused: No    Physically Abused: No    Sexually Abused: No      Review of systems: Review of Systems    Physical Exam: General: well-appearing ***  Eyes: sclera anicteric, no redness ENT: oral mucosa moist without lesions, no cervical or supraclavicular lymphadenopathy CV: RRR, no JVD, no peripheral edema Resp: clear to auscultation bilaterally, normal RR and effort noted GI: soft, no tenderness, with active bowel sounds. No guarding or palpable organomegaly noted. Skin; warm and dry, no rash or jaundice noted Neuro: awake, alert and oriented x 3. Normal gross motor function and fluent speech   Data Reviewed:  Reviewed labs, radiology imaging,  old records and pertinent past GI work up   Assessment and Plan/Recommendations:  72 year old very pleasant gentleman with history of celiac disease,  abnormal CT with mediastinal mass and esophageal wall thickening  Schedule for EGD for further evaluation, exclude any neoplastic lesion or erosive esophagitis.  Will also evaluate with duodenal biopsies to monitor celiac disease   Symptomatic prolapsed hemorrhoids with low anal sphincter tone: Refer to colorectal surgery for management of prolapsed hemorrhoids and pelvic floor physical therapy anal sphincter tone Use Benefiber 1 tablespoon twice daily with meals Use Anusol suppository daily at bedtime for 7 to 10 days  Patient reports that he had normal colonoscopy 5 years ago in [place], will try to obtain the reports  Return in 6 months or sooner if needed  The patient was provided an opportunity to ask questions and all were answered. The patient agreed with the plan and demonstrated an understanding of the instructions.    I,Safa M Kadhim,acting as a scribe for Harl Bowie, MD.,have documented all relevant documentation on the behalf of Harl Bowie, MD,as directed by  Harl Bowie, MD while in the presence of Harl Bowie, MD.   I, Harl Bowie, MD, have reviewed all documentation for this visit. The documentation on 10/04/22 for the exam, diagnosis, procedures, and orders are all accurate and complete.   Damaris Hippo , MD    CC: No ref. provider found

## 2022-10-04 NOTE — Therapy (Signed)
OUTPATIENT PHYSICAL THERAPY NEURO TREATMENT NOTE   Patient Name: Douglas Cox MRN: [id number] DOB:Oct 07, 1950, 72 y.o., male Today's Date: 10/04/2022   PCP: Berneta Levins, DO REFERRING PROVIDER: Meredith Staggers, MD   END OF SESSION:  PT End of Session - 10/04/22 1149     Visit Number 3    Number of Visits 12    Date for PT Re-Evaluation 11/05/22    Authorization Type [employer]    Progress Note Due on Visit 10    PT Start Time 1146    PT Stop Time 1227    PT Time Calculation (min) 41 min    Activity Tolerance Patient tolerated treatment well    Behavior During Therapy Flat affect;Restless              Past Medical History:  Diagnosis Date   Celiac disease    Retinal detachment    Past Surgical History:  Procedure Laterality Date   CATARACT EXTRACTION Bilateral    Dr. Valetta Close   EYE SURGERY     FRACTURE SURGERY     GAS INSERTION Right 05/21/2021   Procedure: INSERTION OF GAS - C3F8;  Surgeon: Bernarda Caffey, MD;  Location: [facility];  Service: Ophthalmology;  Laterality: Right;   GAS/FLUID EXCHANGE Right 05/21/2021   Procedure: GAS/FLUID EXCHANGE;  Surgeon: Bernarda Caffey, MD;  Location: [place];  Service: Ophthalmology;  Laterality: Right;   HERNIA REPAIR     IR CT HEAD LTD  07/17/2022   IR PERCUTANEOUS ART THROMBECTOMY/INFUSION INTRACRANIAL INC DIAG ANGIO  07/17/2022   IR US GUIDE VASC ACCESS RIGHT  07/17/2022   PARS PLANA VITRECTOMY Right 05/21/2021   Procedure: PARS PLANA VITRECTOMY WITH 25 GAUGE;  Surgeon: Bernarda Caffey, MD;  Location: [facility];  Service: Ophthalmology;  Laterality: Right;   PERFLUORONE INJECTION Right 05/21/2021   Procedure: PERFLUORONE INJECTION;  Surgeon: Bernarda Caffey, MD;  Location: [facility];  Service: Ophthalmology;  Laterality: Right;   PHOTOCOAGULATION WITH LASER Right 05/21/2021   Procedure: PHOTOCOAGULATION WITH LASER;  Surgeon: Bernarda Caffey, MD;  Location: [place];  Service: Ophthalmology;  Laterality: Right;   RADIOLOGY WITH  ANESTHESIA N/A 07/17/2022   Procedure: IR WITH ANESTHESIA;  Surgeon: Radiologist, Medication, MD;  Location: [facility];  Service: Radiology;  Laterality: N/A;   RETINAL DETACHMENT SURGERY     Patient Active Problem List   Diagnosis Date Noted   Paroxysmal atrial fibrillation ([place]) 09/01/2022   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 ([place]) 07/22/2022   Protein-calorie malnutrition, severe 07/19/2022   Stroke (cerebrum) ([place]) 07/17/2022   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   Celiac disease    Alcohol dependence ([place])    Bradycardia     ONSET DATE: 09/20/2022 (MD referral)  REFERRING DIAG:  Diagnosis  I63.81 (ICD-10-CM) - 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 ([place])    THERAPY DIAG:  Muscle weakness (generalized)  Unsteadiness on feet  Abnormal posture  Rationale for Evaluation and Treatment: Rehabilitation  SUBJECTIVE:  SUBJECTIVE STATEMENT:  Still having some low back pain and some hip pain, can be excruciating especially when turning over in the bed. Wondering if hip pain is from ITBs. Have been swimming some and that seems to help, doesn't hurt. Getting a new mattress delivered tomorrow. Back and hips feel better now, having been walking and swimming a little more.   Pt accompanied by: significant other  PERTINENT HISTORY: Mediastinal mass, bradycardia; basal ganglia CVA from MD notes, distant hx of A-fib, celiac disease, retinal detachment, HLD, HTN, sleep apnea.   From hospital notes:   Presented to ED w/ acute onset of left-sided weakness as well as right frontal headache.  Admitted to [facility] on 07/17/2022.  See imaging.   PAIN:   Are you having pain? No 0/10    PRECAUTIONS: Fall  WEIGHT BEARING RESTRICTIONS: No  FALLS: Has patient fallen in last 6 months? Yes.  Number of falls 1  LIVING ENVIRONMENT: Lives with: lives with their spouse Lives in: House/apartment Stairs: Yes: Internal: 14 steps; on right going up and External: 14 steps; on left going up Has following equipment at home: Single point cane  PLOF: Independent with basic ADLs and Independent with gait  Enjoyed hiking 2-3 miles, foraging for mushrooms.  Joined the [facility] and has been swimming twice.  *Since CVA, mostly sits or lies on sofa during the day.  PATIENT GOALS: Pt's goals for therapy are to make pain stop, to get back to outdoor activities.  Wife's goal to pick up feet more.  OBJECTIVE:   TREATMENT 10/04/22   NMR  Tandem stance blue foam pad 3x30 seconds B up to light MinA for balance  Tandem gait in // bars progressing from one finger to no UE support x3 laps min guard  Sidestepping on blue foam pad x4 rounds in // bars min guard cues for upright posture Forward and lateral stepping over 4 and 12 inch hurdles, min guard cues for safety and good foot clearance Lateral and forward/backwards tilt board rocks x2 minutes each Holding static balance with board sideways 5 rounds (best hold 17 seconds) Holding static balance with board vertical 5 rounds (best hold 30 seconds)      PATIENT EDUCATION: Education details: HEP additions, rationale for stretching, strengthening to help to alleviate pain at hips (?IT band tightness versus bursitis where pain is more at greater trochanter) Person educated: Patient and Spouse Education method: Explanation, Demonstration, Verbal cues, and Handouts Education comprehension: verbalized understanding, returned demonstration, and needs further education  Access Code: [facility] URL: [email or web address] Date: 09/30/2022 Prepared by: [facility]  Exercises - Seated Hamstring Stretch  - 2 x daily - 7 x weekly - 1 sets - 3 reps - 30 sec hold - Seated Figure 4 Piriformis Stretch  - 1-2 x  daily - 7 x weekly - 1 sets - 3 reps - 30 sec hold - Lower Trunk Rotations  - 1-2 x daily - 7 x weekly - 1 sets - 5 reps - 15-30 sec hold - Hooklying Single Knee to Chest Stretch  - 1-2 x daily - 7 x weekly - 1 sets - 3-5 reps - 15-30 hold - Side to side weightshift  - 1-2 x daily - 5 x weekly - 2 sets - 10 reps -------------------------------------------------------------------------------------- (Objective measures below taken at initial evaluation:)  DIAGNOSTIC FINDINGS:  (From rehab discharge note) CT scan showed loss of gray-white differentiation in the right insular cortex lateral aspect of the right  lentiform and right temporal tip consistent with acute infarction.  No acute hemorrhage.  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  Status post TNK as well as recanalization per interventional radiology.  MRI follow-up showed acute/subacute nonhemorrhagic infarct of the right caudate head and lentiform nucleus as noted on prior previous CT.   COGNITION: Overall cognitive status:  See speech notes; flat affect at eval   SENSATION: Light touch: WFL   MUSCLE LENGTH:  Passive from 90/90 position in supine Hamstrings: Right -30 deg; Left -40 deg   POSTURE: rounded shoulders, forward head, posterior pelvic tilt, and chooses to sit with posterior pelvic tilt, leaned back against wall.  LOWER EXTREMITY ROM:   Tightness in hamstrings, tenderness to palpation along superior aspect of IT band, especially greater trochanter; tightness at hip flexors in sidelying  LOWER EXTREMITY MMT:    MMT Right Eval Left Eval  Hip flexion 4+ 4  Hip extension    Hip abduction 3+ 4  Hip adduction 4 4  Hip internal rotation    Hip external rotation    Knee flexion 4 3+  Knee extension 4 4  Ankle dorsiflexion 3+ 3+  Ankle plantarflexion    Ankle inversion    Ankle eversion    (Blank rows = not tested)  BED MOBILITY:  Independent, extra time  TRANSFERS: Assistive  device utilized: None  Sit to stand: Modified independence Stand to sit: Modified independence  STAIRS: Level of Assistance: SBA Stair Negotiation Technique: Alternating Pattern  with Bilateral Rails Number of Stairs: 2-3  Height of Stairs: 4"-6"  Comments: Decreased foot clearance  GAIT: Gait pattern:  veers to R with gait challenges, step through pattern, decreased ankle dorsiflexion- Right, decreased ankle dorsiflexion- Left, genu recurvatum- Left, wide BOS, poor foot clearance- Right, and poor foot clearance- Left Distance walked: 50 ft x 4 Assistive device utilized: None Level of assistance: SBA Comments: veers to R with balance challenges  FUNCTIONAL TESTS:  10 meter walk test: 11.78 sec (2.78 ft/sec) self-selected; FAST:  8.47 sec = 3.87 ft/sec Functional gait assessment: 14/30 (scores <22/30 indicate increased fall risk); score has decreased from 17/30 at initial eval in hospital    PATIENT SURVEYS:  NA   M-CTSIB  Condition 1: Firm Surface, EO 30 Sec, Normal Sway  Condition 2: Firm Surface, EC 30 Sec, Mild Sway  Condition 3: Foam Surface, EO 30 Sec, Mild Sway  Condition 4: Foam Surface, EC 30 Sec, Mild Sway     GOALS: Goals reviewed with patient? Yes  SHORT TERM GOALS: Target date: 10/08/2022  Pt will be supervision with HEP for improved flexibility, strength, balance, and gait. Baseline: Goal status: IN PROGRESS  2.  Pt will report pain in hips at night <5/10, for improved sleep, bed mobility positioning. Baseline: 10/10 at night, interferes with sleep Goal status: IN PROGRESS   LONG TERM GOALS: Target date: 11/05/2022  Pt will be independent with HEP for improved balance, flexibility, strength, gait. Baseline:  Goal status: IN PROGRESS  2.  Pt will improve FGA score to at least 20/30 to decrease fall risk. Baseline: 14/30 Goal status: IN PROGRESS  3.  Pt will improve gait velocity to at least 3 ft/sec self-selected speed. Baseline: 2.78 ft/sec Goal  status: IN PROGRESS  4.  Pt will ambulate at least 1000 ft, indoor and outdoor surfaces, modified independently for return to outdoor gait hiking. Baseline:  Goal status: IN PROGRESS    ASSESSMENT:  CLINICAL IMPRESSION:  Kenny arrives today doing well, hip/back pain are better today so we deferred this in favor of working on balance per family request. Did well, did need some cues for complete foot clearance and safety but needed no more than light MinA with all tasks. Of note, spouse present and videoed/took pictures of most exercises today, educated that any photo/video taken cannot include other patients in the clinic, she was agreeable to this. Will continue to progress all interventions as tolerated.   OBJECTIVE IMPAIRMENTS: Abnormal gait, decreased balance, decreased knowledge of use of DME, decreased mobility, difficulty walking, decreased ROM, decreased strength, impaired flexibility, and pain.   ACTIVITY LIMITATIONS: standing, sleeping, transfers, bed mobility, and locomotion level  PARTICIPATION LIMITATIONS: community activity and fishing, hiking  PERSONAL FACTORS: 3+ comorbidities: see above  are also affecting patient's functional outcome.   REHAB POTENTIAL: Good  CLINICAL DECISION MAKING: Evolving/moderate complexity  EVALUATION COMPLEXITY: Moderate  PLAN:  PT FREQUENCY: 2x/week  PT DURATION: 6 weeks (including eval week)  PLANNED INTERVENTIONS: Therapeutic exercises, Therapeutic activity, Neuromuscular re-education, Balance training, Gait training, Patient/Family education, Self Care, Joint mobilization, Stair training, DME instructions, Cryotherapy, and Manual therapy  PLAN FOR NEXT SESSION: Review HEP.  Ask about ice massage/pain; Continue work on hip strengthening, piriformis and IT band stretch; balance and gait training.  Deniece Ree PT DPT PN2   [facility] [address] [place], [place],   [address] Phone # [phone] Fax # [phone]

## 2022-10-05 ENCOUNTER — Encounter (HOSPITAL_COMMUNITY): Payer: Self-pay

## 2022-10-05 ENCOUNTER — Ambulatory Visit: Payer: Medicare HMO | Admitting: Occupational Therapy

## 2022-10-05 ENCOUNTER — Other Ambulatory Visit (HOSPITAL_COMMUNITY): Payer: Self-pay

## 2022-10-05 ENCOUNTER — Other Ambulatory Visit: Payer: Self-pay

## 2022-10-05 DIAGNOSIS — M6281 Muscle weakness (generalized): Secondary | ICD-10-CM

## 2022-10-05 DIAGNOSIS — R41840 Attention and concentration deficit: Secondary | ICD-10-CM | POA: Diagnosis not present

## 2022-10-05 DIAGNOSIS — I69318 Other symptoms and signs involving cognitive functions following cerebral infarction: Secondary | ICD-10-CM

## 2022-10-05 NOTE — Therapy (Signed)
OUTPATIENT OCCUPATIONAL THERAPY  Treatment Note  Patient Name: Douglas Cox MRN: [id number] DOB:08-Jan-1951, 72 y.o., male Today's Date: 10/05/2022  PCP: Dr. Zara Council ([facility]) REFERRING PROVIDER: Charlett Blake, MD   END OF SESSION:  OT End of Session - 10/05/22 1327     Visit Number 12    Number of Visits 23    Date for OT Re-Evaluation 11/05/22    Authorization Type [employer]    OT Start Time 1325    OT Stop Time 1405    OT Time Calculation (min) 40 min    Activity Tolerance Patient tolerated treatment well    Behavior During Therapy Flat affect;Restless                        Past Medical History:  Diagnosis Date   Celiac disease    Retinal detachment    Past Surgical History:  Procedure Laterality Date   CATARACT EXTRACTION Bilateral    Dr. Valetta Close   EYE SURGERY     FRACTURE SURGERY     GAS INSERTION Right 05/21/2021   Procedure: INSERTION OF GAS - C3F8;  Surgeon: Bernarda Caffey, MD;  Location: [place];  Service: Ophthalmology;  Laterality: Right;   GAS/FLUID EXCHANGE Right 05/21/2021   Procedure: GAS/FLUID EXCHANGE;  Surgeon: Bernarda Caffey, MD;  Location: [place];  Service: Ophthalmology;  Laterality: Right;   HERNIA REPAIR     IR CT HEAD LTD  07/17/2022   IR PERCUTANEOUS ART THROMBECTOMY/INFUSION INTRACRANIAL INC DIAG ANGIO  07/17/2022   IR US GUIDE VASC ACCESS RIGHT  07/17/2022   PARS PLANA VITRECTOMY Right 05/21/2021   Procedure: PARS PLANA VITRECTOMY WITH 25 GAUGE;  Surgeon: Bernarda Caffey, MD;  Location: [place];  Service: Ophthalmology;  Laterality: Right;   PERFLUORONE INJECTION Right 05/21/2021   Procedure: PERFLUORONE INJECTION;  Surgeon: Bernarda Caffey, MD;  Location: [place];  Service: Ophthalmology;  Laterality: Right;   PHOTOCOAGULATION WITH LASER Right 05/21/2021   Procedure: PHOTOCOAGULATION WITH LASER;  Surgeon: Bernarda Caffey, MD;  Location: [place];  Service: Ophthalmology;  Laterality: Right;   RADIOLOGY WITH ANESTHESIA N/A  07/17/2022   Procedure: IR WITH ANESTHESIA;  Surgeon: Radiologist, Medication, MD;  Location: [place];  Service: Radiology;  Laterality: N/A;   RETINAL DETACHMENT SURGERY     Patient Active Problem List   Diagnosis Date Noted   Paroxysmal atrial fibrillation ([place]) 09/01/2022   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 ([place]) 07/22/2022   Protein-calorie malnutrition, severe 07/19/2022   Stroke (cerebrum) ([place]) 07/17/2022   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   Celiac disease    Alcohol dependence ([place])    Bradycardia     ONSET DATE: 08/04/22 (referral date)  REFERRING DIAG: I63.81 (ICD-10-CM) - 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   Pt accompanied by: self and significant other (spouse)  PERTINENT HISTORY: 72 y.o. male with a PMHx of cataracts, celiac disease and retinal detachment who presents to the ED with acute onset of left hemiplegia, right gaze deviation and left hemineglect.MRI: Acute/subacute nonhemorrhagic infarct of the right caudate head and lentiform nucleus; acute/subacute nonhemorrhagic infarct of the left caudate head and lentiform nucleus spanning  the internal capsule; Acute/subacute nonhemorrhagic infarct of the cortex of the right temporal tip.  PRECAUTIONS: Fall  WEIGHT BEARING RESTRICTIONS: No  PAIN:  Are you having pain? No  FALLS: Has patient fallen in last 6 months? Yes. Number of falls 2 - 1 when he had the stroke he fell forward and hit his head and fell off a ladder  LIVING ENVIRONMENT: Lives with: lives with their spouse Lives in: House/apartment Stairs: Yes:  Internal: 16 steps; on right going up and External: 4 steps to front door, 16 steps from garage to main floor of home steps; on right going up Has following equipment at home: Single point cane  PLOF: Independent, working as an Pension scheme manager  PATIENT GOALS: to improve thought process, speech  OBJECTIVE:   HAND DOMINANCE: Right  ADLs: Overall ADLs: Independent with all bathing/dressing.    IADLs: Shopping: Not interested in going to the store, but would have gone prior to CVA Light housekeeping: is able to assist with household tasks when spouse asks for assistance or directs him to complete Meal Prep: Spouse is proving supervision for meal prep, but has been able to cook fish Community mobility: MD has not cleared pt to drive Medication management: Spouse administers medication, is having difficulty with administering B12 shot Financial management: Spouse is currently paying bills, but pt would have completed prior to CVA   COORDINATION: Finger Nose Finger test: mild ataxia, dysmetria LUE 9 Hole Peg test: Right: 32.37 sec; Left: 50.04 sec  Noted some difficulty with sequencing, requiring cues for recall and sequencing of directions Box and Blocks:  Right 38 blocks, Left 40 blocks (Pt frequently picking up 2-3 blocks at a time despite mod-max cues to pick up only one block at a time.    COGNITION: Overall cognitive status: Impaired Orientation Level: Person;Place;Situation Person: Oriented Place: Disoriented Situation: Disoriented Memory: Impaired Memory Impairment: Storage deficit;Retrieval deficit;Decreased recall of new information;Decreased short term memory Attention: Focused Awareness: Impaired Awareness Impairment: Intellectual impairment Problem Solving: Impaired Problem Solving Impairment: Verbal basic;Functional basic Behaviors: Restless;Impulsive Safety/Judgment: Impaired  OBSERVATIONS: Pt demonstrating attention at focused level during 9 hole peg test, requiring  frequent cues and redirection to attend to 2 step task with Box and Blocks.  Pt demonstrating decreased interest and motivation during OT evaluation, however pt and spouse agreeable to continue with OT at this time to focus on memory and return to engagement in IADLs and leisure pursuits.  TODAY'S TREATMENT:                                  10/05/22 Attention: Engaged in small peg board pattern replication with use of key to identify correlating colors, challenging alternating attention, and coordination. Pt demonstrating great manipulation of pegs in-hand to place in peg board. Pt making error about third of the way through activity and when OT mentions that an error may have occurred, pt voiced that he "did not care".  Upon completion of task, pt requiring line by line cues to locate errors and to correct.  Pt able to recognize errors when taken line by line but with decreased insight to functional carryover. Educated on functional carryover of task implications to relate to recent issues/concerns at home with ordering incorrect mattress or possible errors with meal prep and/or check balancing.   Provide directions: OT directed pt to provide driving directions from clinic to frequented [employer] as well as from his home to [employer] [place].  Pt initially providing a few options for directions to [employer], making changes to then identify most efficient route correctly.  Pt naming directions to [facility], unable to recall street name at destination but accurately describing which street.   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  Pt tossing ball quickly, requiring cues for increased amplitude of toss to further challenge balance and dual tasking.  Pt only able to name one theater play, despite cues from spouse to identify  additional.   10/01/22 Attention: engaged in peg board pattern replication while challenging pt to name songs by favorite artists.  Pt demonstrating ability to name 2-3 songs by Grateful Dead before requiring cues to identify additional 2-3 more.  Pt able to name [place] with significantly increased time.  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  Pt demonstrating impulsivity with placing of pegs, requiring gesture cues and removal of additional stimulus to allow pt to focus on naming of items while placing pegs.  Attention: modified activity to completing 12 piece jigsaw puzzle while singing along to familiar songs.  Pt demonstrating ability to sustain attention to task, however requiring ~4 mins to complete 12 piece puzzle.  Encouraged pt to name additional musical artists in this genre, with pt able to name up to 5 with increased time.   09/22/22 Pill box assessment: Completed in 5:14. At 5 min time limit pt had completed all except 4 while administering last prescription.  Of note, pt initially starting medication in vertical method instead of linear, requiring need to start over which took some time, but demonstrated good awareness.  Pt demonstrating improved sequencing and following of prescription instructions compared to previous session.  Pt reports that he is taking his medications as prescribed, will occasionally ask spouse to confirm prior to taking.  Trail making B: completed per spouse request with focus on amount of cues and timing.  OT terminated task at 4:02 as pt with 2 errors and beginning to voice increased frustration.  Pt able to complete to 10J with one error unrecognized and one error corrected with no cues from pt or spouse.  OT educated on functional carryover of alternating attention to meal prep with multiple items to cook and with playing guitar while signing.     PATIENT EDUCATION: Education details: Educated on  Estate agent during daily tasks Person educated: Patient and Spouse Education method: Explanation Education comprehension: verbalized understanding and needs further education  HOME EXERCISE PROGRAM: TBD   GOALS: Goals reviewed with patient? Yes  SHORT TERM GOALS: Target date: 10/15/22  Pt will be able to identify/verbalize steps and/or items needed to complete simple functional task (simple snack prep, laundry task, etc) with supervision. Baseline: Goal status: IN PROGRESS  2.  Patient will be able to maintain alternating attention for 2 mins during structured task without additional cues for attention.  Baseline:  Goal status: IN PROGRESS  3.  Pt will be able to utilize phone to locate contacts and make emergency calls as needed without cues in <90 seconds. Baseline:  Goal status: IN PROGRESS   LONG TERM GOALS: Target date: 11/05/22  Pt will demonstrate and/or verbalize ability to sequence advanced meal prep task (to include having all meal items completed at same time) at Mod I level. Baseline:  Goal  status: IN PROGRESS  2.  Pt will navigate a moderately busy environment, following multi-step commands with 90% accuracy to simulate return to community reintegration. Baseline:  Goal status: IN PROGRESS  3.  Pt will be able to utilize phone to look up phone number for physician and/or other frequently used number/location without cues in <90 seconds. Baseline:  Goal status: IN PROGRESS  4.  Pt will be able to verbalize wayfinding/directions from home to familiar locations in the area without cues. Baseline:  Goal status: IN PROGRESS  5.  Pt will demonstrate ability to maintain selective attention for 5 mins during structured task without additional cues for attention.   Baseline:  Goal status: IN PROGRESS    ASSESSMENT:  CLINICAL IMPRESSION: Pt demonstrating decreased engagement in tasks this session, due to frustration with situation at home  (wrong mattress delivered).  Pt with decreased insight into errors during table top task and functional implications. Pt also with decreased sequencing and ability to complete cognitive/motor dual task, largely due to decreased attention as well as recall.  PERFORMANCE DEFICITS: in functional skills including IADLs, coordination, Fine motor control, Gross motor control, body mechanics, endurance, decreased knowledge of precautions, decreased knowledge of use of DME, and UE functional use, cognitive skills including attention, energy/drive, memory, orientation, problem solving, safety awareness, sequencing, and temperament/personality, and psychosocial skills including coping strategies, habits, and routines and behaviors.   IMPAIRMENTS: are limiting patient from IADLs and leisure.   CO-MORBIDITIES: may have co-morbidities  that affects occupational performance. Patient will benefit from skilled OT to address above impairments and improve overall function.  MODIFICATION OR ASSISTANCE TO COMPLETE EVALUATION: Min-Moderate modification of tasks or assist with assess necessary to complete an evaluation.  OT OCCUPATIONAL PROFILE AND HISTORY: Detailed assessment: Review of records and additional review of physical, cognitive, psychosocial history related to current functional performance.  CLINICAL DECISION MAKING: Moderate - several treatment options, min-mod task modification necessary  REHAB POTENTIAL: Good  EVALUATION COMPLEXITY: Moderate    PLAN:  OT FREQUENCY: 1-2x/week  OT DURATION: 6 weeks  PLANNED INTERVENTIONS: self care/ADL training, therapeutic exercise, therapeutic activity, balance training, functional mobility training, patient/family education, cognitive remediation/compensation, psychosocial skills training, energy conservation, coping strategies training, and DME and/or AE instructions  RECOMMENDED OTHER SERVICES: NA  CONSULTED AND AGREED WITH PLAN OF CARE: Patient and family  member/caregiver  PLAN FOR NEXT SESSION: selective attention - functional tasks with providing directions, making phone calls, preparing simple meal.  Complex/simple organization/planning.  Dual tasking with playing guitar while singing, and/or mental math to complete scheduling items and ensuring meal prep in timely manner (all items completed around same time).   Haru Anspaugh, [place], OTR/L 10/05/2022, 1:28 PM

## 2022-10-06 ENCOUNTER — Encounter: Payer: Self-pay | Admitting: Gastroenterology

## 2022-10-06 ENCOUNTER — Other Ambulatory Visit (HOSPITAL_BASED_OUTPATIENT_CLINIC_OR_DEPARTMENT_OTHER): Payer: Self-pay

## 2022-10-06 ENCOUNTER — Other Ambulatory Visit (HOSPITAL_COMMUNITY): Payer: Self-pay

## 2022-10-06 ENCOUNTER — Ambulatory Visit: Payer: Medicare HMO | Admitting: Gastroenterology

## 2022-10-06 ENCOUNTER — Ambulatory Visit: Payer: Medicare HMO

## 2022-10-06 VITALS — BP 122/70 | HR 50 | Ht 70.0 in | Wt 149.5 lb

## 2022-10-06 DIAGNOSIS — K219 Gastro-esophageal reflux disease without esophagitis: Secondary | ICD-10-CM | POA: Diagnosis not present

## 2022-10-06 DIAGNOSIS — K5904 Chronic idiopathic constipation: Secondary | ICD-10-CM | POA: Diagnosis not present

## 2022-10-06 DIAGNOSIS — K649 Unspecified hemorrhoids: Secondary | ICD-10-CM

## 2022-10-06 DIAGNOSIS — R0681 Apnea, not elsewhere classified: Secondary | ICD-10-CM

## 2022-10-06 MED ORDER — LINACLOTIDE 72 MCG PO CAPS
72.0000 ug | ORAL_CAPSULE | Freq: Every day | ORAL | 2 refills | Status: DC
  Filled 2022-10-06: qty 30, 30d supply, fill #0
  Filled 2022-10-26: qty 30, 30d supply, fill #1

## 2022-10-06 MED ORDER — FAMOTIDINE 20 MG PO TABS
20.0000 mg | ORAL_TABLET | Freq: Every day | ORAL | 4 refills | Status: DC
  Filled 2022-10-06 – 2023-02-08 (×4): qty 90, 90d supply, fill #0
  Filled 2023-05-20: qty 90, 90d supply, fill #1
  Filled 2023-08-31: qty 90, 90d supply, fill #2

## 2022-10-06 MED ORDER — PANTOPRAZOLE SODIUM 40 MG PO TBEC
40.0000 mg | DELAYED_RELEASE_TABLET | Freq: Every day | ORAL | 3 refills | Status: DC
  Filled 2022-10-06: qty 90, 90d supply, fill #0
  Filled 2022-10-26 – 2023-01-25 (×2): qty 90, 90d supply, fill #1
  Filled 2023-04-18: qty 90, 90d supply, fill #2
  Filled 2023-08-01: qty 90, 90d supply, fill #3

## 2022-10-06 NOTE — Patient Instructions (Signed)
We have sent the following medications to your pharmacy for you to pick up at your convenience:  Linzess Pantoprazole  Pepcid    Take Fibercon 1 tablespoon three times a day with meals   We will discuss Endoscopy in 3 months   _______________________________________________________  If your blood pressure at your visit was 140/90 or greater, please contact your primary care physician to follow up on this.  _______________________________________________________  If you are age 73 or older, your body mass index should be between 23-30. Your Body mass index is 21.45 kg/m. If this is out of the aforementioned range listed, please consider follow up with your Primary Care Provider.  If you are age 24 or younger, your body mass index should be between 19-25. Your Body mass index is 21.45 kg/m. If this is out of the aformentioned range listed, please consider follow up with your Primary Care Provider.   ________________________________________________________  The [facility] [facility] providers would like to encourage you to use [facility] to communicate with providers for non-urgent requests or questions.  Due to long hold times on the telephone, sending your provider a message by [facility] may be a faster and more efficient way to get a response.  Please allow 48 business hours for a response.  Please remember that this is for non-urgent requests.  _______________________________________________________   I appreciate the  opportunity to care for you  Thank You   Harl Bowie , MD

## 2022-10-06 NOTE — Therapy (Unsigned)
OUTPATIENT SPEECH LANGUAGE PATHOLOGY TREATMENT   Patient Name: Douglas Cox MRN: [id number] DOB:05-Jan-1951, 72 y.o., male Today's Date: 10/07/2022  PCP: None Recorded REFERRING PROVIDER: Alysia Penna, MD  END OF SESSION:  End of Session - 10/07/22 1101     Visit Number 15    Number of Visits 25    Date for SLP Re-Evaluation 11/09/22    SLP Start Time 1101    SLP Stop Time  1141    SLP Time Calculation (min) 40 min    Activity Tolerance Patient tolerated treatment well;Patient limited by fatigue                 Past Medical History:  Diagnosis Date   Celiac disease    Retinal detachment    Stroke [facility])    Past Surgical History:  Procedure Laterality Date   CATARACT EXTRACTION Bilateral    Dr. Valetta Close   EYE SURGERY     FRACTURE SURGERY     GAS INSERTION Right 05/21/2021   Procedure: INSERTION OF GAS - C3F8;  Surgeon: Bernarda Caffey, MD;  Location: [place];  Service: Ophthalmology;  Laterality: Right;   GAS/FLUID EXCHANGE Right 05/21/2021   Procedure: GAS/FLUID EXCHANGE;  Surgeon: Bernarda Caffey, MD;  Location: [place];  Service: Ophthalmology;  Laterality: Right;   HERNIA REPAIR     IR CT HEAD LTD  07/17/2022   IR PERCUTANEOUS ART THROMBECTOMY/INFUSION INTRACRANIAL INC DIAG ANGIO  07/17/2022   IR US GUIDE VASC ACCESS RIGHT  07/17/2022   PARS PLANA VITRECTOMY Right 05/21/2021   Procedure: PARS PLANA VITRECTOMY WITH 25 GAUGE;  Surgeon: Bernarda Caffey, MD;  Location: [place];  Service: Ophthalmology;  Laterality: Right;   PERFLUORONE INJECTION Right 05/21/2021   Procedure: PERFLUORONE INJECTION;  Surgeon: Bernarda Caffey, MD;  Location: [place];  Service: Ophthalmology;  Laterality: Right;   PHOTOCOAGULATION WITH LASER Right 05/21/2021   Procedure: PHOTOCOAGULATION WITH LASER;  Surgeon: Bernarda Caffey, MD;  Location: [place];  Service: Ophthalmology;  Laterality: Right;   RADIOLOGY WITH ANESTHESIA N/A 07/17/2022   Procedure: IR WITH ANESTHESIA;  Surgeon: Radiologist,  Medication, MD;  Location: [place];  Service: Radiology;  Laterality: N/A;   RETINAL DETACHMENT SURGERY     Patient Active Problem List   Diagnosis Date Noted   Paroxysmal atrial fibrillation ([place]) 09/01/2022   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 ([place]) 07/22/2022   Protein-calorie malnutrition, severe 07/19/2022   Stroke (cerebrum) ([place]) 07/17/2022   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   Celiac disease    Alcohol dependence ([place])    Bradycardia      ONSET DATE: 07/17/22   REFERRING DIAG: I63.81 (ICD-10-CM) - Other cerebral infarction due to occlusion or stenosis of small artery  THERAPY DIAG:  Cognitive communication deficit  Rationale for Evaluation and Treatment: Rehabilitation  SUBJECTIVE:   SUBJECTIVE STATEMENT: "He didn't sleep well last night"  Pt accompanied by: significant other Douglas Cox (wife)  PERTINENT HISTORY: Douglas Cox presented to the ED with acute onset of left hemiplegia, right gaze deviation and left hemineglect while working in his garage.  TNK given and recanalization acheived with thrombectomy.  Later at 1800 noted right sided weakness and new CT Head and CTA head and neck reordered and negative for reocclusion. MRI today shows bilateral basal ganglia infarcts. ASA '81mg'$  started 12/17  Pt with ETT for thrombectomy and overnight.  Extubated 12/17    PAIN: Are you having pain? Yes; back & hips (3/10)   PATIENT GOALS: Pt did not answer question when asked, due  to attention/processing deficits  OBJECTIVE:   DIAGNOSTIC FINDINGS:  FEES 07/30/22 Pt demonstrates a moderate oropharyngeal dysphagia that is impacted by patient's current cognitive deficits. Patient's oral phase is characterized by poor awareness of bolus resulting in inconsistent timing of swallowing trigger. With solids, patient needed verbal cues (snapping and counting) to attend and initiate a swallow with all boluses contained in the velleculae. With liquids, patient consistently triggered his  swallow at the pyriform sinuses. This resulted in sensed aspiration of thin liquids via cup. Patient also with inconsistent amounts of pharyngeal residue that cleared with a 2nd subsequent swallow. No penetration or aspiration noted with nectar-thick liquids except for 1 episode of flash penetration. Provided thorough education to the patient's wife regarding patient's current swallowing function and that he is currently at a higher risk of aspiration due to cognitive deficits. She verbalized understanding and agreement with recommendations of continuing Dys. 3 textures but upgrading to nectar-thick liquids via cup with strict aspiration precautions and supervision. This will hopefully improve patient's intake of liquids with improved ability to stay hydrated without IV fluids prior to potential discharge on 08/03/22. SLP Diet Recommendations Dysphagia 3 (Mech soft) solids;Nectar thick liquid Liquid Administration via Cup;Spoon;No straw Medication Administration Crushed with puree Compensations: Slow rate;Small sips/bites;Minimize environmental distractions;Multiple dry swallows after each bite/sip  Today, pt ate dys III and drank nectar liqiuds. See "clinicial impressions" for results.  STANDARDIZED ASSESSMENTS: CLQT: may need to be completed in first 1-3 sessions  PATIENT REPORTED OUTCOME MEASURES (PROM): Cognitive function: Short Form: to be administered in first 1-3 sessions   TODAY'S TREATMENT:                                                                                                                                          10/06/22: Reported increased fatigue today due to poor sleep. SLP and wife provided usual max prompting to engage patient in conversation. Intermittent mod cues provided to aid recall and word finding in discussion of personally relevant information. Pt reportedly very distracted by phone and television at home. Pt noted to check phone/watch x5 this session but easily  re-directed back to task without prompting. Wife reported pt experiencing difficulty with low pitch while singing. SLP educated and instructed pitch glides exercises (low to high; high to low), in which pt able to demonstrate adequate range with mod I. Targeted varying pitch while reading sentences, with usual mod A to generate different pitch (seemingly related to cognitive component versus voice). Frequent cues required to optimize volume to be intelligible today, even for single word responses. Recommended using old plays/scripts to target volume, projection, and inflection as pt was previously an avid Equities trader.   2/29/23: Pt needs CLQT and PROM. Pt with flat affect today, with seemingly dulled awareness -much moreso than last 3-4 sessions. Pt taking Tramadol to sleep in the past three nights due to pain. Today SLP targeted pt's  selective attention and awareness with functional task - setting up [place]. Pt req'd cues for attention to SLP or wife's voice when asking him about setup, consistently, requiring x2 requests to generate a verbal response, which was unintelligible 80% of the time today, requiring a third request from listener. Overall, pt was largely disengaged from verbal stimulation during ST session and was hyper-focused on the watch settings.   09/22/22: Pt demonstrated min-mod deficits with divided attention between singing song lyrics, strumming, singing, and playing chords. Chords suffered if pt sang more distinct lyrics and chords suffered if pt focused more on singing and/or strumming. Pt used alternating attention when SLP asked him questions; occasionally pt did not answer SLP, likely due to decr'd attention.  Pt's voice exhibited greater amount of WNL voice in higher pitches than lower pitches. SLP strongly urged pt to practice guitar at least 20 minutes a day and explained rationale.   09/20/22:  Pt needs CLQT and PROM. Pt upset about not finishing homework. Shanon Brow entered today  after OT Flat affect. Attention targeted today in conversation. Pt held topic with WNL topic maintenance for 12 minutes, then 11 minutes. Pt is tracking his own meds correctly at this time. Does not have a pill organizer, just uses bottles. Pt cooking with wife at home - smoke alarm went off due to pt not turning stove down from searing fish. Wife stated smoke alarm is "sensitive", however SLP questions if pt should be cooking only with wife present in kitchen.    No coughing with meals/liquids. Taking meds whole with liquids now.   09/15/22: SLP targeted attention and verbal expression today in a functional task. Pt was able to sustain attention for 10 minutes in two conversational tasks. 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 Pt recalled he won bet from Monday and SLP owes him 25 cents.  09/13/22: Pt needs cognitive PROM, CLQT. Consider [place] Naming Test. Pt and wife stated pt coughed with corn chips last night so SLP assessed pt with peanut butter crackers today without any overt s/sx aspiration or any oral stage difficulty, nor any other pharyngeal stage deficits. Pt deemed/appears safe with regular food items. Wife stated pt ate with bean dip but complained of pharyngeal stasis after coughing. SLP postulates if this was an anomalous incident as pt has not reported ANY coughing (nor wife endorsing any coughing) with POs.  Today SLP measured pt's loud /a/ with "hey" and with loud /a/ - variable loudness responses (never >73dB) between 62-73 dB. Loud /a/ averaged in upper 60s dB with usual mod A for loudness. Pt tearful about his singing voice; initially pt's voice very tremulous and this subsided over time today. Pt told to practice 10 loud /a/ BID.  Pt recalled 3/7 details about this weekend's events. Wife with excellent cues for pt's memory. Today, pt initiated sentences with 3-4 words and then he paused for 5-8 seconds of  silence prior to completing sentence; 20% of the time pt abandoned sentence/utterance.  09/08/22: Pt was doing bills this morning and called about a incr in their mortgage amount, independently. SLP asked pt to [facility] and pt able to do so (and noted SLP error) without 2/4 road names (aware of one error). Pt recalled 3/5 items from today's schedule prior to ST. He did not think or attempt to write down 3 medication names. He "bet" SLP he  would remember to bring his vitamin D next session. Held attention for 8 minutes prior to observation of loss of attention. At times pt stops verbal expression as if he might be experiencing anomia.  SLP to consider administering [place] (BNT-2).   09/06/22: Pt was able to hold 7-8 minute conversation with SLP today in therapy, x3. Speech volume not as loud as Friday 09/03/22, and pt's processing appeared slower today in general than on Friday 09/03/22. Pt had busy weekend and made schedule for the day with [facility] assistance. 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 Pt to cont to follow schedule to encourage brain stimulating tasks at home.  CONSIDER ADMINISTRATION OF CLQT for obtaining a baseline of pt's cognitive linguistics.  09/03/22: Pt recalled 3 iof his questions and the answers from Dr. Naaman Plummer appointment spontaneously. SLP and wife provided the other two with min-mod A and total A. Pt recalled one answer from these two spontaneously. Pt held attention for 10 minutes in conversation about this appointment and about exercise.   08/31/22: Pt and wife deny cont'd overt s/sx aspiration with meals. Pt still does not take meds with water due to "They are bitter." Douglas Cox said pt has Dr. Naaman Plummer tomorrow so SLP had pt write down questions he has for Dr. Naaman Plummer. He thought of 2 spontaneously (5 with mod-max cues), and req'd consistent extra  time to think of questions, mod A consistently for legible writing (decr'd emergent awareness), mod A usually for writing question in a format he would recall tomorrow (decr'd anticipatory awareness). Douglas Cox remarked that often she is having to encourage pt to get up from the chair to do things during the day. SLP strongly suggested a schedule and provided example of this, and also reiterated that pt will have decr'd ability to initiate tasks at this time and may require wife's cues to do daily tasks. SLP provided example of SLP calling pt's name in waiting room and him not arising until wife arises. Pt showing some incr'd awareness asking SLP if he will be able to return to things he was doing pre-CVA. 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  1/25:24: Wife asked if pt was safe for thin liquids. SLP told pt/wife that if pt took single sips and was not exhibiting coughing he could have thin liquids. SLP to assess next session. In a quiet environment, pt completed written math task for 9 minutes prior to requiring 5 minute break, evidenced by difficulty with/inability in adding simple figures. Pt impulsive about starting back to task again and req'd SLP cueing for longer break. After 6 minutes, pt said, "Get back to this again," and re-focused on written math task- processing speed improved for 3 more entries until pt had to leave. SLP educated/highlighted to pt and wife the importance of taking breaks when he feels "stuck".   08/24/22: speech/cognition: Pt demonstrated sustained attention for 7 minutes today for therapy task of writing down reasons for each med. He demonstrated attention adequate to recall 3/5 reasons 2 minutes later, when given extra time, and required mod A with other two. He continues to perform attention lengthening tasks at home. Wife has downloaded [employer] to her phone and has directed pt to complete. Pt rec'd cont'd rationale  today for his meds - he "does not like taking  pills." Pt inquired reason for CVA today in session and was told by wife and SLP. Today, pt's reasoning questionable with rationale to not take med/s with pureed but pt indicated he did not enjoy the smoothies wife was making for him with meds in them.  Swallowing: SLP discussed ways to assist pt in taking meds as they are currently being crushed and put into a smoothie for pt; "Do I have to have the smoothies" pt inquired. SLP suggested meds with applesauce, and yogurt, one pureed item at a time, and pt declined each time. SLP, pt, and wife eventually agreed pt may attempt one med with pureed item until next session.   PATIENT EDUCATION: Education details: see "today's treatment" Person educated: Patient and Spouse Education method: Explanation Education comprehension: verbalized understanding and needs further education   GOALS: Goals reviewed with patient? No  SHORT TERM GOALS: Target date: 2/9/2, 09/27/22 (revised 2/12/224)  Pt will follow aspiration precautions from FEES with occasional min A in 2 sessions Baseline: Goal status: deferred - pt not coughing with POs  2.  [place] will demo sustained attention to participate in 10 minute therapy task x3/session, in 3 sessions Baseline: 09/03/22, 09-20-22, 09/22/22 Goal status: met  3.  Pt will demo understanding of written cues in simple therapy tasks, with initial cue allowed, in 3 sessions Baseline: 08-19-22, 09-03-22 Goal status: Met  4.  Pt will demo selective attention for 10 minutes to a simple therapy task in a min-mod noisy environment Baseline:  Goal status: Not met  5.  Pt/wife will tell SLP 3 overt s/sx aspiration PNA with modified independence Baseline:  Goal status: Deferred - pt  no longer couhging with POs  6   Using compensations, pt will ask wife to give him his medications, at least once/day between 3 sessions Baseline:  Goal status: Met   LONG TERM GOALS: Target date:  11/09/22  Pt will follow aspiration precautions from FEES with rare min A in 3 sessions Baseline:  Goal status: Deferred   2.  Pt will follow aspiration precautions from FEES with modified independence in 2 sessions Baseline:  Goal status: deferred  3.  Pt will demo selective attention for 15 minutes to a simple therapy task in a min-mod noisy environment Baseline:  Goal status: Modified  4.  Pt will demo emergent awareness in simple therapy tasks with verbal cue for error awareness in 3 sessions Baseline:  Goal status: Ongoing  5.  Pt will demo alternating attention for 15 minutes to complete simple-mod complex therapy tasks 100% accuracy given min cues, in 3 sessions Baseline:  Goal status: Modified  6.  Using compensations, pt will recall to administer his own medications correctly, at least once/day between 3 sessions Baseline:  Goal status: Ongoing  7.  Wife's PROM (due to pt's attention deficit) score for pt will improve in last 1-2 sessions than the score taken in the first 1-2 sessions Baseline:  Goal status: Ongoing  ASSESSMENT:  CLINICAL IMPRESSION: Modified two LTGs for attention based upon pt difficulty thus far with progress with attention. Patient is a 72 y.o. male who was seen today for treatment of simple cognition (primarily attention) in light of CVA 07-17-22. Pt swallowing is WNL, is now tracking his own meds correctly. .   OBJECTIVE IMPAIRMENTS: include attention, memory, awareness, executive functioning, receptive language, and dysphagia. These impairments are limiting patient from return to work, managing medications, managing appointments, managing finances, household responsibilities, ADLs/IADLs, effectively communicating at home and in community,  and safety when swallowing. Factors affecting potential to achieve goals and functional outcome are ability to learn/carryover information, cooperation/participation level, and severity of impairments.. Patient will  benefit from skilled SLP services to address above impairments and improve overall function.  REHAB POTENTIAL: Good  PLAN:  SLP FREQUENCY: 2x/week  SLP DURATION: 12 weeks  PLANNED INTERVENTIONS: Aspiration precaution training, Pharyngeal strengthening exercises, Diet toleration management , Environmental controls, Trials of upgraded texture/liquids, Cueing hierachy, Cognitive reorganization, Internal/external aids, Multimodal communication approach, SLP instruction and feedback, Compensatory strategies, and Patient/family education    Marzetta Board, [place] 10/07/2022, 11:42 AM

## 2022-10-07 ENCOUNTER — Encounter: Payer: Medicare HMO | Admitting: Occupational Therapy

## 2022-10-07 ENCOUNTER — Other Ambulatory Visit (HOSPITAL_COMMUNITY): Payer: Self-pay

## 2022-10-07 ENCOUNTER — Ambulatory Visit: Payer: Medicare HMO

## 2022-10-07 ENCOUNTER — Inpatient Hospital Stay: Payer: Medicare HMO | Admitting: Diagnostic Neuroimaging

## 2022-10-07 DIAGNOSIS — R41840 Attention and concentration deficit: Secondary | ICD-10-CM | POA: Diagnosis not present

## 2022-10-07 DIAGNOSIS — R41841 Cognitive communication deficit: Secondary | ICD-10-CM

## 2022-10-07 NOTE — Patient Instructions (Signed)
Continue with "ahh" or "hey!"   Pitch range:  Raise pitch from low to high x5  Low pitch from high to low x5   Focus on projecting your voice while you are speaking and reading. Consider using your old plays ([place])

## 2022-10-11 ENCOUNTER — Encounter: Payer: Medicare HMO | Admitting: Occupational Therapy

## 2022-10-11 ENCOUNTER — Ambulatory Visit: Payer: Medicare HMO

## 2022-10-11 ENCOUNTER — Telehealth: Payer: Self-pay | Admitting: Gastroenterology

## 2022-10-11 ENCOUNTER — Ambulatory Visit: Payer: Medicare HMO | Admitting: Occupational Therapy

## 2022-10-11 NOTE — Telephone Encounter (Signed)
Inbound call from patients wife requesting a call back to discuss two questions that she has. She has questions about patients medications and also stated that he is supposed to have a dental implant on march 18th and was given clearance from his cardiologist to hold his Eliquis the night before and wanted to see if Dr. Silverio Decamp would be okay with him having procedure. Please advise.

## 2022-10-12 ENCOUNTER — Ambulatory Visit: Payer: Medicare HMO | Admitting: Physical Therapy

## 2022-10-12 ENCOUNTER — Encounter: Payer: Self-pay | Admitting: Physical Therapy

## 2022-10-12 DIAGNOSIS — R41840 Attention and concentration deficit: Secondary | ICD-10-CM | POA: Diagnosis not present

## 2022-10-12 DIAGNOSIS — R2681 Unsteadiness on feet: Secondary | ICD-10-CM

## 2022-10-12 DIAGNOSIS — R293 Abnormal posture: Secondary | ICD-10-CM

## 2022-10-12 DIAGNOSIS — M6281 Muscle weakness (generalized): Secondary | ICD-10-CM

## 2022-10-12 NOTE — Telephone Encounter (Signed)
Left message to call back  

## 2022-10-12 NOTE — Therapy (Signed)
OUTPATIENT PHYSICAL THERAPY NEURO TREATMENT NOTE   Patient Name: Douglas Cox MRN: [id number] DOB:12-05-1950, 72 y.o., male Today's Date: 10/12/2022   PCP: Berneta Levins, DO REFERRING PROVIDER: Meredith Staggers, MD   END OF SESSION:  PT End of Session - 10/12/22 1228     Visit Number 4    Number of Visits 12    Date for PT Re-Evaluation 11/05/22    Authorization Type [employer]    Progress Note Due on Visit 10    PT Start Time 1230    PT Stop Time 1314    PT Time Calculation (min) 44 min    Equipment Utilized During Treatment Gait belt    Activity Tolerance Patient tolerated treatment well    Behavior During Therapy Flat affect;WFL for tasks assessed/performed               Past Medical History:  Diagnosis Date   Celiac disease    Retinal detachment    Stroke [facility])    Past Surgical History:  Procedure Laterality Date   CATARACT EXTRACTION Bilateral    Dr. Valetta Close   EYE SURGERY     FRACTURE SURGERY     GAS INSERTION Right 05/21/2021   Procedure: INSERTION OF GAS - C3F8;  Surgeon: Bernarda Caffey, MD;  Location: [place];  Service: Ophthalmology;  Laterality: Right;   GAS/FLUID EXCHANGE Right 05/21/2021   Procedure: GAS/FLUID EXCHANGE;  Surgeon: Bernarda Caffey, MD;  Location: [place];  Service: Ophthalmology;  Laterality: Right;   HERNIA REPAIR     IR CT HEAD LTD  07/17/2022   IR PERCUTANEOUS ART THROMBECTOMY/INFUSION INTRACRANIAL INC DIAG ANGIO  07/17/2022   IR US GUIDE VASC ACCESS RIGHT  07/17/2022   PARS PLANA VITRECTOMY Right 05/21/2021   Procedure: PARS PLANA VITRECTOMY WITH 25 GAUGE;  Surgeon: Bernarda Caffey, MD;  Location: [place];  Service: Ophthalmology;  Laterality: Right;   PERFLUORONE INJECTION Right 05/21/2021   Procedure: PERFLUORONE INJECTION;  Surgeon: Bernarda Caffey, MD;  Location: [place];  Service: Ophthalmology;  Laterality: Right;   PHOTOCOAGULATION WITH LASER Right 05/21/2021   Procedure: PHOTOCOAGULATION WITH LASER;  Surgeon: Bernarda Caffey, MD;  Location: [place];  Service: Ophthalmology;  Laterality: Right;   RADIOLOGY WITH ANESTHESIA N/A 07/17/2022   Procedure: IR WITH ANESTHESIA;  Surgeon: Radiologist, Medication, MD;  Location: [place];  Service: Radiology;  Laterality: N/A;   RETINAL DETACHMENT SURGERY     Patient Active Problem List   Diagnosis Date Noted   Paroxysmal atrial fibrillation ([place]) 09/01/2022   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 ([place]) 07/22/2022   Protein-calorie malnutrition, severe 07/19/2022   Stroke (cerebrum) (-[place]) 07/17/2022   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   Celiac disease    Alcohol dependence ([place])    Bradycardia     ONSET DATE: 09/20/2022 (MD referral)  REFERRING DIAG:  Diagnosis  I63.81 (ICD-10-CM) - 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 ([place])    THERAPY DIAG:  Unsteadiness on feet  Abnormal posture  Muscle weakness (generalized)  Rationale for Evaluation and Treatment: Rehabilitation  SUBJECTIVE:  SUBJECTIVE STATEMENT: Initially had a big problem with the new mattress-just couldn't get comfortable.  Have had to switch out to another mattress and that's better.  Wife has been rubbing tiger balm and that is helping my hips.  Pt accompanied by: significant other  PERTINENT HISTORY: Mediastinal mass, bradycardia; basal ganglia CVA from MD notes, distant hx of A-fib, celiac disease, retinal detachment, HLD, HTN, sleep apnea.   From hospital notes:   Presented to ED w/ acute onset of left-sided weakness as well as right frontal headache.  Ad[facility] to [facility] on 07/17/2022.  See imaging.   PAIN:   Are you having pain? No 0/10    PRECAUTIONS: Fall  WEIGHT BEARING RESTRICTIONS: No  FALLS: Has patient fallen in last 6 months? Yes. Number of falls 1  LIVING  ENVIRONMENT: Lives with: lives with their spouse Lives in: House/apartment Stairs: Yes: Internal: 14 steps; on right going up and External: 14 steps; on left going up Has following equipment at home: Single point cane  PLOF: Independent with basic ADLs and Independent with gait  Enjoyed hiking 2-3 miles, foraging for mushrooms.  Joined the [employer] and has been swimming twice.  *Since CVA, mostly sits or lies on sofa during the day.  PATIENT GOALS: Pt's goals for therapy are to make pain stop, to get back to outdoor activities.  Wife's goal to pick up feet more.  OBJECTIVE:    TODAY'S TREATMENT: 10/12/2022 Activity Comments  In parallel bars, standing on Airex: -marching in place 2 x 10 -heel/toe raises 2 x 10 -forward step and weigthshift, back step and weightshift x 10, then forward>back step and weightshift x 10 reps each Minor LOB with RLE as stance  Forward step up for improved SLS, to airex, 10 reps each leg  Intermittent UE support  Forward stepping and side stepping over hurdles Occasional cues for larger/wider step length and foot clearance  Stepping around hurdles, stepping around, on and over balance dots and river stones Cues for slowed pace, increased SLS time, min assist>min guard/supervision  Outdoor gait over sidewalk, grass surfaces, negotiating gentle slope hills Cues for foot clearance ascending inclines and posture/heelstrike descending inclines, with supervision         PATIENT EDUCATION: Education details: Only attempt hikes or outdoor walking with wife's close supervision Person educated: Patient and Spouse Education method: Explanation, Demonstration, Verbal cues, and Handouts Education comprehension: verbalized understanding, returned demonstration, and needs further education  Access Code: [facility] URL: [email or web address] Date: 09/30/2022 Prepared by: [facility]  Exercises - Seated Hamstring Stretch  -  2 x daily - 7 x weekly - 1 sets - 3 reps - 30 sec hold - Seated Figure 4 Piriformis Stretch  - 1-2 x daily - 7 x weekly - 1 sets - 3 reps - 30 sec hold - Lower Trunk Rotations  - 1-2 x daily - 7 x weekly - 1 sets - 5 reps - 15-30 sec hold - Hooklying Single Knee to Chest Stretch  - 1-2 x daily - 7 x weekly - 1 sets - 3-5 reps - 15-30 hold - Side to side weightshift  - 1-2 x daily - 5 x weekly - 2 sets - 10 reps -------------------------------------------------------------------------------------- (Objective measures below taken at initial evaluation:)  DIAGNOSTIC FINDINGS:  (From rehab discharge note) CT scan showed loss of gray-white differentiation in the right insular cortex lateral aspect of the right lentiform and right temporal tip consistent with acute infarction.  No acute  hemorrhage.  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  Status post TNK as well as recanalization per interventional radiology.  MRI follow-up showed acute/subacute nonhemorrhagic infarct of the right caudate head and lentiform nucleus as noted on prior previous CT.   COGNITION: Overall cognitive status:  See speech notes; flat affect at eval   SENSATION: Light touch: WFL   MUSCLE LENGTH:  Passive from 90/90 position in supine Hamstrings: Right -30 deg; Left -40 deg   POSTURE: rounded shoulders, forward head, posterior pelvic tilt, and chooses to sit with posterior pelvic tilt, leaned back against wall.  LOWER EXTREMITY ROM:   Tightness in hamstrings, tenderness to palpation along superior aspect of IT band, especially greater trochanter; tightness at hip flexors in sidelying  LOWER EXTREMITY MMT:    MMT Right Eval Left Eval  Hip flexion 4+ 4  Hip extension    Hip abduction 3+ 4  Hip adduction 4 4  Hip internal rotation    Hip external rotation    Knee flexion 4 3+  Knee extension 4 4  Ankle dorsiflexion 3+ 3+  Ankle plantarflexion    Ankle inversion    Ankle  eversion    (Blank rows = not tested)  BED MOBILITY:  Independent, extra time  TRANSFERS: Assistive device utilized: None  Sit to stand: Modified independence Stand to sit: Modified independence  STAIRS: Level of Assistance: SBA Stair Negotiation Technique: Alternating Pattern  with Bilateral Rails Number of Stairs: 2-3  Height of Stairs: 4"-6"  Comments: Decreased foot clearance  GAIT: Gait pattern:  veers to R with gait challenges, step through pattern, decreased ankle dorsiflexion- Right, decreased ankle dorsiflexion- Left, genu recurvatum- Left, wide BOS, poor foot clearance- Right, and poor foot clearance- Left Distance walked: 50 ft x 4 Assistive device utilized: None Level of assistance: SBA Comments: veers to R with balance challenges  FUNCTIONAL TESTS:  10 meter walk test: 11.78 sec (2.78 ft/sec) self-selected; FAST:  8.47 sec = 3.87 ft/sec Functional gait assessment: 14/30 (scores <22/30 indicate increased fall risk); score has decreased from 17/30 at initial eval in hospital    PATIENT SURVEYS:  NA   M-CTSIB  Condition 1: Firm Surface, EO 30 Sec, Normal Sway  Condition 2: Firm Surface, EC 30 Sec, Mild Sway  Condition 3: Foam Surface, EO 30 Sec, Mild Sway  Condition 4: Foam Surface, EC 30 Sec, Mild Sway     GOALS: Goals reviewed with patient? Yes  SHORT TERM GOALS: Target date: 10/08/2022  Pt will be supervision with HEP for improved flexibility, strength, balance, and gait. Baseline: Goal status: IN PROGRESS  2.  Pt will report pain in hips at night <5/10, for improved sleep, bed mobility positioning. Baseline: 10/10 at night, interferes with sleep Goal status: IN PROGRESS   LONG TERM GOALS: Target date: 11/05/2022  Pt will be independent with HEP for improved balance, flexibility, strength, gait. Baseline:  Goal status: IN PROGRESS  2.  Pt will improve FGA score to at least 20/30 to decrease fall risk. Baseline: 14/30 Goal status: IN  PROGRESS  3.  Pt will improve gait velocity to at least 3 ft/sec self-selected speed. Baseline: 2.78 ft/sec Goal status: IN PROGRESS  4.  Pt will ambulate at least 1000 ft, indoor and outdoor surfaces, modified independently for return to outdoor gait hiking. Baseline:  Goal status: IN PROGRESS    ASSESSMENT:  CLINICAL IMPRESSION: Skilled PT session today continued focus on balance-especially for compliant surfaces and dynamic SLS.  Pt and wife report going on a hike at [facility] on Sunday-no significant LOB and no falls; however, wife does show video to PT of pt having unsteadiness with change of direction, scissoring feet to recover balance.  Pt continues to report no hip/back pain in session today, but wife reports pain is still present at night at times.  He will continue to benefit from skilled PT to progress HEP (current HEP is mostly for stretching and for lumbar exercises) and to continue towards goals for improved functional mobility, decreased fall risk.   OBJECTIVE IMPAIRMENTS: Abnormal gait, decreased balance, decreased knowledge of use of DME, decreased mobility, difficulty walking, decreased ROM, decreased strength, impaired flexibility, and pain.   ACTIVITY LIMITATIONS: standing, sleeping, transfers, bed mobility, and locomotion level  PARTICIPATION LIMITATIONS: community activity and fishing, hiking  PERSONAL FACTORS: 3+ comorbidities: see above  are also affecting patient's functional outcome.   REHAB POTENTIAL: Good  CLINICAL DECISION MAKING: Evolving/moderate complexity  EVALUATION COMPLEXITY: Moderate  PLAN:  PT FREQUENCY: 2x/week  PT DURATION: 6 weeks (including eval week)  PLANNED INTERVENTIONS: Therapeutic exercises, Therapeutic activity, Neuromuscular re-education, Balance training, Gait training, Patient/Family education, Self Care, Joint mobilization, Stair training, DME instructions, Cryotherapy, and Manual therapy  PLAN FOR NEXT SESSION: Assess  STGs; Work on turns; Additions to HEP:  sidestepping/resisted, monster walk, turns, braiding, step ups.   balance and gait training.  Mady Haagensen, PT 10/12/22 3:00 PM Phone: [phone] Fax: [phone]   [facility] at [facility] [address] [place], [place] [address] Phone # [phone] Fax # [phone]

## 2022-10-13 ENCOUNTER — Encounter: Payer: Self-pay | Admitting: Physical Therapy

## 2022-10-13 ENCOUNTER — Ambulatory Visit: Payer: Medicare HMO | Admitting: Physical Therapy

## 2022-10-13 ENCOUNTER — Ambulatory Visit: Payer: Medicare HMO

## 2022-10-13 ENCOUNTER — Encounter: Payer: Medicare HMO | Admitting: Occupational Therapy

## 2022-10-13 ENCOUNTER — Ambulatory Visit: Payer: Medicare HMO | Admitting: Occupational Therapy

## 2022-10-13 DIAGNOSIS — R2689 Other abnormalities of gait and mobility: Secondary | ICD-10-CM

## 2022-10-13 DIAGNOSIS — R41840 Attention and concentration deficit: Secondary | ICD-10-CM | POA: Diagnosis not present

## 2022-10-13 DIAGNOSIS — R2681 Unsteadiness on feet: Secondary | ICD-10-CM

## 2022-10-13 DIAGNOSIS — R1312 Dysphagia, oropharyngeal phase: Secondary | ICD-10-CM

## 2022-10-13 DIAGNOSIS — R41841 Cognitive communication deficit: Secondary | ICD-10-CM

## 2022-10-13 DIAGNOSIS — I69254 Hemiplegia and hemiparesis following other nontraumatic intracranial hemorrhage affecting left non-dominant side: Secondary | ICD-10-CM

## 2022-10-13 DIAGNOSIS — M6281 Muscle weakness (generalized): Secondary | ICD-10-CM

## 2022-10-13 NOTE — Therapy (Signed)
OUTPATIENT SPEECH LANGUAGE PATHOLOGY TREATMENT   Patient Name: Douglas Cox MRN: [id number] DOB:1951-02-12, 72 y.o., male Today's Date: 10/13/2022  PCP: None Recorded REFERRING PROVIDER: Alysia Penna, MD  END OF SESSION:  End of Session - 10/13/22 1900     Visit Number 16    Number of Visits 25    Date for SLP Re-Evaluation 11/09/22    SLP Start Time 1451    SLP Stop Time  1531    SLP Time Calculation (min) 40 min    Activity Tolerance Patient tolerated treatment well                  Past Medical History:  Diagnosis Date   Celiac disease    Retinal detachment    Stroke [facility])    Past Surgical History:  Procedure Laterality Date   CATARACT EXTRACTION Bilateral    Dr. Valetta Close   EYE SURGERY     FRACTURE SURGERY     GAS INSERTION Right 05/21/2021   Procedure: INSERTION OF GAS - C3F8;  Surgeon: Bernarda Caffey, MD;  Location: [place];  Service: Ophthalmology;  Laterality: Right;   GAS/FLUID EXCHANGE Right 05/21/2021   Procedure: GAS/FLUID EXCHANGE;  Surgeon: Bernarda Caffey, MD;  Location: [place];  Service: Ophthalmology;  Laterality: Right;   HERNIA REPAIR     IR CT HEAD LTD  07/17/2022   IR PERCUTANEOUS ART THROMBECTOMY/INFUSION INTRACRANIAL INC DIAG ANGIO  07/17/2022   IR US GUIDE VASC ACCESS RIGHT  07/17/2022   PARS PLANA VITRECTOMY Right 05/21/2021   Procedure: PARS PLANA VITRECTOMY WITH 25 GAUGE;  Surgeon: Bernarda Caffey, MD;  Location: [place];  Service: Ophthalmology;  Laterality: Right;   PERFLUORONE INJECTION Right 05/21/2021   Procedure: PERFLUORONE INJECTION;  Surgeon: Bernarda Caffey, MD;  Location: [place];  Service: Ophthalmology;  Laterality: Right;   PHOTOCOAGULATION WITH LASER Right 05/21/2021   Procedure: PHOTOCOAGULATION WITH LASER;  Surgeon: Bernarda Caffey, MD;  Location: [place];  Service: Ophthalmology;  Laterality: Right;   RADIOLOGY WITH ANESTHESIA N/A 07/17/2022   Procedure: IR WITH ANESTHESIA;  Surgeon: Radiologist, Medication, MD;  Location: [place];  Service: Radiology;  Laterality: N/A;   RETINAL DETACHMENT SURGERY     Patient Active Problem List   Diagnosis Date Noted   Paroxysmal atrial fibrillation ([place]) 09/01/2022   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 ([place]) 07/22/2022   Protein-calorie malnutrition, severe 07/19/2022   Stroke (cerebrum) ([place]) 07/17/2022   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   Celiac disease    Alcohol dependence ([place])    Bradycardia      ONSET DATE: 07/17/22   REFERRING DIAG: I63.81 (ICD-10-CM) - Other cerebral infarction due to occlusion or stenosis of small artery  THERAPY DIAG:  Cognitive communication deficit  Oropharyngeal dysphagia  Rationale for Evaluation and Treatment: Rehabilitation  SUBJECTIVE:   SUBJECTIVE STATEMENT: "Yesterday was a good energy day." [name])  Pt accompanied by: significant other [name] (wife)  PERTINENT HISTORY: Solon presented to the ED with acute onset of left hemiplegia, right gaze deviation and left hemineglect while working in his garage.  TNK given and recanalization acheived with thrombectomy.  Later at 1800 noted right sided weakness and new CT Head and CTA head and neck reordered and negative for reocclusion. MRI today shows bilateral basal ganglia infarcts. ASA '81mg'$  started 12/17  Pt with ETT for thrombectomy and overnight.  Extubated 12/17    PAIN: Are you having pain? Yes; back & hips (3/10)   PATIENT GOALS: Pt did not answer question when  asked, due to attention/processing deficits  OBJECTIVE:   DIAGNOSTIC FINDINGS:  FEES 07/30/22 Pt demonstrates a moderate oropharyngeal dysphagia that is impacted by patient's current cognitive deficits. Patient's oral phase is characterized by poor awareness of bolus resulting in inconsistent timing of swallowing trigger. With solids, patient needed verbal cues (snapping and counting) to attend and initiate a swallow with all boluses contained in the velleculae. With liquids, patient consistently triggered  his swallow at the pyriform sinuses. This resulted in sensed aspiration of thin liquids via cup. Patient also with inconsistent amounts of pharyngeal residue that cleared with a 2nd subsequent swallow. No penetration or aspiration noted with nectar-thick liquids except for 1 episode of flash penetration. Provided thorough education to the patient's wife regarding patient's current swallowing function and that he is currently at a higher risk of aspiration due to cognitive deficits. She verbalized understanding and agreement with recommendations of continuing Dys. 3 textures but upgrading to nectar-thick liquids via cup with strict aspiration precautions and supervision. This will hopefully improve patient's intake of liquids with improved ability to stay hydrated without IV fluids prior to potential discharge on 08/03/22. SLP Diet Recommendations Dysphagia 3 (Mech soft) solids;Nectar thick liquid Liquid Administration via Cup;Spoon;No straw Medication Administration Crushed with puree Compensations: Slow rate;Small sips/bites;Minimize environmental distractions;Multiple dry swallows after each bite/sip  Today, pt ate dys III and drank nectar liqiuds. See "clinicial impressions" for results.  STANDARDIZED ASSESSMENTS: CLQT: may need to be completed in first 1-3 sessions  PATIENT REPORTED OUTCOME MEASURES (PROM): Cognitive function: Short Form: to be administered in first 1-3 sessions   TODAY'S TREATMENT:                                                                                                                                          10/13/22: CLQT to occur next session. Given pt's interest in [place] strong voice SLP gave pt the choice of com-cog evaluation or voice exercises, and pt chose voice so SLP provided/educated wife and pt on PhoRTE exercises. Pt with WNL vocal quality with PhoRTE but without incr of volume >mid-upper 60s dB. He req'd usual cues for higher pitch production and lower  pitch production on numbers, and req'd occasional cues for higher and lower pitches with sentences. At end, SLP asked pt if he was not talking louder due to his perception is that he is already talking at [employer] volume, or that it is difficult for him to obtain WNL speech volume - pt stated perception so SLP used voice recorder to demo to pt that his voice at "projection/theater" level was equivalent to SLP volume. SLP encouraged pt to use PhoRTE daily and use "projection" voice for WNL loudness. SLP to cont to monitor this.    10/06/22: Reported increased fatigue today due to poor sleep. SLP and wife provided usual max prompting to engage patient in conversation. Intermittent mod cues provided to aid recall  and word finding in discussion of personally relevant information. Pt reportedly very distracted by phone and television at home. Pt noted to check phone/watch x5 this session but easily re-directed back to task without prompting. Wife reported pt experiencing difficulty with low pitch while singing. SLP educated and instructed pitch glides exercises (low to high; high to low), in which pt able to demonstrate adequate range with mod I. Targeted varying pitch while reading sentences, with usual mod A to generate different pitch (seemingly related to cognitive component versus voice). Frequent cues required to optimize volume to be intelligible today, even for single word responses. Recommended using old plays/scripts to target volume, projection, and inflection as pt was previously an avid Equities trader.   2/29/23: Pt needs CLQT and PROM. Pt with flat affect today, with seemingly dulled awareness -much moreso than last 3-4 sessions. Pt taking Tramadol to sleep in the past three nights due to pain. Today SLP targeted pt's selective attention and awareness with functional task - setting up [place]. Pt req'd cues for attention to SLP or wife's voice when asking him about setup, consistently, requiring x2 requests  to generate a verbal response, which was unintelligible 80% of the time today, requiring a third request from listener. Overall, pt was largely disengaged from verbal stimulation during ST session and was hyper-focused on the watch settings.   09/22/22: Pt demonstrated min-mod deficits with divided attention between singing song lyrics, strumming, singing, and playing chords. Chords suffered if pt sang more distinct lyrics and chords suffered if pt focused more on singing and/or strumming. Pt used alternating attention when SLP asked him questions; occasionally pt did not answer SLP, likely due to decr'd attention.  Pt's voice exhibited greater amount of WNL voice in higher pitches than lower pitches. SLP strongly urged pt to practice guitar at least 20 minutes a day and explained rationale.   09/20/22:  Pt needs CLQT and PROM. Pt upset about not finishing homework. Shanon Brow entered today after OT Flat affect. Attention targeted today in conversation. Pt held topic with WNL topic maintenance for 12 minutes, then 11 minutes. Pt is tracking his own meds correctly at this time. Does not have a pill organizer, just uses bottles. Pt cooking with wife at home - smoke alarm went off due to pt not turning stove down from searing fish. Wife stated smoke alarm is "sensitive", however SLP questions if pt should be cooking only with wife present in kitchen.    No coughing with meals/liquids. Taking meds whole with liquids now.   09/15/22: SLP targeted attention and verbal expression today in a functional task. Pt was able to sustain attention for 10 minutes in two conversational tasks. 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 Pt recalled he won bet from Monday and SLP owes him 25 cents.  09/13/22: Pt needs cognitive PROM, CLQT. Consider [place] Naming Test. Pt and wife stated pt coughed with corn chips last night so SLP assessed pt with peanut  butter crackers today without any overt s/sx aspiration or any oral stage difficulty, nor any other pharyngeal stage deficits. Pt deemed/appears safe with regular food items. Wife stated pt ate with bean dip but complained of pharyngeal stasis after coughing. SLP postulates if this was an anomalous incident as pt has not reported ANY coughing (nor wife endorsing any coughing) with POs.  Today SLP measured pt's loud /a/ with "hey" and with loud /a/ - variable  loudness responses (never >73dB) between 62-73 dB. Loud /a/ averaged in upper 60s dB with usual mod A for loudness. Pt tearful about his singing voice; initially pt's voice very tremulous and this subsided over time today. Pt told to practice 10 loud /a/ BID.  Pt recalled 3/7 details about this weekend's events. Wife with excellent cues for pt's memory. Today, pt initiated sentences with 3-4 words and then he paused for 5-8 seconds of silence prior to completing sentence; 20% of the time pt abandoned sentence/utterance.  09/08/22: Pt was doing bills this morning and called about a incr in their mortgage amount, independently. SLP asked pt to [facility] and pt able to do so (and noted SLP error) without 2/4 road names (aware of one error). Pt recalled 3/5 items from today's schedule prior to ST. He did not think or attempt to write down 3 medication names. He "bet" SLP he would remember to bring his vitamin D next session. Held attention for 8 minutes prior to observation of loss of attention. At times pt stops verbal expression as if he might be experiencing anomia.  SLP to consider administering [place] (BNT-2).   09/06/22: Pt was able to hold 7-8 minute conversation with SLP today in therapy, x3. Speech volume not as loud as Friday 09/03/22, and pt's processing appeared slower today in general than on Friday 09/03/22. Pt had busy weekend and made schedule for the day with [facility] assistance. 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 Pt to cont to follow schedule to encourage brain stimulating tasks at home.  CONSIDER ADMINISTRATION OF CLQT for obtaining a baseline of pt's cognitive linguistics.  09/03/22: Pt recalled 3 iof his questions and the answers from Dr. Naaman Plummer appointment spontaneously. SLP and wife provided the other two with min-mod A and total A. Pt recalled one answer from these two spontaneously. Pt held attention for 10 minutes in conversation about this appointment and about exercise.   08/31/22: Pt and wife deny cont'd overt s/sx aspiration with meals. Pt still does not take meds with water due to "They are bitter." [name] said pt has Dr. Naaman Plummer tomorrow so SLP had pt write down questions he has for Dr. Naaman Plummer. He thought of 2 spontaneously (5 with mod-max cues), and req'd consistent extra time to think of questions, mod A consistently for legible writing (decr'd emergent awareness), mod A usually for writing question in a format he would recall tomorrow (decr'd anticipatory awareness). [name] remarked that often she is having to encourage pt to get up from the chair to do things during the day. SLP strongly suggested a schedule and provided example of this, and also reiterated that pt will have decr'd ability to initiate tasks at this time and may require wife's cues to do daily tasks. SLP provided example of SLP calling pt's name in waiting room and him not arising until wife arises. Pt showing some incr'd awareness asking SLP if he will be able to return to things he was doing pre-CVA. 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  1/25:24: Wife asked if pt was safe for thin liquids. SLP told pt/wife that if pt took single sips and was not exhibiting coughing he could have thin liquids. SLP to assess next session. In a quiet environment,  pt completed written  math task for 9 minutes prior to requiring 5 minute break, evidenced by difficulty with/inability in adding simple figures. Pt impulsive about starting back to task again and req'd SLP cueing for longer break. After 6 minutes, pt said, "Get back to this again," and re-focused on written math task- processing speed improved for 3 more entries until pt had to leave. SLP educated/highlighted to pt and wife the importance of taking breaks when he feels "stuck".   08/24/22: speech/cognition: Pt demonstrated sustained attention for 7 minutes today for therapy task of writing down reasons for each med. He demonstrated attention adequate to recall 3/5 reasons 2 minutes later, when given extra time, and required mod A with other two. He continues to perform attention lengthening tasks at home. Wife has downloaded [employer] to her phone and has directed pt to complete. Pt rec'd cont'd rationale today for his meds - he "does not like taking pills." Pt inquired reason for CVA today in session and was told by wife and SLP. Today, pt's reasoning questionable with rationale to not take med/s with pureed but pt indicated he did not enjoy the smoothies wife was making for him with meds in them.  Swallowing: SLP discussed ways to assist pt in taking meds as they are currently being crushed and put into a smoothie for pt; "Do I have to have the smoothies" pt inquired. SLP suggested meds with applesauce, and yogurt, one pureed item at a time, and pt declined each time. SLP, pt, and wife eventually agreed pt may attempt one med with pureed item until next session.   PATIENT EDUCATION: Education details: see "today's treatment" Person educated: Patient and Spouse Education method: Explanation Education comprehension: verbalized understanding and needs further education   GOALS: Goals reviewed with patient? No  SHORT TERM GOALS: Target date: 2/9/2, 09/27/22 (revised 2/12/224)  Pt will follow aspiration precautions  from FEES with occasional min A in 2 sessions Baseline: Goal status: deferred - pt not coughing with POs  2.  Arthur will demo sustained attention to participate in 10 minute therapy task x3/session, in 3 sessions Baseline: 09/03/22, 09-20-22, 09/22/22 Goal status: met  3.  Pt will demo understanding of written cues in simple therapy tasks, with initial cue allowed, in 3 sessions Baseline: 08-19-22, 09-03-22 Goal status: Met  4.  Pt will demo selective attention for 10 minutes to a simple therapy task in a min-mod noisy environment Baseline:  Goal status: Not met  5.  Pt/wife will tell SLP 3 overt s/sx aspiration PNA with modified independence Baseline:  Goal status: Deferred - pt  no longer couhging with POs  6   Using compensations, pt will ask wife to give him his medications, at least once/day between 3 sessions Baseline:  Goal status: Met   LONG TERM GOALS: Target date: 11/09/22  Pt will follow aspiration precautions from FEES with rare min A in 3 sessions Baseline:  Goal status: Deferred   2.  Pt will follow aspiration precautions from FEES with modified independence in 2 sessions Baseline:  Goal status: deferred  3.  Pt will demo selective attention for 15 minutes to a simple therapy task in a min-mod noisy environment Baseline:  Goal status: Modified  4.  Pt will demo emergent awareness in simple therapy tasks with verbal cue for error awareness in 3 sessions Baseline:  Goal status: Ongoing  5.  Pt will demo alternating attention for 15 minutes to complete simple-mod complex therapy tasks 100% accuracy  given min cues, in 3 sessions Baseline:  Goal status: Modified  6.  Using compensations, pt will recall to administer his own medications correctly, at least once/day between 3 sessions Baseline:  Goal status: Ongoing  7.  Wife's PROM (due to pt's attention deficit) score for pt will improve in last 1-2 sessions than the score taken in the first 1-2 sessions Baseline:   Goal status: Ongoing  ASSESSMENT:  CLINICAL IMPRESSION: Patient is a 72 y.o. male who was seen today for treatment of simple cognition (primarily attention) in light of CVA 07-17-22. Pt swallowing is WNL, is now tracking his own meds correctly. SLP educated pt about PhoRTE today and encouraged completion daily, however pt's speech volume disorder may be more associated with perception of WNL voice rather than physical deficit with vocal folds or breath support.  OBJECTIVE IMPAIRMENTS: include attention, memory, awareness, executive functioning, receptive language, and dysphagia. These impairments are limiting patient from return to work, managing medications, managing appointments, managing finances, household responsibilities, ADLs/IADLs, effectively communicating at home and in community, and safety when swallowing. Factors affecting potential to achieve goals and functional outcome are ability to learn/carryover information, cooperation/participation level, and severity of impairments.. Patient will benefit from skilled SLP services to address above impairments and improve overall function.  REHAB POTENTIAL: Good  PLAN:  SLP FREQUENCY: 2x/week  SLP DURATION: 12 weeks  PLANNED INTERVENTIONS: Aspiration precaution training, Pharyngeal strengthening exercises, Diet toleration management , Environmental controls, Trials of upgraded texture/liquids, Cueing hierachy, Cognitive reorganization, Internal/external aids, Multimodal communication approach, SLP instruction and feedback, Compensatory strategies, and Patient/family education    [facility], [place], [place] 10/13/2022, 7:01 PM

## 2022-10-13 NOTE — Therapy (Signed)
OUTPATIENT PHYSICAL THERAPY NEURO TREATMENT NOTE   Patient Name: Douglas Cox MRN: [id number] DOB:May 16, 1951, 72 y.o., male Today's Date: 10/13/2022   PCP: Berneta Levins, DO REFERRING PROVIDER: Meredith Staggers, MD   END OF SESSION:  PT End of Session - 10/13/22 1601     Visit Number 5    Number of Visits 12    Date for PT Re-Evaluation 11/05/22    Authorization Type [employer]    Progress Note Due on Visit 10    PT Start Time 1619    PT Stop Time 1659    PT Time Calculation (min) 40 min    Equipment Utilized During Treatment Gait belt    Activity Tolerance Patient tolerated treatment well    Behavior During Therapy Flat affect;WFL for tasks assessed/performed               Past Medical History:  Diagnosis Date   Celiac disease    Retinal detachment    Stroke [facility])    Past Surgical History:  Procedure Laterality Date   CATARACT EXTRACTION Bilateral    Dr. Valetta Close   EYE SURGERY     FRACTURE SURGERY     GAS INSERTION Right 05/21/2021   Procedure: INSERTION OF GAS - C3F8;  Surgeon: Bernarda Caffey, MD;  Location: [place];  Service: Ophthalmology;  Laterality: Right;   GAS/FLUID EXCHANGE Right 05/21/2021   Procedure: GAS/FLUID EXCHANGE;  Surgeon: Bernarda Caffey, MD;  Location: [place];  Service: Ophthalmology;  Laterality: Right;   HERNIA REPAIR     IR CT HEAD LTD  07/17/2022   IR PERCUTANEOUS ART THROMBECTOMY/INFUSION INTRACRANIAL INC DIAG ANGIO  07/17/2022   IR US GUIDE VASC ACCESS RIGHT  07/17/2022   PARS PLANA VITRECTOMY Right 05/21/2021   Procedure: PARS PLANA VITRECTOMY WITH 25 GAUGE;  Surgeon: Bernarda Caffey, MD;  Location: [place];  Service: Ophthalmology;  Laterality: Right;   PERFLUORONE INJECTION Right 05/21/2021   Procedure: PERFLUORONE INJECTION;  Surgeon: Bernarda Caffey, MD;  Location: [place];  Service: Ophthalmology;  Laterality: Right;   PHOTOCOAGULATION WITH LASER Right 05/21/2021   Procedure: PHOTOCOAGULATION WITH LASER;  Surgeon: Bernarda Caffey, MD;  Location: [place];  Service: Ophthalmology;  Laterality: Right;   RADIOLOGY WITH ANESTHESIA N/A 07/17/2022   Procedure: IR WITH ANESTHESIA;  Surgeon: Radiologist, Medication, MD;  Location: [place];  Service: Radiology;  Laterality: N/A;   RETINAL DETACHMENT SURGERY     Patient Active Problem List   Diagnosis Date Noted   Paroxysmal atrial fibrillation ([place]) 09/01/2022   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 ([place]) 07/22/2022   Protein-calorie malnutrition, severe 07/19/2022   Stroke (cerebrum) ([place]) 07/17/2022   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   Celiac disease    Alcohol dependence ([place])    Bradycardia     ONSET DATE: 09/20/2022 (MD referral)  REFERRING DIAG:  Diagnosis  I63.81 (ICD-10-CM) - 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 ([place])    THERAPY DIAG:  Unsteadiness on feet  Other abnormalities of gait and mobility  Rationale for Evaluation and Treatment: Rehabilitation  SUBJECTIVE:  SUBJECTIVE STATEMENT: Did a little walk yesterday after therapy.  Couldn't walk too far.  Last night was a terrible night.  Getting in to see orthopedist on Friday.   Pt accompanied by: significant other  PERTINENT HISTORY: Mediastinal mass, bradycardia; basal ganglia CVA from MD notes, distant hx of A-fib, celiac disease, retinal detachment, HLD, HTN, sleep apnea.   From hospital notes:   Presented to ED w/ acute onset of left-sided weakness as well as right frontal headache.  Admitted to [facility] on 07/17/2022.  See imaging.   PAIN:   Are you having pain? Yes: NPRS scale: 2/10 Pain location: mid-back Pain description: stabbing Aggravating factors: lying on back Relieving factors: unsure      PRECAUTIONS: Fall  WEIGHT BEARING RESTRICTIONS: No  FALLS: Has patient fallen  in last 6 months? Yes. Number of falls 1  LIVING ENVIRONMENT: Lives with: lives with their spouse Lives in: House/apartment Stairs: Yes: Internal: 14 steps; on right going up and External: 14 steps; on left going up Has following equipment at home: Single point cane  PLOF: Independent with basic ADLs and Independent with gait  Enjoyed hiking 2-3 miles, foraging for mushrooms.  Joined the [facility] and has been swimming twice.  *Since CVA, mostly sits or lies on sofa during the day.  PATIENT GOALS: Pt's goals for therapy are to make pain stop, to get back to outdoor activities.  Wife's goal to pick up feet more.  OBJECTIVE:    TODAY'S TREATMENT: 10/13/2022 Activity Comments  Reviewed stretches: -seated piriformis stretch -supine hooklying SKTC -supine trunk rotation Able to perform/return demo good technique  Sit<>stand, 5 reps, then 5 reps standing on Airex No UE support  Forward step ups to 6" step, x 10 reps each leg, light UE support For improved SLS  Quarter turn practice to R and L, 5 reps each, then added quarter turn to initiation of short distance gait Added cognitive task and steps become smaller with turns  Forward/back walking 25 ft x 4 reps, then forward march 25 ft x 4 with tactile cues for increased step height Cues for wider BOS with turns  Resisted walking, 15#, then 10# at waist, forward/back x 3 reps with cues for upright posture, foot clearance>then without weight at waist, 2 reps forward/back with improved posture Improves with repetition         Access Code: [facility] URL: [email or web address] Date: 10/13/2022 Prepared by: [facility]  Exercises - Seated Hamstring Stretch  - 2 x daily - 7 x weekly - 1 sets - 3 reps - 30 sec hold - Seated Figure 4 Piriformis Stretch  - 1-2 x daily - 7 x weekly - 1 sets - 3 reps - 30 sec hold - Lower Trunk Rotations  - 1-2 x daily - 7 x weekly - 1 sets - 5 reps - 15-30 sec hold -  Hooklying Single Knee to Chest Stretch  - 1-2 x daily - 7 x weekly - 1 sets - 3-5 reps - 15-30 hold - Side to side weightshift  - 1-2 x daily - 5 x weekly - 2 sets - 10 reps - Standing Quarter Turn with Counter Support  - 1 x daily - 7 x weekly - 1 sets - 5-10 reps - Forward Step Up  - 1 x daily - 5 x weekly - 2 sets - 10 reps   PATIENT EDUCATION: Education details: Discussed consistency of stretches, especially at night before bed (nightly); also discussed  energy conservation/avoid overdoing too much activity in one day (ie-avoid doing hikes on days of therapy) Person educated: Patient and Spouse Education method: Explanation, Demonstration, Verbal cues, and Handouts Education comprehension: verbalized understanding, returned demonstration, and needs further education  -------------------------------------------------------------------------------------- (Objective measures below taken at initial evaluation:)  DIAGNOSTIC FINDINGS:  (From rehab discharge note) CT scan showed loss of gray-white differentiation in the right insular cortex lateral aspect of the right lentiform and right temporal tip consistent with acute infarction.  No acute hemorrhage.  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  Status post TNK as well as recanalization per interventional radiology.  MRI follow-up showed acute/subacute nonhemorrhagic infarct of the right caudate head and lentiform nucleus as noted on prior previous CT.   COGNITION: Overall cognitive status:  See speech notes; flat affect at eval   SENSATION: Light touch: WFL   MUSCLE LENGTH:  Passive from 90/90 position in supine Hamstrings: Right -30 deg; Left -40 deg   POSTURE: rounded shoulders, forward head, posterior pelvic tilt, and chooses to sit with posterior pelvic tilt, leaned back against wall.  LOWER EXTREMITY ROM:   Tightness in hamstrings, tenderness to palpation along superior aspect of IT band, especially  greater trochanter; tightness at hip flexors in sidelying  LOWER EXTREMITY MMT:    MMT Right Eval Left Eval  Hip flexion 4+ 4  Hip extension    Hip abduction 3+ 4  Hip adduction 4 4  Hip internal rotation    Hip external rotation    Knee flexion 4 3+  Knee extension 4 4  Ankle dorsiflexion 3+ 3+  Ankle plantarflexion    Ankle inversion    Ankle eversion    (Blank rows = not tested)  BED MOBILITY:  Independent, extra time  TRANSFERS: Assistive device utilized: None  Sit to stand: Modified independence Stand to sit: Modified independence  STAIRS: Level of Assistance: SBA Stair Negotiation Technique: Alternating Pattern  with Bilateral Rails Number of Stairs: 2-3  Height of Stairs: 4"-6"  Comments: Decreased foot clearance  GAIT: Gait pattern:  veers to R with gait challenges, step through pattern, decreased ankle dorsiflexion- Right, decreased ankle dorsiflexion- Left, genu recurvatum- Left, wide BOS, poor foot clearance- Right, and poor foot clearance- Left Distance walked: 50 ft x 4 Assistive device utilized: None Level of assistance: SBA Comments: veers to R with balance challenges  FUNCTIONAL TESTS:  10 meter walk test: 11.78 sec (2.78 ft/sec) self-selected; FAST:  8.47 sec = 3.87 ft/sec Functional gait assessment: 14/30 (scores <22/30 indicate increased fall risk); score has decreased from 17/30 at initial eval in hospital    PATIENT SURVEYS:  NA   M-CTSIB  Condition 1: Firm Surface, EO 30 Sec, Normal Sway  Condition 2: Firm Surface, EC 30 Sec, Mild Sway  Condition 3: Foam Surface, EO 30 Sec, Mild Sway  Condition 4: Foam Surface, EC 30 Sec, Mild Sway     GOALS: Goals reviewed with patient? Yes  SHORT TERM GOALS: Target date: 10/08/2022  Pt will be supervision with HEP for improved flexibility, strength, balance, and gait. Baseline: Goal status: MET, 10/13/2022  2.  Pt will report pain in hips at night <5/10, for improved sleep, bed mobility  positioning. Baseline: 10/13/22:  rates previous night as 8/10, previous night to that was 0/10  Goal status: GOAL PARTIALLY MET, 10/13/22   LONG TERM GOALS: Target date: 11/05/2022  Pt will be independent with HEP for improved balance, flexibility, strength, gait. Baseline:  Goal status:  IN PROGRESS  2.  Pt will improve FGA score to at least 20/30 to decrease fall risk. Baseline: 14/30 Goal status: IN PROGRESS  3.  Pt will improve gait velocity to at least 3 ft/sec self-selected speed. Baseline: 2.78 ft/sec Goal status: IN PROGRESS  4.  Pt will ambulate at least 1000 ft, indoor and outdoor surfaces, modified independently for return to outdoor gait hiking. Baseline:  Goal status: IN PROGRESS    ASSESSMENT:  CLINICAL IMPRESSION: Assessed STGs this visit, with pt meeting STG 1 and partially meeting STG 2.  Pt/wife report some nights are better and other nights are worse for pain.  Last night was particularly bad (of note, pt had PT session, then reports he went for a hike with wife); therefore, educated pt and wife in trying to avoid too much activity, too much exercise at one time.  Also educated in importance of consistency with stretching.  He is to see orthopedic MD for hip pain on Friday.  He is improving foot clearance with forward gait and still needs cues for foot clearance and posture in posterior direction.  Did not do as much repetition of lateral/hip abduction strengthening work, to try to avoid hip (bursitis?) pain today.   OBJECTIVE IMPAIRMENTS: Abnormal gait, decreased balance, decreased knowledge of use of DME, decreased mobility, difficulty walking, decreased ROM, decreased strength, impaired flexibility, and pain.   ACTIVITY LIMITATIONS: standing, sleeping, transfers, bed mobility, and locomotion level  PARTICIPATION LIMITATIONS: community activity and fishing, hiking  PERSONAL FACTORS: 3+ comorbidities: see above  are also affecting patient's functional outcome.    REHAB POTENTIAL: Good  CLINICAL DECISION MAKING: Evolving/moderate complexity  EVALUATION COMPLEXITY: Moderate  PLAN:  PT FREQUENCY: 2x/week  PT DURATION: 6 weeks (including eval week)  PLANNED INTERVENTIONS: Therapeutic exercises, Therapeutic activity, Neuromuscular re-education, Balance training, Gait training, Patient/Family education, Self Care, Joint mobilization, Stair training, DME instructions, Cryotherapy, and Manual therapy  PLAN FOR NEXT SESSION: Review additions to HEP; work on turns; try to work on hip strengthening-sidestepping/resisted, monster walk, turns, braiding, step ups.   balance and gait training.  Mady Haagensen, PT 10/13/22 5:12 PM Phone: [phone] Fax: [phone]   [facility] at [facility] [place], [place]  [place], [place] [address] Phone # [phone] Fax # [phone]

## 2022-10-13 NOTE — Patient Instructions (Signed)
    PHoRTE VOICE EXERCISES - DO TWICE EACH DAY  1) Hold "AHHHHHHHHHHHHH" 10 times   As long as you can as strong as you can - push from your abdominal muscles!   https://www.youtube.com/watch?v=zNUGeFDSWlc  2) Count 1-10 (skip 7)   Start at as low pitch as you can, glide to as high pitch as you can, then back down as low as you can   https://www.youtube.com/watch?v=08TLKEoFZuc  3) Say 10 sentences -two different ways   (a) like you are calling over the fence to your neighbor in a higher-pitch voice  (b) like you are scolding your dog in a LOW authoritative voice   https://www.youtube.com/watch?v=8aaHWfv32zc  

## 2022-10-13 NOTE — Therapy (Signed)
OUTPATIENT OCCUPATIONAL THERAPY  Treatment Note  Patient Name: Douglas Cox MRN: [id number] DOB:[birth year], 72 y.o., male Today's Date: 10/13/2022  PCP: Dr. Zara Council ([facility]) REFERRING PROVIDER: Charlett Blake, MD   END OF SESSION:  OT End of Session - 10/13/22 1539     Visit Number 13    Number of Visits 23    Date for OT Re-Evaluation 11/05/22    Authorization Type [employer]    OT Start Time 1536    OT Stop Time 1616    OT Time Calculation (min) 40 min    Activity Tolerance Patient tolerated treatment well    Behavior During Therapy Flat affect;Restless                         Past Medical History:  Diagnosis Date   Celiac disease    Retinal detachment    Stroke [facility])    Past Surgical History:  Procedure Laterality Date   CATARACT EXTRACTION Bilateral    Dr. Valetta Close   EYE SURGERY     FRACTURE SURGERY     GAS INSERTION Right 05/21/2021   Procedure: INSERTION OF GAS - C3F8;  Surgeon: Bernarda Caffey, MD;  Location: [place];  Service: Ophthalmology;  Laterality: Right;   GAS/FLUID EXCHANGE Right 05/21/2021   Procedure: GAS/FLUID EXCHANGE;  Surgeon: Bernarda Caffey, MD;  Location: [place];  Service: Ophthalmology;  Laterality: Right;   HERNIA REPAIR     IR CT HEAD LTD  07/17/2022   IR PERCUTANEOUS ART THROMBECTOMY/INFUSION INTRACRANIAL INC DIAG ANGIO  07/17/2022   IR US GUIDE VASC ACCESS RIGHT  07/17/2022   PARS PLANA VITRECTOMY Right 05/21/2021   Procedure: PARS PLANA VITRECTOMY WITH 25 GAUGE;  Surgeon: Bernarda Caffey, MD;  Location: [place];  Service: Ophthalmology;  Laterality: Right;   PERFLUORONE INJECTION Right 05/21/2021   Procedure: PERFLUORONE INJECTION;  Surgeon: Bernarda Caffey, MD;  Location: [place];  Service: Ophthalmology;  Laterality: Right;   PHOTOCOAGULATION WITH LASER Right 05/21/2021   Procedure: PHOTOCOAGULATION WITH LASER;  Surgeon: Bernarda Caffey, MD;  Location: [place];  Service: Ophthalmology;  Laterality: Right;   RADIOLOGY  WITH ANESTHESIA N/A 07/17/2022   Procedure: IR WITH ANESTHESIA;  Surgeon: Radiologist, Medication, MD;  Location: [place];  Service: Radiology;  Laterality: N/A;   RETINAL DETACHMENT SURGERY     Patient Active Problem List   Diagnosis Date Noted   Paroxysmal atrial fibrillation ([place]) 09/01/2022   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 ([place]) 07/22/2022   Protein-calorie malnutrition, severe 07/19/2022   Stroke (cerebrum) ([place]) 07/17/2022   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   Celiac disease    Alcohol dependence ([place])    Bradycardia     ONSET DATE: 08/04/22 (referral date)  REFERRING DIAG: I63.81 (ICD-10-CM) - Other cerebral infarction due to occlusion or stenosis of small artery  THERAPY DIAG:  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 Pt accompanied by: self and significant other (spouse)  PERTINENT HISTORY: 72 y.o. male with a PMHx of cataracts, celiac disease and retinal detachment who presents to the ED with acute onset of left hemiplegia, right gaze deviation and left hemineglect.MRI: Acute/subacute nonhemorrhagic infarct of the right caudate head and lentiform nucleus; acute/subacute nonhemorrhagic infarct of the left caudate head  and lentiform nucleus spanning the internal capsule; Acute/subacute nonhemorrhagic infarct of the cortex of the right temporal tip.  PRECAUTIONS: Fall  WEIGHT BEARING RESTRICTIONS: No  PAIN:  Are you having pain? No  FALLS: Has patient fallen in last 6 months? Yes. Number of falls 2 - 1 when he had the stroke he fell forward and hit his head and fell off a ladder  LIVING ENVIRONMENT: Lives with: lives with their spouse Lives in:  House/apartment Stairs: Yes: Internal: 16 steps; on right going up and External: 4 steps to front door, 16 steps from garage to main floor of home steps; on right going up Has following equipment at home: Single point cane  PLOF: Independent, working as an Pension scheme manager  PATIENT GOALS: to improve thought process, speech  OBJECTIVE:   HAND DOMINANCE: Right  ADLs: Overall ADLs: Independent with all bathing/dressing.    IADLs: Shopping: Not interested in going to the store, but would have gone prior to CVA Light housekeeping: is able to assist with household tasks when spouse asks for assistance or directs him to complete Meal Prep: Spouse is proving supervision for meal prep, but has been able to cook fish Community mobility: MD has not cleared pt to drive Medication management: Spouse administers medication, is having difficulty with administering B12 shot Financial management: Spouse is currently paying bills, but pt would have completed prior to CVA   COORDINATION: Finger Nose Finger test: mild ataxia, dysmetria LUE 9 Hole Peg test: Right: 32.37 sec; Left: 50.04 sec  Noted some difficulty with sequencing, requiring cues for recall and sequencing of directions Box and Blocks:  Right 38 blocks, Left 40 blocks (Pt frequently picking up 2-3 blocks at a time despite mod-max cues to pick up only one block at a time.    COGNITION: Overall cognitive status: Impaired Orientation Level: Person;Place;Situation Person: Oriented Place: Disoriented Situation: Disoriented Memory: Impaired Memory Impairment: Storage deficit;Retrieval deficit;Decreased recall of new information;Decreased short term memory Attention: Focused Awareness: Impaired Awareness Impairment: Intellectual impairment Problem Solving: Impaired Problem Solving Impairment: Verbal basic;Functional basic Behaviors: Restless;Impulsive Safety/Judgment: Impaired  OBSERVATIONS: Pt demonstrating attention at focused level during 9  hole peg test, requiring frequent cues and redirection to attend to 2 step task with Box and Blocks.  Pt demonstrating decreased interest and motivation during OT evaluation, however pt and spouse agreeable to continue with OT at this time to focus on memory and return to engagement in IADLs and leisure pursuits.  TODAY'S TREATMENT:                                  10/13/22 mental math to complete scheduling items and ensuring meal prep in timely manner (all items completed around same time). Make phone call in <90 seconds 2 mins alternating attention    10/05/22 Attention: Engaged in small peg board pattern replication with use of key to identify correlating colors, challenging alternating attention, and coordination. Pt demonstrating great manipulation of pegs in-hand to place in peg board. Pt making error about third of the way through activity and when OT mentions that an error may have occurred, pt voiced that he "did not care".  Upon completion of task, pt requiring line by line cues to locate errors and to correct.  Pt able to recognize errors when taken line by line but with decreased insight to functional carryover. Educated on functional carryover of task implications to relate to recent issues/concerns at home with  ordering incorrect mattress or possible errors with meal prep and/or check balancing.   Provide directions: OT directed pt to provide driving directions from clinic to frequented [employer] as well as from his home to [facility] park.  Pt initially providing a few options for directions to [employer], making changes to then identify most efficient route correctly.  Pt naming directions to [facility], unable to recall street name at destination but accurately describing which street.   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  Pt  tossing ball quickly, requiring cues for increased amplitude of toss to further challenge balance and dual tasking.  Pt only able to name one theater play, despite cues from spouse to identify additional.   10/01/22 Attention: engaged in peg board pattern replication while challenging pt to name songs by favorite artists.  Pt demonstrating ability to name 2-3 songs by Grateful Dead before requiring cues to identify additional 2-3 more.  Pt able to name [place] with significantly increased time.  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  Pt demonstrating impulsivity with placing of pegs, requiring gesture cues and removal of additional stimulus to allow pt to focus on naming of items while placing pegs.  Attention: modified activity to completing 12 piece jigsaw puzzle while singing along to familiar songs.  Pt demonstrating ability to sustain attention to task, however requiring ~4 mins to complete 12 piece puzzle.  Encouraged pt to name additional musical artists in this genre, with pt able to name up to 5 with increased time.  PATIENT EDUCATION: Education details: Educated on Estate agent during daily tasks Person educated: Patient and Spouse Education method: Explanation Education comprehension: verbalized understanding and needs further education  HOME EXERCISE PROGRAM: TBD   GOALS: Goals reviewed with patient? Yes  SHORT TERM GOALS: Target date: 10/15/22  Pt will be able to identify/verbalize steps and/or items needed to complete simple functional task (simple snack prep, laundry task, etc) with supervision. Baseline: Goal status: IN PROGRESS  2.  Patient will be able to maintain alternating attention for 2 mins during structured task without additional cues for attention.  Baseline:  Goal status: IN PROGRESS  3.  Pt will be able to utilize phone to locate contacts and make emergency calls as  needed without cues in <90 seconds. Baseline:  Goal status: IN PROGRESS   LONG TERM GOALS: Target date: 11/05/22  Pt will demonstrate and/or verbalize ability to sequence advanced meal prep task (to include having all meal items completed at same time) at Mod I level. Baseline:  Goal status: IN PROGRESS  2.  Pt will navigate a moderately busy environment, following multi-step commands with 90% accuracy to simulate return to community reintegration. Baseline:  Goal status: IN PROGRESS  3.  Pt will be able to utilize phone to look up phone number for physician and/or other frequently used number/location without cues in <90 seconds. Baseline:  Goal status: IN PROGRESS  4.  Pt will be able to verbalize wayfinding/directions from home to familiar locations in the area without cues. Baseline:  Goal status: IN PROGRESS  5.  Pt will demonstrate ability to maintain selective attention for 5 mins during structured task without additional cues for attention.   Baseline:  Goal status: IN PROGRESS    ASSESSMENT:  CLINICAL IMPRESSION: Pt  demonstrating decreased engagement in tasks this session, due to frustration with situation at home (wrong mattress delivered).  Pt with decreased insight into errors during table top task and functional implications. Pt also with decreased sequencing and ability to complete cognitive/motor dual task, largely due to decreased attention as well as recall.  PERFORMANCE DEFICITS: in functional skills including IADLs, coordination, Fine motor control, Gross motor control, body mechanics, endurance, decreased knowledge of precautions, decreased knowledge of use of DME, and UE functional use, cognitive skills including attention, energy/drive, memory, orientation, problem solving, safety awareness, sequencing, and temperament/personality, and psychosocial skills including coping strategies, habits, and routines and behaviors.   IMPAIRMENTS: are limiting patient from  IADLs and leisure.   CO-MORBIDITIES: may have co-morbidities  that affects occupational performance. Patient will benefit from skilled OT to address above impairments and improve overall function.  MODIFICATION OR ASSISTANCE TO COMPLETE EVALUATION: Min-Moderate modification of tasks or assist with assess necessary to complete an evaluation.  OT OCCUPATIONAL PROFILE AND HISTORY: Detailed assessment: Review of records and additional review of physical, cognitive, psychosocial history related to current functional performance.  CLINICAL DECISION MAKING: Moderate - several treatment options, min-mod task modification necessary  REHAB POTENTIAL: Good  EVALUATION COMPLEXITY: Moderate    PLAN:  OT FREQUENCY: 1-2x/week  OT DURATION: 6 weeks  PLANNED INTERVENTIONS: self care/ADL training, therapeutic exercise, therapeutic activity, balance training, functional mobility training, patient/family education, cognitive remediation/compensation, psychosocial skills training, energy conservation, coping strategies training, and DME and/or AE instructions  RECOMMENDED OTHER SERVICES: NA  CONSULTED AND AGREED WITH PLAN OF CARE: Patient and family member/caregiver  PLAN FOR NEXT SESSION: selective attention - functional tasks with providing directions, making phone calls, preparing simple meal.  Complex/simple organization/planning.  Dual tasking with playing guitar while singing, and/or mental math to complete scheduling items and ensuring meal prep in timely manner (all items completed around same time).   Simonne Come, OTR/L 10/13/2022, 4:38 PM

## 2022-10-14 ENCOUNTER — Ambulatory Visit: Payer: Medicare HMO | Admitting: Physical Therapy

## 2022-10-15 NOTE — Progress Notes (Signed)
[facility] Note  10/25/2022     CHIEF COMPLAINT Patient presents for Retina Follow Up  HISTORY OF PRESENT ILLNESS: Douglas Cox is a 72 y.o. male who presents to the clinic today for:   HPI     Retina Follow Up   Patient presents with  Retinal Break/Detachment.  In right eye.  This started 1 year ago.  Duration of 1 year.  Since onset it is stable.  I, the attending physician,  performed the HPI with the patient and updated documentation appropriately.        Comments   1 year retina follow up RD OD pt is reporting no vision changes noticed he has been having some redness around eye lids a long with some watering he had a stroke in December 2023      Last edited by Bernarda Caffey, MD on 10/27/2022 12:28 AM.     Pt had a stroke in December 2023, he is going to speech and occupational therapy, he states the stroke did not effect his vision, his wife states he has his eyes "wide open" all the time  Referring physician: No referring provider defined for this encounter.  HISTORICAL INFORMATION:  Selected notes from the [place] for eval of increased floaters/veil OS   CURRENT MEDICATIONS: No current outpatient medications on file. (Ophthalmic Drugs)   No current facility-administered medications for this visit. (Ophthalmic Drugs)   Current Outpatient Medications (Other)  Medication Sig   apixaban (ELIQUIS) 5 MG TABS tablet Take 1 tablet (5 mg total) by mouth 2 (two) times daily.   Cyanocobalamin (VITAMIN B-12 IJ) Inject as directed.   famotidine (PEPCID) 20 MG tablet Take 1 tablet (20 mg total) by mouth at bedtime.   linaclotide (LINZESS) 72 MCG capsule Take 1 capsule (72 mcg total) by mouth daily before breakfast.   magnesium oxide (MAG-OX) 400 (240 Mg) MG tablet Take 0.5 tablets (200 mg total) by mouth at bedtime.   pantoprazole (PROTONIX) 40 MG tablet Take 1 tablet (40 mg total) by mouth once daily each morning.    Probiotic Product (ALIGN) 4 MG CAPS    rosuvastatin (CRESTOR) 20 MG tablet Take 1 tablet (20 mg total) by mouth daily.   SYRINGE-NEEDLE, DISP, 3 ML (B-D 3CC LUER-LOK SYR 25GX1") 25G X 1" 3 ML MISC Use as directed for B12 injections every 2 weeks   traMADol (ULTRAM) 50 MG tablet Take 1 tablet (50 mg total) by mouth every 6 (six) hours as needed for pain   vitamin D3 (CHOLECALCIFEROL) 25 MCG tablet Take 1 tablet (1,000 Units total) by mouth daily.   No current facility-administered medications for this visit. (Other)   REVIEW OF SYSTEMS: ROS   Positive for: Cardiovascular, Eyes Negative for: Constitutional, Gastrointestinal, Neurological, Skin, Genitourinary, Musculoskeletal, HENT, Endocrine, Respiratory, Psychiatric, Allergic/Imm, Heme/Lymph Last edited by Parthenia Ames, COT on 10/25/2022  1:04 PM.      ALLERGIES Allergies  Allergen Reactions   Nitrous Oxide Other (See Comments)    Can cause blindness in right eye    Codone [Hydrocodone] Hives   Tylenol [Acetaminophen] Hives   Hydrocodone-Acetaminophen     Other Reaction(s): itching/hives   Oxycodone Hcl     Other Reaction(s): itching/hives   Clarithromycin Anxiety    Other Reaction(s): agitated   Gluten Meal Other (See Comments)    Stomach issues   Lactose Intolerance (Gi) Other (See Comments)    GI issues   PAST MEDICAL HISTORY Past Medical  History:  Diagnosis Date   Celiac disease    Retinal detachment    Stroke [facility])    Past Surgical History:  Procedure Laterality Date   CATARACT EXTRACTION Bilateral    Dr. Valetta Close   EYE SURGERY     FRACTURE SURGERY     GAS INSERTION Right 05/21/2021   Procedure: INSERTION OF GAS - C3F8;  Surgeon: Bernarda Caffey, MD;  Location: [place];  Service: Ophthalmology;  Laterality: Right;   GAS/FLUID EXCHANGE Right 05/21/2021   Procedure: GAS/FLUID EXCHANGE;  Surgeon: Bernarda Caffey, MD;  Location: [place];  Service: Ophthalmology;  Laterality: Right;   HERNIA REPAIR     IR CT HEAD  LTD  07/17/2022   IR PERCUTANEOUS ART THROMBECTOMY/INFUSION INTRACRANIAL INC DIAG ANGIO  07/17/2022   IR US GUIDE VASC ACCESS RIGHT  07/17/2022   PARS PLANA VITRECTOMY Right 05/21/2021   Procedure: PARS PLANA VITRECTOMY WITH 25 GAUGE;  Surgeon: Bernarda Caffey, MD;  Location: [place];  Service: Ophthalmology;  Laterality: Right;   PERFLUORONE INJECTION Right 05/21/2021   Procedure: PERFLUORONE INJECTION;  Surgeon: Bernarda Caffey, MD;  Location: [place];  Service: Ophthalmology;  Laterality: Right;   PHOTOCOAGULATION WITH LASER Right 05/21/2021   Procedure: PHOTOCOAGULATION WITH LASER;  Surgeon: Bernarda Caffey, MD;  Location: [place];  Service: Ophthalmology;  Laterality: Right;   RADIOLOGY WITH ANESTHESIA N/A 07/17/2022   Procedure: IR WITH ANESTHESIA;  Surgeon: Radiologist, Medication, MD;  Location: [place];  Service: Radiology;  Laterality: N/A;   RETINAL DETACHMENT SURGERY     FAMILY HISTORY History reviewed. No pertinent family history.  SOCIAL HISTORY Social History   Tobacco Use   Smoking status: Former    Types: Cigarettes    Quit date: 01/30/2006    Years since quitting: 16.7   Smokeless tobacco: Never  Vaping Use   Vaping Use: Never used  Substance Use Topics   Alcohol use: Not Currently    Alcohol/week: 4.0 - 5.0 standard drinks of alcohol    Types: 4 - 5 Shots of liquor per week    Comment: 3 glasses of whiskey per night.   Drug use: Not Currently    Frequency: 7.0 times per week    Types: Marijuana       OPHTHALMIC EXAM: Base Eye Exam     Visual Acuity (Snellen - Linear)       Right Left   Dist cc 20/20 -2 20/20   Dist ph cc NI     Correction: Glasses         Tonometry (Tonopen, 1:08 PM)       Right Left   Pressure 14 13         Pupils       Pupils Dark Light Shape React APD   Right PERRL 5 4 Round Brisk None   Left PERRL 4 3 Round Brisk None         Visual Fields       Left Right    Full Full         Extraocular Movement       Right Left     Full, Ortho Full, Ortho         Neuro/Psych     Oriented x3: Yes   Mood/Affect: Normal         Dilation     Both eyes: 2.5% Phenylephrine @ 1:08 PM           Slit Lamp and Fundus Exam     Slit  Lamp Exam       Right Left   Lids/Lashes Dermatochalasis - upper lid Dermatochalasis - upper lid   Conjunctiva/Sclera White and quiet White and quiet   Cornea Arcus, well healed temporal cataract wounds, trace PEE arcus, well healed temporal cataract wounds, trace PEE   Anterior Chamber deep and clear deep and clear   Iris Round and dilated Round and dilated   Lens PC IOL in excellent position, 1+Posterior capsular opacification PC IOL in good position, trace Posterior capsular opacification   Anterior Vitreous Post vitrectomy mild syneresis, no pigment, Posterior vitreous detachment, promin, vitreous condensations settled inferiorly         Fundus Exam       Right Left   Disc Pink and Sharp, Compact Pink and Sharp, Compact, mild tilt   C/D Ratio 0.2 0.2   Macula Flat, Blunted foveal reflex, trace ERM, RPE mottling, No heme or edema Flat, Blunted foveal reflex, mild RPE mottling and clumping, No heme or edema   Vessels attenuated, Tortuous attenuated, Tortuous   Periphery Retina re-attached; 360 peripheral laser changes. ORIGINALLY: Bullous nasal RD from [id number] with small HST at 0130; good cryo changes around 0130 tear Attached, mild pigmented paving stone degeneration IT quad, focal DBH 0300 periphery, No RT/RD           Refraction     Wearing Rx       Sphere Cylinder Axis Add   Right +0.25 Sphere  +2.50   Left -0.50 +0.75 117 +2.50           IMAGING AND PROCEDURES  Imaging and Procedures for 10/25/2022  OCT, Retina - OU - Both Eyes       Right Eye Quality was good. Central Foveal Thickness: 289. Progression has been stable. Findings include normal foveal contour, no IRF, no SRF, myopic contour, epiretinal membrane (Inferior retina re-attached,  trace ERM).   Left Eye Quality was good. Central Foveal Thickness: 264. Progression has been stable. Findings include normal foveal contour, no IRF, no SRF (Trace ERM).   Notes *Images captured and stored on drive  Diagnosis / Impression:  OD: NFP, no IRF; Inferior retina stably re-attached, tr ERM OS: NFP, no IRF/SRF  Clinical management:  See below  Abbreviations: NFP - Normal foveal profile. CME - cystoid macular edema. PED - pigment epithelial detachment. IRF - intraretinal fluid. SRF - subretinal fluid. EZ - ellipsoid zone. ERM - epiretinal membrane. ORA - outer retinal atrophy. ORT - outer retinal tubulation. SRHM - subretinal hyper-reflective material. IRHM - intraretinal hyper-reflective material            ASSESSMENT/PLAN:   ICD-10-CM   1. Right retinal detachment  H33.21 OCT, Retina - OU - Both Eyes    2. Posterior vitreous detachment of left eye  H43.812     3. Pseudophakia, both eyes  Z96.1     4. Dry eyes  H04.123      1. 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 PVD OS  - acute development of prominent floaters OS -- saw Dr. Katy Fitch on call on Saturday, 10.16.21  - prominent floaters / vitreous condensations remain settled inferiorly  - Discussed findings and prognosis   - No RT or RD on 360 scleral depressed exam  - Reviewed s/s of RT/RD  - Strict return precautions for any such RT/RD signs/symptoms   - f/u in 1 year --  DFE/OCT  3. Pseudophakia OU  - s/p CE/IOL OU (Dr. Valetta Close)  - IOLs in good position, doing well  - mild PCO OU  - monitor   4. Dry eyes OU - recommend artificial tears and lubricating ointment as needed   Ophthalmic Meds Ordered this visit:  No orders of the defined types were placed in this encounter.    Return in about 1 year (around 10/25/2023) for f/u RD OD, DFE, OCT.  There are no Patient Instructions on file for this visit.  This document serves as a record of services personally performed by Gardiner Sleeper, MD, PhD. It was created on their behalf by Orvan Falconer, an ophthalmic technician. The creation of this record is the provider's dictation and/or activities during the visit.    Electronically signed by: Orvan Falconer, OA, 10/27/22  12:28 AM  This document serves as a record of services personally performed by Gardiner Sleeper, MD, PhD. It was created on their behalf by San Jetty. Owens Shark, OA an ophthalmic technician. The creation of this record is the provider's dictation and/or activities during the visit.    Electronically signed by: San Jetty. Owens Shark, [place] 03.25.2024 12:28 AM  Gardiner Sleeper, M.D., Ph.D. Diseases & Surgery of the Retina and Vitreous [facility]  I have reviewed the above documentation for accuracy and completeness, and I agree with the above. Gardiner Sleeper, M.D., Ph.D. 10/27/22 12:29 AM  Abbreviations: M myopia (nearsighted); A astigmatism; H hyperopia (farsighted); P presbyopia; Mrx spectacle prescription;  CTL contact lenses; OD right eye; OS left eye; OU both eyes  XT exotropia; ET esotropia; PEK punctate epithelial keratitis; PEE punctate epithelial erosions; DES dry eye syndrome; MGD meibomian gland dysfunction; ATs artificial tears; PFAT's preservative free artificial tears; [place] nuclear sclerotic cataract; PSC posterior subcapsular cataract; ERM epi-retinal membrane; PVD posterior vitreous detachment; RD retinal  detachment; DM diabetes mellitus; DR diabetic retinopathy; NPDR non-proliferative diabetic retinopathy; PDR proliferative diabetic retinopathy; CSME clinically significant macular edema; DME diabetic macular edema; dbh dot blot hemorrhages; CWS cotton wool spot; POAG primary open angle glaucoma; C/D cup-to-disc ratio; HVF humphrey visual field; GVF goldmann visual field; OCT optical coherence tomography; IOP intraocular pressure; BRVO Branch retinal vein occlusion; CRVO central retinal vein occlusion; CRAO central retinal artery occlusion; BRAO branch retinal artery occlusion; RT retinal tear; SB scleral buckle; PPV pars plana vitrectomy; VH Vitreous hemorrhage; PRP panretinal laser photocoagulation; IVK intravitreal kenalog; VMT vitreomacular traction; MH Macular hole;  NVD neovascularization of the disc; NVE neovascularization elsewhere; AREDS age related eye disease study; ARMD age related macular degeneration; POAG primary open angle glaucoma; EBMD epithelial/anterior basement membrane dystrophy; ACIOL anterior chamber intraocular lens; IOL intraocular lens; PCIOL posterior chamber intraocular lens; Phaco/IOL phacoemulsification with intraocular lens placement; [place] photorefractive keratectomy; LASIK laser assisted in situ keratomileusis; HTN hypertension; DM diabetes mellitus; COPD chronic obstructive pulmonary disease

## 2022-10-18 NOTE — Telephone Encounter (Signed)
Spoke with spouse, [name], about patient's procedure. She wanted to be certain of the time frame for scheduling his endoscopy. She had the understanding from the cardiologist, the patient is okay to hold his anticoagulation for his dental procedure. She asks if this changes the plan to wait for 6 months before scheduling his procedure. When do you want his EGD scheduled? Also unsure if the patient is supposed to be taking both famotidine and pantoprazole. Confirmed the pantoprazole is to be taken in the morning 30 minutes before eating and the famotidine is to be taken in the evening.

## 2022-10-19 ENCOUNTER — Encounter: Payer: Medicare HMO | Admitting: Occupational Therapy

## 2022-10-19 ENCOUNTER — Ambulatory Visit: Payer: Medicare HMO | Admitting: Physical Therapy

## 2022-10-19 ENCOUNTER — Encounter: Payer: Self-pay | Admitting: Physical Therapy

## 2022-10-19 ENCOUNTER — Ambulatory Visit: Payer: Medicare HMO | Admitting: Occupational Therapy

## 2022-10-19 ENCOUNTER — Ambulatory Visit: Payer: Medicare HMO

## 2022-10-19 DIAGNOSIS — R2689 Other abnormalities of gait and mobility: Secondary | ICD-10-CM

## 2022-10-19 DIAGNOSIS — R41840 Attention and concentration deficit: Secondary | ICD-10-CM | POA: Diagnosis not present

## 2022-10-19 DIAGNOSIS — R2681 Unsteadiness on feet: Secondary | ICD-10-CM

## 2022-10-19 DIAGNOSIS — M6281 Muscle weakness (generalized): Secondary | ICD-10-CM

## 2022-10-19 DIAGNOSIS — I69318 Other symptoms and signs involving cognitive functions following cerebral infarction: Secondary | ICD-10-CM

## 2022-10-19 DIAGNOSIS — R41841 Cognitive communication deficit: Secondary | ICD-10-CM

## 2022-10-19 DIAGNOSIS — R1312 Dysphagia, oropharyngeal phase: Secondary | ICD-10-CM

## 2022-10-19 NOTE — Therapy (Signed)
OUTPATIENT OCCUPATIONAL THERAPY  Treatment Note  Patient Name: Douglas Cox MRN: [id number] DOB:[birth year], 72 y.o., male Today's Date: 10/19/2022  PCP: Dr. Zara Council ([facility]) REFERRING PROVIDER: Charlett Blake, MD   END OF SESSION:  OT End of Session - 10/19/22 1516     Visit Number 14    Number of Visits 23    Date for OT Re-Evaluation 11/05/22    Authorization Type [employer]    OT Start Time 1408    OT Stop Time 1448    OT Time Calculation (min) 40 min    Activity Tolerance Patient tolerated treatment well    Behavior During Therapy Flat affect;Restless                         Past Medical History:  Diagnosis Date   Celiac disease    Retinal detachment    Stroke [facility])    Past Surgical History:  Procedure Laterality Date   CATARACT EXTRACTION Bilateral    Dr. Valetta Close   EYE SURGERY     FRACTURE SURGERY     GAS INSERTION Right 05/21/2021   Procedure: INSERTION OF GAS - C3F8;  Surgeon: Bernarda Caffey, MD;  Location: [place];  Service: Ophthalmology;  Laterality: Right;   GAS/FLUID EXCHANGE Right 05/21/2021   Procedure: GAS/FLUID EXCHANGE;  Surgeon: Bernarda Caffey, MD;  Location: [place];  Service: Ophthalmology;  Laterality: Right;   HERNIA REPAIR     IR CT HEAD LTD  07/17/2022   IR PERCUTANEOUS ART THROMBECTOMY/INFUSION INTRACRANIAL INC DIAG ANGIO  07/17/2022   IR US GUIDE VASC ACCESS RIGHT  07/17/2022   PARS PLANA VITRECTOMY Right 05/21/2021   Procedure: PARS PLANA VITRECTOMY WITH 25 GAUGE;  Surgeon: Bernarda Caffey, MD;  Location: [place];  Service: Ophthalmology;  Laterality: Right;   PERFLUORONE INJECTION Right 05/21/2021   Procedure: PERFLUORONE INJECTION;  Surgeon: Bernarda Caffey, MD;  Location: [place];  Service: Ophthalmology;  Laterality: Right;   PHOTOCOAGULATION WITH LASER Right 05/21/2021   Procedure: PHOTOCOAGULATION WITH LASER;  Surgeon: Bernarda Caffey, MD;  Location: [place];  Service: Ophthalmology;  Laterality: Right;   RADIOLOGY  WITH ANESTHESIA N/A 07/17/2022   Procedure: IR WITH ANESTHESIA;  Surgeon: Radiologist, Medication, MD;  Location: [place];  Service: Radiology;  Laterality: N/A;   RETINAL DETACHMENT SURGERY     Patient Active Problem List   Diagnosis Date Noted   Paroxysmal atrial fibrillation ([place]) 09/01/2022   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 ([place]) 07/22/2022   Protein-calorie malnutrition, severe 07/19/2022   Stroke (cerebrum) ([place]) 07/17/2022   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   Celiac disease    Alcohol dependence ([place])    Bradycardia     ONSET DATE: 08/04/22 (referral date)  REFERRING DIAG: I63.81 (ICD-10-CM) - Other cerebral infarction due to occlusion or stenosis of small artery  THERAPY DIAG:  Attention and concentration deficit  Other symptoms and signs involving cognitive functions following cerebral infarction  Rationale for Evaluation and Treatment: Rehabilitation  SUBJECTIVE:   SUBJECTIVE STATEMENT: Pt's spouse requesting assistance with organizing medications. Pt accompanied by: self and significant other (spouse)  PERTINENT HISTORY: 72 y.o. male with a PMHx of cataracts, celiac disease and retinal detachment who presents to the ED with acute onset of left hemiplegia, right gaze deviation and left hemineglect.MRI: Acute/subacute nonhemorrhagic infarct of the right caudate head and lentiform nucleus; acute/subacute nonhemorrhagic infarct of the left caudate head and lentiform nucleus spanning the internal capsule; Acute/subacute nonhemorrhagic infarct of  the cortex of the right temporal tip.  PRECAUTIONS: Fall  WEIGHT BEARING RESTRICTIONS: No  PAIN:  Are you having pain? No  FALLS: Has patient fallen in last 6 months? Yes. Number of falls 2 - 1 when he had the stroke he fell forward and hit his head and fell off a ladder  LIVING ENVIRONMENT: Lives with: lives with their spouse Lives in: House/apartment Stairs: Yes: Internal: 16 steps; on right going up and  External: 4 steps to front door, 16 steps from garage to main floor of home steps; on right going up Has following equipment at home: Single point cane  PLOF: Independent, working as an Pension scheme manager  PATIENT GOALS: to improve thought process, speech  OBJECTIVE:   HAND DOMINANCE: Right  ADLs: Overall ADLs: Independent with all bathing/dressing.    IADLs: Shopping: Not interested in going to the store, but would have gone prior to CVA Light housekeeping: is able to assist with household tasks when spouse asks for assistance or directs him to complete Meal Prep: Spouse is proving supervision for meal prep, but has been able to cook fish Community mobility: MD has not cleared pt to drive Medication management: Spouse administers medication, is having difficulty with administering B12 shot Financial management: Spouse is currently paying bills, but pt would have completed prior to CVA   COORDINATION: Finger Nose Finger test: mild ataxia, dysmetria LUE 9 Hole Peg test: Right: 32.37 sec; Left: 50.04 sec  Noted some difficulty with sequencing, requiring cues for recall and sequencing of directions Box and Blocks:  Right 38 blocks, Left 40 blocks (Pt frequently picking up 2-3 blocks at a time despite mod-max cues to pick up only one block at a time.    COGNITION: Overall cognitive status: Impaired Orientation Level: Person;Place;Situation Person: Oriented Place: Disoriented Situation: Disoriented Memory: Impaired Memory Impairment: Storage deficit;Retrieval deficit;Decreased recall of new information;Decreased short term memory Attention: Focused Awareness: Impaired Awareness Impairment: Intellectual impairment Problem Solving: Impaired Problem Solving Impairment: Verbal basic;Functional basic Behaviors: Restless;Impulsive Safety/Judgment: Impaired  OBSERVATIONS: Pt demonstrating attention at focused level during 9 hole peg test, requiring frequent cues and redirection to attend to 2 step  task with Box and Blocks.  Pt demonstrating decreased interest and motivation during OT evaluation, however pt and spouse agreeable to continue with OT at this time to focus on memory and return to engagement in IADLs and leisure pursuits.  TODAY'S TREATMENT:                                  10/19/22 Medication management: OT facilitated arrangement of medications with pt and spouse to ensure safe completion, recall, and not overlapping medications that should not be given at same time. OT utilized Nurse, mental health to aid pt  in arranging medications and arranging system to allow for increased recall and carryover.  OT recommended use of color code system to identify AM/PM meds vs pill box to aid in recall of AM/PM and whether taken or not. Leisure pursuits: engaged in discussion about typical tasks that would occupy pt's time during the day.  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  Pt had recently reported "overwhelmed" to attempt returning to work shop and has not participated in much in regards to taking photographs.  OT encouraged pt to resume working with camera and taking pictures to then bring in for next session. Handwriting: OT directed pt to  write 10 items that he would take pictures of.  Pt requiring mod cues for recall and options to "jog" memory.  Pt then able to come up with 4 before requiring additional cues.  Pt's handwriting continues to be quite small and 90% legible when pt in a hurry and/or when not motivated by task.    10/13/22 Attention/organization:  engaged in organization with planning for a dinner party.  OT challenged pt to create a menu, making a grocery list, making a list of household tasks to complete prior to party, and then organizing order of items to ensure meal prepared and ready on time/at same time.  Pt demonstrating decreased organization when making lists requiring mod cues to keep on task.  Pt with most difficulty  organizing meal time, stating rice and broccoli are prepared in same manner.  Handwriting: pt continuing to demonstrate small size with writing, however much improved legibility with use of lined paper and intermittent cues to attend to quality of handwriting. Phone use: pt taking ~1 minute to locate spouse's phone number in contacts.  Noted pt opening facebook and then email messages when attempting to locate spouse's phone number.  Discussed short cuts to increase time spent and to maintain focused attention to task.  OT challenged pt to locate PCP office phone number.  Pt requiring increased time and requiring max cues for attention and problem solving with use of internet browser to locate.    10/05/22 Attention: Engaged in small peg board pattern replication with use of key to identify correlating colors, challenging alternating attention, and coordination. Pt demonstrating great manipulation of pegs in-hand to place in peg board. Pt making error about third of the way through activity and when OT mentions that an error may have occurred, pt voiced that he "did not care".  Upon completion of task, pt requiring line by line cues to locate errors and to correct.  Pt able to recognize errors when taken line by line but with decreased insight to functional carryover. Educated on functional carryover of task implications to relate to recent issues/concerns at home with ordering incorrect mattress or possible errors with meal prep and/or check balancing.   Provide directions: OT directed pt to provide driving directions from clinic to frequented [employer] as well as from his home to [facility] park.  Pt initially providing a few options for directions to [employer], making changes to then identify most efficient route correctly.  Pt naming directions to [facility] unable to recall street name at destination but accurately describing which street.   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  Pt tossing ball quickly, requiring cues for increased amplitude of toss to further challenge balance and dual tasking.  Pt only able to name one theater play, despite cues from spouse to identify additional.    PATIENT EDUCATION: Education details: Educated on Estate agent during daily tasks Person educated: Patient and Spouse Education method: Explanation Education comprehension: verbalized understanding and needs further education  HOME EXERCISE PROGRAM: TBD   GOALS: Goals reviewed with patient? Yes  SHORT TERM GOALS: Target date: 10/15/22  Pt will be able to identify/verbalize steps and/or items needed to complete simple functional task (simple snack prep, laundry task, etc) with supervision. Baseline: Goal status: Met - 10/13/22  2.  Patient will be able to maintain alternating attention for 2 mins during structured task  without additional cues for attention.  Baseline:  Goal status: IN PROGRESS  3.  Pt will be able to utilize phone to locate contacts and make emergency calls as needed without cues in <90 seconds. Baseline:  Goal status: Met - 10/13/22   LONG TERM GOALS: Target date: 11/05/22  Pt will demonstrate and/or verbalize ability to sequence advanced meal prep task (to include having all meal items completed at same time) at Mod I level. Baseline:  Goal status: IN PROGRESS  2.  Pt will navigate a moderately busy environment, following multi-step commands with 90% accuracy to simulate return to community reintegration. Baseline:  Goal status: IN PROGRESS  3.  Pt will be able to utilize phone to look up phone number for physician and/or other frequently used number/location without cues in <90 seconds. Baseline:  Goal status: IN PROGRESS  4.  Pt will be able to verbalize wayfinding/directions from home  to familiar locations in the area without cues. Baseline:  Goal status: IN PROGRESS  5.  Pt will demonstrate ability to maintain selective attention for 5 mins during structured task without additional cues for attention.   Baseline:  Goal status: IN PROGRESS    ASSESSMENT:  CLINICAL IMPRESSION: Pt requiring mod cues for attention to task with list making to increase size and legibility.  Pt demonstrating decreased motivation to engage in cognitive and formerly leisure tasks.  Pt requiring question cues and encouragement from spouse for additional information/ideas during list making task. Pt with decreased awareness or insight into challenges.  PERFORMANCE DEFICITS: in functional skills including IADLs, coordination, Fine motor control, Gross motor control, body mechanics, endurance, decreased knowledge of precautions, decreased knowledge of use of DME, and UE functional use, cognitive skills including attention, energy/drive, memory, orientation, problem solving, safety awareness, sequencing, and temperament/personality, and psychosocial skills including coping strategies, habits, and routines and behaviors.   IMPAIRMENTS: are limiting patient from IADLs and leisure.   CO-MORBIDITIES: may have co-morbidities  that affects occupational performance. Patient will benefit from skilled OT to address above impairments and improve overall function.  MODIFICATION OR ASSISTANCE TO COMPLETE EVALUATION: Min-Moderate modification of tasks or assist with assess necessary to complete an evaluation.  OT OCCUPATIONAL PROFILE AND HISTORY: Detailed assessment: Review of records and additional review of physical, cognitive, psychosocial history related to current functional performance.  CLINICAL DECISION MAKING: Moderate - several treatment options, min-mod task modification necessary  REHAB POTENTIAL: Good  EVALUATION COMPLEXITY: Moderate    PLAN:  OT FREQUENCY: 1-2x/week  OT DURATION: 6  weeks  PLANNED INTERVENTIONS: self care/ADL training, therapeutic exercise, therapeutic activity, balance training, functional mobility training, patient/family education, cognitive remediation/compensation, psychosocial skills training, energy conservation, coping strategies training, and DME and/or AE instructions  RECOMMENDED OTHER SERVICES: NA  CONSULTED AND AGREED WITH PLAN OF CARE: Patient and family member/caregiver  PLAN FOR NEXT SESSION: selective attention - functional tasks with providing directions, making phone calls, preparing simple meal.  Complex/simple organization/planning.  Dual tasking with playing guitar while singing.  Discuss photography and identify areas keeping pt from engaging in leisure tasks.   Simonne Come, OTR/L 10/19/2022, 3:17 PM

## 2022-10-19 NOTE — Therapy (Signed)
OUTPATIENT SPEECH LANGUAGE PATHOLOGY TREATMENT   Patient Name: Douglas Cox MRN: [id number] DOB:04-Feb-1951, 72 y.o., male Today's Date: 10/19/2022  PCP: None Recorded REFERRING PROVIDER: Alysia Penna, Cox  END OF SESSION:  End of Session - 10/19/22 2315     Visit Number 17    Number of Visits 25    Date for SLP Re-Evaluation 11/09/22    SLP Start Time 1319    SLP Stop Time  1400    SLP Time Calculation (min) 41 min    Activity Tolerance Patient tolerated treatment well                   Past Medical History:  Diagnosis Date   Celiac disease    Retinal detachment    Stroke [name])    Past Surgical History:  Procedure Laterality Date   CATARACT EXTRACTION Bilateral    Dr. Valetta Cox   EYE SURGERY     FRACTURE SURGERY     GAS INSERTION Right 05/21/2021   Procedure: INSERTION OF GAS - C3F8;  Surgeon: Douglas Caffey, Cox;  Location: [place];  Service: Ophthalmology;  Laterality: Right;   GAS/FLUID EXCHANGE Right 05/21/2021   Procedure: GAS/FLUID EXCHANGE;  Surgeon: Douglas Caffey, Cox;  Location: [place];  Service: Ophthalmology;  Laterality: Right;   HERNIA REPAIR     IR CT HEAD LTD  07/17/2022   IR PERCUTANEOUS ART THROMBECTOMY/INFUSION INTRACRANIAL INC DIAG ANGIO  07/17/2022   IR US GUIDE VASC ACCESS RIGHT  07/17/2022   PARS PLANA VITRECTOMY Right 05/21/2021   Procedure: PARS PLANA VITRECTOMY WITH 25 GAUGE;  Surgeon: Douglas Caffey, Cox;  Location: [place];  Service: Ophthalmology;  Laterality: Right;   PERFLUORONE INJECTION Right 05/21/2021   Procedure: PERFLUORONE INJECTION;  Surgeon: Douglas Caffey, Cox;  Location: [place];  Service: Ophthalmology;  Laterality: Right;   PHOTOCOAGULATION WITH LASER Right 05/21/2021   Procedure: PHOTOCOAGULATION WITH LASER;  Surgeon: Douglas Caffey, Cox;  Location: [place];  Service: Ophthalmology;  Laterality: Right;   RADIOLOGY WITH ANESTHESIA N/A 07/17/2022   Procedure: IR WITH ANESTHESIA;  Surgeon: Douglas Cox;  Location:  [place];  Service: Radiology;  Laterality: N/A;   RETINAL DETACHMENT SURGERY     Patient Active Problem List   Diagnosis Date Noted   Paroxysmal atrial fibrillation ([place]) 09/01/2022   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 ([place]) 07/22/2022   Protein-calorie malnutrition, severe 07/19/2022   Stroke (cerebrum) ([place]) 07/17/2022   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   Celiac disease    Alcohol dependence ([place])    Bradycardia      ONSET DATE: 07/17/22   REFERRING DIAG: I63.81 (ICD-10-CM) - 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  Rationale for Evaluation and Treatment: Rehabilitation  SUBJECTIVE:   SUBJECTIVE STATEMENT: "Letter? I only know one letter." Douglas Cox)  Pt accompanied by: significant other Douglas Cox (wife)  PERTINENT HISTORY: Magic presented to the ED with acute onset of left hemiplegia, right gaze deviation and left hemineglect while working in his garage.  TNK given and recanalization acheived with thrombectomy.  Later at 1800 noted right sided weakness and new CT Head and CTA head and neck reordered and negative for reocclusion. MRI today shows bilateral basal ganglia infarcts. ASA 81mg  started 12/17  Pt with ETT for thrombectomy and overnight.  Extubated 12/17    PAIN: Are you having pain? Yes; back & hips (3/10)   PATIENT GOALS: Pt did not answer question when asked, due  to attention/processing deficits  OBJECTIVE:   DIAGNOSTIC FINDINGS:  FEES 07/30/22 Pt demonstrates a moderate oropharyngeal dysphagia that is impacted by patient's current cognitive deficits. Patient's oral phase is characterized by poor awareness of bolus resulting in inconsistent timing of swallowing trigger. With solids, patient needed verbal cues (snapping and counting) to attend and initiate a swallow with all boluses contained in the velleculae. With liquids, patient consistently triggered his swallow at the pyriform sinuses.  This resulted in sensed aspiration of thin liquids via cup. Patient also with inconsistent amounts of pharyngeal residue that cleared with a 2nd subsequent swallow. No penetration or aspiration noted with nectar-thick liquids except for 1 episode of flash penetration. Provided thorough education to the patient's wife regarding patient's current swallowing function and that he is currently at a higher risk of aspiration due to cognitive deficits. She verbalized understanding and agreement with recommendations of continuing Dys. 3 textures but upgrading to nectar-thick liquids via cup with strict aspiration precautions and supervision. This will hopefully improve patient's intake of liquids with improved ability to stay hydrated without IV fluids prior to potential discharge on 08/03/22. SLP Diet Recommendations Dysphagia 3 (Mech soft) solids;Nectar thick liquid Liquid Administration via Cup;Spoon;No straw Medication Administration Crushed with puree Compensations: Slow rate;Small sips/bites;Minimize environmental distractions;Multiple dry swallows after each bite/sip  Today, pt ate dys III and drank nectar liqiuds. See "clinicial impressions" for results.  STANDARDIZED ASSESSMENTS: CLQT: may need to be completed in first 1-3 sessions  PATIENT REPORTED OUTCOME MEASURES (PROM): Cognitive function: Short Form: to be administered in first 1-3 sessions   TODAY'S TREATMENT:                                                                                                                                          10/19/22: Flat affect. SLP worked with pt on stronger voice by recording and having him listen back to recording. Pt expressed no spontaneous verbal or visual feedback on whether he thought voice was loud enough or too soft unless SLP asked him. Pt thought was adequate however 80% of productions were sub-WNL volume. Pt read paragraphs, and produced spontaneous sentence responses today. Pt to practice with  louder more intentional voice with his play he is doing table read for later this month.   10/13/22: CLQT to occur next session. Given pt's interest in [place] strong voice SLP gave pt the choice of com-cog evaluation or voice exercises, and pt chose voice so SLP provided/educated wife and pt on PhoRTE exercises. Pt with WNL vocal quality with PhoRTE but without incr of volume >mid-upper 60s dB. He req'd usual cues for higher pitch production and lower pitch production on numbers, and req'd occasional cues for higher and lower pitches with sentences. At end, SLP asked pt if he was not talking louder due to his perception is that he is already talking at [facility] volume, or that it is difficult  for him to obtain WNL speech volume - pt stated perception so SLP used voice recorder to demo to pt that his voice at "projection/theater" level was equivalent to SLP volume. SLP encouraged pt to use PhoRTE daily and use "projection" voice for WNL loudness. SLP to cont to monitor this.    10/06/22: Reported increased fatigue today due to poor sleep. SLP and wife provided usual max prompting to engage patient in conversation. Intermittent mod cues provided to aid recall and word finding in discussion of personally relevant information. Pt reportedly very distracted by phone and television at home. Pt noted to check phone/watch x5 this session but easily re-directed back to task without prompting. Wife reported pt experiencing difficulty with low pitch while singing. SLP educated and instructed pitch glides exercises (low to high; high to low), in which pt able to demonstrate adequate range with mod I. Targeted varying pitch while reading sentences, with usual mod A to generate different pitch (seemingly related to cognitive component versus voice). Frequent cues required to optimize volume to be intelligible today, even for single word responses. Recommended using old plays/scripts to target volume, projection, and inflection as  pt was previously an avid Equities trader.   2/29/23: Pt needs CLQT and PROM. Pt with flat affect today, with seemingly dulled awareness -much moreso than last 3-4 sessions. Pt taking Tramadol to sleep in the past three nights due to pain. Today SLP targeted pt's selective attention and awareness with functional task - setting up [place]. Pt req'd cues for attention to SLP or wife's voice when asking him about setup, consistently, requiring x2 requests to generate a verbal response, which was unintelligible 80% of the time today, requiring a third request from listener. Overall, pt was largely disengaged from verbal stimulation during ST session and was hyper-focused on the watch settings.   09/22/22: Pt demonstrated min-mod deficits with divided attention between singing song lyrics, strumming, singing, and playing chords. Chords suffered if pt sang more distinct lyrics and chords suffered if pt focused more on singing and/or strumming. Pt used alternating attention when SLP asked him questions; occasionally pt did not answer SLP, likely due to decr'd attention.  Pt's voice exhibited greater amount of WNL voice in higher pitches than lower pitches. SLP strongly urged pt to practice guitar at least 20 minutes a day and explained rationale.   09/20/22:  Pt needs CLQT and PROM. Pt upset about not finishing homework. Shanon Brow entered today after OT Flat affect. Attention targeted today in conversation. Pt held topic with WNL topic maintenance for 12 minutes, then 11 minutes. Pt is tracking his own meds correctly at this time. Does not have a pill organizer, just uses bottles. Pt cooking with wife at home - smoke alarm went off due to pt not turning stove down from searing fish. Wife stated smoke alarm is "sensitive", however SLP questions if pt should be cooking only with wife present in kitchen.    No coughing with meals/liquids. Taking meds whole with liquids now.   09/15/22: SLP targeted attention and verbal  expression today in a functional task. Pt was able to sustain attention for 10 minutes in two conversational tasks. 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 Pt recalled he won bet from Monday and SLP owes him 25 cents.  09/13/22: Pt needs cognitive PROM, CLQT. Consider [place] Naming Test. Pt and wife stated pt coughed with corn chips last night so  SLP assessed pt with peanut butter crackers today without any overt s/sx aspiration or any oral stage difficulty, nor any other pharyngeal stage deficits. Pt deemed/appears safe with regular food items. Wife stated pt ate with bean dip but complained of pharyngeal stasis after coughing. SLP postulates if this was an anomalous incident as pt has not reported ANY coughing (nor wife endorsing any coughing) with POs.  Today SLP measured pt's loud /a/ with "hey" and with loud /a/ - variable loudness responses (never >73dB) between 62-73 dB. Loud /a/ averaged in upper 60s dB with usual mod A for loudness. Pt tearful about his singing voice; initially pt's voice very tremulous and this subsided over time today. Pt told to practice 10 loud /a/ BID.  Pt recalled 3/7 details about this weekend's events. Wife with excellent cues for pt's memory. Today, pt initiated sentences with 3-4 words and then he paused for 5-8 seconds of silence prior to completing sentence; 20% of the time pt abandoned sentence/utterance.  09/08/22: Pt was doing bills this morning and called about a incr in their mortgage amount, independently. SLP asked pt to [facility] and pt able to do so (and noted SLP error) without 2/4 road names (aware of one error). Pt recalled 3/5 items from today's schedule prior to ST. He did not think or attempt to write down 3 medication names. He "bet" SLP he would remember to bring his vitamin D next session. Held attention for 8 minutes prior to observation of loss of  attention. At times pt stops verbal expression as if he might be experiencing anomia.  SLP to consider administering [place] (BNT-2).   09/06/22: Pt was able to hold 7-8 minute conversation with SLP today in therapy, x3. Speech volume not as loud as Friday 09/03/22, and pt's processing appeared slower today in general than on Friday 09/03/22. Pt had busy weekend and made schedule for the day with [facility] assistance. 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 Pt to cont to follow schedule to encourage brain stimulating tasks at home.  CONSIDER ADMINISTRATION OF CLQT for obtaining a baseline of pt's cognitive linguistics.  09/03/22: Pt recalled 3 iof his questions and the answers from Dr. Naaman Plummer appointment spontaneously. SLP and wife provided the other two with min-mod A and total A. Pt recalled one answer from these two spontaneously. Pt held attention for 10 minutes in conversation about this appointment and about exercise.   08/31/22: Pt and wife deny cont'd overt s/sx aspiration with meals. Pt still does not take meds with water due to "They are bitter." Douglas Cox said pt has Dr. Naaman Plummer tomorrow so SLP had pt write down questions he has for Dr. Naaman Plummer. He thought of 2 spontaneously (5 with mod-max cues), and req'd consistent extra time to think of questions, mod A consistently for legible writing (decr'd emergent awareness), mod A usually for writing question in a format he would recall tomorrow (decr'd anticipatory awareness). Douglas Cox remarked that often she is having to encourage pt to get up from the chair to do things during the day. SLP strongly suggested a schedule and provided example of this, and also reiterated that pt will have decr'd ability to initiate tasks at this time and may require wife's cues to do daily tasks. SLP provided example of SLP calling pt's name in waiting room and him  not  arising until wife arises. Pt showing some incr'd awareness asking SLP if he will be able to return to things he was doing pre-CVA. 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  1/25:24: Wife asked if pt was safe for thin liquids. SLP told pt/wife that if pt took single sips and was not exhibiting coughing he could have thin liquids. SLP to assess next session. In a quiet environment, pt completed written math task for 9 minutes prior to requiring 5 minute break, evidenced by difficulty with/inability in adding simple figures. Pt impulsive about starting back to task again and req'd SLP cueing for longer break. After 6 minutes, pt said, "Get back to this again," and re-focused on written math task- processing speed improved for 3 more entries until pt had to leave. SLP educated/highlighted to pt and wife the importance of taking breaks when he feels "stuck".   08/24/22: speech/cognition: Pt demonstrated sustained attention for 7 minutes today for therapy task of writing down reasons for each med. He demonstrated attention adequate to recall 3/5 reasons 2 minutes later, when given extra time, and required mod A with other two. He continues to perform attention lengthening tasks at home. Wife has downloaded [employer] to her phone and has directed pt to complete. Pt rec'd cont'd rationale today for his meds - he "does not like taking pills." Pt inquired reason for CVA today in session and was told by wife and SLP. Today, pt's reasoning questionable with rationale to not take med/s with pureed but pt indicated he did not enjoy the smoothies wife was making for him with meds in them.  Swallowing: SLP discussed ways to assist pt in taking meds as they are currently being crushed and put into a smoothie for pt; "Do I have to have the smoothies" pt inquired. SLP suggested meds with applesauce, and yogurt, one pureed item at a time, and pt declined each time.  SLP, pt, and wife eventually agreed pt may attempt one med with pureed item until next session.   PATIENT EDUCATION: Education details: see "today's treatment" Person educated: Patient and Spouse Education method: Explanation Education comprehension: verbalized understanding and needs further education   GOALS: Goals reviewed with patient? No  SHORT TERM GOALS: Target date: 2/9/2, 09/27/22 (revised 2/12/224)  Pt will follow aspiration precautions from FEES with occasional min A in 2 sessions Baseline: Goal status: deferred - pt not coughing with POs  2.  [place] will demo sustained attention to participate in 10 minute therapy task x3/session, in 3 sessions Baseline: 09/03/22, 09-20-22, 09/22/22 Goal status: met  3.  Pt will demo understanding of written cues in simple therapy tasks, with initial cue allowed, in 3 sessions Baseline: 08-19-22, 09-03-22 Goal status: Met  4.  Pt will demo selective attention for 10 minutes to a simple therapy task in a min-mod noisy environment Baseline:  Goal status: Not met  5.  Pt/wife will tell SLP 3 overt s/sx aspiration PNA with modified independence Baseline:  Goal status: Deferred - pt  no longer couhging with POs  6   Using compensations, pt will ask wife to give him his medications, at least once/day between 3 sessions Baseline:  Goal status: Met   LONG TERM GOALS: Target date: 11/09/22  Pt will follow aspiration precautions from FEES with rare min A in 3 sessions Baseline:  Goal status: Deferred   2.  Pt will follow aspiration precautions from FEES  with modified independence in 2 sessions Baseline:  Goal status: deferred  3.  Pt will demo selective attention for 15 minutes to a simple therapy task in a min-mod noisy environment Baseline:  Goal status: Modified  4.  Pt will demo emergent awareness in simple therapy tasks with verbal cue for error awareness in 3 sessions Baseline:  Goal status: Ongoing  5.  Pt will demo alternating  attention for 15 minutes to complete simple-mod complex therapy tasks 100% accuracy given min cues, in 3 sessions Baseline: 10/19/22 (tax info) Goal status: Modified  6.  Using compensations, pt will recall to administer his own medications correctly, at least once/day between 3 sessions Baseline:  Goal status: Ongoing  7.  Wife's PROM (due to pt's attention deficit) score for pt will improve in last 1-2 sessions than the score taken in the first 1-2 sessions Baseline:  Goal status: Ongoing  ASSESSMENT:  CLINICAL IMPRESSION: Patient is a 72 y.o. male who was seen today for treatment of simple cognition (primarily attention) in light of CVA 07-17-22. Pt swallowing is WNL, is now tracking his own meds correctly. SLP educated pt about PhoRTE today and encouraged completion daily, however pt's speech volume disorder may be more associated with perception of WNL voice rather than physical deficit with vocal folds or breath support.  OBJECTIVE IMPAIRMENTS: include attention, memory, awareness, executive functioning, receptive language, and dysphagia. These impairments are limiting patient from return to work, managing medications, managing appointments, managing finances, household responsibilities, ADLs/IADLs, effectively communicating at home and in community, and safety when swallowing. Factors affecting potential to achieve goals and functional outcome are ability to learn/carryover information, cooperation/participation level, and severity of impairments.. Patient will benefit from skilled SLP services to address above impairments and improve overall function.  REHAB POTENTIAL: Good  PLAN:  SLP FREQUENCY: 2x/week  SLP DURATION: 12 weeks  PLANNED INTERVENTIONS: Aspiration precaution training, Pharyngeal strengthening exercises, Diet toleration management , Environmental controls, Trials of upgraded texture/liquids, Cueing hierachy, Cognitive reorganization, Internal/external aids, Multimodal  communication approach, SLP instruction and feedback, Compensatory strategies, and Patient/family education    [facility], [place] 10/19/2022, 11:16 PM

## 2022-10-19 NOTE — Therapy (Signed)
OUTPATIENT PHYSICAL THERAPY NEURO TREATMENT NOTE   Patient Name: Douglas Cox MRN: [id number] DOB:05-Jul-1951, 72 y.o., male Today's Date: 10/20/2022   PCP: Berneta Levins, DO REFERRING PROVIDER: Meredith Staggers, MD   END OF SESSION:  PT End of Session - 10/19/22 1241     Visit Number 6    Number of Visits 12    Date for PT Re-Evaluation 11/05/22    Authorization Type [employer]    Progress Note Due on Visit 10    PT Start Time 1242   pt arrives late   PT Stop Time 1314    PT Time Calculation (min) 32 min    Equipment Utilized During Treatment Gait belt    Activity Tolerance Patient tolerated treatment well    Behavior During Therapy Flat affect;WFL for tasks assessed/performed               Past Medical History:  Diagnosis Date   Celiac disease    Retinal detachment    Stroke [facility])    Past Surgical History:  Procedure Laterality Date   CATARACT EXTRACTION Bilateral    Dr. Valetta Close   EYE SURGERY     FRACTURE SURGERY     GAS INSERTION Right 05/21/2021   Procedure: INSERTION OF GAS - C3F8;  Surgeon: Bernarda Caffey, MD;  Location: [place];  Service: Ophthalmology;  Laterality: Right;   GAS/FLUID EXCHANGE Right 05/21/2021   Procedure: GAS/FLUID EXCHANGE;  Surgeon: Bernarda Caffey, MD;  Location: [place];  Service: Ophthalmology;  Laterality: Right;   HERNIA REPAIR     IR CT HEAD LTD  07/17/2022   IR PERCUTANEOUS ART THROMBECTOMY/INFUSION INTRACRANIAL INC DIAG ANGIO  07/17/2022   IR US GUIDE VASC ACCESS RIGHT  07/17/2022   PARS PLANA VITRECTOMY Right 05/21/2021   Procedure: PARS PLANA VITRECTOMY WITH 25 GAUGE;  Surgeon: Bernarda Caffey, MD;  Location: [place];  Service: Ophthalmology;  Laterality: Right;   PERFLUORONE INJECTION Right 05/21/2021   Procedure: PERFLUORONE INJECTION;  Surgeon: Bernarda Caffey, MD;  Location: [place];  Service: Ophthalmology;  Laterality: Right;   PHOTOCOAGULATION WITH LASER Right 05/21/2021   Procedure: PHOTOCOAGULATION WITH LASER;   Surgeon: Bernarda Caffey, MD;  Location: [place];  Service: Ophthalmology;  Laterality: Right;   RADIOLOGY WITH ANESTHESIA N/A 07/17/2022   Procedure: IR WITH ANESTHESIA;  Surgeon: Radiologist, Medication, MD;  Location: [place];  Service: Radiology;  Laterality: N/A;   RETINAL DETACHMENT SURGERY     Patient Active Problem List   Diagnosis Date Noted   Paroxysmal atrial fibrillation ([place]) 09/01/2022   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 ([place]) 07/22/2022   Protein-calorie malnutrition, severe 07/19/2022   Stroke (cerebrum) ([place]) 07/17/2022   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   Celiac disease    Alcohol dependence ([place])    Bradycardia     ONSET DATE: 09/20/2022 (MD referral)  REFERRING DIAG:  Diagnosis  I63.81 (ICD-10-CM) - 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 ([place])    THERAPY DIAG:  Unsteadiness on feet  Other abnormalities of gait and mobility  Muscle weakness (generalized)  Rationale for Evaluation and Treatment: Rehabilitation  SUBJECTIVE:  SUBJECTIVE STATEMENT: Nothing new.  Started lifting weights for arms and legs (squats for legs).  Don't have any ankle weights.  Got to see orthopedist and had cortisone shot in low back on R side-they saw degeneration of L5.  Stretches seem to help.  Feel like I'm sleeping a little better. Pt accompanied by: significant other  PERTINENT HISTORY: Mediastinal mass, bradycardia; basal ganglia CVA from MD notes, distant hx of A-fib, celiac disease, retinal detachment, HLD, HTN, sleep apnea.   From hospital notes:   Presented to ED w/ acute onset of left-sided weakness as well as right frontal headache.  Admitted to [facility] on 07/17/2022.  See imaging.   PAIN:   Are you having pain? Yes: NPRS scale: 0/10 Pain location: mid-back Pain  description: stabbing Aggravating factors: lying on back Relieving factors: unsure     Rates pain as 5/10 at night.    PRECAUTIONS: Fall  WEIGHT BEARING RESTRICTIONS: No  FALLS: Has patient fallen in last 6 months? Yes. Number of falls 1  LIVING ENVIRONMENT: Lives with: lives with their spouse Lives in: House/apartment Stairs: Yes: Internal: 14 steps; on right going up and External: 14 steps; on left going up Has following equipment at home: Single point cane  PLOF: Independent with basic ADLs and Independent with gait  Enjoyed hiking 2-3 miles, foraging for mushrooms.  Joined the [facility] and has been swimming twice.  *Since CVA, mostly sits or lies on sofa during the day.  PATIENT GOALS: Pt's goals for therapy are to make pain stop, to get back to outdoor activities.  Wife's goal to pick up feet more.  OBJECTIVE:    TODAY'S TREATMENT: 10/19/2022 Activity Comments  Standing minisquats, holding 10# weights in hands Review from what pt is doing at home using 12.5#; cues for technique  Standing squats with holding 10# weight midline Good form with cues  Forward lunges with and without weights at hands Cues for technique  Wall squats 2 x 10 reps   Forward step ups, review of HEP Good form with light UE support  Monster walk forward with step lifts, 25  ft x 4, then tandem gait>march 25 ft x 4; braiding x 15 ft R and L   Reviewed quarter turns R and L With dedicated review, pt return demo understanding; other times in session, pt has decreased foot clearance with turns      *At end of session, pt reports dizziness/room spinning when he gets down to floor to do exercises*  Access Code: [facility] URL: [email or web address] Date: 10/13/2022 Prepared by: [facility]  Exercises - Seated Hamstring Stretch  - 2 x daily - 7 x weekly - 1 sets - 3 reps - 30 sec hold - Seated Figure 4 Piriformis Stretch  - 1-2 x daily - 7 x weekly - 1 sets - 3  reps - 30 sec hold - Lower Trunk Rotations  - 1-2 x daily - 7 x weekly - 1 sets - 5 reps - 15-30 sec hold - Hooklying Single Knee to Chest Stretch  - 1-2 x daily - 7 x weekly - 1 sets - 3-5 reps - 15-30 hold - Side to side weightshift  - 1-2 x daily - 5 x weekly - 2 sets - 10 reps - Standing Quarter Turn with Counter Support  - 1 x daily - 7 x weekly - 1 sets - 5-10 reps - Forward Step Up  - 1 x daily - 5 x weekly -  2 sets - 10 reps    -------------------------------------------------------------------------------------- (Objective measures below taken at initial evaluation:)  DIAGNOSTIC FINDINGS:  (From rehab discharge note) CT scan showed loss of gray-white differentiation in the right insular cortex lateral aspect of the right lentiform and right temporal tip consistent with acute infarction.  No acute hemorrhage.  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  Status post TNK as well as recanalization per interventional radiology.  MRI follow-up showed acute/subacute nonhemorrhagic infarct of the right caudate head and lentiform nucleus as noted on prior previous CT.   COGNITION: Overall cognitive status:  See speech notes; flat affect at eval   SENSATION: Light touch: WFL   MUSCLE LENGTH:  Passive from 90/90 position in supine Hamstrings: Right -30 deg; Left -40 deg   POSTURE: rounded shoulders, forward head, posterior pelvic tilt, and chooses to sit with posterior pelvic tilt, leaned back against wall.  LOWER EXTREMITY ROM:   Tightness in hamstrings, tenderness to palpation along superior aspect of IT band, especially greater trochanter; tightness at hip flexors in sidelying  LOWER EXTREMITY MMT:    MMT Right Eval Left Eval  Hip flexion 4+ 4  Hip extension    Hip abduction 3+ 4  Hip adduction 4 4  Hip internal rotation    Hip external rotation    Knee flexion 4 3+  Knee extension 4 4  Ankle dorsiflexion 3+ 3+  Ankle plantarflexion     Ankle inversion    Ankle eversion    (Blank rows = not tested)  BED MOBILITY:  Independent, extra time  TRANSFERS: Assistive device utilized: None  Sit to stand: Modified independence Stand to sit: Modified independence  STAIRS: Level of Assistance: SBA Stair Negotiation Technique: Alternating Pattern  with Bilateral Rails Number of Stairs: 2-3  Height of Stairs: 4"-6"  Comments: Decreased foot clearance  GAIT: Gait pattern:  veers to R with gait challenges, step through pattern, decreased ankle dorsiflexion- Right, decreased ankle dorsiflexion- Left, genu recurvatum- Left, wide BOS, poor foot clearance- Right, and poor foot clearance- Left Distance walked: 50 ft x 4 Assistive device utilized: None Level of assistance: SBA Comments: veers to R with balance challenges  FUNCTIONAL TESTS:  10 meter walk test: 11.78 sec (2.78 ft/sec) self-selected; FAST:  8.47 sec = 3.87 ft/sec Functional gait assessment: 14/30 (scores <22/30 indicate increased fall risk); score has decreased from 17/30 at initial eval in hospital    PATIENT SURVEYS:  NA   M-CTSIB  Condition 1: Firm Surface, EO 30 Sec, Normal Sway  Condition 2: Firm Surface, EC 30 Sec, Mild Sway  Condition 3: Foam Surface, EO 30 Sec, Mild Sway  Condition 4: Foam Surface, EC 30 Sec, Mild Sway     GOALS: Goals reviewed with patient? Yes  SHORT TERM GOALS: Target date: 10/08/2022  Pt will be supervision with HEP for improved flexibility, strength, balance, and gait. Baseline: Goal status: MET, 10/13/2022  2.  Pt will report pain in hips at night <5/10, for improved sleep, bed mobility positioning. Baseline: 10/13/22:  rates previous night as 8/10, previous night to that was 0/10  Goal status: GOAL PARTIALLY MET, 10/13/22   LONG TERM GOALS: Target date: 11/05/2022  Pt will be independent with HEP for improved balance, flexibility, strength, gait. Baseline:  Goal status: IN PROGRESS  2.  Pt will improve FGA score to  at least 20/30 to decrease fall risk. Baseline: 14/30 Goal status: IN PROGRESS  3.  Pt will improve gait velocity  to at least 3 ft/sec self-selected speed. Baseline: 2.78 ft/sec Goal status: IN PROGRESS  4.  Pt will ambulate at least 1000 ft, indoor and outdoor surfaces, modified independently for return to outdoor gait hiking. Baseline:  Goal status: IN PROGRESS    ASSESSMENT:  CLINICAL IMPRESSION: Pt and wife feel pt is doing better and that he is overall increaseing his activity level and his motivation for activity.  PT does report adding in strengthening exercises (with weights at home); educated in safe participation of strengthening exercises at home.  *Of note, at end of session, he reports dizziness (room spinning) with getting onto floor at home and will likely need to further assess for BPPV.    OBJECTIVE IMPAIRMENTS: Abnormal gait, decreased balance, decreased knowledge of use of DME, decreased mobility, difficulty walking, decreased ROM, decreased strength, impaired flexibility, and pain.   ACTIVITY LIMITATIONS: standing, sleeping, transfers, bed mobility, and locomotion level  PARTICIPATION LIMITATIONS: community activity and fishing, hiking  PERSONAL FACTORS: 3+ comorbidities: see above  are also affecting patient's functional outcome.   REHAB POTENTIAL: Good  CLINICAL DECISION MAKING: Evolving/moderate complexity  EVALUATION COMPLEXITY: Moderate  PLAN:  PT FREQUENCY: 2x/week  PT DURATION: 6 weeks (including eval week)  PLANNED INTERVENTIONS: Therapeutic exercises, Therapeutic activity, Neuromuscular re-education, Balance training, Gait training, Patient/Family education, Self Care, Joint mobilization, Stair training, DME instructions, Cryotherapy, and Manual therapy  PLAN FOR NEXT SESSION: Check for BPPV (as pt reports he is dizzy sometimes upon lying down-if treated, will need to add to POC).  Work on hip strengthening-sidestepping/resisted, monster walk,  turns, braiding, step ups.   Balance and gait training.  Mady Haagensen, PT 10/20/22 8:01 AM Phone: [phone] Fax: [phone]   [facility], [place], [place] [address] Phone # [phone] Fax # [phone]

## 2022-10-20 ENCOUNTER — Encounter: Payer: Medicare HMO | Admitting: Occupational Therapy

## 2022-10-21 ENCOUNTER — Ambulatory Visit: Payer: Medicare HMO

## 2022-10-21 ENCOUNTER — Encounter: Payer: Medicare HMO | Admitting: Occupational Therapy

## 2022-10-21 DIAGNOSIS — R1312 Dysphagia, oropharyngeal phase: Secondary | ICD-10-CM

## 2022-10-21 DIAGNOSIS — R293 Abnormal posture: Secondary | ICD-10-CM

## 2022-10-21 DIAGNOSIS — R2681 Unsteadiness on feet: Secondary | ICD-10-CM

## 2022-10-21 DIAGNOSIS — R41840 Attention and concentration deficit: Secondary | ICD-10-CM | POA: Diagnosis not present

## 2022-10-21 DIAGNOSIS — R41841 Cognitive communication deficit: Secondary | ICD-10-CM

## 2022-10-21 DIAGNOSIS — R2689 Other abnormalities of gait and mobility: Secondary | ICD-10-CM

## 2022-10-21 DIAGNOSIS — M6281 Muscle weakness (generalized): Secondary | ICD-10-CM

## 2022-10-21 NOTE — Therapy (Signed)
OUTPATIENT PHYSICAL THERAPY NEURO TREATMENT NOTE   Patient Name: Douglas Cox MRN: [id number] DOB:1950-10-10, 72 y.o., male Today's Date: 10/20/2022   PCP: Berneta Levins, DO REFERRING PROVIDER: Meredith Staggers, MD   END OF SESSION:  PT End of Session - 10/19/22 1241     Visit Number 6    Number of Visits 12    Date for PT Re-Evaluation 11/05/22    Authorization Type [employer] Medicare    Progress Note Due on Visit 10    PT Start Time 1242   pt arrives late   PT Stop Time 1314    PT Time Calculation (min) 32 min    Equipment Utilized During Treatment Gait belt    Activity Tolerance Patient tolerated treatment well    Behavior During Therapy Flat affect;WFL for tasks assessed/performed               Past Medical History:  Diagnosis Date   Celiac disease    Retinal detachment    Stroke [facility])    Past Surgical History:  Procedure Laterality Date   CATARACT EXTRACTION Bilateral    Dr. Valetta Close   EYE SURGERY     FRACTURE SURGERY     GAS INSERTION Right 05/21/2021   Procedure: INSERTION OF GAS - C3F8;  Surgeon: Bernarda Caffey, MD;  Location: [place];  Service: Ophthalmology;  Laterality: Right;   GAS/FLUID EXCHANGE Right 05/21/2021   Procedure: GAS/FLUID EXCHANGE;  Surgeon: Bernarda Caffey, MD;  Location: [place];  Service: Ophthalmology;  Laterality: Right;   HERNIA REPAIR     IR CT HEAD LTD  07/17/2022   IR PERCUTANEOUS ART THROMBECTOMY/INFUSION INTRACRANIAL INC DIAG ANGIO  07/17/2022   IR US GUIDE VASC ACCESS RIGHT  07/17/2022   PARS PLANA VITRECTOMY Right 05/21/2021   Procedure: PARS PLANA VITRECTOMY WITH 25 GAUGE;  Surgeon: Bernarda Caffey, MD;  Location: [place];  Service: Ophthalmology;  Laterality: Right;   PERFLUORONE INJECTION Right 05/21/2021   Procedure: PERFLUORONE INJECTION;  Surgeon: Bernarda Caffey, MD;  Location: [place];  Service: Ophthalmology;  Laterality: Right;   PHOTOCOAGULATION WITH LASER Right 05/21/2021   Procedure: PHOTOCOAGULATION WITH LASER;   Surgeon: Bernarda Caffey, MD;  Location: [place];  Service: Ophthalmology;  Laterality: Right;   RADIOLOGY WITH ANESTHESIA N/A 07/17/2022   Procedure: IR WITH ANESTHESIA;  Surgeon: Radiologist, Medication, MD;  Location: [place];  Service: Radiology;  Laterality: N/A;   RETINAL DETACHMENT SURGERY     Patient Active Problem List   Diagnosis Date Noted   Paroxysmal atrial fibrillation ([place]) 09/01/2022   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 ([place]) 07/22/2022   Protein-calorie malnutrition, severe 07/19/2022   Stroke (cerebrum) ([place]) 07/17/2022   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   Celiac disease    Alcohol dependence ([place])    Bradycardia     ONSET DATE: 09/20/2022 (MD referral)  REFERRING DIAG:  Diagnosis  I63.81 (ICD-10-CM) - 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 ([place])    THERAPY DIAG:  Unsteadiness on feet  Other abnormalities of gait and mobility  Muscle weakness (generalized)  Rationale for Evaluation and Treatment: Rehabilitation  SUBJECTIVE:  SUBJECTIVE STATEMENT: Back is feeling ok, the hips are hurting. Noticed that I've lost a lot of weight. Went for a hike in the woods today on [address]. Pt accompanied by: significant other  PERTINENT HISTORY: Mediastinal mass, bradycardia; basal ganglia CVA from MD notes, distant hx of A-fib, celiac disease, retinal detachment, HLD, HTN, sleep apnea.   From hospital notes:   Presented to ED w/ acute onset of left-sided weakness as well as right frontal headache.  Admitted to [facility] on 07/17/2022.  See imaging.   PAIN:   Are you having pain? Yes: NPRS scale: 0/10 Pain location: mid-back Pain description: stabbing Aggravating factors: lying on back Relieving factors: unsure     Rates pain as 5/10 at night.     PRECAUTIONS: Fall  WEIGHT BEARING RESTRICTIONS: No  FALLS: Has patient fallen in last 6 months? Yes. Number of falls 1  LIVING ENVIRONMENT: Lives with: lives with their spouse Lives in: House/apartment Stairs: Yes: Internal: 14 steps; on right going up and External: 14 steps; on left going up Has following equipment at home: Single point cane  PLOF: Independent with basic ADLs and Independent with gait  Enjoyed hiking 2-3 miles, foraging for mushrooms.  Joined the [facility] and has been swimming twice.  *Since CVA, mostly sits or lies on sofa during the day.  PATIENT GOALS: Pt's goals for therapy are to make pain stop, to get back to outdoor activities.  Wife's goal to pick up feet more.  OBJECTIVE:    TODAY'S TREATMENT: 10/21/22 Activity Comments  Right Dix-Hallpike Upbeating, rotary nystagmus x 15 sec  Left DH No symptoms  Right Epley maneuver   R Dix-Hallpike No nystagmus, no dizziness  Resisted walking 2x2 min 20# for eccentric control and elicit hip strategy  Forward/lateral step-ups 1x10 8" box w/ contralateral 15# KB hold for single limb strength  Rocker board x 2 min -lateral Ant-post    TODAY'S TREATMENT: 10/19/2022 Activity Comments  Standing minisquats, holding 10# weights in hands Review from what pt is doing at home using 12.5#; cues for technique  Standing squats with holding 10# weight midline Good form with cues  Forward lunges with and without weights at hands Cues for technique  Wall squats 2 x 10 reps   Forward step ups, review of HEP Good form with light UE support  Monster walk forward with step lifts, 25  ft x 4, then tandem gait>march 25 ft x 4; braiding x 15 ft R and L   Reviewed quarter turns R and L With dedicated review, pt return demo understanding; other times in session, pt has decreased foot clearance with turns      *At end of session, pt reports dizziness/room spinning when he gets down to floor to do exercises*  Access Code: [facility] URL:  [email or web address] Date: 10/13/2022 Prepared by: [facility]  Exercises - Seated Hamstring Stretch  - 2 x daily - 7 x weekly - 1 sets - 3 reps - 30 sec hold - Seated Figure 4 Piriformis Stretch  - 1-2 x daily - 7 x weekly - 1 sets - 3 reps - 30 sec hold - Lower Trunk Rotations  - 1-2 x daily - 7 x weekly - 1 sets - 5 reps - 15-30 sec hold - Hooklying Single Knee to Chest Stretch  - 1-2 x daily - 7 x weekly - 1 sets - 3-5 reps - 15-30 hold - Side to side weightshift  - 1-2  x daily - 5 x weekly - 2 sets - 10 reps - Standing Quarter Turn with Counter Support  - 1 x daily - 7 x weekly - 1 sets - 5-10 reps - Forward Step Up  - 1 x daily - 5 x weekly - 2 sets - 10 reps    -------------------------------------------------------------------------------------- (Objective measures below taken at initial evaluation:)  DIAGNOSTIC FINDINGS:  (From rehab discharge note) CT scan showed loss of gray-white differentiation in the right insular cortex lateral aspect of the right lentiform and right temporal tip consistent with acute infarction.  No acute hemorrhage.  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  Status post TNK as well as recanalization per interventional radiology.  MRI follow-up showed acute/subacute nonhemorrhagic infarct of the right caudate head and lentiform nucleus as noted on prior previous CT.   COGNITION: Overall cognitive status:  See speech notes; flat affect at eval   SENSATION: Light touch: WFL   MUSCLE LENGTH:  Passive from 90/90 position in supine Hamstrings: Right -30 deg; Left -40 deg   POSTURE: rounded shoulders, forward head, posterior pelvic tilt, and chooses to sit with posterior pelvic tilt, leaned back against wall.  LOWER EXTREMITY ROM:   Tightness in hamstrings, tenderness to palpation along superior aspect of IT band, especially greater trochanter; tightness at hip flexors in  sidelying  LOWER EXTREMITY MMT:    MMT Right Eval Left Eval  Hip flexion 4+ 4  Hip extension    Hip abduction 3+ 4  Hip adduction 4 4  Hip internal rotation    Hip external rotation    Knee flexion 4 3+  Knee extension 4 4  Ankle dorsiflexion 3+ 3+  Ankle plantarflexion    Ankle inversion    Ankle eversion    (Blank rows = not tested)  BED MOBILITY:  Independent, extra time  TRANSFERS: Assistive device utilized: None  Sit to stand: Modified independence Stand to sit: Modified independence  STAIRS: Level of Assistance: SBA Stair Negotiation Technique: Alternating Pattern  with Bilateral Rails Number of Stairs: 2-3  Height of Stairs: 4"-6"  Comments: Decreased foot clearance  GAIT: Gait pattern:  veers to R with gait challenges, step through pattern, decreased ankle dorsiflexion- Right, decreased ankle dorsiflexion- Left, genu recurvatum- Left, wide BOS, poor foot clearance- Right, and poor foot clearance- Left Distance walked: 50 ft x 4 Assistive device utilized: None Level of assistance: SBA Comments: veers to R with balance challenges  FUNCTIONAL TESTS:  10 meter walk test: 11.78 sec (2.78 ft/sec) self-selected; FAST:  8.47 sec = 3.87 ft/sec Functional gait assessment: 14/30 (scores <22/30 indicate increased fall risk); score has decreased from 17/30 at initial eval in hospital    PATIENT SURVEYS:  NA   M-CTSIB  Condition 1: Firm Surface, EO 30 Sec, Normal Sway  Condition 2: Firm Surface, EC 30 Sec, Mild Sway  Condition 3: Foam Surface, EO 30 Sec, Mild Sway  Condition 4: Foam Surface, EC 30 Sec, Mild Sway     GOALS: Goals reviewed with patient? Yes  SHORT TERM GOALS: Target date: 10/08/2022  Pt will be supervision with HEP for improved flexibility, strength, balance, and gait. Baseline: Goal status: MET, 10/13/2022  2.  Pt will report pain in hips at night <5/10, for improved sleep, bed mobility positioning. Baseline: 10/13/22:  rates previous night  as 8/10, previous night to that was 0/10  Goal status: GOAL PARTIALLY MET, 10/13/22   LONG TERM GOALS: Target date: 11/05/2022  Pt will be independent with HEP for improved balance, flexibility, strength, gait. Baseline:  Goal status: IN PROGRESS  2.  Pt will improve FGA score to at least 20/30 to decrease fall risk. Baseline: 14/30 Goal status: IN PROGRESS  3.  Pt will improve gait velocity to at least 3 ft/sec self-selected speed. Baseline: 2.78 ft/sec Goal status: IN PROGRESS  4.  Pt will ambulate at least 1000 ft, indoor and outdoor surfaces, modified independently for return to outdoor gait hiking. Baseline:  Goal status: IN PROGRESS    ASSESSMENT:  CLINICAL IMPRESSION: Treatment initiated with positional vertigo maneuvers and demonstrates right upbeating nystagmus x 15 sec in right Dix-Hallpike position w/ report of spinning/vertigo. Pt tolerated Epley maneuver repositioning and recheck of Dix-Hallpike reveals absent nystagmus and symptoms.  Continued with activities to promote single leg strength/stability to promote greater control. Overall good static control on unstable surfaces and and pt reports going on singletrack trail hike earlier in the day.  Pt reports unexplained weight loss over the past few months/years(?) and bilateral hip discomfort that is more prominent at night. Recommended that he f/u with MD/PCP regarding these issues.    OBJECTIVE IMPAIRMENTS: Abnormal gait, decreased balance, decreased knowledge of use of DME, decreased mobility, difficulty walking, decreased ROM, decreased strength, impaired flexibility, and pain.   ACTIVITY LIMITATIONS: standing, sleeping, transfers, bed mobility, and locomotion level  PARTICIPATION LIMITATIONS: community activity and fishing, hiking  PERSONAL FACTORS: 3+ comorbidities: see above  are also affecting patient's functional outcome.   REHAB POTENTIAL: Good  CLINICAL DECISION MAKING: Evolving/moderate  complexity  EVALUATION COMPLEXITY: Moderate  PLAN:  PT FREQUENCY: 2x/week  PT DURATION: 6 weeks (including eval week)  PLANNED INTERVENTIONS: Therapeutic exercises, Therapeutic activity, Neuromuscular re-education, Balance training, Gait training, Patient/Family education, Self Care, Joint mobilization, Stair training, DME instructions, Cryotherapy, and Manual therapy  PLAN FOR NEXT SESSION: Re-Check for BPPV. Work on hip strengthening-sidestepping/resisted, monster walk, turns, braiding, step ups.   Balance and gait training.   4:32 PM, 10/21/22 M. Sherlyn Lees, PT, DPT Physical Therapist- [place] Office Number: [phone]

## 2022-10-22 ENCOUNTER — Ambulatory Visit (HOSPITAL_BASED_OUTPATIENT_CLINIC_OR_DEPARTMENT_OTHER): Payer: Medicare HMO | Attending: Geriatric Medicine | Admitting: Internal Medicine

## 2022-10-22 DIAGNOSIS — R0681 Apnea, not elsewhere classified: Secondary | ICD-10-CM | POA: Insufficient documentation

## 2022-10-22 DIAGNOSIS — R0683 Snoring: Secondary | ICD-10-CM | POA: Diagnosis not present

## 2022-10-25 ENCOUNTER — Other Ambulatory Visit (HOSPITAL_COMMUNITY): Payer: Self-pay

## 2022-10-25 ENCOUNTER — Ambulatory Visit (INDEPENDENT_AMBULATORY_CARE_PROVIDER_SITE_OTHER): Payer: Medicare HMO | Admitting: Ophthalmology

## 2022-10-25 ENCOUNTER — Other Ambulatory Visit (HOSPITAL_COMMUNITY): Payer: Self-pay | Admitting: Radiology

## 2022-10-25 ENCOUNTER — Encounter (INDEPENDENT_AMBULATORY_CARE_PROVIDER_SITE_OTHER): Payer: Self-pay | Admitting: Ophthalmology

## 2022-10-25 ENCOUNTER — Ambulatory Visit (HOSPITAL_COMMUNITY)
Admission: RE | Admit: 2022-10-25 | Discharge: 2022-10-25 | Disposition: A | Discharge status: Home / Self Care | Payer: Medicare HMO | Source: Ambulatory Visit | Attending: Student | Admitting: Student

## 2022-10-25 DIAGNOSIS — H3321 Serous retinal detachment, right eye: Secondary | ICD-10-CM | POA: Diagnosis not present

## 2022-10-25 DIAGNOSIS — H04123 Dry eye syndrome of bilateral lacrimal glands: Secondary | ICD-10-CM | POA: Diagnosis not present

## 2022-10-25 DIAGNOSIS — H43812 Vitreous degeneration, left eye: Secondary | ICD-10-CM

## 2022-10-25 DIAGNOSIS — I639 Cerebral infarction, unspecified: Secondary | ICD-10-CM

## 2022-10-25 DIAGNOSIS — Z961 Presence of intraocular lens: Secondary | ICD-10-CM | POA: Diagnosis not present

## 2022-10-25 NOTE — Therapy (Signed)
OUTPATIENT SPEECH LANGUAGE PATHOLOGY TREATMENT   Patient Name: Douglas Cox MRN: [id number] DOB:[birth year], 72 y.o., male Today's Date: 10/25/2022  PCP: None Recorded REFERRING PROVIDER: Alysia Penna, MD  END OF SESSION:          Past Medical History:  Diagnosis Date   Celiac disease    Retinal detachment    Stroke [facility])    Past Surgical History:  Procedure Laterality Date   CATARACT EXTRACTION Bilateral    Dr. Valetta Close   EYE SURGERY     FRACTURE SURGERY     GAS INSERTION Right 05/21/2021   Procedure: INSERTION OF GAS - C3F8;  Surgeon: Bernarda Caffey, MD;  Location: [place];  Service: Ophthalmology;  Laterality: Right;   GAS/FLUID EXCHANGE Right 05/21/2021   Procedure: GAS/FLUID EXCHANGE;  Surgeon: Bernarda Caffey, MD;  Location: [place];  Service: Ophthalmology;  Laterality: Right;   HERNIA REPAIR     IR CT HEAD LTD  07/17/2022   IR PERCUTANEOUS ART THROMBECTOMY/INFUSION INTRACRANIAL INC DIAG ANGIO  07/17/2022   IR US GUIDE VASC ACCESS RIGHT  07/17/2022   PARS PLANA VITRECTOMY Right 05/21/2021   Procedure: PARS PLANA VITRECTOMY WITH 25 GAUGE;  Surgeon: Bernarda Caffey, MD;  Location: [place];  Service: Ophthalmology;  Laterality: Right;   PERFLUORONE INJECTION Right 05/21/2021   Procedure: PERFLUORONE INJECTION;  Surgeon: Bernarda Caffey, MD;  Location: [place];  Service: Ophthalmology;  Laterality: Right;   PHOTOCOAGULATION WITH LASER Right 05/21/2021   Procedure: PHOTOCOAGULATION WITH LASER;  Surgeon: Bernarda Caffey, MD;  Location: [place];  Service: Ophthalmology;  Laterality: Right;   RADIOLOGY WITH ANESTHESIA N/A 07/17/2022   Procedure: IR WITH ANESTHESIA;  Surgeon: Radiologist, Medication, MD;  Location: [place];  Service: Radiology;  Laterality: N/A;   RETINAL DETACHMENT SURGERY     Patient Active Problem List   Diagnosis Date Noted   Paroxysmal atrial fibrillation ([place]) 09/01/2022   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 ([place]) 07/22/2022    Protein-calorie malnutrition, severe 07/19/2022   Stroke (cerebrum) ([place]) 07/17/2022   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   Celiac disease    Alcohol dependence ([place])    Bradycardia      ONSET DATE: 07/17/22   REFERRING DIAG: I63.81 (ICD-10-CM) - 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  Rationale for Evaluation and Treatment: Rehabilitation  SUBJECTIVE:   SUBJECTIVE STATEMENT: "Letter? I only know one letter." [name])  Pt accompanied by: significant other [name] (wife)  PERTINENT HISTORY: Akbar presented to the ED with acute onset of left hemiplegia, right gaze deviation and left hemineglect while working in his garage.  TNK given and recanalization acheived with thrombectomy.  Later at 1800 noted right sided weakness and new CT Head and CTA head and neck reordered and negative for reocclusion. MRI today shows bilateral basal ganglia infarcts. ASA 81mg  started 12/17  Pt with ETT for thrombectomy and overnight.  Extubated 12/17    PAIN: Are you having pain? Yes; back & hips (3/10)   PATIENT GOALS: Pt did not answer question when asked, due to attention/processing deficits  OBJECTIVE:   DIAGNOSTIC FINDINGS:  FEES 07/30/22 Pt demonstrates a moderate oropharyngeal dysphagia that is impacted by patient's current cognitive deficits. Patient's oral phase is characterized by poor awareness of bolus resulting in inconsistent timing of swallowing trigger. With solids, patient needed verbal cues (snapping and counting) to attend and initiate a swallow with all boluses contained in the velleculae. With liquids, patient consistently triggered his  swallow at the pyriform sinuses. This resulted in sensed aspiration of thin liquids via cup. Patient also with inconsistent amounts of pharyngeal residue that cleared with a 2nd subsequent swallow. No penetration or aspiration noted with nectar-thick liquids except for 1 episode of flash  penetration. Provided thorough education to the patient's wife regarding patient's current swallowing function and that he is currently at a higher risk of aspiration due to cognitive deficits. She verbalized understanding and agreement with recommendations of continuing Dys. 3 textures but upgrading to nectar-thick liquids via cup with strict aspiration precautions and supervision. This will hopefully improve patient's intake of liquids with improved ability to stay hydrated without IV fluids prior to potential discharge on 08/03/22. SLP Diet Recommendations Dysphagia 3 (Mech soft) solids;Nectar thick liquid Liquid Administration via Cup;Spoon;No straw Medication Administration Crushed with puree Compensations: Slow rate;Small sips/bites;Minimize environmental distractions;Multiple dry swallows after each bite/sip  Today, pt ate dys III and drank nectar liqiuds. See "clinicial impressions" for results.  STANDARDIZED ASSESSMENTS: CLQT: may need to be completed in first 1-3 sessions  PATIENT REPORTED OUTCOME MEASURES (PROM): Cognitive function: Short Form: to be administered in first 1-3 sessions   TODAY'S TREATMENT:                                                                                                                                          10/21/22: Flat affect. SLP worked with pt to read a first-person novel with more inflection due to pt having table read with an hour long play Saturday. SLP req'd to provide pt with min-mod cues occasionally for incr'd success. Pt with decr'd accuracy reading - partly due to reading for the first time but also due to decr'd attention and awareness. SLP strongly suggested pt practice reading through the play x2-3 prior to Saturday and making notes about emotion and inflection. Pt stated he has read it through once.   10/19/22: Flat affect. SLP worked with pt on stronger voice by recording and having him listen back to recording. Pt expressed no spontaneous  verbal or visual feedback on whether he thought voice was loud enough or too soft unless SLP asked him. Pt thought was adequate however 80% of productions were sub-WNL volume. Pt read paragraphs, and produced spontaneous sentence responses today. Pt to practice with louder more intentional voice with his play he is doing table read for later this month.   10/13/22: CLQT to occur next session. Given pt's interest in [place] strong voice SLP gave pt the choice of com-cog evaluation or voice exercises, and pt chose voice so SLP provided/educated wife and pt on PhoRTE exercises. Pt with WNL vocal quality with PhoRTE but without incr of volume >mid-upper 60s dB. He req'd usual cues for higher pitch production and lower pitch production on numbers, and req'd occasional cues for higher and lower pitches with sentences. At end, SLP asked pt if he was not  talking louder due to his perception is that he is already talking at WNL volume, or that it is difficult for him to obtain WNL speech volume - pt stated perception so SLP used voice recorder to demo to pt that his voice at "projection/theater" level was equivalent to SLP volume. SLP encouraged pt to use PhoRTE daily and use "projection" voice for WNL loudness. SLP to cont to monitor this.    10/06/22: Reported increased fatigue today due to poor sleep. SLP and wife provided usual max prompting to engage patient in conversation. Intermittent mod cues provided to aid recall and word finding in discussion of personally relevant information. Pt reportedly very distracted by phone and television at home. Pt noted to check phone/watch x5 this session but easily re-directed back to task without prompting. Wife reported pt experiencing difficulty with low pitch while singing. SLP educated and instructed pitch glides exercises (low to high; high to low), in which pt able to demonstrate adequate range with mod I. Targeted varying pitch while reading sentences, with usual mod A  to generate different pitch (seemingly related to cognitive component versus voice). Frequent cues required to optimize volume to be intelligible today, even for single word responses. Recommended using old plays/scripts to target volume, projection, and inflection as pt was previously an avid Equities trader.   2/29/23: Pt needs CLQT and PROM. Pt with flat affect today, with seemingly dulled awareness -much moreso than last 3-4 sessions. Pt taking Tramadol to sleep in the past three nights due to pain. Today SLP targeted pt's selective attention and awareness with functional task - setting up [place]. Pt req'd cues for attention to SLP or wife's voice when asking him about setup, consistently, requiring x2 requests to generate a verbal response, which was unintelligible 80% of the time today, requiring a third request from listener. Overall, pt was largely disengaged from verbal stimulation during ST session and was hyper-focused on the watch settings.   09/22/22: Pt demonstrated min-mod deficits with divided attention between singing song lyrics, strumming, singing, and playing chords. Chords suffered if pt sang more distinct lyrics and chords suffered if pt focused more on singing and/or strumming. Pt used alternating attention when SLP asked him questions; occasionally pt did not answer SLP, likely due to decr'd attention.  Pt's voice exhibited greater amount of WNL voice in higher pitches than lower pitches. SLP strongly urged pt to practice guitar at least 20 minutes a day and explained rationale.   09/20/22:  Pt needs CLQT and PROM. Pt upset about not finishing homework. Shanon Brow entered today after OT Flat affect. Attention targeted today in conversation. Pt held topic with WNL topic maintenance for 12 minutes, then 11 minutes. Pt is tracking his own meds correctly at this time. Does not have a pill organizer, just uses bottles. Pt cooking with wife at home - smoke alarm went off due to pt not turning  stove down from searing fish. Wife stated smoke alarm is "sensitive", however SLP questions if pt should be cooking only with wife present in kitchen.    No coughing with meals/liquids. Taking meds whole with liquids now.   09/15/22: SLP targeted attention and verbal expression today in a functional task. Pt was able to sustain attention for 10 minutes in two conversational tasks. 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 Pt recalled he won bet from Monday and SLP owes him 25 cents.  09/13/22: Pt  needs cognitive PROM, CLQT. Consider [place] Naming Test. Pt and wife stated pt coughed with corn chips last night so SLP assessed pt with peanut butter crackers today without any overt s/sx aspiration or any oral stage difficulty, nor any other pharyngeal stage deficits. Pt deemed/appears safe with regular food items. Wife stated pt ate with bean dip but complained of pharyngeal stasis after coughing. SLP postulates if this was an anomalous incident as pt has not reported ANY coughing (nor wife endorsing any coughing) with POs.  Today SLP measured pt's loud /a/ with "hey" and with loud /a/ - variable loudness responses (never >73dB) between 62-73 dB. Loud /a/ averaged in upper 60s dB with usual mod A for loudness. Pt tearful about his singing voice; initially pt's voice very tremulous and this subsided over time today. Pt told to practice 10 loud /a/ BID.  Pt recalled 3/7 details about this weekend's events. Wife with excellent cues for pt's memory. Today, pt initiated sentences with 3-4 words and then he paused for 5-8 seconds of silence prior to completing sentence; 20% of the time pt abandoned sentence/utterance.  09/08/22: Pt was doing bills this morning and called about a incr in their mortgage amount, independently. SLP asked pt to route [facility] and pt able to do so (and noted SLP error) without 2/4 road names  (aware of one error). Pt recalled 3/5 items from today's schedule prior to ST. He did not think or attempt to write down 3 medication names. He "bet" SLP he would remember to bring his vitamin D next session. Held attention for 8 minutes prior to observation of loss of attention. At times pt stops verbal expression as if he might be experiencing anomia.  SLP to consider administering [place] (BNT-2).   09/06/22: Pt was able to hold 7-8 minute conversation with SLP today in therapy, x3. Speech volume not as loud as Friday 09/03/22, and pt's processing appeared slower today in general than on Friday 09/03/22. Pt had busy weekend and made schedule for the day with [facility] assistance. 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 Pt to cont to follow schedule to encourage brain stimulating tasks at home.  CONSIDER ADMINISTRATION OF CLQT for obtaining a baseline of pt's cognitive linguistics.  09/03/22: Pt recalled 3 iof his questions and the answers from Dr. Naaman Plummer appointment spontaneously. SLP and wife provided the other two with min-mod A and total A. Pt recalled one answer from these two spontaneously. Pt held attention for 10 minutes in conversation about this appointment and about exercise.   08/31/22: Pt and wife deny cont'd overt s/sx aspiration with meals. Pt still does not take meds with water due to "They are bitter." [name] said pt has Dr. Naaman Plummer tomorrow so SLP had pt write down questions he has for Dr. Naaman Plummer. He thought of 2 spontaneously (5 with mod-max cues), and req'd consistent extra time to think of questions, mod A consistently for legible writing (decr'd emergent awareness), mod A usually for writing question in a format he would recall tomorrow (decr'd anticipatory awareness). [name] remarked that often she is having to encourage pt to get up from the chair to do things during the day.  SLP strongly suggested a schedule and provided example of this, and also reiterated that pt will have decr'd ability to initiate tasks at this time and  may require wife's cues to do daily tasks. SLP provided example of SLP calling pt's name in waiting room and him not arising until wife arises. Pt showing some incr'd awareness asking SLP if he will be able to return to things he was doing pre-CVA. 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  1/25:24: Wife asked if pt was safe for thin liquids. SLP told pt/wife that if pt took single sips and was not exhibiting coughing he could have thin liquids. SLP to assess next session. In a quiet environment, pt completed written math task for 9 minutes prior to requiring 5 minute break, evidenced by difficulty with/inability in adding simple figures. Pt impulsive about starting back to task again and req'd SLP cueing for longer break. After 6 minutes, pt said, "Get back to this again," and re-focused on written math task- processing speed improved for 3 more entries until pt had to leave. SLP educated/highlighted to pt and wife the importance of taking breaks when he feels "stuck".   08/24/22: speech/cognition: Pt demonstrated sustained attention for 7 minutes today for therapy task of writing down reasons for each med. He demonstrated attention adequate to recall 3/5 reasons 2 minutes later, when given extra time, and required mod A with other two. He continues to perform attention lengthening tasks at home. Wife has downloaded [employer] to her phone and has directed pt to complete. Pt rec'd cont'd rationale today for his meds - he "does not like taking pills." Pt inquired reason for CVA today in session and was told by wife and SLP. Today, pt's reasoning questionable with rationale to not take med/s with pureed but pt indicated he did not enjoy the smoothies wife was making for him with meds in them.   Swallowing: SLP discussed ways to assist pt in taking meds as they are currently being crushed and put into a smoothie for pt; "Do I have to have the smoothies" pt inquired. SLP suggested meds with applesauce, and yogurt, one pureed item at a time, and pt declined each time. SLP, pt, and wife eventually agreed pt may attempt one med with pureed item until next session.   PATIENT EDUCATION: Education details: see "today's treatment" Person educated: Patient and Spouse Education method: Explanation Education comprehension: verbalized understanding and needs further education   GOALS: Goals reviewed with patient? No  SHORT TERM GOALS: Target date: 2/9/2, 09/27/22 (revised 2/12/224)  Pt will follow aspiration precautions from FEES with occasional min A in 2 sessions Baseline: Goal status: deferred - pt not coughing with POs  2.  R[place] will demo sustained attention to participate in 10 minute therapy task x3/session, in 3 sessions Baseline: 09/03/22, 09-20-22, 09/22/22 Goal status: met  3.  Pt will demo understanding of written cues in simple therapy tasks, with initial cue allowed, in 3 sessions Baseline: 08-19-22, 09-03-22 Goal status: Met  4.  Pt will demo selective attention for 10 minutes to a simple therapy task in a min-mod noisy environment Baseline:  Goal status: Not met  5.  Pt/wife will tell SLP 3 overt s/sx aspiration PNA with modified independence Baseline:  Goal status: Deferred - pt  no longer couhging with POs  6   Using compensations, pt will ask wife to give him his medications, at least once/day between 3 sessions Baseline:  Goal status: Met   LONG TERM GOALS: Target date: 11/09/22  Pt will follow aspiration precautions from FEES with rare min  A in 3 sessions Baseline:  Goal status: Deferred   2.  Pt will follow aspiration precautions from FEES with modified independence in 2 sessions Baseline:  Goal status: deferred  3.  Pt will demo selective attention for 15  minutes to a simple therapy task in a min-mod noisy environment Baseline:  Goal status: Modified  4.  Pt will demo emergent awareness in simple therapy tasks with verbal cue for error awareness in 3 sessions Baseline:  Goal status: Ongoing  5.  Pt will demo alternating attention for 15 minutes to complete simple-mod complex therapy tasks 100% accuracy given min cues, in 3 sessions Baseline: 10/19/22 (tax info) Goal status: Modified  6.  Using compensations, pt will recall to administer his own medications correctly, at least once/day between 3 sessions Baseline:  Goal status: Ongoing  7.  Wife's PROM (due to pt's attention deficit) score for pt will improve in last 1-2 sessions than the score taken in the first 1-2 sessions Baseline:  Goal status: Ongoing  ASSESSMENT:  CLINICAL IMPRESSION: Patient is a 72 y.o. male who was seen today for treatment of simple cognition (primarily attention) in light of CVA 07-17-22. Pt swallowing is WNL, is now tracking his own meds correctly. SLP educated pt about PhoRTE today and encouraged completion daily, however pt's speech volume disorder may be more associated with perception of WNL voice rather than physical deficit with vocal folds or breath support.  OBJECTIVE IMPAIRMENTS: include attention, memory, awareness, executive functioning, receptive language, and dysphagia. These impairments are limiting patient from return to work, managing medications, managing appointments, managing finances, household responsibilities, ADLs/IADLs, effectively communicating at home and in community, and safety when swallowing. Factors affecting potential to achieve goals and functional outcome are ability to learn/carryover information, cooperation/participation level, and severity of impairments.. Patient will benefit from skilled SLP services to address above impairments and improve overall function.  REHAB POTENTIAL: Good  PLAN:  SLP FREQUENCY: 2x/week  SLP  DURATION: 12 weeks  PLANNED INTERVENTIONS: Aspiration precaution training, Pharyngeal strengthening exercises, Diet toleration management , Environmental controls, Trials of upgraded texture/liquids, Cueing hierachy, Cognitive reorganization, Internal/external aids, Multimodal communication approach, SLP instruction and feedback, Compensatory strategies, and Patient/family education    [facility], [place] 10/25/2022, 2:41 PM

## 2022-10-25 NOTE — Progress Notes (Signed)
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 Karenann Cai on 07/17/22, Perclose prostyle was used for R CFA closing.  Patient was discharged from inpatient rehab on 08/03/22.   Patient has been contacting IR due to intermittent R groin pain around the puncture site, patient seen in [place] today.   Patient laying in bed, spouse at bedside.  Reports that pain only occurs when site is touched by the patient, pain does not interfere with waling or other daily activities.   The RCF site is clean and dry, small indentation at site.  There is small, round firm spot at 4 O'clock where is tender to touch.  No sign of infection or abscess. POCU US showed intact R CFA, no sq abscess.   Patient also evaluated by Dr. Estanislado Pandy, it appears that the patient could be coming from scar tissue around the closing device.   Patient was encouraged to keep the site clean and dry, and contact radiology if patient worsens and/or start to interfering with his daily activities.  Patient verbalized understanding.    Armando Gang Saima Monterroso PA-C 10/25/2022 3:02 PM

## 2022-10-25 NOTE — Therapy (Signed)
OUTPATIENT PHYSICAL THERAPY NEURO TREATMENT NOTE   Patient Name: Douglas Cox MRN: [id number] DOB:05-28-1951, 72 y.o., male Today's Date: 10/26/2022   PCP: Berneta Levins, DO REFERRING PROVIDER: Meredith Staggers, MD   END OF SESSION:  PT End of Session - 10/26/22 1228     Visit Number 8    Number of Visits 12    Date for PT Re-Evaluation 11/05/22    Authorization Type [employer]    Progress Note Due on Visit 10    PT Start Time 1148    PT Stop Time 1227    PT Time Calculation (min) 39 min    Equipment Utilized During Treatment Gait belt    Activity Tolerance Patient tolerated treatment well    Behavior During Therapy Flat affect;WFL for tasks assessed/performed                Past Medical History:  Diagnosis Date   Celiac disease    Retinal detachment    Stroke [facility])    Past Surgical History:  Procedure Laterality Date   CATARACT EXTRACTION Bilateral    Dr. Valetta Close   EYE SURGERY     FRACTURE SURGERY     GAS INSERTION Right 05/21/2021   Procedure: INSERTION OF GAS - C3F8;  Surgeon: Bernarda Caffey, MD;  Location: [place];  Service: Ophthalmology;  Laterality: Right;   GAS/FLUID EXCHANGE Right 05/21/2021   Procedure: GAS/FLUID EXCHANGE;  Surgeon: Bernarda Caffey, MD;  Location: [place];  Service: Ophthalmology;  Laterality: Right;   HERNIA REPAIR     IR CT HEAD LTD  07/17/2022   IR PERCUTANEOUS ART THROMBECTOMY/INFUSION INTRACRANIAL INC DIAG ANGIO  07/17/2022   IR US GUIDE VASC ACCESS RIGHT  07/17/2022   PARS PLANA VITRECTOMY Right 05/21/2021   Procedure: PARS PLANA VITRECTOMY WITH 25 GAUGE;  Surgeon: Bernarda Caffey, MD;  Location: [place];  Service: Ophthalmology;  Laterality: Right;   PERFLUORONE INJECTION Right 05/21/2021   Procedure: PERFLUORONE INJECTION;  Surgeon: Bernarda Caffey, MD;  Location: [place];  Service: Ophthalmology;  Laterality: Right;   PHOTOCOAGULATION WITH LASER Right 05/21/2021   Procedure: PHOTOCOAGULATION WITH LASER;  Surgeon: Bernarda Caffey, MD;  Location: [place];  Service: Ophthalmology;  Laterality: Right;   RADIOLOGY WITH ANESTHESIA N/A 07/17/2022   Procedure: IR WITH ANESTHESIA;  Surgeon: Radiologist, Medication, MD;  Location: [place];  Service: Radiology;  Laterality: N/A;   RETINAL DETACHMENT SURGERY     Patient Active Problem List   Diagnosis Date Noted   Paroxysmal atrial fibrillation ([place]) 09/01/2022   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 ([place]) 07/22/2022   Protein-calorie malnutrition, severe 07/19/2022   Stroke (cerebrum) ([place]) 07/17/2022   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   Celiac disease    Alcohol dependence ([place])    Bradycardia     ONSET DATE: 09/20/2022 (MD referral)  REFERRING DIAG:  Diagnosis  I63.81 (ICD-10-CM) - 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 ([place])    THERAPY DIAG:  Unsteadiness on feet  Other abnormalities of gait and mobility  Muscle weakness (generalized)  Rationale for Evaluation and Treatment: Rehabilitation  SUBJECTIVE:  SUBJECTIVE STATEMENT: Reports kelly "fixed it" (dizziness). Wife asking questions about helping pt roll over but pt denies having difficulty with this.  Pt accompanied by: significant other  PERTINENT HISTORY: Mediastinal mass, bradycardia; basal ganglia CVA from MD notes, distant hx of A-fib, celiac disease, retinal detachment, HLD, HTN, sleep apnea.   From hospital notes:   Presented to ED w/ acute onset of left-sided weakness as well as right frontal headache.  Admitted to [facility] on 07/17/2022.  See imaging.   PAIN:   Are you having pain? Yes: NPRS scale: 0/10 Pain location: mid-back Pain description: stabbing Aggravating factors: lying on back Relieving factors: unsure     Rates pain as 5/10 at night.    PRECAUTIONS: Fall  WEIGHT  BEARING RESTRICTIONS: No  FALLS: Has patient fallen in last 6 months? Yes. Number of falls 1  LIVING ENVIRONMENT: Lives with: lives with their spouse Lives in: House/apartment Stairs: Yes: Internal: 14 steps; on right going up and External: 14 steps; on left going up Has following equipment at home: Single point cane  PLOF: Independent with basic ADLs and Independent with gait  Enjoyed hiking 2-3 miles, foraging for mushrooms.  Joined the [facility] and has been swimming twice.  *Since CVA, mostly sits or lies on sofa during the day.  PATIENT GOALS: Pt's goals for therapy are to make pain stop, to get back to outdoor activities.  Wife's goal to pick up feet more.  OBJECTIVE:     TODAY'S TREATMENT: 10/26/22 Activity Comments  R sidelying  negative  L sidelying  Negative   resisted red TB monster walks ant/pos Cueing for longer steps throughout   grapevine Able to perform with good coordination and step length even with dual task activity   gait+ head turns/nods 2x17ft Cueing for larger amplitude head movements  gait+diagonal head 4x77ft movements  Slight imbalance; Cueing for larger amplitude head movements  Figure 8 turns forward and back  Small festinating steps with sharper turns; requested break to reset and control backwards steps   Walking with pivot turns  Small festinating steps           HOME EXERCISE PROGRAM Last updated: 10/26/22 Access Code: [facility] URL: [email or web address] Date: 10/26/2022 Prepared by: [facility]  Exercises - Seated Hamstring Stretch  - 2 x daily - 7 x weekly - 1 sets - 3 reps - 30 sec hold - Seated Figure 4 Piriformis Stretch  - 1-2 x daily - 7 x weekly - 1 sets - 3 reps - 30 sec hold - Lower Trunk Rotations  - 1-2 x daily - 7 x weekly - 1 sets - 5 reps - 15-30 sec hold - Hooklying Single Knee to Chest Stretch  - 1-2 x daily - 7 x weekly - 1 sets - 3-5 reps - 15-30 hold - Side to side weightshift   - 1-2 x daily - 5 x weekly - 2 sets - 10 reps - Standing Quarter Turn with Counter Support  - 1 x daily - 7 x weekly - 1 sets - 5-10 reps - Forward Step Up  - 1 x daily - 5 x weekly - 2 sets - 10 reps - 180 Degree Pivot Turn with Single Point Cane  - 1 x daily - 5 x weekly - 2 sets - 10 reps   PATIENT EDUCATION: Education details: HEP update with turns to be performed in doorway; encouraged improved HEP compliance as wife reports pt is not performing  HEP much  Person educated: Patient and Spouse Education method: Explanation, Demonstration, Tactile cues, Verbal cues, and Handouts Education comprehension: verbalized understanding and returned demonstration     -------------------------------------------------------------------------------------- (Objective measures below taken at initial evaluation:)  DIAGNOSTIC FINDINGS:  (From rehab discharge note) CT scan showed loss of gray-white differentiation in the right insular cortex lateral aspect of the right lentiform and right temporal tip consistent with acute infarction.  No acute hemorrhage.  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  Status post TNK as well as recanalization per interventional radiology.  MRI follow-up showed acute/subacute nonhemorrhagic infarct of the right caudate head and lentiform nucleus as noted on prior previous CT.   COGNITION: Overall cognitive status:  See speech notes; flat affect at eval   SENSATION: Light touch: WFL   MUSCLE LENGTH:  Passive from 90/90 position in supine Hamstrings: Right -30 deg; Left -40 deg   POSTURE: rounded shoulders, forward head, posterior pelvic tilt, and chooses to sit with posterior pelvic tilt, leaned back against wall.  LOWER EXTREMITY ROM:   Tightness in hamstrings, tenderness to palpation along superior aspect of IT band, especially greater trochanter; tightness at hip flexors in sidelying  LOWER EXTREMITY MMT:    MMT Right Eval  Left Eval  Hip flexion 4+ 4  Hip extension    Hip abduction 3+ 4  Hip adduction 4 4  Hip internal rotation    Hip external rotation    Knee flexion 4 3+  Knee extension 4 4  Ankle dorsiflexion 3+ 3+  Ankle plantarflexion    Ankle inversion    Ankle eversion    (Blank rows = not tested)  BED MOBILITY:  Independent, extra time  TRANSFERS: Assistive device utilized: None  Sit to stand: Modified independence Stand to sit: Modified independence  STAIRS: Level of Assistance: SBA Stair Negotiation Technique: Alternating Pattern  with Bilateral Rails Number of Stairs: 2-3  Height of Stairs: 4"-6"  Comments: Decreased foot clearance  GAIT: Gait pattern:  veers to R with gait challenges, step through pattern, decreased ankle dorsiflexion- Right, decreased ankle dorsiflexion- Left, genu recurvatum- Left, wide BOS, poor foot clearance- Right, and poor foot clearance- Left Distance walked: 50 ft x 4 Assistive device utilized: None Level of assistance: SBA Comments: veers to R with balance challenges  FUNCTIONAL TESTS:  10 meter walk test: 11.78 sec (2.78 ft/sec) self-selected; FAST:  8.47 sec = 3.87 ft/sec Functional gait assessment: 14/30 (scores <22/30 indicate increased fall risk); score has decreased from 17/30 at initial eval in hospital    PATIENT SURVEYS:  NA   M-CTSIB  Condition 1: Firm Surface, EO 30 Sec, Normal Sway  Condition 2: Firm Surface, EC 30 Sec, Mild Sway  Condition 3: Foam Surface, EO 30 Sec, Mild Sway  Condition 4: Foam Surface, EC 30 Sec, Mild Sway     GOALS: Goals reviewed with patient? Yes  SHORT TERM GOALS: Target date: 10/08/2022  Pt will be supervision with HEP for improved flexibility, strength, balance, and gait. Baseline: Goal status: MET, 10/13/2022  2.  Pt will report pain in hips at night <5/10, for improved sleep, bed mobility positioning. Baseline: 10/13/22:  rates previous night as 8/10, previous night to that was 0/10  Goal status:  GOAL PARTIALLY MET, 10/13/22   LONG TERM GOALS: Target date: 11/05/2022  Pt will be independent with HEP for improved balance, flexibility, strength, gait. Baseline:  Goal status: IN PROGRESS  2.  Pt will improve FGA score to  at least 20/30 to decrease fall risk. Baseline: 14/30 Goal status: IN PROGRESS  3.  Pt will improve gait velocity to at least 3 ft/sec self-selected speed. Baseline: 2.78 ft/sec Goal status: IN PROGRESS  4.  Pt will ambulate at least 1000 ft, indoor and outdoor surfaces, modified independently for return to outdoor gait hiking. Baseline:  Goal status: IN PROGRESS    ASSESSMENT:  CLINICAL IMPRESSION: Patient arrived to session with report of resolution of dizziness. Positional testing was negative today. Proceeded with balance training focusing on head movements and turns. Patient struggles maintaining large amplitude of movement throughout activities and requires verbal cues to correct. Turns revealed small festinating steps, worse with sharper turns and with poor ability to correct according to cues. Updated HEP to safely work on turns in doorway and encouraged improved HEP compliance at home. Patient and wife reported understanding and without complaints upon leaving.   OBJECTIVE IMPAIRMENTS: Abnormal gait, decreased balance, decreased knowledge of use of DME, decreased mobility, difficulty walking, decreased ROM, decreased strength, impaired flexibility, and pain.   ACTIVITY LIMITATIONS: standing, sleeping, transfers, bed mobility, and locomotion level  PARTICIPATION LIMITATIONS: community activity and fishing, hiking  PERSONAL FACTORS: 3+ comorbidities: see above  are also affecting patient's functional outcome.   REHAB POTENTIAL: Good  CLINICAL DECISION MAKING: Evolving/moderate complexity  EVALUATION COMPLEXITY: Moderate  PLAN:  PT FREQUENCY: 2x/week  PT DURATION: 6 weeks (including eval week)  PLANNED INTERVENTIONS: Therapeutic exercises,  Therapeutic activity, Neuromuscular re-education, Balance training, Gait training, Patient/Family education, Self Care, Joint mobilization, Stair training, DME instructions, Cryotherapy, and Manual therapy  PLAN FOR NEXT SESSION: turns, backwards walking; Work on hip strengthening-sidestepping/resisted, monster walk, turns, braiding, step ups.   Balance and gait training.   Janene Harvey, PT, DPT 10/26/22 12:32 PM  [facility] [address] [place], [place], [place] [address] Phone # [phone] Fax # [phone]

## 2022-10-26 ENCOUNTER — Other Ambulatory Visit: Payer: Self-pay | Admitting: Physical Medicine & Rehabilitation

## 2022-10-26 ENCOUNTER — Ambulatory Visit: Payer: Medicare HMO | Admitting: Physical Therapy

## 2022-10-26 ENCOUNTER — Encounter: Payer: Self-pay | Admitting: Physical Therapy

## 2022-10-26 ENCOUNTER — Other Ambulatory Visit (HOSPITAL_COMMUNITY): Payer: Self-pay

## 2022-10-26 DIAGNOSIS — R2681 Unsteadiness on feet: Secondary | ICD-10-CM

## 2022-10-26 DIAGNOSIS — R2689 Other abnormalities of gait and mobility: Secondary | ICD-10-CM

## 2022-10-26 DIAGNOSIS — M6281 Muscle weakness (generalized): Secondary | ICD-10-CM

## 2022-10-26 DIAGNOSIS — R41840 Attention and concentration deficit: Secondary | ICD-10-CM | POA: Diagnosis not present

## 2022-10-26 NOTE — Telephone Encounter (Signed)
Will need to request cardiology clearance prior to the procedure given recent stroke and also clearance to hold anticoagulation.  I am fine with scheduling procedure, EGD sooner if we get clearance from cardiology.   Shirlean Mylar, can you please send my recent office visit note and request cardiology clearance.  Thank you

## 2022-10-26 NOTE — Telephone Encounter (Signed)
Ok thank you. Yes, he will be done at [facility]

## 2022-10-26 NOTE — Telephone Encounter (Signed)
Spoke with the spouse. The patient has decided he is okay with waiting the full 6 months post CVA before scheduling his procedures.  Shirlean Mylar, you do not need to pursue the cardiology clearance at this time.  Will this patient be done in the [place]?

## 2022-10-26 NOTE — Telephone Encounter (Signed)
Noted Douglas Cox, Thank You  Recall in for 3 months as a reminder , Douglas Cox will let Douglas Cox know when it comes across

## 2022-10-27 ENCOUNTER — Other Ambulatory Visit (HOSPITAL_COMMUNITY): Payer: Self-pay

## 2022-10-27 ENCOUNTER — Other Ambulatory Visit: Payer: Self-pay

## 2022-10-27 ENCOUNTER — Ambulatory Visit: Payer: Medicare HMO | Admitting: Occupational Therapy

## 2022-10-27 DIAGNOSIS — R2681 Unsteadiness on feet: Secondary | ICD-10-CM

## 2022-10-27 DIAGNOSIS — I69318 Other symptoms and signs involving cognitive functions following cerebral infarction: Secondary | ICD-10-CM

## 2022-10-27 DIAGNOSIS — R41840 Attention and concentration deficit: Secondary | ICD-10-CM

## 2022-10-27 MED ORDER — GABAPENTIN 100 MG PO CAPS
100.0000 mg | ORAL_CAPSULE | Freq: Every day | ORAL | 1 refills | Status: DC
  Filled 2022-10-27: qty 30, 30d supply, fill #0

## 2022-10-27 MED ORDER — APIXABAN 5 MG PO TABS
5.0000 mg | ORAL_TABLET | Freq: Two times a day (BID) | ORAL | 0 refills | Status: DC
  Filled 2022-10-27 – 2022-11-09 (×3): qty 120, 60d supply, fill #0

## 2022-10-27 MED ORDER — TIZANIDINE HCL 4 MG PO TABS
4.0000 mg | ORAL_TABLET | Freq: Three times a day (TID) | ORAL | 0 refills | Status: DC | PRN
  Filled 2022-10-27: qty 40, 14d supply, fill #0

## 2022-10-27 NOTE — Therapy (Addendum)
OUTPATIENT OCCUPATIONAL THERAPY  Treatment Note  Patient Name: Douglas Cox MRN: [id number] DOB:06-08-1951, 72 y.o., male Today's Date: 10/27/2022  PCP: Dr. Zara Council ([facility]) REFERRING PROVIDER: Charlett Blake, MD   END OF SESSION:  OT End of Session - 10/27/22 1021     Visit Number 15    Number of Visits 23    Date for OT Re-Evaluation 11/05/22    Authorization Type [employer]    OT Start Time 1020    OT Stop Time 1100    OT Time Calculation (min) 40 min    Activity Tolerance Patient tolerated treatment well    Behavior During Therapy Flat affect;Restless                          Past Medical History:  Diagnosis Date   Celiac disease    Retinal detachment    Stroke [name])    Past Surgical History:  Procedure Laterality Date   CATARACT EXTRACTION Bilateral    Dr. Valetta Close   EYE SURGERY     FRACTURE SURGERY     GAS INSERTION Right 05/21/2021   Procedure: INSERTION OF GAS - C3F8;  Surgeon: Bernarda Caffey, MD;  Location: [place];  Service: Ophthalmology;  Laterality: Right;   GAS/FLUID EXCHANGE Right 05/21/2021   Procedure: GAS/FLUID EXCHANGE;  Surgeon: Bernarda Caffey, MD;  Location: [place];  Service: Ophthalmology;  Laterality: Right;   HERNIA REPAIR     IR CT HEAD LTD  07/17/2022   IR PERCUTANEOUS ART THROMBECTOMY/INFUSION INTRACRANIAL INC DIAG ANGIO  07/17/2022   IR US GUIDE VASC ACCESS RIGHT  07/17/2022   PARS PLANA VITRECTOMY Right 05/21/2021   Procedure: PARS PLANA VITRECTOMY WITH 25 GAUGE;  Surgeon: Bernarda Caffey, MD;  Location: [place];  Service: Ophthalmology;  Laterality: Right;   PERFLUORONE INJECTION Right 05/21/2021   Procedure: PERFLUORONE INJECTION;  Surgeon: Bernarda Caffey, MD;  Location: [place];  Service: Ophthalmology;  Laterality: Right;   PHOTOCOAGULATION WITH LASER Right 05/21/2021   Procedure: PHOTOCOAGULATION WITH LASER;  Surgeon: Bernarda Caffey, MD;  Location: [place];  Service: Ophthalmology;  Laterality: Right;   RADIOLOGY  WITH ANESTHESIA N/A 07/17/2022   Procedure: IR WITH ANESTHESIA;  Surgeon: Radiologist, Medication, MD;  Location: [place];  Service: Radiology;  Laterality: N/A;   RETINAL DETACHMENT SURGERY     Patient Active Problem List   Diagnosis Date Noted   Paroxysmal atrial fibrillation ([place]) 09/01/2022   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 ([place]) 07/22/2022   Protein-calorie malnutrition, severe 07/19/2022   Stroke (cerebrum) ([place]) 07/17/2022   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   Celiac disease    Alcohol dependence ([place])    Bradycardia     ONSET DATE: 08/04/22 (referral date)  REFERRING DIAG: I63.81 (ICD-10-CM) - 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  Pt took tramadol overnight and feels that he is a little groggy this morning. Pt accompanied by: self and significant other (spouse)  PERTINENT HISTORY: 72 y.o. male with a PMHx of cataracts, celiac disease and retinal detachment who presents to the ED with acute onset of left  hemiplegia, right gaze deviation and left hemineglect.MRI: Acute/subacute nonhemorrhagic infarct of the right caudate head and lentiform nucleus; acute/subacute nonhemorrhagic infarct of the left caudate head and lentiform nucleus spanning the internal capsule; Acute/subacute nonhemorrhagic infarct of the cortex of the right temporal tip.  PRECAUTIONS: Fall  WEIGHT BEARING RESTRICTIONS: No  PAIN:  Are you having pain? No  FALLS: Has patient fallen in last 6 months? Yes. Number of falls 2 - 1 when he had the stroke he fell  forward and hit his head and fell off a ladder  LIVING ENVIRONMENT: Lives with: lives with their spouse Lives in: House/apartment Stairs: Yes: Internal: 16 steps; on right going up and External: 4 steps to front door, 16 steps from garage to main floor of home steps; on right going up Has following equipment at home: Single point cane  PLOF: Independent, working as an Pension scheme manager  PATIENT GOALS: to improve thought process, speech  OBJECTIVE:   HAND DOMINANCE: Right  ADLs: Overall ADLs: Independent with all bathing/dressing.    IADLs: Shopping: Not interested in going to the store, but would have gone prior to CVA Light housekeeping: is able to assist with household tasks when spouse asks for assistance or directs him to complete Meal Prep: Spouse is proving supervision for meal prep, but has been able to cook fish Community mobility: MD has not cleared pt to drive Medication management: Spouse administers medication, is having difficulty with administering B12 shot Financial management: Spouse is currently paying bills, but pt would have completed prior to CVA   COORDINATION: Finger Nose Finger test: mild ataxia, dysmetria LUE 9 Hole Peg test: Right: 32.37 sec; Left: 50.04 sec  Noted some difficulty with sequencing, requiring cues for recall and sequencing of directions Box and Blocks:  Right 38 blocks, Left 40 blocks (Pt frequently picking up 2-3 blocks at a time despite mod-max cues to pick up only one block at a time.    COGNITION: Overall cognitive status: Impaired Orientation Level: Person;Place;Situation Person: Oriented Place: Disoriented Situation: Disoriented Memory: Impaired Memory Impairment: Storage deficit;Retrieval deficit;Decreased recall of new information;Decreased short term memory Attention: Focused Awareness: Impaired Awareness Impairment: Intellectual impairment Problem Solving: Impaired Problem Solving Impairment: Verbal basic;Functional basic Behaviors:  Restless;Impulsive Safety/Judgment: Impaired  OBSERVATIONS: Pt demonstrating attention at focused level during 9 hole peg test, requiring frequent cues and redirection to attend to 2 step task with Box and Blocks.  Pt demonstrating decreased interest and motivation during OT evaluation, however pt and spouse agreeable to continue with OT at this time to focus on memory and return to engagement in IADLs and leisure pursuits.  TODAY'S TREATMENT:                                  10/27/22 Dual tasking: Engaged in ambulating while verbally filling in the blank with focus on completing simple phrases for word retrieval while focusing on volume/projection.  Pt demonstrating good attention and word retrieval during simple ambulation and completion of phrases.  OT increased motor and cognitive challenge during next dual task activity.  OT directed pt to toss ball to self while naming synonym/antonym to word provided.  Pt with increased difficulty with dual tasking both in regards to motor and cognition. Pt frequently stopping ambulation and ball toss to identify words.  Pt demonstrating mild increased gross motor sequencing with ball toss with OT, still requiring increased time for naming.  Discussed functional carryover to reciting lines  and following stage directions. Provide directions: OT directed pt to provide driving directions from clinic to follow up appt for MRI.  Pt demonstrating slowed speed this session with naming items and street names as well as decreased volume.  Pt able to correctly provide instructions, however requiring increased time compared to previous session.  Reiterated possible impact of tramadol on current level and clarity of thinking.    10/19/22 Medication management: OT facilitated arrangement of medications with pt and spouse to ensure safe completion, recall, and not overlapping medications that should not be given at same time. OT utilized Nurse, mental health to aid pt  in arranging  medications and arranging system to allow for increased recall and carryover.  OT recommended use of color code system to identify AM/PM meds vs pill box to aid in recall of AM/PM and whether taken or not. Leisure pursuits: engaged in discussion about typical tasks that would occupy pt's time during the day.  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  Pt had recently reported "overwhelmed" to attempt returning to work shop and has not participated in much in regards to taking photographs.  OT encouraged pt to resume working with camera and taking pictures to then bring in for next session. Handwriting: OT directed pt to write 10 items that he would take pictures of.  Pt requiring mod cues for recall and options to "jog" memory.  Pt then able to come up with 4 before requiring additional cues.  Pt's handwriting continues to be quite small and 90% legible when pt in a hurry and/or when not motivated by task.    10/13/22 Attention/organization:  engaged in organization with planning for a dinner party.  OT challenged pt to create a menu, making a grocery list, making a list of household tasks to complete prior to party, and then organizing order of items to ensure meal prepared and ready on time/at same time.  Pt demonstrating decreased organization when making lists requiring mod cues to keep on task.  Pt with most difficulty organizing meal time, stating rice and broccoli are prepared in same manner.  Handwriting: pt continuing to demonstrate small size with writing, however much improved legibility with use of lined paper and intermittent cues to attend to quality of handwriting. Phone use: pt taking ~1 minute to locate spouse's phone number in contacts.  Noted pt opening facebook and then email messages when attempting to locate spouse's phone number.  Discussed short cuts to increase time spent and to maintain focused attention to task.  OT challenged pt to locate PCP  office phone number.  Pt requiring increased time and requiring max cues for attention and problem solving with use of internet browser to locate.    PATIENT EDUCATION: Education details: Educated on functional carryover of dual tasking Person educated: Patient and Spouse Education method: Explanation Education comprehension: verbalized understanding and needs further education  HOME EXERCISE PROGRAM: TBD   GOALS: Goals reviewed with patient? Yes  SHORT TERM GOALS: Target date: 10/15/22  Pt will be able to identify/verbalize steps and/or items needed to complete simple functional task (simple snack prep, laundry task, etc) with supervision. Baseline: Goal status: Met - 10/13/22  2.  Patient will be able to maintain alternating attention for 2 mins during structured task without additional cues for attention.  Baseline:  Goal status: IN PROGRESS  3.  Pt will be able to utilize phone to locate contacts and make emergency calls as needed  without cues in <90 seconds. Baseline:  Goal status: Met - 10/13/22   LONG TERM GOALS: Target date: 11/05/22  Pt will demonstrate and/or verbalize ability to sequence advanced meal prep task (to include having all meal items completed at same time) at Mod I level. Baseline:  Goal status: IN PROGRESS  2.  Pt will navigate a moderately busy environment, following multi-step commands with 90% accuracy to simulate return to community reintegration. Baseline:  Goal status: IN PROGRESS  3.  Pt will be able to utilize phone to look up phone number for physician and/or other frequently used number/location without cues in <90 seconds. Baseline:  Goal status: IN PROGRESS  4.  Pt will be able to verbalize wayfinding/directions from home to familiar locations in the area without cues. Baseline:  Goal status: Met - 10/27/22  5.  Pt will demonstrate ability to maintain selective attention for 5 mins during structured task without additional cues for  attention.   Baseline:  Goal status: IN PROGRESS    ASSESSMENT:  CLINICAL IMPRESSION: Pt demonstrating decreased ability to engage in cognitive/motor dual tasking with incorporation of ball toss.  Pt demonstrating decreased speed with word retrieval and processing, possibly due to taking tramadol late at night.  Pt with decreased awareness or insight into challenges.  PERFORMANCE DEFICITS: in functional skills including IADLs, coordination, Fine motor control, Gross motor control, body mechanics, endurance, decreased knowledge of precautions, decreased knowledge of use of DME, and UE functional use, cognitive skills including attention, energy/drive, memory, orientation, problem solving, safety awareness, sequencing, and temperament/personality, and psychosocial skills including coping strategies, habits, and routines and behaviors.   IMPAIRMENTS: are limiting patient from IADLs and leisure.   CO-MORBIDITIES: may have co-morbidities  that affects occupational performance. Patient will benefit from skilled OT to address above impairments and improve overall function.  MODIFICATION OR ASSISTANCE TO COMPLETE EVALUATION: Min-Moderate modification of tasks or assist with assess necessary to complete an evaluation.  OT OCCUPATIONAL PROFILE AND HISTORY: Detailed assessment: Review of records and additional review of physical, cognitive, psychosocial history related to current functional performance.  CLINICAL DECISION MAKING: Moderate - several treatment options, min-mod task modification necessary  REHAB POTENTIAL: Good  EVALUATION COMPLEXITY: Moderate    PLAN:  OT FREQUENCY: 1-2x/week  OT DURATION: 6 weeks  PLANNED INTERVENTIONS: self care/ADL training, therapeutic exercise, therapeutic activity, balance training, functional mobility training, patient/family education, cognitive remediation/compensation, psychosocial skills training, energy conservation, coping strategies training, and  DME and/or AE instructions  RECOMMENDED OTHER SERVICES: NA  CONSULTED AND AGREED WITH PLAN OF CARE: Patient and family member/caregiver  PLAN FOR NEXT SESSION: selective attention - functional tasks with providing directions, making phone calls, preparing simple meal.  Complex/simple organization/planning.  Dual tasking with playing guitar while singing.  Discuss photography and identify areas keeping pt from engaging in leisure tasks.   Rosalio Loud, OTR/L 10/27/2022, 10:21 AM    OCCUPATIONAL THERAPY DISCHARGE SUMMARY  Visits from Start of Care: 15  Current functional level related to goals / functional outcomes: Unsure as pt did not return since above treatment session.  Pt was demonstrating improvements in phone use and engagement in usual cooking routine.  Pt continued to demonstrate difficulty with cognitive/motor dual tasking and continued to demonstrate decreased spontaneous engagement in leisure pursuits.   Remaining deficits: Difficulty with cognitive/motor dual tasking, decreased selective and alternating attention   Education / Equipment: Educated on dual tasking, problem solving and safety awareness with increased participation in meal prep and higher level ADLs  Patient agrees to discharge. Patient goals were partially met. Patient is being discharged due to not returning since the last visit.  Rosalio Loud, OT 11/30/22

## 2022-10-28 ENCOUNTER — Ambulatory Visit: Payer: Medicare HMO | Admitting: Occupational Therapy

## 2022-10-28 ENCOUNTER — Other Ambulatory Visit: Payer: Self-pay

## 2022-10-28 ENCOUNTER — Other Ambulatory Visit (HOSPITAL_COMMUNITY): Payer: Self-pay

## 2022-10-28 ENCOUNTER — Ambulatory Visit: Payer: Medicare HMO | Admitting: Physical Therapy

## 2022-10-28 DIAGNOSIS — R0681 Apnea, not elsewhere classified: Secondary | ICD-10-CM | POA: Diagnosis not present

## 2022-10-28 MED ORDER — AMOXICILLIN-POT CLAVULANATE 875-125 MG PO TABS
1.0000 | ORAL_TABLET | Freq: Two times a day (BID) | ORAL | 0 refills | Status: DC
  Filled 2022-10-28 (×2): qty 10, 5d supply, fill #0

## 2022-10-28 NOTE — Procedures (Signed)
       Patient Name: Douglas Cox, Douglas Cox Date: 10/22/2022 Gender: Male D.O.B: 1950-10-12 Age (years): 71 Referring Provider: Gilmore Laroche MD Height (inches): 70 Interpreting Physician: Baird Lyons MD, ABSM Weight (lbs): 149 RPSGT: Baxter Flattery BMI: 21 MRN: [id number] Neck Size: 14.50  CLINICAL INFORMATION Sleep Study Type: NPSG Indication for sleep study: Snoring, Witnesses Apnea / Gasping During Sleep Epworth Sleepiness Score: 4  SLEEP STUDY TECHNIQUE As per the AASM Manual for the Scoring of Sleep and Associated Events v2.3 (April 2016) with a hypopnea requiring 4% desaturations.  The channels recorded and monitored were frontal, central and occipital EEG, electrooculogram (EOG), submentalis EMG (chin), nasal and oral airflow, thoracic and abdominal wall motion, anterior tibialis EMG, snore microphone, electrocardiogram, and pulse oximetry.  MEDICATIONS Medications self-administered by patient taken the night of the study : none reported  SLEEP ARCHITECTURE The study was initiated at 9:41:25 PM and ended at 4:26:12 AM.  Sleep onset time was 9.5 minutes and the sleep efficiency was 78.4%. The total sleep time was 317.3 minutes.  Stage REM latency was 43.0 minutes.  The patient spent 9.1% of the night in stage N1 sleep, 67.1% in stage N2 sleep, 0.0% in stage N3 and 23.8% in REM.  Alpha intrusion was absent.  Supine sleep was 11.22%.  RESPIRATORY PARAMETERS The overall apnea/hypopnea index (AHI) was 3.2 per hour. There were 13 total apneas, including 13 obstructive, 0 central and 0 mixed apneas. There were 4 hypopneas and 0 RERAs.  The AHI during Stage REM sleep was 9.5 per hour.  AHI while supine was 20.2 per hour.  The mean oxygen saturation was 93.6%. The minimum SpO2 during sleep was 90.0%.  snoring was noted during this study.  CARDIAC DATA The 2 lead EKG demonstrated sinus rhythm. The mean heart rate was 43.2 beats per minute. Other EKG findings  include: None.  LEG MOVEMENT DATA The total PLMS were 0 with a resulting PLMS index of 0.0. Associated arousal with leg movement index was 0.0 .  IMPRESSIONS - No significant obstructive sleep apnea occurred during this study (AHI = 3.2/h). - The patient had minimal or no oxygen desaturation during the study (Min O2 = 90.0%) - No significant snoring was audible during this study. - No cardiac abnormalities were noted during this study. - Clinically significant periodic limb movements did not occur during sleep. No significant associated arousals. - Tech reported patient was in pain (unspecified) during the night.  DIAGNOSIS - Normal study  RECOMMENDATIONS - Manage for symptoms based on clinical judgment. - Be careful with alcohol, sedatives and other CNS depressants that may worsen sleep apnea and disrupt normal sleep architecture. - Sleep hygiene should be reviewed to assess factors that may improve sleep quality. - Weight management and regular exercise should be initiated or continued if appropriate.  [Electronically signed] 10/28/2022 02:17 PM  Baird Lyons MD, [place], American Board of Sleep Medicine NPI: [id number]                      [place] [place] [facility]  ELECTRONICALLY SIGNED ON:  10/28/2022, 2:13 PM [employer] PH: [phone]   FX: [phone] [place]

## 2022-10-28 NOTE — Patient Outreach (Signed)
First telephone outreach attempt to obtain mRS. No answer. Left message for returned call.  Lorretta Kerce [facility]-Care Management Assistant [phone]  

## 2022-10-29 ENCOUNTER — Other Ambulatory Visit: Payer: Self-pay

## 2022-10-29 ENCOUNTER — Other Ambulatory Visit (HOSPITAL_COMMUNITY): Payer: Self-pay

## 2022-11-01 ENCOUNTER — Other Ambulatory Visit: Payer: Self-pay

## 2022-11-01 ENCOUNTER — Encounter: Payer: Self-pay | Admitting: Pharmacist

## 2022-11-01 NOTE — Patient Outreach (Signed)
Second telephone outreach attempt to obtain mRS. No answer. Left message for returned call.  Jasneet Schobert [facility]-Care Management Assistant [phone]  

## 2022-11-01 NOTE — Patient Outreach (Addendum)
Telephone outreach to patient's wife to obtain mRS was successfully completed. MRS= 2  [place] Care Management Assistant [phone]

## 2022-11-02 ENCOUNTER — Encounter: Payer: Medicare HMO | Admitting: Occupational Therapy

## 2022-11-03 ENCOUNTER — Encounter: Payer: Medicare HMO | Admitting: Occupational Therapy

## 2022-11-03 ENCOUNTER — Encounter: Payer: Self-pay | Admitting: Physical Medicine & Rehabilitation

## 2022-11-03 ENCOUNTER — Encounter: Payer: Medicare HMO | Attending: Physical Medicine & Rehabilitation | Admitting: Physical Medicine & Rehabilitation

## 2022-11-03 ENCOUNTER — Ambulatory Visit: Payer: Medicare HMO | Attending: Physical Medicine & Rehabilitation

## 2022-11-03 VITALS — BP 127/79 | HR 46 | Ht 70.0 in | Wt 141.0 lb

## 2022-11-03 DIAGNOSIS — M7061 Trochanteric bursitis, right hip: Secondary | ICD-10-CM | POA: Insufficient documentation

## 2022-11-03 DIAGNOSIS — M7062 Trochanteric bursitis, left hip: Secondary | ICD-10-CM | POA: Insufficient documentation

## 2022-11-03 DIAGNOSIS — M6281 Muscle weakness (generalized): Secondary | ICD-10-CM | POA: Diagnosis present

## 2022-11-03 DIAGNOSIS — K6289 Other specified diseases of anus and rectum: Secondary | ICD-10-CM | POA: Insufficient documentation

## 2022-11-03 DIAGNOSIS — R2689 Other abnormalities of gait and mobility: Secondary | ICD-10-CM | POA: Diagnosis present

## 2022-11-03 DIAGNOSIS — R2681 Unsteadiness on feet: Secondary | ICD-10-CM

## 2022-11-03 DIAGNOSIS — R293 Abnormal posture: Secondary | ICD-10-CM | POA: Diagnosis present

## 2022-11-03 DIAGNOSIS — I6381 Other cerebral infarction due to occlusion or stenosis of small artery: Secondary | ICD-10-CM | POA: Diagnosis present

## 2022-11-03 DIAGNOSIS — I69318 Other symptoms and signs involving cognitive functions following cerebral infarction: Secondary | ICD-10-CM

## 2022-11-03 NOTE — Therapy (Signed)
OUTPATIENT PHYSICAL THERAPY NEURO TREATMENT NOTE and D/C Summary   Patient Name: Douglas Cox MRN: [id number] DOB:14-Jul-1951, 72 y.o., male Today's Date: 11/03/2022   PCP: Berneta Levins, DO REFERRING PROVIDER: Meredith Staggers, MD   PHYSICAL THERAPY DISCHARGE SUMMARY  Visits from Start of Care: 9  Current functional level related to goals / functional outcomes: Able to meet all STG/LTG   Remaining deficits: Back/hip pain   Education / Equipment: HEP and education regarding centralization of pain   Patient agrees to discharge. Patient goals were met. Patient is being discharged due to meeting the stated rehab goals.  END OF SESSION:  PT End of Session - 11/03/22 1400     Visit Number 9    Number of Visits 12    Date for PT Re-Evaluation 11/05/22    Authorization Type [employer]    Progress Note Due on Visit 10    PT Start Time 1400    PT Stop Time 1445    PT Time Calculation (min) 45 min    Equipment Utilized During Treatment Gait belt    Activity Tolerance Patient tolerated treatment well    Behavior During Therapy Flat affect;WFL for tasks assessed/performed                Past Medical History:  Diagnosis Date   Celiac disease    Retinal detachment    Stroke    Past Surgical History:  Procedure Laterality Date   CATARACT EXTRACTION Bilateral    Dr. Valetta Close   EYE SURGERY     FRACTURE SURGERY     GAS INSERTION Right 05/21/2021   Procedure: INSERTION OF GAS - C3F8;  Surgeon: Bernarda Caffey, MD;  Location: [place];  Service: Ophthalmology;  Laterality: Right;   GAS/FLUID EXCHANGE Right 05/21/2021   Procedure: GAS/FLUID EXCHANGE;  Surgeon: Bernarda Caffey, MD;  Location: [place];  Service: Ophthalmology;  Laterality: Right;   HERNIA REPAIR     IR CT HEAD LTD  07/17/2022   IR PERCUTANEOUS ART THROMBECTOMY/INFUSION INTRACRANIAL INC DIAG ANGIO  07/17/2022   IR US GUIDE VASC ACCESS RIGHT  07/17/2022   PARS PLANA VITRECTOMY Right 05/21/2021    Procedure: PARS PLANA VITRECTOMY WITH 25 GAUGE;  Surgeon: Bernarda Caffey, MD;  Location: [place];  Service: Ophthalmology;  Laterality: Right;   PERFLUORONE INJECTION Right 05/21/2021   Procedure: PERFLUORONE INJECTION;  Surgeon: Bernarda Caffey, MD;  Location: [place];  Service: Ophthalmology;  Laterality: Right;   PHOTOCOAGULATION WITH LASER Right 05/21/2021   Procedure: PHOTOCOAGULATION WITH LASER;  Surgeon: Bernarda Caffey, MD;  Location: [place];  Service: Ophthalmology;  Laterality: Right;   RADIOLOGY WITH ANESTHESIA N/A 07/17/2022   Procedure: IR WITH ANESTHESIA;  Surgeon: Radiologist, Medication, MD;  Location: [place];  Service: Radiology;  Laterality: N/A;   RETINAL DETACHMENT SURGERY     Patient Active Problem List   Diagnosis Date Noted   Perianal cyst 11/03/2022   Trochanteric bursitis of both hips 11/03/2022   Paroxysmal atrial fibrillation 09/01/2022   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 07/22/2022   Protein-calorie malnutrition, severe 07/19/2022   Stroke (cerebrum) 07/17/2022   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   Celiac disease    Alcohol dependence    Bradycardia     ONSET DATE: 09/20/2022 (MD referral)  REFERRING DIAG:  Diagnosis  I63.81 (ICD-10-CM) - 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 (HCC)    THERAPY DIAG:  Other symptoms and signs involving cognitive functions following cerebral infarction  Unsteadiness on feet  Other abnormalities of gait and mobility  Muscle weakness (generalized)  Abnormal posture  Rationale for Evaluation and Treatment: Rehabilitation  SUBJECTIVE:                                                                                                                                                                                             SUBJECTIVE STATEMENT: Right Hip has been increasing painful, had MRI last Monday of hip/spine. One MD thinks bulging disc, another thinks hip bursitis  Pt accompanied by:  significant other  PERTINENT HISTORY: Mediastinal mass, bradycardia; basal ganglia CVA from MD notes, distant hx of A-fib, celiac disease, retinal detachment, HLD, HTN, sleep apnea.   From hospital notes:   Presented to ED w/ acute onset of left-sided weakness as well as right frontal headache.  Ad[facility] to [facility] on 07/17/2022.  See imaging.   PAIN:   Are you having pain? Yes: NPRS scale: 0/10 Pain location: mid-back Pain description: stabbing Aggravating factors: lying on back Relieving factors: unsure     Rates pain as 5/10 at night.    PRECAUTIONS: Fall  WEIGHT BEARING RESTRICTIONS: No  FALLS: Has patient fallen in last 6 months? Yes. Number of falls 1  LIVING ENVIRONMENT: Lives with: lives with their spouse Lives in: House/apartment Stairs: Yes: Internal: 14 steps; on right going up and External: 14 steps; on left going up Has following equipment at home: Single point cane  PLOF: Independent with basic ADLs and Independent with gait  Enjoyed hiking 2-3 miles, foraging for mushrooms.  Joined the [facility] and has been swimming twice.  *Since CVA, mostly sits or lies on sofa during the day.  PATIENT GOALS: Pt's goals for therapy are to make pain stop, to get back to outdoor activities.  Wife's goal to pick up feet more.  OBJECTIVE:    TODAY'S TREATMENT: 11/03/22 Activity Comments  Seated hip abd/ER Static hold x 15 sec, strong force, no pain in lateral hips  Single leg stance X30 sec, no lateral hip pain  Prone lumbar extension Pt education on repeated extensions vs flexion movements to assess what centralizes   Gait speed 3.66 ft/sec  FGA         TODAY'S TREATMENT: 10/26/22 Activity Comments  R sidelying  negative  L sidelying  Negative   resisted red TB monster walks ant/pos Cueing for longer steps throughout   grapevine Able to perform with good coordination and step length even with dual task activity   gait+ head turns/nods 2x8ft Cueing for larger amplitude head  movements  gait+diagonal head 4x65ft movements  Slight imbalance; Cueing for larger amplitude head movements  Figure  8 turns forward and back  Small festinating steps with sharper turns; requested break to reset and control backwards steps   Walking with pivot turns  Small festinating steps           HOME EXERCISE PROGRAM Last updated: 10/26/22 Access Code: [facility] URL: [email or web address] Date: 10/26/2022 Prepared by: [facility]  Exercises - Seated Hamstring Stretch  - 2 x daily - 7 x weekly - 1 sets - 3 reps - 30 sec hold - Seated Figure 4 Piriformis Stretch  - 1-2 x daily - 7 x weekly - 1 sets - 3 reps - 30 sec hold - Lower Trunk Rotations  - 1-2 x daily - 7 x weekly - 1 sets - 5 reps - 15-30 sec hold - Hooklying Single Knee to Chest Stretch  - 1-2 x daily - 7 x weekly - 1 sets - 3-5 reps - 15-30 hold - Side to side weightshift  - 1-2 x daily - 5 x weekly - 2 sets - 10 reps - Standing Quarter Turn with Counter Support  - 1 x daily - 7 x weekly - 1 sets - 5-10 reps - Forward Step Up  - 1 x daily - 5 x weekly - 2 sets - 10 reps - 180 Degree Pivot Turn with Single Point Cane  - 1 x daily - 5 x weekly - 2 sets - 10 reps   PATIENT EDUCATION: Education details: HEP update with turns to be performed in doorway; encouraged improved HEP compliance as wife reports pt is not performing HEP much  Person educated: Patient and Spouse Education method: Explanation, Demonstration, Tactile cues, Verbal cues, and Handouts Education comprehension: verbalized understanding and returned demonstration     -------------------------------------------------------------------------------------- (Objective measures below taken at initial evaluation:)  DIAGNOSTIC FINDINGS:  (From rehab discharge note) CT scan showed loss of gray-white differentiation in the right insular cortex lateral aspect of the right lentiform and right temporal tip consistent  with acute infarction.  No acute hemorrhage.  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  Status post TNK as well as recanalization per interventional radiology.  MRI follow-up showed acute/subacute nonhemorrhagic infarct of the right caudate head and lentiform nucleus as noted on prior previous CT.   COGNITION: Overall cognitive status:  See speech notes; flat affect at eval   SENSATION: Light touch: WFL   MUSCLE LENGTH:  Passive from 90/90 position in supine Hamstrings: Right -30 deg; Left -40 deg   POSTURE: rounded shoulders, forward head, posterior pelvic tilt, and chooses to sit with posterior pelvic tilt, leaned back against wall.  LOWER EXTREMITY ROM:   Tightness in hamstrings, tenderness to palpation along superior aspect of IT band, especially greater trochanter; tightness at hip flexors in sidelying  LOWER EXTREMITY MMT:    MMT Right Eval Left Eval  Hip flexion 4+ 4  Hip extension    Hip abduction 3+ 4  Hip adduction 4 4  Hip internal rotation    Hip external rotation    Knee flexion 4 3+  Knee extension 4 4  Ankle dorsiflexion 3+ 3+  Ankle plantarflexion    Ankle inversion    Ankle eversion    (Blank rows = not tested)  BED MOBILITY:  Independent, extra time  TRANSFERS: Assistive device utilized: None  Sit to stand: Modified independence Stand to sit: Modified independence  STAIRS: Level of Assistance: SBA Stair Negotiation Technique: Alternating Pattern  with Bilateral Rails  Number of Stairs: 2-3  Height of Stairs: 4"-6"  Comments: Decreased foot clearance  GAIT: Gait pattern:  veers to R with gait challenges, step through pattern, decreased ankle dorsiflexion- Right, decreased ankle dorsiflexion- Left, genu recurvatum- Left, wide BOS, poor foot clearance- Right, and poor foot clearance- Left Distance walked: 50 ft x 4 Assistive device utilized: None Level of assistance: SBA Comments: veers to R with balance  challenges  FUNCTIONAL TESTS:  10 meter walk test: 11.78 sec (2.78 ft/sec) self-selected; FAST:  8.47 sec = 3.87 ft/sec Functional gait assessment: 14/30 (scores <22/30 indicate increased fall risk); score has decreased from 17/30 at initial eval in hospital    PATIENT SURVEYS:  NA   M-CTSIB  Condition 1: Firm Surface, EO 30 Sec, Normal Sway  Condition 2: Firm Surface, EC 30 Sec, Mild Sway  Condition 3: Foam Surface, EO 30 Sec, Mild Sway  Condition 4: Foam Surface, EC 30 Sec, Mild Sway     GOALS: Goals reviewed with patient? Yes  SHORT TERM GOALS: Target date: 10/08/2022  Pt will be supervision with HEP for improved flexibility, strength, balance, and gait. Baseline: Goal status: MET, 10/13/2022  2.  Pt will report pain in hips at night <5/10, for improved sleep, bed mobility positioning. Baseline: 10/13/22:  rates previous night as 8/10, previous night to that was 0/10  Goal status: GOAL PARTIALLY MET, 10/13/22   LONG TERM GOALS: Target date: 11/05/2022  Pt will be independent with HEP for improved balance, flexibility, strength, gait. Baseline:  Goal status: MET  2.  Pt will improve FGA score to at least 20/30 to decrease fall risk. Baseline: 14/30; 28/30  Goal status: MET  3.  Pt will improve gait velocity to at least 3 ft/sec self-selected speed. Baseline: 2.78 ft/sec; 3.6 ft/sec Goal status: MET  4.  Pt will ambulate at least 1000 ft, indoor and outdoor surfaces, modified independently for return to outdoor gait hiking. Baseline:  Goal status: MET    ASSESSMENT:  CLINICAL IMPRESSION: Pt demo all goals met other than experiencing episodic back and bilateral hip pain. Performance of various hip resistance movements and positions did not elicit pain and minimal tenderness to palpatoin of greater trochanter.  Initiated course of repeated extensions to differentiate lumbar origin. Advised pt and spouse at length regarding repeated extensions and monitoring for  centralization and if this fails to centralize to trial flexion-bias.  Pt reports ability to resume typical physical activities.  Will D/C from this venue of PT.   OBJECTIVE IMPAIRMENTS: Abnormal gait, decreased balance, decreased knowledge of use of DME, decreased mobility, difficulty walking, decreased ROM, decreased strength, impaired flexibility, and pain.   ACTIVITY LIMITATIONS: standing, sleeping, transfers, bed mobility, and locomotion level  PARTICIPATION LIMITATIONS: community activity and fishing, hiking  PERSONAL FACTORS: 3+ comorbidities: see above  are also affecting patient's functional outcome.   REHAB POTENTIAL: Good  CLINICAL DECISION MAKING: Evolving/moderate complexity  EVALUATION COMPLEXITY: Moderate  PLAN:  PT FREQUENCY: 2x/week  PT DURATION: 6 weeks (including eval week)  PLANNED INTERVENTIONS: Therapeutic exercises, Therapeutic activity, Neuromuscular re-education, Balance training, Gait training, Patient/Family education, Self Care, Joint mobilization, Stair training, DME instructions, Cryotherapy, and Manual therapy  PLAN FOR NEXT SESSION: D/C to HEP  3:03 PM, 11/03/22 M. Sherlyn Lees, PT, DPT Physical Therapist- [place] Office Number: [phone]

## 2022-11-03 NOTE — Patient Instructions (Addendum)
ALWAYS FEEL FREE TO CALL OUR OFFICE WITH ANY PROBLEMS OR QUESTIONS [phone])  **PLEASE NOTE** ALL MEDICATION REFILL REQUESTS (INCLUDING CONTROLLED SUBSTANCES) NEED TO BE MADE AT LEAST 7 DAYS PRIOR TO REFILL BEING DUE. ANY REFILL REQUESTS INSIDE THAT TIME FRAME MAY RESULT IN DELAYS IN RECEIVING YOUR PRESCRIPTION.    RETURN TO DRIVING PLAN:  WITH THE SUPERVISION OF A LICENSED DRIVER, PLEASE DRIVE IN AN EMPTY PARKING LOT FOR AT LEAST 2-3 TRIALS TO TEST REACTION TIME, VISION, USE OF EQUIPMENT IN CAR, ETC.  IF SUCCESSFUL WITH THE PARKING LOT DRIVING, PROCEED TO SUPERVISED DRIVING TRIALS IN YOUR NEIGHBORHOOD STREETS AT LOW TRAFFIC TIMES TO TEST OBSERVATION TO TRAFFIC SIGNALS, REACTION TIME, ETC. PLEASE ATTEMPT AT LEAST 2-3 TRIALS IN YOUR NEIGHBORHOOD.  IF NEIGHBORHOOD DRIVING IS SUCCESSFUL, YOU MAY PROCEED TO DRIVING IN BUSIER AREAS IN YOUR COMMUNITY WITH SUPERVISION OF A LICENSED DRIVER.        YOU SHOULD BE STRETCHING AT LEAST ONCE PER DAY.  PREFERRABLY 2-3X PER DAY.   ADD IN SOME LIGHT RESISTANCE WORK AND AEROBIC ACTIVITY (WALKING)  YOU MAY USE ICE FOR HIP PAIN AS WELL AS VOLTAREN GEL. YOU CAN TRY HEAT PRIOR TO STRETCHING.

## 2022-11-03 NOTE — Progress Notes (Signed)
Subjective:    Patient ID: Douglas Cox, male    DOB: 11/04/1950, 72 y.o.   MRN: [id number]  HPI  Mr. Donais is here in follow-up of his bilateral basal ganglia infarcts.  I last saw him in January.  He has been busy with physical therapy. He has been seeing all 3 disciplines. There was intermittent dizzines which responded to basic vestibular treatments.  Overall therapy has been working on his balance and gait mechanics.  Overall his strength is improving.  He and his wife are concerned that he is lost some further weight over the past few months despite trying to eat much better.  He is unable to tolerate most supplements due to his celiac disease.  Another issue he is having is bilateral hip pain.  Apparently has seen orthopedics in the past for assessment.  He has some stretches from therapy as well but he is not doing these routinely.  His wife states that he is not doing a lot from a exercise and strengthening standpoint on his own at home.  Concern has been an area along his anal region.  They had concerns that he might have a lipoma.  The area is tender and it can be painful when he has bowel movements as well.  He does have an external hemorrhoid.  His family doctor placed him on some antibiotics for this tender area but he really has not seen a difference since being on them.  He is walking without a device.  He has interest in driving again.     Pain Inventory Average Pain 9 Pain Right Now 1 My pain is sharp and stabbing  LOCATION OF PAIN  hip  BOWEL Number of stools per week: 2 Oral laxative use Yes  Type of laxative Linzess, Benefiber Enema or suppository use No  History of colostomy No  Incontinent No   BLADDER Normal In and out cath, frequency n/a Able to self cath n/a Bladder incontinence No Frequent urination Yes  Leakage with coughing No  Difficulty starting stream No  Incomplete bladder emptying No    Mobility walk without assistance how many  minutes can you walk? 60 ability to climb steps?  yes do you drive?  no Do you have any goals in this area?  yes  Function retired  Neuro/Psych weakness anxiety  Prior Studies CT/MRI  Physicians involved in your care Any changes since last visit?  no   No family history on file. Social History   Socioeconomic History   Marital status: Married    Spouse name: Not on file   Number of children: Not on file   Years of education: Not on file   Highest education level: Not on file  Occupational History   Not on file  Tobacco Use   Smoking status: Former    Types: Cigarettes    Quit date: 01/30/2006    Years since quitting: 16.7   Smokeless tobacco: Never  Vaping Use   Vaping Use: Never used  Substance and Sexual Activity   Alcohol use: Not Currently    Alcohol/week: 4.0 - 5.0 standard drinks of alcohol    Types: 4 - 5 Shots of liquor per week    Comment: 3 glasses of whiskey per night.   Drug use: Not Currently    Frequency: 7.0 times per week    Types: Marijuana   Sexual activity: Not Currently  Other Topics Concern   Not on file  Social History Narrative   Not  on file   Social Determinants of Health   Financial Resource Strain: Not on file  Food Insecurity: No Food Insecurity (07/21/2022)   Hunger Vital Sign    Worried About Running Out of Food in the Last Year: Never true    Ran Out of Food in the Last Year: Never true  Transportation Needs: No Transportation Needs (07/21/2022)   PRAPARE - Hydrologist (Medical): No    Lack of Transportation (Non-Medical): No  Physical Activity: Not on file  Stress: Not on file  Social Connections: Not on file   Past Surgical History:  Procedure Laterality Date   CATARACT EXTRACTION Bilateral    Dr. Valetta Close   EYE SURGERY     FRACTURE SURGERY     GAS INSERTION Right 05/21/2021   Procedure: INSERTION OF GAS - C3F8;  Surgeon: Bernarda Caffey, MD;  Location: [place];  Service: Ophthalmology;   Laterality: Right;   GAS/FLUID EXCHANGE Right 05/21/2021   Procedure: GAS/FLUID EXCHANGE;  Surgeon: Bernarda Caffey, MD;  Location: [place];  Service: Ophthalmology;  Laterality: Right;   HERNIA REPAIR     IR CT HEAD LTD  07/17/2022   IR PERCUTANEOUS ART THROMBECTOMY/INFUSION INTRACRANIAL INC DIAG ANGIO  07/17/2022   IR US GUIDE VASC ACCESS RIGHT  07/17/2022   PARS PLANA VITRECTOMY Right 05/21/2021   Procedure: PARS PLANA VITRECTOMY WITH 25 GAUGE;  Surgeon: Bernarda Caffey, MD;  Location: [place];  Service: Ophthalmology;  Laterality: Right;   PERFLUORONE INJECTION Right 05/21/2021   Procedure: PERFLUORONE INJECTION;  Surgeon: Bernarda Caffey, MD;  Location: [place];  Service: Ophthalmology;  Laterality: Right;   PHOTOCOAGULATION WITH LASER Right 05/21/2021   Procedure: PHOTOCOAGULATION WITH LASER;  Surgeon: Bernarda Caffey, MD;  Location: [place];  Service: Ophthalmology;  Laterality: Right;   RADIOLOGY WITH ANESTHESIA N/A 07/17/2022   Procedure: IR WITH ANESTHESIA;  Surgeon: Radiologist, Medication, MD;  Location: [place];  Service: Radiology;  Laterality: N/A;   RETINAL DETACHMENT SURGERY     Past Medical History:  Diagnosis Date   Celiac disease    Retinal detachment    Stroke ([place])    There were no vitals taken for this visit.  Opioid Risk Score:   Fall Risk Score:  `1  Depression screen [facility] 2/9     09/01/2022   11:43 AM  Depression screen PHQ 2/9  Decreased Interest 0  Down, Depressed, Hopeless 0  PHQ - 2 Score 0  Altered sleeping 1  Tired, decreased energy 0  Change in appetite 0  Feeling bad or failure about yourself  1  Trouble concentrating 1  Moving slowly or fidgety/restless 0  PHQ-9 Score 3     Review of Systems  Musculoskeletal:        B/L hip  All other systems reviewed and are negative.      Objective:   Physical Exam  General: No acute distress.  It does appear that he has lost some weight. HEENT: NCAT, EOMI, oral membranes moist Cards: reg rate  Chest: normal  effort Abdomen: Soft, NT, ND Skin: There is a firm, tender mass measuring approximately 2 to 3 cm x 5 to 6 cm just lateral to his anus.  Hemorrhoid is visible there.  There is no drainage or discoloration.  Right groin area is intact. Extremities: no edema Psych: pleasant and appropriate  Neuro: alert.  Engages much more quickly but still needs some cueing at times..  Improve insight and awareness.  Was oriented to  date and place as well as reason.  Short-term memory generally tact.  Strength is 4 out of 5 in the upper extremities without gross ataxia.  Lower extremity strength grossly  4+ out of 5.  No focal sensory loss appreciated.    Gait is much improved.  He has improved stride length and appropriate width.  He is a bit slower when changing directions however.   Musculoskeletal: He has pain with palpation of both greater trochanters.  Cross leg maneuvers did not increase pain.  Flexing at the waist did not increase pain either.             Medical Problem List and Plan: 1. 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               -continue outpatient therapies.    -he has made further progress from a functional and cognitive standpoint.  -we discussed limited driving trials today. Paperwork provided. 2.  Atrial fibrillation: eliquis per cards  -cardiology follow-up pending 3. Bilateral greater troch bursitis:  -ice, stretching, heat before stretching described  -voltaren gel 4.  Affect/sleep: Mostly driven by stroke.  Seems better          -have discussed sleep hygiene 5. Right groin: likely postprocedural scar tissue--INR following 7. Left perianal cyst/mass: made referral to [facility] for assessment.  -area does not appear infected, it's not draining  8.  Nutrition: encouraged increased protein and fat.   -discussed importance of regular exercise as well.  9.  Hx  of COVID:   10.  Perianal cyst 11. HTN. Controlled off meds             -    30 minutes of face to face patient care time were spent during this visit. All questions were encouraged and answered. Follow up with me in 3 mos.

## 2022-11-04 ENCOUNTER — Other Ambulatory Visit: Payer: Self-pay | Admitting: Adult Health

## 2022-11-04 ENCOUNTER — Ambulatory Visit: Payer: Medicare HMO | Admitting: Physical Therapy

## 2022-11-04 ENCOUNTER — Encounter: Payer: Medicare HMO | Admitting: Occupational Therapy

## 2022-11-04 DIAGNOSIS — K6289 Other specified diseases of anus and rectum: Secondary | ICD-10-CM

## 2022-11-05 ENCOUNTER — Other Ambulatory Visit: Payer: Self-pay

## 2022-11-09 ENCOUNTER — Other Ambulatory Visit: Payer: Self-pay

## 2022-11-09 ENCOUNTER — Other Ambulatory Visit (HOSPITAL_COMMUNITY): Payer: Self-pay

## 2022-11-10 ENCOUNTER — Other Ambulatory Visit: Payer: Self-pay

## 2022-11-10 ENCOUNTER — Encounter: Payer: Self-pay | Admitting: Pharmacist

## 2022-11-11 ENCOUNTER — Other Ambulatory Visit (HOSPITAL_COMMUNITY): Payer: Self-pay

## 2022-11-12 ENCOUNTER — Other Ambulatory Visit: Payer: Self-pay | Admitting: Physical Medicine & Rehabilitation

## 2022-11-12 ENCOUNTER — Other Ambulatory Visit: Payer: Self-pay

## 2022-11-12 ENCOUNTER — Other Ambulatory Visit (HOSPITAL_COMMUNITY): Payer: Self-pay

## 2022-11-12 MED ORDER — APIXABAN 5 MG PO TABS
5.0000 mg | ORAL_TABLET | Freq: Two times a day (BID) | ORAL | 3 refills | Status: DC
  Filled 2022-11-12 – 2023-01-01 (×3): qty 120, 60d supply, fill #0
  Filled 2023-01-25 – 2023-03-02 (×2): qty 120, 60d supply, fill #1
  Filled 2023-03-24: qty 60, 30d supply, fill #1
  Filled 2023-04-18 – 2023-04-25 (×2): qty 60, 30d supply, fill #2
  Filled 2023-05-20: qty 60, 30d supply, fill #3
  Filled 2023-06-19: qty 60, 30d supply, fill #4
  Filled 2023-08-05 (×2): qty 60, 30d supply, fill #5
  Filled 2023-08-31 – 2023-09-08 (×4): qty 60, 30d supply, fill #6

## 2022-11-13 ENCOUNTER — Other Ambulatory Visit (HOSPITAL_COMMUNITY): Payer: Self-pay

## 2022-11-15 ENCOUNTER — Encounter: Payer: Self-pay | Admitting: Adult Health

## 2022-11-17 ENCOUNTER — Other Ambulatory Visit: Payer: Medicare HMO

## 2022-11-17 ENCOUNTER — Ambulatory Visit
Admission: RE | Admit: 2022-11-17 | Discharge: 2022-11-17 | Disposition: A | Discharge status: Home / Self Care | Payer: Medicare HMO | Source: Ambulatory Visit | Attending: Adult Health | Admitting: Adult Health

## 2022-11-17 DIAGNOSIS — K6289 Other specified diseases of anus and rectum: Secondary | ICD-10-CM

## 2022-11-17 MED ORDER — IOPAMIDOL (ISOVUE-300) INJECTION 61%
100.0000 mL | Freq: Once | INTRAVENOUS | Status: AC | PRN
  Administered 2022-11-17: 100 mL via INTRAVENOUS

## 2022-12-08 ENCOUNTER — Other Ambulatory Visit: Payer: Medicare HMO

## 2022-12-09 ENCOUNTER — Other Ambulatory Visit: Payer: Medicare HMO

## 2022-12-09 DIAGNOSIS — Z638 Other specified problems related to primary support group: Secondary | ICD-10-CM | POA: Insufficient documentation

## 2022-12-10 ENCOUNTER — Telehealth: Payer: Self-pay | Admitting: *Deleted

## 2022-12-10 NOTE — Telephone Encounter (Signed)
Douglas Cox has appt 01/19/23 with Dr Riley Kill. HIs wife has questions in the meantime about his driving --he has already started driving but she is asking what you recommend -is there any coaching available or diriver education or what kinds of things you look for to see if the patient is ready to begin driving , what concerns there might be as far as judgement of distance , speed, reaction time, reflexes-- that kind of thing. Please call her.

## 2022-12-13 ENCOUNTER — Encounter: Payer: Self-pay | Admitting: *Deleted

## 2022-12-13 NOTE — Telephone Encounter (Signed)
Notified wife and sent information through Mychart.

## 2023-01-01 ENCOUNTER — Other Ambulatory Visit (HOSPITAL_COMMUNITY): Payer: Self-pay

## 2023-01-03 ENCOUNTER — Other Ambulatory Visit: Payer: Self-pay

## 2023-01-11 ENCOUNTER — Telehealth: Payer: Self-pay | Admitting: Gastroenterology

## 2023-01-11 NOTE — Telephone Encounter (Signed)
Douglas Cox was informed that we can do the [facility] visit. She is concerned that his co pay's are adding up.

## 2023-01-11 NOTE — Progress Notes (Signed)
Chief complaint: Abnormal PET/CT, mediastinal mass, esophageal thickening Chief Complaint  Patient presents with   Hiccups   This service was provided via  telemedicine   I connected withNAME@ on 01/21/23 at 10:40 AM EDT by a video enabled telemedicine application and verified that I am speaking with the correct person using two identifiers.  Patient location: Home Provider location: Office   I discussed the limitations, risks, security and privacy concerns of performing an evaluation and management service by video enabled telemedicine application and the availability of in person appointments. I also discussed with the patient that there may be a patient responsible charge related to this service. The patient expressed understanding and agreed to proceed.   The persons participating in this telemedicine service were myself and the patient  Interactive audio and video telecommunications were attempted between this provider and patient, however failed, due to patient having technical difficulties .We continued and completed visit with audio only.  HPI:  72 year old very pleasant gentleman with history of celiac disease and abnormal findings on CT here to discuss EGD for follow-up. 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  He is also diagnosed with A-fib and is currently on Eliquis  I last saw him on 10-06-22. At that time he reported that his previous doctor had done an ultrasound indicating a benign esophageal wall thickening. He was also experiencing hiccups, and mild constipation. He was taking 2 tablespoons of Fibercon powder once a day and stated that Miralax was too strong for him. He's on Eliquis.    Today, he complains of hiccups that occurs every other day for the past 6 months . He states that his hiccups started prior to his stroke.He denies any associated taste, reflux, chest pain, or abdominal pain.  He also denies taking any medication to  relief his symptoms. He reports typically having a BM once daily or once every other day with stool softener. He denies taking Linzess but reports taking Famotidine 20 mg and Crestor 20 mg. He also report compliance and good tolerance of Eliquis.   He denies diarrhea, constipation, nausea, blood in stool, black stool, vomiting, abdominal pain, bloating, unintentional weight loss, reflux, dysphagia.  GI Hx  ECHO 07/18/2022 1.Left ventricular ejection fraction, by estimation, is 60 to 65%. The left ventricle has normal function. Left ventricular endocardial border not optimally defined to evaluate regional wall motion. Left ventricular diastolic function could not be evaluated.  2. Right ventricular systolic function is normal. The right ventricular size is normal. Tricuspid regurgitation signal is inadequate for assessing PA pressure. 3. The mitral valve was not well visualized. No evidence of mitral valve regurgitation.  4.The aortic valve was not well visualized. Aortic valve regurgitation is not visualized. No aortic stenosis is present.  5. Limited study stopped early by patient  He has 2.8 X1.9X 6.5 cm anterior mediastinal lesion, likely complex cyst based on PET/CT 04/24/21.   He had mild diffuse thickening of esophagus on CT angio chest abdomen pelvis in August 2022 but subsequent imaging in September were negative for any mass lesion or esophageal thickening.  No hiatal hernia and ingested barium tablet passed freely through the esophagus.     Patient moved here 2 to 3 years ago from [facility] EGD and colonoscopy in [place] 5-6 years ago were unremarkable according to patient, reports not available to review during this visit   Current Outpatient Medications:    apixaban (ELIQUIS) 5 MG TABS tablet, Take 1 tablet (5 mg total)  by mouth 2 (two) times daily., Disp: 120 tablet, Rfl: 3   cyanocobalamin (VITAMIN B12) 1000 MCG/ML injection, Inject into the muscle., Disp: , Rfl:    diclofenac  Sodium (VOLTAREN) 1 % GEL, Apply 1 a small amount to affected area twice a day as needed for pain, Disp: , Rfl:    famotidine (PEPCID) 20 MG tablet, Take by mouth., Disp: , Rfl:    linaclotide (LINZESS) 72 MCG capsule, Take 1 capsule (72 mcg total) by mouth daily before breakfast., Disp: 30 capsule, Rfl: 2   magnesium oxide (MAG-OX) 400 (240 Mg) MG tablet, Take 0.5 tablets (200 mg total) by mouth at bedtime., Disp: 30 tablet, Rfl: 5   pantoprazole (PROTONIX) 40 MG tablet, Take 1 tablet (40 mg total) by mouth once daily each morning., Disp: 90 tablet, Rfl: 3   Probiotic Product (ALIGN) 4 MG CAPS, , Disp: , Rfl:    rosuvastatin (CRESTOR) 20 MG tablet, Take 1 tablet (20 mg total) by mouth daily., Disp: 30 tablet, Rfl: 3   SYRINGE-NEEDLE, DISP, 3 ML (B-D 3CC LUER-LOK SYR 25GX1") 25G X 1" 3 ML MISC, Use as directed for B12 injections every 2 weeks, Disp: 30 each, Rfl: 0   tiZANidine (ZANAFLEX) 4 MG tablet, Take 1 tablet (4 mg total) by mouth every 8 (eight) hours as needed., Disp: 40 tablet, Rfl: 0   traMADol (ULTRAM) 50 MG tablet, Take 1 tablet (50 mg total) by mouth every 6 (six) hours as needed for pain, Disp: 20 tablet, Rfl: 0   vitamin D3 (CHOLECALCIFEROL) 25 MCG tablet, Take 1 tablet (1,000 Units total) by mouth daily., Disp: 30 tablet, Rfl: 5    Allergies as of 01/19/2023 - Review Complete 01/19/2023  Allergen Reaction Noted   Nitrous oxide Other (See Comments) 05/21/2021   Codone [hydrocodone] Hives 01/30/2018   Tylenol [acetaminophen] Hives 07/08/2021   Hydrocodone-acetaminophen  08/31/2022   Oxycodone hcl  08/31/2022   Clarithromycin Anxiety 01/30/2018   Gluten meal Other (See Comments) 07/19/2022   Lactose intolerance (gi) Other (See Comments) 07/19/2022    Past Medical History:  Diagnosis Date   Celiac disease    Retinal detachment    Stroke [facility])     Past Surgical History:  Procedure Laterality Date   CATARACT EXTRACTION Bilateral    Dr. Cathey Endow   EYE SURGERY     FRACTURE  SURGERY     GAS INSERTION Right 05/21/2021   Procedure: INSERTION OF GAS - C3F8;  Surgeon: Rennis Chris, MD;  Location: [facility] OR;  Service: Ophthalmology;  Laterality: Right;   GAS/FLUID EXCHANGE Right 05/21/2021   Procedure: GAS/FLUID EXCHANGE;  Surgeon: Rennis Chris, MD;  Location: [facility] OR;  Service: Ophthalmology;  Laterality: Right;   HERNIA REPAIR     IR CT HEAD LTD  07/17/2022   IR PERCUTANEOUS ART THROMBECTOMY/INFUSION INTRACRANIAL INC DIAG ANGIO  07/17/2022   IR US GUIDE VASC ACCESS RIGHT  07/17/2022   PARS PLANA VITRECTOMY Right 05/21/2021   Procedure: PARS PLANA VITRECTOMY WITH 25 GAUGE;  Surgeon: Rennis Chris, MD;  Location: [facility] OR;  Service: Ophthalmology;  Laterality: Right;   PERFLUORONE INJECTION Right 05/21/2021   Procedure: PERFLUORONE INJECTION;  Surgeon: Rennis Chris, MD;  [facility] OR;  Service: Ophthalmology;  Laterality: Right;   PHOTOCOAGULATION WITH LASER Right 05/21/2021   Procedure: PHOTOCOAGULATION WITH LASER;  Surgeon: Rennis Chris, MD;  Location: [facility] OR;  Service: Ophthalmology;  Laterality: Right;   RADIOLOGY WITH ANESTHESIA N/A 07/17/2022   Procedure: IR WITH ANESTHESIA;  Surgeon: Radiologist, Medication,  MD;  Location: [facility] OR;  Service: Radiology;  Laterality: N/A;   RETINAL DETACHMENT SURGERY      History reviewed. No pertinent family history.  Social History   Socioeconomic History   Marital status: Married    Spouse name: Not on file   Number of children: Not on file   Years of education: Not on file   Highest education level: Not on file  Occupational History   Not on file  Tobacco Use   Smoking status: Former    Types: Cigarettes    Quit date: 01/30/2006    Years since quitting: 16.9   Smokeless tobacco: Never  Vaping Use   Vaping Use: Never used  Substance and Sexual Activity   Alcohol use: Not Currently    Alcohol/week: 4.0 - 5.0 standard drinks of alcohol    Types: 4 - 5 Shots of liquor per week    Comment: 3 glasses of whiskey per  night.   Drug use: Not Currently    Frequency: 7.0 times per week    Types: Marijuana   Sexual activity: Not Currently  Other Topics Concern   Not on file  Social History Narrative   Not on file   Social Determinants of Health   Financial Resource Strain: Not on file  Food Insecurity: No Food Insecurity (07/21/2022)   Hunger Vital Sign    Worried About Running Out of Food in the Last Year: Never true    Ran Out of Food in the Last Year: Never true  Transportation Needs: No Transportation Needs (07/21/2022)   PRAPARE - Administrator, Civil Service (Medical): No    Lack of Transportation (Non-Medical): No  Physical Activity: Not on file  Stress: Not on file  Social Connections: Not on file  Intimate Partner Violence: Not At Risk (07/21/2022)   Humiliation, Afraid, Rape, and Kick questionnaire    Fear of Current or Ex-Partner: No    Emotionally Abused: No    Physically Abused: No    Sexually Abused: No      Review of systems: Review of Systems  HENT:  Negative for trouble swallowing.   Gastrointestinal:  Negative for abdominal distention, abdominal pain, anal bleeding, blood in stool, constipation, diarrhea, nausea, rectal pain and vomiting.       +hiccups     Physical Exam: There were no vitals taken for this visit.  General: well-appearing   Eyes: sclera anicteric, no redness ENT: oral mucosa moist without lesions, no cervical or supraclavicular lymphadenopathy    Data Reviewed:  Reviewed labs, radiology imaging, old records and pertinent past GI work up   Assessment and Plan/Recommendations:  72 year old very pleasant gentleman with history of celiac disease,  abnormal CT with mediastinal mass and esophageal wall thickening  Esophageal wall thickening most likely secondary to esophagitis in the setting of controlled acid reflux Will need EGD to exclude neoplastic lesion or Barrett's esophagus. History of CVA, on Eliquis for A-fib, will obtain  clearance from prescribing provider to hold Eliquis 2 days prior to the procedure  He is due for surveillance colonoscopy, will schedule colonoscopy along with EGD The risks and benefits as well as alternatives of endoscopic procedure(s) have been discussed and reviewed. All questions answered. The patient agrees to proceed.  Chronic idiopathic constipation: Increase dietary fiber and water intake Start Benefiber 1 tablespoon 3 times daily with meals   The patient was provided an opportunity to ask questions and all were answered. The patient agreed with the plan  and demonstrated an understanding of the instructions.  I,Safa M Kadhim,acting as a scribe for Marsa Aris, MD.,have documented all relevant documentation on the behalf of Marsa Aris, MD,as directed by  Marsa Aris, MD while in the presence of Marsa Aris, MD.   I, Marsa Aris, MD, have reviewed all documentation for this visit. The documentation on 01/19/23 for the exam, diagnosis, procedures, and orders are all accurate and complete.   Iona Beard , MD    CC: Combs, Prince Solian, *

## 2023-01-11 NOTE — Telephone Encounter (Signed)
Ok its fine

## 2023-01-11 NOTE — Telephone Encounter (Signed)
Dr Lavon Paganini are you ok with me changing his appointment to a  Virtual visit?

## 2023-01-11 NOTE — Telephone Encounter (Signed)
Inbound call from patient's wife requesting patient's 6/19 appointment to be changed to a virtual visit. Requesting a call back to discuss this further. Please advise, thank you.

## 2023-01-12 ENCOUNTER — Encounter: Payer: Self-pay | Admitting: Physical Medicine & Rehabilitation

## 2023-01-12 ENCOUNTER — Encounter: Payer: Medicare HMO | Attending: Physical Medicine & Rehabilitation | Admitting: Physical Medicine & Rehabilitation

## 2023-01-12 VITALS — BP 137/78 | HR 45 | Ht 70.0 in | Wt 141.8 lb

## 2023-01-12 DIAGNOSIS — I6381 Other cerebral infarction due to occlusion or stenosis of small artery: Secondary | ICD-10-CM | POA: Diagnosis present

## 2023-01-12 DIAGNOSIS — E43 Unspecified severe protein-calorie malnutrition: Secondary | ICD-10-CM | POA: Insufficient documentation

## 2023-01-12 NOTE — Progress Notes (Signed)
Subjective:    Patient ID: Douglas Cox, male    DOB: 1951-08-02, 72 y.o.   MRN: [id number]  HPI  Douglas Cox is here in follow up of his BG infarcts. He has developed a frozen shoulder which ortho has been following. He has had a scan and 2 injections without relief. Therapy starts tomorrow.  He thinks he may have injured the shoulder initially when he had to reach back behind his head.  It is not entirely clear however.  The symptoms came on fairly quickly thereafter.  He has used an occasional tramadol.  He also has tizanidine as needed.  He is no longer using gabapentin.   He is constipated moving bowels every 3 days.  He has been hesitant to use a daily laxative and fears of having diarrhea.  His wife is giving him a lot of fiber in his diet.  From a dietary standpoint his weight has remained fairly stable.  He is trying to push his intake and especially that of protein.  They looked into driving rehab and are interested.  Cost is around 700 bucks which does give them some pause.  His vision has been checked and he is clear from that standpoint.  Overall balance and coordination is improving.  Wife does note that he can shuffle his feet early in the morning or when he is fatigued later in the day.  Pain Inventory Average Pain 6 Pain Right Now 8 My pain is sharp and stabbing  LOCATION OF PAIN  shoulder  BOWEL Number of stools per week: 2 Oral laxative use Yes  Type of laxative fibercon Enema or suppository use No  History of colostomy No  Incontinent No   BLADDER Normal In and out cath, frequency . Able to self cath  . Bladder incontinence No  Frequent urination No  Leakage with coughing No  Difficulty starting stream No  Incomplete bladder emptying No    Mobility walk without assistance how many minutes can you walk? unlimited ability to climb steps?  yes do you drive?  no  Function retired  Neuro/Psych No problems in this area  Prior Studies Any  changes since last visit?  no  Physicians involved in your care Any changes since last visit?  no   No family history on file. Social History   Socioeconomic History   Marital status: Married    Spouse name: Not on file   Number of children: Not on file   Years of education: Not on file   Highest education level: Not on file  Occupational History   Not on file  Tobacco Use   Smoking status: Former    Types: Cigarettes    Quit date: 01/30/2006    Years since quitting: 16.9   Smokeless tobacco: Never  Vaping Use   Vaping Use: Never used  Substance and Sexual Activity   Alcohol use: Not Currently    Alcohol/week: 4.0 - 5.0 standard drinks of alcohol    Types: 4 - 5 Shots of liquor per week    Comment: 3 glasses of whiskey per night.   Drug use: Not Currently    Frequency: 7.0 times per week    Types: Marijuana   Sexual activity: Not Currently  Other Topics Concern   Not on file  Social History Narrative   Not on file   Social Determinants of Health   Financial Resource Strain: Not on file  Food Insecurity: No Food Insecurity (07/21/2022)   Hunger Vital Sign  Worried About Programme researcher, broadcasting/film/video in the Last Year: Never true    Ran Out of Food in the Last Year: Never true  Transportation Needs: No Transportation Needs (07/21/2022)   PRAPARE - Administrator, Civil Service (Medical): No    Lack of Transportation (Non-Medical): No  Physical Activity: Not on file  Stress: Not on file  Social Connections: Not on file   Past Surgical History:  Procedure Laterality Date   CATARACT EXTRACTION Bilateral    Dr. Cathey Endow   EYE SURGERY     FRACTURE SURGERY     GAS INSERTION Right 05/21/2021   Procedure: INSERTION OF GAS - C3F8;  Surgeon: Rennis Chris, MD;  Location: [facility] OR;  Service: Ophthalmology;  Laterality: Right;   GAS/FLUID EXCHANGE Right 05/21/2021   Procedure: GAS/FLUID EXCHANGE;  Surgeon: Rennis Chris, MD;  Location: [facility] OR;  Service: Ophthalmology;   Laterality: Right;   HERNIA REPAIR     IR CT HEAD LTD  07/17/2022   IR PERCUTANEOUS ART THROMBECTOMY/INFUSION INTRACRANIAL INC DIAG ANGIO  07/17/2022   IR US GUIDE VASC ACCESS RIGHT  07/17/2022   PARS PLANA VITRECTOMY Right 05/21/2021   Procedure: PARS PLANA VITRECTOMY WITH 25 GAUGE;  Surgeon: Rennis Chris, MD;  Location: [facility] OR;  Service: Ophthalmology;  Laterality: Right;   PERFLUORONE INJECTION Right 05/21/2021   Procedure: PERFLUORONE INJECTION;  Surgeon: Rennis Chris, MD;  Location: [facility] OR;  Service: Ophthalmology;  Laterality: Right;   PHOTOCOAGULATION WITH LASER Right 05/21/2021   Procedure: PHOTOCOAGULATION WITH LASER;  Surgeon: Rennis Chris, MD;  Location: [facility] OR;  Service: Ophthalmology;  Laterality: Right;   RADIOLOGY WITH ANESTHESIA N/A 07/17/2022   Procedure: IR WITH ANESTHESIA;  Surgeon: Radiologist, Medication, MD;  Location: [facility] OR;  Service: Radiology;  Laterality: N/A;   RETINAL DETACHMENT SURGERY     Past Medical History:  Diagnosis Date   Celiac disease    Retinal detachment    Stroke (HCC)    BP 137/78   Pulse (!) 45   Ht 5\' 10"  (1.778 m)   Wt 141 lb 12.8 oz (64.3 kg)   SpO2 97%   BMI 20.35 kg/m   Opioid Risk Score:   Fall Risk Score:  `1  Depression screen [facility] 2/9     09/01/2022   11:43 AM  Depression screen PHQ 2/9  Decreased Interest 0  Down, Depressed, Hopeless 0  PHQ - 2 Score 0  Altered sleeping 1  Tired, decreased energy 0  Change in appetite 0  Feeling bad or failure about yourself  1  Trouble concentrating 1  Moving slowly or fidgety/restless 0  PHQ-9 Score 3     Review of Systems  Musculoskeletal:        Shoulder pain  All other systems reviewed and are negative.     Objective:   Physical Exam General: No acute distress HEENT: NCAT, EOMI, oral membranes moist Cards: reg rate  Chest: normal effort Abdomen: Soft, NT, ND Skin: dry, intact Extremities: no edema Psych: pleasant and appropriate  Neuro: alert.  Engaging,  sometimes delayed with processing.   Was oriented to date and place as well as reason.  Short-term memory generally tact.  Strength is 4 out of 5 in the upper extremities without gross ataxia.  Lower extremity strength grossly  4+ out of 5.  No focal sensory loss appreciated.    Gait IS IMPROVED, he walks on his heels a bit but it's normal length and width.   Musculoskeletal: improvedd greater  trochanters.  Cross leg maneuvers did not increase pain.  Right shoulder tender and limited with abduction and IR/ER.             Medical Problem List and Plan: 1. 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               -resume therapies for frozen shoulder.                -needs driver eval before going behind the wheel.  2.  Atrial fibrillation: eliquis per cards  -cardiology follow-up pending 3. Bilateral greater troch bursitis: improved             -ice, stretching, heat before stretching described             -voltaren gel 4.  Affect/sleep: Mostly driven by stroke.  Seems better          -have discussed sleep hygiene 5. Right groin: likely postprocedural scar tissue--INR has seen 7. Left perianal cyst/abscess---s/p draining by surgery   8.  Nutrition:               -we have discussed importance of regular exercise as well. His weight is stable today 9.  Hx of COVID:   10.  Perianal cyst 11. HTN. Controlled off meds                15 minutes of face to face patient care time were spent during this visit. All questions were encouraged and answered. Follow up with me in 6 mos.

## 2023-01-12 NOTE — Patient Instructions (Addendum)
BEFORE THERAPY, CONSIDER HEATING SHOULDER TO HELP LOOSEN THINGS UP. ALSO CONSIDER TRAMADOL OR TIZANIDINE  PRIOR TO WORKING ON SHOULDER RANGE OF MOTION.   ELBOW GREASE!!!!!   DON'T TAKE VIAGRA AFTER USING TIZANIDINE. YOUR OTHER MEDICATIONS ARE FINE TO USE.

## 2023-01-19 ENCOUNTER — Encounter: Payer: Medicare HMO | Admitting: Physical Medicine & Rehabilitation

## 2023-01-19 ENCOUNTER — Encounter: Payer: Self-pay | Admitting: Gastroenterology

## 2023-01-19 ENCOUNTER — Telehealth (INDEPENDENT_AMBULATORY_CARE_PROVIDER_SITE_OTHER): Payer: Medicare HMO | Admitting: Gastroenterology

## 2023-01-19 DIAGNOSIS — K219 Gastro-esophageal reflux disease without esophagitis: Secondary | ICD-10-CM

## 2023-01-19 DIAGNOSIS — R9389 Abnormal findings on diagnostic imaging of other specified body structures: Secondary | ICD-10-CM | POA: Diagnosis not present

## 2023-01-19 DIAGNOSIS — K229 Disease of esophagus, unspecified: Secondary | ICD-10-CM

## 2023-01-19 DIAGNOSIS — Z1211 Encounter for screening for malignant neoplasm of colon: Secondary | ICD-10-CM

## 2023-01-19 DIAGNOSIS — R066 Hiccough: Secondary | ICD-10-CM | POA: Diagnosis not present

## 2023-01-19 NOTE — Patient Instructions (Signed)
You have been scheduled for an endoscopy and colonoscopy. Please follow the written instructions given to you at your visit today. Please pick up your prep supplies at the pharmacy within the next 1-3 days. If you use inhalers (even only as needed), please bring them with you on the day of your procedure.   You will be contacted by our office prior to your procedure for directions on holding your Eliquis.  If you do not hear from our office 1 week prior to your scheduled procedure, please call [phone] to discuss.   Due to recent changes in healthcare laws, you may see the results of your imaging and laboratory studies on MyChart before your provider has had a chance to review them.  We understand that in some cases there may be results that are confusing or concerning to you. Not all laboratory results come back in the same time frame and the provider may be waiting for multiple results in order to interpret others.  Please give [place] 48 hours in order for your provider to thoroughly review all the results before contacting the office for clarification of your results.    _______________________________________________________  If your blood pressure at your visit was 140/90 or greater, please contact your primary care physician to follow up on this.  _______________________________________________________  If you are age 75 or older, your body mass index should be between 23-30. Your There is no height or weight on file to calculate BMI. If this is out of the aforementioned range listed, please consider follow up with your Primary Care Provider.  If you are age 16 or younger, your body mass index should be between 19-25. Your There is no height or weight on file to calculate BMI. If this is out of the aformentioned range listed, please consider follow up with your Primary Care Provider.   ________________________________________________________  The [facility] providers would like to  encourage you to use [facility] to communicate with providers for non-urgent requests or questions.  Due to long hold times on the telephone, sending your provider a message by [facility] may be a faster and more efficient way to get a response.  Please allow 48 business hours for a response.  Please remember that this is for non-urgent requests.  _______________________________________________________   I appreciate the  opportunity to care for you  Thank You   Marsa Aris , MD

## 2023-01-21 ENCOUNTER — Encounter: Payer: Self-pay | Admitting: Gastroenterology

## 2023-01-24 DIAGNOSIS — H5789 Other specified disorders of eye and adnexa: Secondary | ICD-10-CM | POA: Insufficient documentation

## 2023-01-24 DIAGNOSIS — N529 Male erectile dysfunction, unspecified: Secondary | ICD-10-CM | POA: Insufficient documentation

## 2023-01-25 ENCOUNTER — Other Ambulatory Visit: Payer: Self-pay | Admitting: Physical Medicine & Rehabilitation

## 2023-01-25 ENCOUNTER — Other Ambulatory Visit (HOSPITAL_COMMUNITY): Payer: Self-pay

## 2023-01-26 ENCOUNTER — Other Ambulatory Visit: Payer: Self-pay

## 2023-01-26 ENCOUNTER — Other Ambulatory Visit (HOSPITAL_COMMUNITY): Payer: Self-pay

## 2023-01-27 ENCOUNTER — Other Ambulatory Visit: Payer: Self-pay

## 2023-01-27 ENCOUNTER — Other Ambulatory Visit (HOSPITAL_COMMUNITY): Payer: Self-pay

## 2023-02-02 ENCOUNTER — Ambulatory Visit: Payer: Medicare HMO | Admitting: Physical Medicine & Rehabilitation

## 2023-02-04 ENCOUNTER — Other Ambulatory Visit (HOSPITAL_COMMUNITY): Payer: Self-pay

## 2023-02-04 ENCOUNTER — Other Ambulatory Visit: Payer: Self-pay

## 2023-02-04 MED ORDER — SILDENAFIL CITRATE 25 MG PO TABS
25.0000 mg | ORAL_TABLET | ORAL | 0 refills | Status: DC
  Filled 2023-02-04: qty 30, 30d supply, fill #0

## 2023-02-05 ENCOUNTER — Other Ambulatory Visit (HOSPITAL_COMMUNITY): Payer: Self-pay

## 2023-02-05 ENCOUNTER — Other Ambulatory Visit: Payer: Self-pay | Admitting: Physical Medicine & Rehabilitation

## 2023-02-07 ENCOUNTER — Other Ambulatory Visit (HOSPITAL_COMMUNITY): Payer: Self-pay

## 2023-02-07 ENCOUNTER — Telehealth: Payer: Self-pay | Admitting: Physical Medicine & Rehabilitation

## 2023-02-07 MED ORDER — ROSUVASTATIN CALCIUM 20 MG PO TABS
20.0000 mg | ORAL_TABLET | Freq: Every day | ORAL | 7 refills | Status: DC
  Filled 2023-02-07: qty 30, 30d supply, fill #0
  Filled 2023-03-08: qty 30, 30d supply, fill #1
  Filled 2023-04-18: qty 30, 30d supply, fill #2
  Filled 2023-05-20: qty 30, 30d supply, fill #3
  Filled 2023-06-19: qty 30, 30d supply, fill #4
  Filled 2023-08-01: qty 30, 30d supply, fill #5
  Filled 2023-08-31: qty 30, 30d supply, fill #6
  Filled 2023-10-07 – 2023-10-10 (×2): qty 30, 30d supply, fill #7

## 2023-02-07 NOTE — Telephone Encounter (Signed)
Patient requesting refill on Crestor. Is this something you are going to continue to refill?

## 2023-02-07 NOTE — Telephone Encounter (Signed)
I refilled enough to get him through this year. Going forward, he needs to go through a primary for it.  thanks

## 2023-02-07 NOTE — Telephone Encounter (Signed)
Requesting refill on Crestor. Please send to [facility] community pharmacy

## 2023-02-08 ENCOUNTER — Other Ambulatory Visit (HOSPITAL_COMMUNITY): Payer: Self-pay

## 2023-02-09 ENCOUNTER — Telehealth: Payer: Self-pay | Admitting: Physical Medicine & Rehabilitation

## 2023-02-09 ENCOUNTER — Telehealth: Payer: Self-pay

## 2023-02-09 ENCOUNTER — Telehealth: Payer: Self-pay | Admitting: Gastroenterology

## 2023-02-09 NOTE — Telephone Encounter (Signed)
Spoke with Douglas Cox re: d/c Crestor for 1 week and call office back with update. Verbalized understanding nothing further needed.

## 2023-02-09 NOTE — Telephone Encounter (Signed)
   Malakhai Beitler July 24, 1951 [id number]  Dear Dr Riley Kill:  We have scheduled the above named patient for a(n) colonoscopy & EGD procedure. Our records show that he is on anticoagulation therapy.  Please advise as to whether the patient may come off their therapy of Eliquis 2 days prior to their procedure which is scheduled for 04/05/2023.  Please route your response to Tishawn Friedhoff [place], CMA or fax response to [phone]  Sincerely,    [facility]

## 2023-02-09 NOTE — Telephone Encounter (Signed)
I spoke with Douglas Cox and wife Purnell Shoemaker to answer their questions. I told them to check with the pharmacy as there are refills on his famotidine. They will check with the pharmacy about interactions between the famotidine and ketoconazole cream. I told them at the pre-visit they will go over instructions and questions.

## 2023-02-09 NOTE — Telephone Encounter (Signed)
I recommend contacting his cardiologist Steffanie Dunn who last saw him February to get the best guidance on the safety of stopping eliquis. thx

## 2023-02-09 NOTE — Telephone Encounter (Signed)
Patients wife called with a few questions: 1. Wanting to know how long he should stop the Famotidine and Pantoprazole prior to the procedure. 2. Does the Famotidine have any interaction with ketoconazole cream 1% also wondered why they are not allow a refill on the Famotidine medication. She asked if she did not answer the call to please leave a detailed message.

## 2023-02-09 NOTE — Telephone Encounter (Signed)
Per wife patient is experiencing fatigue/tiredness/lack of energy while taking crestor for cholesterol. Please advise if patient needs to continue to take or is their an alternative. Wife would also like to know if cholesterol could be managed without taking medication and manage diet.   Please advise.

## 2023-02-11 NOTE — Telephone Encounter (Signed)
Patient with diagnosis of atrial fibrillation on Eliquis for anticoagulation.    Procedure: colonoscopy/EGD Date of procedure: 05/02/23   CHA2DS2-VASc Score = 4   This indicates a 4.8% annual risk of stroke. The patient's score is based upon: CHF History: 0 HTN History: 1 Diabetes History: 0 Stroke History: 2 Vascular Disease History: 0 Age Score: 1 Gender Score: 0   Ischemic stroke, 07/22/22  CrCl 57 Platelet count 326  Current guidelines suggest okay to hold without bridging if CVA is > 6 months ago.    Would prefer to hold Eliquis for 1 day for procedure, but can do 2 days if necessary.    **This guidance is not considered finalized until pre-operative APP has relayed final recommendations.**

## 2023-02-11 NOTE — Telephone Encounter (Signed)
   Patient Name: Douglas Cox  DOB: 1951-05-19 MRN: [id number]  Primary Cardiologist: None  Chart reviewed as part of pre-operative protocol coverage. Pre-op clearance already addressed by colleagues in earlier phone notes. To summarize recommendations:  -Would prefer to hold Eliquis for 1 day for procedure, but can do 2 days if necessary.  Please resume when medically safe to do so.  Medical clearance was not requested.  Will route this bundled recommendation to requesting provider via Epic fax function and remove from pre-op pool. Please call with questions.  Sharlene Dory, PA-C 02/11/2023, 10:36 AM

## 2023-02-12 ENCOUNTER — Other Ambulatory Visit (HOSPITAL_COMMUNITY): Payer: Self-pay

## 2023-02-16 ENCOUNTER — Other Ambulatory Visit (HOSPITAL_COMMUNITY): Payer: Self-pay

## 2023-02-16 MED ORDER — TRAMADOL HCL 50 MG PO TABS
50.0000 mg | ORAL_TABLET | Freq: Two times a day (BID) | ORAL | 0 refills | Status: DC | PRN
  Filled 2023-02-16: qty 14, 7d supply, fill #0

## 2023-02-21 NOTE — Telephone Encounter (Signed)
Per Dr Marsa Aris hold one day prior to procedure. Patient will be informed at his pre-visit on 03/29/2023.

## 2023-03-02 ENCOUNTER — Other Ambulatory Visit (HOSPITAL_COMMUNITY): Payer: Self-pay

## 2023-03-03 ENCOUNTER — Encounter: Payer: Self-pay | Admitting: Pharmacist

## 2023-03-03 ENCOUNTER — Other Ambulatory Visit: Payer: Self-pay

## 2023-03-07 DIAGNOSIS — M7501 Adhesive capsulitis of right shoulder: Secondary | ICD-10-CM | POA: Insufficient documentation

## 2023-03-08 ENCOUNTER — Other Ambulatory Visit (HOSPITAL_COMMUNITY): Payer: Self-pay

## 2023-03-08 ENCOUNTER — Other Ambulatory Visit: Payer: Self-pay

## 2023-03-08 MED ORDER — DICLOFENAC SODIUM 1 % EX GEL
CUTANEOUS | 3 refills | Status: DC
  Filled 2023-03-08: qty 100, 25d supply, fill #0
  Filled 2023-03-08: qty 200, 30d supply, fill #0

## 2023-03-08 MED ORDER — CYANOCOBALAMIN 5000 MCG SL SUBL: 1.0000 | SUBLINGUAL_TABLET | Freq: Every day | SUBLINGUAL | 3 refills | Status: DC

## 2023-03-16 ENCOUNTER — Other Ambulatory Visit: Payer: Self-pay

## 2023-03-22 DIAGNOSIS — K591 Functional diarrhea: Secondary | ICD-10-CM | POA: Insufficient documentation

## 2023-03-22 DIAGNOSIS — R1319 Other dysphagia: Secondary | ICD-10-CM | POA: Insufficient documentation

## 2023-03-22 DIAGNOSIS — R634 Abnormal weight loss: Secondary | ICD-10-CM | POA: Insufficient documentation

## 2023-03-22 DIAGNOSIS — K648 Other hemorrhoids: Secondary | ICD-10-CM | POA: Insufficient documentation

## 2023-03-24 ENCOUNTER — Ambulatory Visit: Payer: Medicare HMO | Attending: Student | Admitting: Student

## 2023-03-24 ENCOUNTER — Encounter: Payer: Self-pay | Admitting: Student

## 2023-03-24 ENCOUNTER — Other Ambulatory Visit: Payer: Self-pay

## 2023-03-24 VITALS — BP 108/62 | HR 45 | Ht 70.0 in | Wt 142.0 lb

## 2023-03-24 DIAGNOSIS — I480 Paroxysmal atrial fibrillation: Secondary | ICD-10-CM | POA: Diagnosis not present

## 2023-03-24 DIAGNOSIS — R001 Bradycardia, unspecified: Secondary | ICD-10-CM | POA: Diagnosis not present

## 2023-03-24 NOTE — Patient Instructions (Signed)
Medication Instructions:  Your physician recommends that you continue on your current medications as directed. Please refer to the Current Medication list given to you today.  *If you need a refill on your cardiac medications before your next appointment, please call your pharmacy*  Lab Work: None ordered If you have labs (blood work) drawn today and your tests are completely normal, you will receive your results only by: MyChart Message (if you have MyChart) OR A paper copy in the mail If you have any lab test that is abnormal or we need to change your treatment, we will call you to review the results.  Follow-Up: At [facility], you and your health needs are our priority.  As part of our continuing mission to provide you with exceptional heart care, we have created designated Provider Care Teams.  These Care Teams include your primary Cardiologist (physician) and Advanced Practice Providers (APPs -  Physician Assistants and Nurse Practitioners) who all work together to provide you with the care you need, when you need it.  Your next appointment:   6 month(s)  Provider:   Cameron Lambert, MD  

## 2023-03-24 NOTE — Progress Notes (Signed)
  Electrophysiology Office Note:   Date:  03/24/2023  ID:  Douglas Cox, DOB November 27, 1950, MRN [id number]  Primary Cardiologist: None Electrophysiologist: Lanier Prude, MD      History of Present Illness:   Douglas Cox is a 72 y.o. male with h/o CVA and paroxysmal AF seen today for routine electrophysiology followup.   Since last being seen in our clinic the patient reports doing well overall. Has a frozen shoulder and family thinks that limits him more than anything. He swims and hikes. HRs in 40s at rest mostly, gets up to 90s per apple watch with activity. Denies tachy palpitations, syncope, chest pain, or edema.   Review of systems complete and found to be negative unless listed in HPI.   EP Information / Studies Reviewed:    EKG is ordered today. Personal review as below.  EKG Interpretation Date/Time:  Thursday March 24 2023 11:27:43 EDT Ventricular Rate:  45 PR Interval:  190 QRS Duration:  90 QT Interval:  446 QTC Calculation: 385 R Axis:   37  Text Interpretation: Sinus bradycardia When compared with ECG of 24-Jul-2022 12:42, No significant change was found Confirmed by Maxine Glenn [phone]) on 03/24/2023 11:38:30 AM     Monitor(s) 08/2022 Monitor 1 HR 38 - 194, average 54 bpm. 11 nonsustained SVT, longest 20 beats. 3% AF burden. Rare supraventricular and ventricular ectopy.   Monitor 2 HR 38 - 169, average 48 bpm 1 NSVT lasting 13 beats. 22 SVT, longest 13 beats Rare supraventricular and ventricular ectopy. No atrial fibrillation.  Physical Exam:   VS:  BP 108/62   Pulse (!) 45   Ht 5\' 10"  (1.778 m)   Wt 142 lb (64.4 kg)   SpO2 97%   BMI 20.37 kg/m    Wt Readings from Last 3 Encounters:  03/24/23 142 lb (64.4 kg)  01/12/23 141 lb 12.8 oz (64.3 kg)  11/03/22 141 lb (64 kg)     GEN: Well nourished, well developed in no acute distress NECK: No JVD; No carotid bruits CARDIAC: Regular rate and rhythm, no murmurs, rubs,  gallops RESPIRATORY:  Clear to auscultation without rales, wheezing or rhonchi  ABDOMEN: Soft, non-tender, non-distended EXTREMITIES:  No edema; No deformity   ASSESSMENT AND PLAN:    Paroxysmal atrial fibrillation EKG today shows sinus bradycardia Continue Eliquis for CHA2DS2/VASc of at least 4.   Sinus bradycardia Rates up into 90s with activity per apple watch Briefly reviewed indications for pacing and symptoms to watch out for.   Follow up with Dr. Lalla Brothers in 6 months  Signed, Graciella Freer, PA-C

## 2023-03-29 ENCOUNTER — Ambulatory Visit (AMBULATORY_SURGERY_CENTER): Payer: Medicare HMO

## 2023-03-29 ENCOUNTER — Other Ambulatory Visit (HOSPITAL_COMMUNITY): Payer: Self-pay

## 2023-03-29 ENCOUNTER — Encounter: Payer: Self-pay | Admitting: Gastroenterology

## 2023-03-29 DIAGNOSIS — Z1211 Encounter for screening for malignant neoplasm of colon: Secondary | ICD-10-CM

## 2023-03-29 DIAGNOSIS — K5904 Chronic idiopathic constipation: Secondary | ICD-10-CM

## 2023-03-29 DIAGNOSIS — K219 Gastro-esophageal reflux disease without esophagitis: Secondary | ICD-10-CM

## 2023-03-29 DIAGNOSIS — K229 Disease of esophagus, unspecified: Secondary | ICD-10-CM

## 2023-03-29 DIAGNOSIS — R066 Hiccough: Secondary | ICD-10-CM

## 2023-03-29 DIAGNOSIS — R9389 Abnormal findings on diagnostic imaging of other specified body structures: Secondary | ICD-10-CM

## 2023-03-29 MED ORDER — PEG 3350-KCL-NA BICARB-NACL 420 G PO SOLR
4000.0000 mL | Freq: Once | ORAL | 0 refills | Status: AC
Start: 2023-03-29 — End: 2023-03-31
  Filled 2023-03-29: qty 4000, 1d supply, fill #0

## 2023-03-29 NOTE — Progress Notes (Signed)
No egg or soy allergy known to patient  No issues known to pt with past sedation with any surgeries or procedures Patient denies ever being told they had issues or difficulty with intubation  No FH of Malignant Hyperthermia Pt is not on diet pills Pt is not on  home 02  Pt is not on blood thinners  Pt reports constipation  No A fib or A flutter Have any cardiac testing pending--no  LOA: independent  Prep: Golytely extra miralax    Patient's chart reviewed by Cathlyn Parsons CNRA prior to previsit and patient appropriate for the [facility].  Previsit completed and red dot placed by patient's name on their procedure day (on provider's schedule).     PV competed with patient. Prep instructions sent via mychart. Patient advise to come to the office tomorrow to pick up a hard copy of the prep instructions

## 2023-03-30 ENCOUNTER — Other Ambulatory Visit: Payer: Self-pay

## 2023-03-30 ENCOUNTER — Other Ambulatory Visit (HOSPITAL_COMMUNITY): Payer: Self-pay

## 2023-04-05 ENCOUNTER — Telehealth: Payer: Self-pay | Admitting: Cardiology

## 2023-04-05 ENCOUNTER — Encounter: Payer: Self-pay | Admitting: Gastroenterology

## 2023-04-05 ENCOUNTER — Ambulatory Visit (AMBULATORY_SURGERY_CENTER): Payer: Medicare HMO | Admitting: Gastroenterology

## 2023-04-05 VITALS — BP 124/49 | HR 44 | Temp 98.4°F | Resp 20 | Ht 70.0 in | Wt 135.0 lb

## 2023-04-05 DIAGNOSIS — K31A0 Gastric intestinal metaplasia, unspecified: Secondary | ICD-10-CM | POA: Diagnosis not present

## 2023-04-05 DIAGNOSIS — Z1211 Encounter for screening for malignant neoplasm of colon: Secondary | ICD-10-CM

## 2023-04-05 DIAGNOSIS — K900 Celiac disease: Secondary | ICD-10-CM

## 2023-04-05 DIAGNOSIS — R9389 Abnormal findings on diagnostic imaging of other specified body structures: Secondary | ICD-10-CM

## 2023-04-05 DIAGNOSIS — D123 Benign neoplasm of transverse colon: Secondary | ICD-10-CM | POA: Diagnosis not present

## 2023-04-05 DIAGNOSIS — K219 Gastro-esophageal reflux disease without esophagitis: Secondary | ICD-10-CM

## 2023-04-05 MED ORDER — SODIUM CHLORIDE 0.9 % IV SOLN: 500.0000 mL | INTRAVENOUS | Status: DC

## 2023-04-05 MED ORDER — FLEET ENEMA RE ENEM
1.0000 | ENEMA | Freq: Once | RECTAL | Status: AC
Start: 2023-04-05 — End: 2023-04-05
  Administered 2023-04-05: 1 via RECTAL

## 2023-04-05 NOTE — Progress Notes (Signed)
Called to room to assist during endoscopic procedure.  Patient ID and intended procedure confirmed with present staff. Received instructions for my participation in the procedure from the performing physician.  

## 2023-04-05 NOTE — Progress Notes (Signed)
Monitor thinks pt went into v tach however pt was not ever in v tach.  Pt baseline is afib

## 2023-04-05 NOTE — Op Note (Signed)
[facility] Patient Name: Douglas Cox Procedure Date: 04/05/2023 9:22 AM MRN: [id number] Endoscopist: Napoleon Form , MD, [id number] Age: 72 Referring MD:  Date of Birth: Feb 09, 1951 Gender: Male Account #: [id number] Procedure:                Upper GI endoscopy Indications:              Esophageal reflux, Abnormal CT of the GI tract,                            Celiac disease Medicines:                Monitored Anesthesia Care Procedure:                Pre-Anesthesia Assessment:                           - Prior to the procedure, a History and Physical                            was performed, and patient medications and                            allergies were reviewed. The patient's tolerance of                            previous anesthesia was also reviewed. The risks                            and benefits of the procedure and the sedation                            options and risks were discussed with the patient.                            All questions were answered, and informed consent                            was obtained. Prior Anticoagulants: The patient                            last took Eliquis (apixaban) 2 days prior to the                            procedure. ASA Grade Assessment: III - A patient                            with severe systemic disease. After reviewing the                            risks and benefits, the patient was deemed in                            satisfactory condition to undergo the procedure.  After obtaining informed consent, the endoscope was                            passed under direct vision. Throughout the                            procedure, the patient's blood pressure, pulse, and                            oxygen saturations were monitored continuously. The                            Olympus scope [id number] was introduced through the                            mouth, and advanced to  the second part of duodenum.                            The upper GI endoscopy was accomplished without                            difficulty. The patient tolerated the procedure                            well. Scope In: Scope Out: Findings:                 A 3 cm hiatal hernia was present.                           A non-obstructing, widely patent Schatzki ring was                            found at the gastroesophageal junction.                           The exam of the esophagus was otherwise normal.                           The stomach was normal.                           The cardia and gastric fundus were normal on                            retroflexion.                           Decreased folds were found in the first portion of                            the duodenum, decreased folds were found in the                            second portion of the duodenum, flattening was  found in the first portion of the duodenum and                            flattening was found in the second portion of the                            duodenum. Biopsies for histology were taken with a                            cold forceps for evaluation of celiac disease. Complications:            No immediate complications. Estimated Blood Loss:     Estimated blood loss was minimal. Impression:               - 3 cm hiatal hernia.                           - Non-obstructing Schatzki ring.                           - Normal stomach.                           - Duodenal mucosal changes seen, consistent with                            celiac disease. Biopsied. Recommendation:           - Patient has a contact number available for                            emergencies. The signs and symptoms of potential                            delayed complications were discussed with the                            patient. Return to normal activities tomorrow.                             Written discharge instructions were provided to the                            patient.                           - Resume previous diet.                           - Continue present medications.                           - Await pathology results.                           - See the other procedure note for documentation of  additional recommendations. Napoleon Form, MD 04/05/2023 11:02:24 AM This report has been signed electronically.

## 2023-04-05 NOTE — Telephone Encounter (Signed)
Pt's wife states the pt had a AFIB event over the weekend and would like to know if it happens again what they can do for it at home. Please advise

## 2023-04-05 NOTE — Telephone Encounter (Signed)
Spoke with the patient's wife who states that the patient had an episode over the weekend where he felt like his heart was racing. She states that the patient was lying down and got up and came and got her. She states that he was panicking which scared her too so she called 911. She states that the patient had no other symptoms. She states that it resolved on its own prior to EMS arrival. She is unsure what his heart rate was at the time. He was wearing a smart watch but she did not look at the data from it. She states that the patient reported that it did not feel the same as previous episodes of AFIB where he felt his heart fluttering. The patient is taking Eliquis 5 mg twice daily. Advised to continue to monitor and try and capture heart rate and EKG reading if it happens again and to let us know.

## 2023-04-05 NOTE — Progress Notes (Signed)
Patient states there have been no changes to medical or surgical history since time of pre-visit. 

## 2023-04-05 NOTE — Progress Notes (Signed)
Gastroenterology History and Physical   Primary Care Physician:  [name], DO   Reason for Procedure:  Abnormal CT of chest, esophageal thickening, colon cancer screening  Plan:    EGD and colonoscopy with possible interventions as needed     HPI: Douglas Cox is a very pleasant 72 y.o. male here for EGD and colonoscopy for esophageal thickening and colon cancer screening.   The risks and benefits as well as alternatives of endoscopic procedure(s) have been discussed and reviewed. All questions answered. The patient agrees to proceed.    Past Medical History:  Diagnosis Date   Celiac disease    Retinal detachment    Stroke [facility])     Past Surgical History:  Procedure Laterality Date   CATARACT EXTRACTION Bilateral    Dr. Cathey Endow   EYE SURGERY     FRACTURE SURGERY     GAS INSERTION Right 05/21/2021   Procedure: INSERTION OF GAS - C3F8;  Surgeon: Rennis Chris, MD;  Location: [facility] OR;  Service: Ophthalmology;  Laterality: Right;   GAS/FLUID EXCHANGE Right 05/21/2021   Procedure: GAS/FLUID EXCHANGE;  Surgeon: Rennis Chris, MD;  Location: [facility] OR;  Service: Ophthalmology;  Laterality: Right;   HERNIA REPAIR     IR CT HEAD LTD  07/17/2022   IR PERCUTANEOUS ART THROMBECTOMY/INFUSION INTRACRANIAL INC DIAG ANGIO  07/17/2022   IR US GUIDE VASC ACCESS RIGHT  07/17/2022   PARS PLANA VITRECTOMY Right 05/21/2021   Procedure: PARS PLANA VITRECTOMY WITH 25 GAUGE;  Surgeon: Rennis Chris, MD;  Location: [facility] OR;  Service: Ophthalmology;  Laterality: Right;   PERFLUORONE INJECTION Right 05/21/2021   Procedure: PERFLUORONE INJECTION;  Surgeon: Rennis Chris, MD;  Location: [facility] OR;  Service: Ophthalmology;  Laterality: Right;   PHOTOCOAGULATION WITH LASER Right 05/21/2021   Procedure: PHOTOCOAGULATION WITH LASER;  Surgeon: Rennis Chris, MD;  Location: [facility] OR;  Service: Ophthalmology;  Laterality: Right;   RADIOLOGY WITH ANESTHESIA N/A 07/17/2022   Procedure: IR WITH  ANESTHESIA;  Surgeon: Radiologist, Medication, MD;  Location: [facility] OR;  Service: Radiology;  Laterality: N/A;   RETINAL DETACHMENT SURGERY      Prior to Admission medications   Medication Sig Start Date End Date Taking? Authorizing Provider  apixaban (ELIQUIS) 5 MG TABS tablet Take 1 tablet (5 mg total) by mouth 2 (two) times daily. 11/12/22   Ranelle Oyster, MD  Cyanocobalamin (VITAMIN B-12) 5000 MCG SUBL Place 1 tablet under the tongue daily.    [provider]  famotidine (PEPCID) 20 MG tablet Take 1 tablet (20 mg total) by mouth at bedtime. 10/06/22   Napoleon Form, MD  magnesium oxide (MAG-OX) 400 (240 Mg) MG tablet Take 0.5 tablets (200 mg total) by mouth at bedtime. 08/27/22   Ranelle Oyster, MD  pantoprazole (PROTONIX) 40 MG tablet Take 1 tablet (40 mg total) by mouth once daily each morning. 10/06/22   Napoleon Form, MD  rosuvastatin (CRESTOR) 20 MG tablet Take 1 tablet (20 mg total) by mouth daily. 02/07/23   Ranelle Oyster, MD  traMADol Janean Sark) 50 MG tablet Take 1 tablet (50 mg total) by mouth every 6 (six) hours as needed for pain 09/27/22     vitamin D3 (CHOLECALCIFEROL) 25 MCG tablet Take 1 tablet (1,000 Units total) by mouth daily. 08/27/22   Ranelle Oyster, MD    Current Outpatient Medications  Medication Sig Dispense Refill   apixaban (ELIQUIS) 5 MG TABS tablet Take 1 tablet (5 mg total) by mouth 2 (  two) times daily. 120 tablet 3   Cyanocobalamin (VITAMIN B-12) 5000 MCG SUBL Place 1 tablet under the tongue daily.     famotidine (PEPCID) 20 MG tablet Take 1 tablet (20 mg total) by mouth at bedtime. 90 tablet 4   magnesium oxide (MAG-OX) 400 (240 Mg) MG tablet Take 0.5 tablets (200 mg total) by mouth at bedtime. 30 tablet 5   pantoprazole (PROTONIX) 40 MG tablet Take 1 tablet (40 mg total) by mouth once daily each morning. 90 tablet 3   rosuvastatin (CRESTOR) 20 MG tablet Take 1 tablet (20 mg total) by mouth daily. 30 tablet 7   traMADol (ULTRAM) 50 MG  tablet Take 1 tablet (50 mg total) by mouth every 6 (six) hours as needed for pain 20 tablet 0   vitamin D3 (CHOLECALCIFEROL) 25 MCG tablet Take 1 tablet (1,000 Units total) by mouth daily. 30 tablet 5   Current Facility-Administered Medications  Medication Dose Route Frequency Provider Last Rate Last Admin   0.9 %  sodium chloride infusion  500 mL Intravenous Continuous Cinthya Bors, Eleonore Chiquito, MD        Allergies as of 04/05/2023 - Review Complete 04/05/2023  Allergen Reaction Noted   Nitrous oxide Other (See Comments) 05/21/2021   Codone [hydrocodone] Hives 01/30/2018   Tylenol [acetaminophen] Hives 07/08/2021   Hydrocodone-acetaminophen  08/31/2022   Oxycodone hcl  08/31/2022   Clarithromycin Anxiety 01/30/2018   Gluten meal Other (See Comments) 07/19/2022   Lactose intolerance (gi) Other (See Comments) 07/19/2022    No family history on file.  Social History   Socioeconomic History   Marital status: Married    Spouse name: Not on file   Number of children: Not on file   Years of education: Not on file   Highest education level: Not on file  Occupational History   Not on file  Tobacco Use   Smoking status: Former    Current packs/day: 0.00    Types: Cigarettes    Quit date: 01/30/2006    Years since quitting: 17.1   Smokeless tobacco: Never  Vaping Use   Vaping status: Never Used  Substance and Sexual Activity   Alcohol use: Not Currently    Alcohol/week: 4.0 - 5.0 standard drinks of alcohol    Types: 4 - 5 Shots of liquor per week    Comment: 3 glasses of whiskey per night.   Drug use: Not Currently    Frequency: 7.0 times per week    Types: Marijuana   Sexual activity: Not Currently  Other Topics Concern   Not on file  Social History Narrative   Not on file   Social Determinants of Health   Financial Resource Strain: Not on file  Food Insecurity: No Food Insecurity (07/21/2022)   Hunger Vital Sign    Worried About Running Out of Food in the Last Year: Never  true    Ran Out of Food in the Last Year: Never true  Transportation Needs: No Transportation Needs (07/21/2022)   PRAPARE - Administrator, Civil Service (Medical): No    Lack of Transportation (Non-Medical): No  Physical Activity: Not on file  Stress: Not on file  Social Connections: Not on file  Intimate Partner Violence: Not At Risk (07/21/2022)   Humiliation, Afraid, Rape, and Kick questionnaire    Fear of Current or Ex-Partner: No    Emotionally Abused: No    Physically Abused: No    Sexually Abused: No    Review of Systems:  All other review of systems negative except as mentioned in the HPI.  Physical Exam: Vital signs in last 24 hours: BP 139/75   Pulse (!) 47   Temp 98.4 F (36.9 C) (Skin)   Resp 15   Ht 5\' 10"  (1.778 m)   Wt 135 lb (61.2 kg)   SpO2 100%   BMI 19.37 kg/m  General:   Alert, NAD Lungs:  Clear .   Heart:  Regular rate and rhythm Abdomen:  Soft, nontender and nondistended. Neuro/Psych:  Alert and cooperative. Normal mood and affect. A and O x 3  Reviewed labs, radiology imaging, old records and pertinent past GI work up  Patient is appropriate for planned procedure(s) and anesthesia in an ambulatory setting   K. Scherry Ran , MD [phone]

## 2023-04-05 NOTE — Progress Notes (Signed)
Vss nad trans to pacu 

## 2023-04-05 NOTE — Op Note (Signed)
[facility] Patient Name: Douglas Cox Procedure Date: 04/05/2023 9:11 AM MRN: [id number] Endoscopist: Napoleon Form , MD, [id number] Age: 72 Referring MD:  Date of Birth: 1950-09-23 Gender: Male Account #: [id number] Procedure:                Colonoscopy Indications:              Screening for colorectal malignant neoplasm Medicines:                Monitored Anesthesia Care Procedure:                Pre-Anesthesia Assessment:                           - Prior to the procedure, a History and Physical                            was performed, and patient medications and                            allergies were reviewed. The patient's tolerance of                            previous anesthesia was also reviewed. The risks                            and benefits of the procedure and the sedation                            options and risks were discussed with the patient.                            All questions were answered, and informed consent                            was obtained. Prior Anticoagulants: The patient                            last took Eliquis (apixaban) 2 days prior to the                            procedure. ASA Grade Assessment: III - A patient                            with severe systemic disease. After reviewing the                            risks and benefits, the patient was deemed in                            satisfactory condition to undergo the procedure.                           After obtaining informed consent, the colonoscope  was passed under direct vision. Throughout the                            procedure, the patient's blood pressure, pulse, and                            oxygen saturations were monitored continuously. The                            Olympus Scope [id number] was introduced through the                            anus and advanced to the the cecum, identified by                             appendiceal orifice and ileocecal valve. The                            colonoscopy was performed without difficulty. The                            patient tolerated the procedure well. The quality                            of the bowel preparation was adequate. The                            ileocecal valve, appendiceal orifice, and rectum                            were photographed. Scope In: 10:17:03 AM Scope Out: 10:45:15 AM Scope Withdrawal Time: 0 hours 8 minutes 55 seconds  Total Procedure Duration: 0 hours 28 minutes 12 seconds  Findings:                 The perianal and digital rectal examinations were                            normal.                           Two sessile polyps were found in the transverse                            colon. The polyps were 3 to 5 mm in size. These                            polyps were removed with a cold snare. Resection                            and retrieval were complete.                           Scattered small-mouthed diverticula were found in  the sigmoid colon and descending colon.                           Non-bleeding external and internal hemorrhoids were                            found during retroflexion. The hemorrhoids were                            medium-sized. Complications:            No immediate complications. Estimated Blood Loss:     Estimated blood loss was minimal. Impression:               - Two 3 to 5 mm polyps in the transverse colon,                            removed with a cold snare. Resected and retrieved.                           - Diverticulosis in the sigmoid colon and in the                            descending colon.                           - Non-bleeding external and internal hemorrhoids. Recommendation:           - Patient has a contact number available for                            emergencies. The signs and symptoms of potential                             delayed complications were discussed with the                            patient. Return to normal activities tomorrow.                            Written discharge instructions were provided to the                            patient.                           - Resume previous diet.                           - Continue present medications.                           - Await pathology results.                           - No repeat colonoscopy due to age.                           -  Resume Eliquis (apixaban) at prior dose today.                            Refer to managing physician for further adjustment                            of therapy. Napoleon Form, MD 04/05/2023 10:59:01 AM This report has been signed electronically.

## 2023-04-05 NOTE — Progress Notes (Signed)
Placed IV before case staryted  24G in L hand.  Previous pre op attempts unsucessful

## 2023-04-05 NOTE — Patient Instructions (Signed)
RESUME ELIQUIS AT PRIOR DOSAGE TODAY,REFER TO MANAGING PHYSICIAN FOR FURTHER ADJUSTMENT OF THERAPY  Handouts on polyps,diverticulosis,& hemorrhoids given to you today.   Await pathology results on colon polyps removed   Await duodenal biopsy results to evaluate celiac disease     YOU HAD AN ENDOSCOPIC PROCEDURE TODAY AT THE [facility]:   Refer to the procedure report that was given to you for any specific questions about what was found during the examination.  If the procedure report does not answer your questions, please call your gastroenterologist to clarify.  If you requested that your care partner not be given the details of your procedure findings, then the procedure report has been included in a sealed envelope for you to review at your convenience later.  YOU SHOULD EXPECT: Some feelings of bloating in the abdomen. Passage of more gas than usual.  Walking can help get rid of the air that was put into your GI tract during the procedure and reduce the bloating. If you had a lower endoscopy (such as a colonoscopy or flexible sigmoidoscopy) you may notice spotting of blood in your stool or on the toilet paper. If you underwent a bowel prep for your procedure, you may not have a normal bowel movement for a few days.  Please Note:  You might notice some irritation and congestion in your nose or some drainage.  This is from the oxygen used during your procedure.  There is no need for concern and it should clear up in a day or so.  SYMPTOMS TO REPORT IMMEDIATELY:  Following lower endoscopy (colonoscopy or flexible sigmoidoscopy):  Excessive amounts of blood in the stool  Significant tenderness or worsening of abdominal pains  Swelling of the abdomen that is new, acute  Fever of 100F or higher  Following upper endoscopy (EGD)  Vomiting of blood or coffee ground material  New chest pain or pain under the shoulder blades  Painful or persistently difficult swallowing  New  shortness of breath  Fever of 100F or higher  Black, tarry-looking stools  For urgent or emergent issues, a gastroenterologist can be reached at any hour by calling [phone]. Do not use MyChart messaging for urgent concerns.    DIET:  We do recommend a small meal at first, but then you may proceed to your regular diet.  Drink plenty of fluids but you should avoid alcoholic beverages for 24 hours.  ACTIVITY:  You should plan to take it easy for the rest of today and you should NOT DRIVE or use heavy machinery until tomorrow (because of the sedation medicines used during the test).    FOLLOW UP: Our staff will call the number listed on your records the next business day following your procedure.  We will call around 7:15- 8:00 am to check on you and address any questions or concerns that you may have regarding the information given to you following your procedure. If we do not reach you, we will leave a message.     If any biopsies were taken you will be contacted by phone or by letter within the next 1-3 weeks.  Please call us at [phone] if you have not heard about the biopsies in 3 weeks.    SIGNATURES/CONFIDENTIALITY: You and/or your care partner have signed paperwork which will be entered into your electronic medical record.  These signatures attest to the fact that that the information above on your After Visit Summary has been reviewed and is understood.  Full responsibility of the confidentiality of this discharge information lies with you and/or your care-partner.

## 2023-04-06 ENCOUNTER — Telehealth: Payer: Self-pay

## 2023-04-06 DIAGNOSIS — Z860100 Personal history of colon polyps, unspecified: Secondary | ICD-10-CM | POA: Insufficient documentation

## 2023-04-06 DIAGNOSIS — R5383 Other fatigue: Secondary | ICD-10-CM | POA: Insufficient documentation

## 2023-04-06 NOTE — Telephone Encounter (Signed)
Post procedure follow up call, no answer 

## 2023-04-12 NOTE — Progress Notes (Signed)
[facility] - Clinic Note  04/13/2023     CHIEF COMPLAINT Patient presents for Retina Follow Up  HISTORY OF PRESENT ILLNESS: Douglas Cox is a 72 y.o. male who presents to the clinic today for:   HPI     Retina Follow Up   Patient presents with  Retinal Break/Detachment.  In right eye.  This started 6 months ago.  I, the attending physician,  performed the HPI with the patient and updated documentation appropriately.        Comments   Patient here for 6 months retina follow up for RD OD/DMV Patient states vision doing excellent. No eye pain. Had a stroke in Decemter 2023.       Last edited by Rennis Chris, MD on 04/17/2023 10:45 PM.    Pt is here bc his insurance company requested a HVF due to the fact that he had a stroke in December 2023  Referring physician: [name], DO [address] [place],  [place] [address]  HISTORICAL INFORMATION:  Selected notes from the MEDICAL RECORD NUMBER Self-referral for eval of increased floaters/veil OS   CURRENT MEDICATIONS: No current outpatient medications on file. (Ophthalmic Drugs)   No current facility-administered medications for this visit. (Ophthalmic Drugs)   Current Outpatient Medications (Other)  Medication Sig   apixaban (ELIQUIS) 5 MG TABS tablet Take 1 tablet (5 mg total) by mouth 2 (two) times daily.   Cyanocobalamin (VITAMIN B-12) 5000 MCG SUBL Place 1 tablet under the tongue daily.   famotidine (PEPCID) 20 MG tablet Take 1 tablet (20 mg total) by mouth at bedtime.   magnesium oxide (MAG-OX) 400 (240 Mg) MG tablet Take 0.5 tablets (200 mg total) by mouth at bedtime.   pantoprazole (PROTONIX) 40 MG tablet Take 1 tablet (40 mg total) by mouth once daily each morning.   rosuvastatin (CRESTOR) 20 MG tablet Take 1 tablet (20 mg total) by mouth daily.   vitamin D3 (CHOLECALCIFEROL) 25 MCG tablet Take 1 tablet (1,000 Units total) by mouth daily.   traMADol (ULTRAM) 50 MG tablet Take  1 tablet (50 mg total) by mouth every 6 (six) hours as needed for pain (Patient not taking: Reported on 04/13/2023)   No current facility-administered medications for this visit. (Other)   REVIEW OF SYSTEMS: ROS   Positive for: Neurological, Cardiovascular, Eyes Negative for: Constitutional, Gastrointestinal, Skin, Genitourinary, Musculoskeletal, HENT, Endocrine, Respiratory, Psychiatric, Allergic/Imm, Heme/Lymph Last edited by Laddie Aquas, COA on 04/13/2023 10:11 AM.     ALLERGIES Allergies  Allergen Reactions   Nitrous Oxide Other (See Comments)    Can cause blindness in right eye    Codone [Hydrocodone] Hives   Tylenol [Acetaminophen] Hives   Hydrocodone-Acetaminophen     Other Reaction(s): itching/hives   Oxycodone Hcl     Other Reaction(s): itching/hives   Clarithromycin Anxiety    Other Reaction(s): agitated   Gluten Meal Other (See Comments)    Stomach issues   Lactose Intolerance (Gi) Other (See Comments)    GI issues   PAST MEDICAL HISTORY Past Medical History:  Diagnosis Date   Atrial fibrillation (HCC)    Celiac disease    COPD (chronic obstructive pulmonary disease) (HCC)    Retinal detachment    Stroke [facility])    Past Surgical History:  Procedure Laterality Date   CATARACT EXTRACTION Bilateral    Dr. Cathey Endow   EYE SURGERY     FRACTURE SURGERY     GAS INSERTION Right 05/21/2021   Procedure:  INSERTION OF GAS - C3F8;  Surgeon: Rennis Chris, MD;  Location: [facility] OR;  Service: Ophthalmology;  Laterality: Right;   GAS/FLUID EXCHANGE Right 05/21/2021   Procedure: GAS/FLUID EXCHANGE;  Surgeon: Rennis Chris, MD;  Location: [facility] OR;  Service: Ophthalmology;  Laterality: Right;   HERNIA REPAIR     IR CT HEAD LTD  07/17/2022   IR PERCUTANEOUS ART THROMBECTOMY/INFUSION INTRACRANIAL INC DIAG ANGIO  07/17/2022   IR US GUIDE VASC ACCESS RIGHT  07/17/2022   PARS PLANA VITRECTOMY Right 05/21/2021   Procedure: PARS PLANA VITRECTOMY WITH 25 GAUGE;  Surgeon: Rennis Chris,  MD;  Location: [facility] OR;  Service: Ophthalmology;  Laterality: Right;   PERFLUORONE INJECTION Right 05/21/2021   Procedure: PERFLUORONE INJECTION;  Surgeon: Rennis Chris, MD;  Location: [facility] OR;  Service: Ophthalmology;  Laterality: Right;   PHOTOCOAGULATION WITH LASER Right 05/21/2021   Procedure: PHOTOCOAGULATION WITH LASER;  Surgeon: Rennis Chris, MD;  Location: [facility] OR;  Service: Ophthalmology;  Laterality: Right;   RADIOLOGY WITH ANESTHESIA N/A 07/17/2022   Procedure: IR WITH ANESTHESIA;  Surgeon: Radiologist, Medication, MD;  Location: [facility] OR;  Service: Radiology;  Laterality: N/A;   RETINAL DETACHMENT SURGERY     FAMILY HISTORY Family History  Problem Relation Age of Onset   Colon cancer Neg Hx    Esophageal cancer Neg Hx    Rectal cancer Neg Hx    Stomach cancer Neg Hx     SOCIAL HISTORY Social History   Tobacco Use   Smoking status: Former    Current packs/day: 0.00    Types: Cigarettes    Quit date: 01/30/2006    Years since quitting: 17.2   Smokeless tobacco: Never  Vaping Use   Vaping status: Never Used  Substance Use Topics   Alcohol use: Not Currently    Alcohol/week: 2.0 standard drinks of alcohol    Types: 2 Shots of liquor per week   Drug use: Not Currently    Frequency: 7.0 times per week    Types: Marijuana    Comment: last use 04/03/23       OPHTHALMIC EXAM: Base Eye Exam     Visual Acuity (Snellen - Linear)       Right Left   Dist cc 20/20 20/20    Correction: Glasses         Tonometry (Tonopen, 10:08 AM)       Right Left   Pressure 11 08         Pupils       Dark Light Shape React APD   Right 5 4 Round Brisk None   Left 4 3 Round Brisk None         Visual Fields (Counting fingers)       Left Right    Full Full         Extraocular Movement       Right Left    Full, Ortho Full, Ortho         Neuro/Psych     Oriented x3: Yes   Mood/Affect: Normal         Dilation     Both eyes: 1.0% Mydriacyl, 2.5% Phenylephrine  @ 10:08 AM           Slit Lamp and Fundus Exam     Slit Lamp Exam       Right Left   Lids/Lashes Dermatochalasis - upper lid Dermatochalasis - upper lid   Conjunctiva/Sclera White and quiet White and quiet   Cornea Arcus, well  healed temporal cataract wounds, trace PEE arcus, well healed temporal cataract wounds, trace PEE   Anterior Chamber deep and clear deep and clear   Iris Round and dilated Round and dilated   Lens PC IOL in excellent position, 1+Posterior capsular opacification PC IOL in good position, trace Posterior capsular opacification   Anterior Vitreous Post vitrectomy mild syneresis, no pigment, Posterior vitreous detachment, promin, vitreous condensations settled inferiorly         Fundus Exam       Right Left   Disc Pink and Sharp, Compact Pink and Sharp, Compact, mild tilt   C/D Ratio 0.2 0.2   Macula Flat, Blunted foveal reflex, trace ERM, RPE mottling, No heme or edema Flat, Blunted foveal reflex, mild RPE mottling and clumping, No heme or edema   Vessels attenuated, mild tortuosity attenuated, mild tortuosity   Periphery Retina re-attached; 360 peripheral laser changes. ORIGINALLY: Bullous nasal RD from 1230-0630 with small HST at 0130; good cryo changes around 0130 tear Attached, mild pigmented paving stone degeneration IT quad, No RT/RD, No heme           Refraction     Wearing Rx       Sphere Cylinder Axis Add   Right +0.25 Sphere  +2.50   Left -0.50 +0.75 117 +2.50           IMAGING AND PROCEDURES  Imaging and Procedures for 04/13/2023  OCT, Retina - OU - Both Eyes       Right Eye Quality was good. Central Foveal Thickness: 288. Progression has been stable. Findings include normal foveal contour, no IRF, no SRF, myopic contour, epiretinal membrane (Inferior retina stably re-attached, trace ERM).   Left Eye Quality was good. Central Foveal Thickness: 262. Progression has been stable. Findings include normal foveal contour, no IRF, no  SRF (Trace ERM).   Notes *Images captured and stored on drive  Diagnosis / Impression:  OD: NFP, no IRF; Inferior retina stably re-attached, tr ERM OS: NFP, no IRF/SRF  Clinical management:  See below  Abbreviations: NFP - Normal foveal profile. CME - cystoid macular edema. PED - pigment epithelial detachment. IRF - intraretinal fluid. SRF - subretinal fluid. EZ - ellipsoid zone. ERM - epiretinal membrane. ORA - outer retinal atrophy. ORT - outer retinal tubulation. SRHM - subretinal hyper-reflective material. IRHM - intraretinal hyper-reflective material      Humphrey Visual Field - OU - Both Eyes       Right Eye Reliability was good. Progression has no prior data. Findings include superior paracentral defect.   Left Eye Reliability was good. Progression has no prior data. Findings include superior paracentral defect.   Notes HVF 30-2 OU SITA-STD  OD Fixation losses: 1/18 False positive errors: 7% False negative errors: 0% Findings: superior paracentral defect  OS Fixation losses: 1/17 False positive errors: 13% False negative errors: 8% Findings: mild superior paracentral defect            ASSESSMENT/PLAN:   ICD-10-CM   1. Right retinal detachment  H33.21 OCT, Retina - OU - Both Eyes    Humphrey Visual Field - OU - Both Eyes    2. Posterior vitreous detachment of left eye  H43.812 OCT, Retina - OU - Both Eyes    3. Pseudophakia, both eyes  Z96.1     4. Dry eyes  H04.123     5. 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 (HCC)  I63.81 Humphrey Visual Field - OU - Both Eyes  1. 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 PVD OS  - acute development of prominent floaters OS -- saw Dr. Dione Booze on call on Saturday, 10.16.21  - prominent floaters / vitreous condensations remain settled inferiorly  - Discussed findings and prognosis   - No RT or RD on 360 scleral depressed exam  - Reviewed s/s of RT/RD  - Strict return precautions for any such RT/RD signs/symptoms   - f/u in 1 year -- DFE/OCT  3. Pseudophakia OU  - s/p CE/IOL OU (Dr. Cathey Endow)  - IOLs in good position, doing well  - mild PCO OU  - monitor   4. Dry eyes OU - recommend artificial tears and lubricating ointment as needed  5. History of CVA  - HVF 30-2 performed today 09.11.24  - mild superior paracentral defects OU -- not likely related to history of CVA  Ophthalmic Meds Ordered this visit:  No orders of the defined types were placed in this encounter.    Return in 1 year (on 04/12/2024) for f/u RD OD, DFE, OCT.  There are no Patient Instructions on file for this visit.  This document serves as a record of services personally performed by Karie Chimera, MD, PhD. It was created on their behalf by Glee Arvin. Manson Passey, OA an ophthalmic technician. The creation of this record is the provider's dictation and/or activities during the visit.    Electronically signed by: Glee Arvin. Manson Passey, OA 04/17/23 10:47 PM  Karie Chimera, M.D., Ph.D. Diseases & Surgery of the Retina and Vitreous [facility]  I have reviewed the above documentation for accuracy and completeness, and I agree with the above. Karie Chimera, M.D., Ph.D. 04/17/23 10:52 PM  Abbreviations: M myopia (nearsighted); A astigmatism; H hyperopia (farsighted); P presbyopia; Mrx spectacle prescription;  CTL contact lenses; OD right eye;  OS left eye; OU both eyes  XT exotropia; ET esotropia; PEK punctate epithelial keratitis; PEE punctate epithelial erosions; DES dry eye syndrome; MGD meibomian gland dysfunction; ATs artificial tears; PFAT's preservative free artificial tears; NSC nuclear sclerotic cataract; PSC posterior subcapsular cataract; ERM epi-retinal membrane; PVD posterior vitreous detachment; RD retinal detachment; DM diabetes mellitus; DR diabetic retinopathy; NPDR non-proliferative diabetic retinopathy; PDR proliferative diabetic retinopathy; CSME clinically significant macular edema; DME diabetic macular edema; dbh dot blot hemorrhages; CWS cotton wool spot; POAG primary open angle glaucoma; C/D cup-to-disc ratio; HVF humphrey visual field; GVF goldmann visual field; OCT optical coherence tomography; IOP intraocular pressure; BRVO Branch retinal vein occlusion; CRVO central retinal vein occlusion; CRAO central retinal artery occlusion; BRAO branch retinal artery occlusion; RT retinal tear; SB scleral buckle; PPV pars plana vitrectomy; VH Vitreous hemorrhage; PRP panretinal laser photocoagulation; IVK intravitreal kenalog; VMT vitreomacular traction; MH Macular hole;  NVD  neovascularization of the disc; NVE neovascularization elsewhere; AREDS age related eye disease study; ARMD age related macular degeneration; POAG primary open angle glaucoma; EBMD epithelial/anterior basement membrane dystrophy; ACIOL anterior chamber intraocular lens; IOL intraocular lens; PCIOL posterior chamber intraocular lens; Phaco/IOL phacoemulsification with intraocular lens placement; PRK photorefractive keratectomy; LASIK laser assisted in situ keratomileusis; HTN hypertension; DM diabetes mellitus; COPD chronic obstructive pulmonary disease

## 2023-04-13 ENCOUNTER — Ambulatory Visit (INDEPENDENT_AMBULATORY_CARE_PROVIDER_SITE_OTHER): Payer: Medicare HMO | Admitting: Ophthalmology

## 2023-04-13 ENCOUNTER — Encounter (INDEPENDENT_AMBULATORY_CARE_PROVIDER_SITE_OTHER): Payer: Self-pay | Admitting: Ophthalmology

## 2023-04-13 DIAGNOSIS — H43812 Vitreous degeneration, left eye: Secondary | ICD-10-CM | POA: Diagnosis not present

## 2023-04-13 DIAGNOSIS — I6381 Other cerebral infarction due to occlusion or stenosis of small artery: Secondary | ICD-10-CM

## 2023-04-13 DIAGNOSIS — H3321 Serous retinal detachment, right eye: Secondary | ICD-10-CM | POA: Diagnosis not present

## 2023-04-13 DIAGNOSIS — Z961 Presence of intraocular lens: Secondary | ICD-10-CM

## 2023-04-13 DIAGNOSIS — H04123 Dry eye syndrome of bilateral lacrimal glands: Secondary | ICD-10-CM

## 2023-04-17 ENCOUNTER — Encounter (INDEPENDENT_AMBULATORY_CARE_PROVIDER_SITE_OTHER): Payer: Self-pay | Admitting: Ophthalmology

## 2023-04-18 ENCOUNTER — Other Ambulatory Visit: Payer: Self-pay

## 2023-04-19 ENCOUNTER — Other Ambulatory Visit: Payer: Self-pay

## 2023-04-19 ENCOUNTER — Other Ambulatory Visit (HOSPITAL_COMMUNITY): Payer: Self-pay

## 2023-04-21 ENCOUNTER — Other Ambulatory Visit: Payer: Self-pay

## 2023-04-22 ENCOUNTER — Encounter: Payer: Self-pay | Admitting: Pharmacist

## 2023-04-22 ENCOUNTER — Other Ambulatory Visit: Payer: Self-pay

## 2023-04-25 ENCOUNTER — Other Ambulatory Visit (HOSPITAL_COMMUNITY): Payer: Self-pay

## 2023-04-25 ENCOUNTER — Other Ambulatory Visit: Payer: Self-pay

## 2023-04-26 ENCOUNTER — Encounter: Payer: Self-pay | Admitting: Gastroenterology

## 2023-04-26 DIAGNOSIS — M25511 Pain in right shoulder: Secondary | ICD-10-CM | POA: Insufficient documentation

## 2023-05-05 DIAGNOSIS — S61459A Open bite of unspecified hand, initial encounter: Secondary | ICD-10-CM | POA: Insufficient documentation

## 2023-05-13 ENCOUNTER — Telehealth: Payer: Self-pay | Admitting: Gastroenterology

## 2023-05-13 NOTE — Telephone Encounter (Signed)
Inbound call from patient's wife requesting a call back regarding 9/3 colonoscopy results. Please advise, thank you.

## 2023-05-13 NOTE — Telephone Encounter (Signed)
Spoke with the spouse. Discussed reason that screening stops after age 73 years.

## 2023-05-20 ENCOUNTER — Other Ambulatory Visit (HOSPITAL_COMMUNITY): Payer: Self-pay

## 2023-05-20 ENCOUNTER — Other Ambulatory Visit: Payer: Self-pay

## 2023-05-24 ENCOUNTER — Other Ambulatory Visit (HOSPITAL_COMMUNITY): Payer: Self-pay

## 2023-06-15 ENCOUNTER — Telehealth: Payer: Self-pay

## 2023-06-15 NOTE — Telephone Encounter (Addendum)
Douglas Cox has a Root-Canal scheduled for 06/23/2023.  Dr. Jolene Provost DDS has requested written guidance on his Rx Eliquis.   Please call Douglas Cox when the note is ready for pick up. [phone]). Thank you.

## 2023-06-16 NOTE — Telephone Encounter (Signed)
A generic voice message left for a call back to receive Dr. Riley Kill reply. Patient had no identification on his voicemail.

## 2023-06-16 NOTE — Telephone Encounter (Signed)
  Mr. Buchanon has been advised to call  the Cardiologist, Lanier Prude, MD.  With [facility]. Phone number given [phone].

## 2023-06-20 ENCOUNTER — Other Ambulatory Visit: Payer: Self-pay

## 2023-06-20 ENCOUNTER — Other Ambulatory Visit (HOSPITAL_COMMUNITY): Payer: Self-pay

## 2023-06-21 ENCOUNTER — Telehealth: Payer: Self-pay | Admitting: Cardiology

## 2023-06-21 NOTE — Telephone Encounter (Signed)
Pharmacy please advise on holding Eliquis prior to root canal scheduled for 06/23/2023. Thank you.

## 2023-06-21 NOTE — Telephone Encounter (Signed)
   Pre-operative Risk Assessment    Patient Name: Douglas Cox  DOB: 1951/02/13 MRN: [id number]     Request for Surgical Clearance    Procedure:   Root Canal  Date of Surgery:  Clearance 06/23/23                                 Surgeon:  Dr. Elliot Dally Surgeon's Group or Practice Name:  Dr[facility] Phone number:  [phone] Fax number:  [phone]   Type of Clearance Requested:   - Pharmacy:  Hold Apixaban (Eliquis)     Type of Anesthesia:  Local    Additional requests/questions:  Please advise surgeon/provider what medications should be held.  Signed, Belisicia T Woods   06/21/2023, 12:22 PM

## 2023-06-21 NOTE — Telephone Encounter (Signed)
   Patient Name: Douglas Cox  DOB: 08-05-1950 MRN: [id number]  Primary Cardiologist: None  Clinical pharmacists have reviewed the patient's past medical history, labs, and current medications as part of preoperative protocol coverage. The following recommendations have been made:  Per office protocol, patient can hold Eliquis for 1 day prior to procedure.    I will route this recommendation to the requesting party via Epic fax function and remove from pre-op pool.  Please call with questions.  Napoleon Form, Leodis Rains, NP 06/21/2023, 1:21 PM

## 2023-06-21 NOTE — Telephone Encounter (Signed)
Patient with diagnosis of afib on Eliquis for anticoagulation.    Procedure: root canal Date of procedure: 06/23/23   CHA2DS2-VASc Score = 4   This indicates a 4.8% annual risk of stroke. The patient's score is based upon: CHF History: 0 HTN History: 1 Diabetes History: 0 Stroke History: 2 Vascular Disease History: 0 Age Score: 1 Gender Score: 0      CrCl 56 ml/min Platelet count 326   Per office protocol, patient can hold Eliquis for 1 day prior to procedure.    **This guidance is not considered finalized until pre-operative APP has relayed final recommendations.**

## 2023-06-22 ENCOUNTER — Telehealth: Payer: Self-pay | Admitting: Cardiology

## 2023-06-22 NOTE — Telephone Encounter (Signed)
Wife is wanting a call back clarifying holding medication. Please advise

## 2023-06-22 NOTE — Telephone Encounter (Signed)
Spoke with wife Jae Dire (Per DPR) and made her aware of Eliquis recommendations

## 2023-07-20 ENCOUNTER — Encounter: Payer: Medicare HMO | Attending: Physical Medicine & Rehabilitation | Admitting: Physical Medicine & Rehabilitation

## 2023-07-20 ENCOUNTER — Encounter: Payer: Self-pay | Admitting: Physical Medicine & Rehabilitation

## 2023-07-20 VITALS — BP 127/76 | HR 45 | Ht 70.0 in | Wt 142.0 lb

## 2023-07-20 DIAGNOSIS — I6381 Other cerebral infarction due to occlusion or stenosis of small artery: Secondary | ICD-10-CM | POA: Diagnosis not present

## 2023-07-20 DIAGNOSIS — M7501 Adhesive capsulitis of right shoulder: Secondary | ICD-10-CM | POA: Insufficient documentation

## 2023-07-20 NOTE — Progress Notes (Signed)
Subjective:    Patient ID: Douglas Cox, male    DOB: February 12, 1951, 72 y.o.   MRN: [id number]  HPI  Douglas Cox is here in follow up of his bg infarcts.   He reports an area of breakdown on his right big toe which has come on over the last few days. He was walking a little more the other day before it came on.  He might have been wearing the wrong shoes as well.  He completed therapies for his shoulders. He was doing well until he stopped doing his exercises.  Sometimes the shoulder can wake him up at night.  Douglas Cox completed the driving evaluation and he passed. He has been driving without issues.   He has noticed that he is moving his bowels only twice a week. He is taking extra fiber without a lot of benefit. He used miralax once last week.  Stools are of normal consistency and shape.  He had diarrhea previously.  Sleep is reasonable.  Mood is fair.  Appetite is good.    Pain Inventory Average Pain 0 Pain Right Now 6 My pain is constant and Tender area on right greater toe   LOCATION OF PAIN  shoulder, toe  BOWEL Number of stools per week: 2   BLADDER Normal    Mobility walk without assistance ability to climb steps?  yes do you drive?  yes  Function retired One hour Neuro/Psych No problems in this area  Prior Studies Any changes since last visit?  no  Physicians involved in your care Any changes since last visit?  no   Family History  Problem Relation Age of Onset   Colon cancer Neg Hx    Esophageal cancer Neg Hx    Rectal cancer Neg Hx    Stomach cancer Neg Hx    Social History   Socioeconomic History   Marital status: Married    Spouse name: Not on file   Number of children: Not on file   Years of education: Not on file   Highest education level: Not on file  Occupational History   Not on file  Tobacco Use   Smoking status: Former    Current packs/day: 0.00    Types: Cigarettes    Quit date: 01/30/2006    Years since quitting: 17.4    Smokeless tobacco: Never  Vaping Use   Vaping status: Never Used  Substance and Sexual Activity   Alcohol use: Not Currently    Alcohol/week: 2.0 standard drinks of alcohol    Types: 2 Shots of liquor per week   Drug use: Not Currently    Frequency: 7.0 times per week    Types: Marijuana    Comment: last use 04/03/23   Sexual activity: Not Currently  Other Topics Concern   Not on file  Social History Narrative   Not on file   Social Drivers of Health   Financial Resource Strain: Not on file  Food Insecurity: No Food Insecurity (07/21/2022)   Hunger Vital Sign    Worried About Running Out of Food in the Last Year: Never true    Ran Out of Food in the Last Year: Never true  Transportation Needs: No Transportation Needs (07/21/2022)   PRAPARE - Administrator, Civil Service (Medical): No    Lack of Transportation (Non-Medical): No  Physical Activity: Not on file  Stress: Not on file  Social Connections: Not on file   Past Surgical History:  Procedure Laterality Date  CATARACT EXTRACTION Bilateral    Dr. Cathey Endow   EYE SURGERY     FRACTURE SURGERY     GAS INSERTION Right 05/21/2021   Procedure: INSERTION OF GAS - C3F8;  Surgeon: Rennis Chris, MD;  Location[facility] OR;  Service: Ophthalmology;  Laterality: Right;   GAS/FLUID EXCHANGE Right 05/21/2021   Procedure: GAS/FLUID EXCHANGE;  Surgeon: Rennis Chris, MD;  Location: [facility] OR;  Service: Ophthalmology;  Laterality: Right;   HERNIA REPAIR     IR CT HEAD LTD  07/17/2022   IR PERCUTANEOUS ART THROMBECTOMY/INFUSION INTRACRANIAL INC DIAG ANGIO  07/17/2022   IR US GUIDE VASC ACCESS RIGHT  07/17/2022   PARS PLANA VITRECTOMY Right 05/21/2021   Procedure: PARS PLANA VITRECTOMY WITH 25 GAUGE;  Surgeon: Rennis Chris, MD;  Location: [facility] OR;  Service: Ophthalmology;  Laterality: Right;   PERFLUORONE INJECTION Right 05/21/2021   Procedure: PERFLUORONE INJECTION;  Surgeon: Rennis Chris, MD;  Location: [facility] OR;  Service:  Ophthalmology;  Laterality: Right;   PHOTOCOAGULATION WITH LASER Right 05/21/2021   Procedure: PHOTOCOAGULATION WITH LASER;  Surgeon: Rennis Chris, MD;  Location: [facility] OR;  Service: Ophthalmology;  Laterality: Right;   RADIOLOGY WITH ANESTHESIA N/A 07/17/2022   Procedure: IR WITH ANESTHESIA;  Surgeon: Radiologist, Medication, MD;  Location: [facility] OR;  Service: Radiology;  Laterality: N/A;   RETINAL DETACHMENT SURGERY     Past Medical History:  Diagnosis Date   Atrial fibrillation (HCC)    Celiac disease    COPD (chronic obstructive pulmonary disease) (HCC)    Retinal detachment    Stroke (HCC)    BP 127/76   Pulse (!) 45   Ht 5\' 10"  (1.778 m)   Wt 142 lb (64.4 kg)   SpO2 98%   BMI 20.37 kg/m   Opioid Risk Score:   Fall Risk Score:  `1  Depression screen [facility] 2/9     07/20/2023   12:59 PM 09/01/2022   11:43 AM  Depression screen PHQ 2/9  Decreased Interest 0 0  Down, Depressed, Hopeless 0 0  PHQ - 2 Score 0 0  Altered sleeping  1  Tired, decreased energy  0  Change in appetite  0  Feeling bad or failure about yourself   1  Trouble concentrating  1  Moving slowly or fidgety/restless  0  PHQ-9 Score  3    Review of Systems  Constitutional:        Weight loss  Gastrointestinal:  Positive for constipation.  Skin:  Negative for wound.       Tender area on right greater toe  All other systems reviewed and are negative.      Objective:   Physical Exam  General: No acute distress HEENT: NCAT, EOMI, oral membranes moist Cards: reg rate  Chest: normal effort Abdomen: Soft, NT, ND Skin: dry, intact, small blood blister right big toe along tip Extremities: no edema Psych: pleasant but a little flat. Neuro: Patient is alert and oriented x 3.  Improved language.  Processing speed insight and awareness are much improved as are short-term memory.  Strength is nearly 4+ to 5 out of 5 throughout.  Coordination is intact.  Romberg is negative.  He is able to stand on each leg  unsupported for a few seconds. Musculoskeletal: He has mild pain with internal and external rotation of the right shoulder but range of motion is nearly 90 degrees plus with abduction and 45 to 50 degrees with external and internal rotation.  Medical Problem List and Plan: 1. 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                -needs driver eval before going behind the wheel.  2.  Atrial fibrillation: eliquis per cards  -cardiology follow-up pending 3. Bilateral greater troch bursitis: improved             -ice, stretching, heat before stretching described             -voltaren gel 4.  Affect/sleep: improved as a whole          -have discussed sleep hygiene 5. Right frozen shoulder  -HEP  -no overhead weight lifting 6. Slow transit constipation:  -liquids are important, fiber, probiotic   -can try senna 7. Left perianal cyst/abscess---s/p draining by surgery   8.  Nutrition:               -we have discussed importance of regular exercise as well. His weight is stable today    9.  Hx of COVID:   10.Perianal cyst 11. HTN. Controlled off meds 12. May want to tape or pad big toe  -examine shoe wear.                  15 minutes of face to face patient care time were spent during this visit. All questions were encouraged and answered. Follow up with me prn

## 2023-07-20 NOTE — Patient Instructions (Addendum)
ALWAYS FEEL FREE TO CALL OUR OFFICE WITH ANY PROBLEMS OR QUESTIONS [phone])  **PLEASE NOTE** ALL MEDICATION REFILL REQUESTS (INCLUDING CONTROLLED SUBSTANCES) NEED TO BE MADE AT LEAST 7 DAYS PRIOR TO REFILL BEING DUE. ANY REFILL REQUESTS INSIDE THAT TIME FRAME MAY RESULT IN DELAYS IN RECEIVING YOUR PRESCRIPTION.    !!!!!!!HAPPY HOLIDAYS!!!!!!   For bowels:                 Senna- take 1 to 2 tabs at bedtime    For your shoulder Stretch your shoulder daily No lifting weights overhead

## 2023-08-01 ENCOUNTER — Other Ambulatory Visit (HOSPITAL_COMMUNITY): Payer: Self-pay

## 2023-08-05 ENCOUNTER — Other Ambulatory Visit (HOSPITAL_COMMUNITY): Payer: Self-pay

## 2023-08-31 ENCOUNTER — Ambulatory Visit: Payer: PPO | Attending: Physician Assistant | Admitting: Physician Assistant

## 2023-08-31 ENCOUNTER — Encounter (HOSPITAL_COMMUNITY): Payer: Self-pay

## 2023-08-31 ENCOUNTER — Other Ambulatory Visit: Payer: Self-pay | Admitting: Physical Medicine & Rehabilitation

## 2023-08-31 ENCOUNTER — Other Ambulatory Visit (HOSPITAL_COMMUNITY): Payer: Self-pay

## 2023-08-31 ENCOUNTER — Other Ambulatory Visit: Payer: Self-pay

## 2023-08-31 VITALS — BP 110/60 | HR 46 | Ht 70.0 in | Wt 142.0 lb

## 2023-08-31 DIAGNOSIS — I480 Paroxysmal atrial fibrillation: Secondary | ICD-10-CM | POA: Diagnosis not present

## 2023-08-31 DIAGNOSIS — R001 Bradycardia, unspecified: Secondary | ICD-10-CM | POA: Diagnosis not present

## 2023-08-31 DIAGNOSIS — D6869 Other thrombophilia: Secondary | ICD-10-CM

## 2023-08-31 NOTE — Patient Instructions (Signed)
Medication Instructions:   Your physician recommends that you continue on your current medications as directed. Please refer to the Current Medication list given to you today.  *If you need a refill on your cardiac medications before your next appointment, please call your pharmacy*   Lab Work: NONE ORDERED  TODAY   If you have labs (blood work) drawn today and your tests are completely normal, you will receive your results only by: MyChart Message (if you have MyChart) OR A paper copy in the mail If you have any lab test that is abnormal or we need to change your treatment, we will call you to review the results.   Testing/Procedures: NONE ORDERED  TODAY     Follow-Up: At [facility], you and your health needs are our priority.  As part of our continuing mission to provide you with exceptional heart care, we have created designated Provider Care Teams.  These Care Teams include your primary Cardiologist (physician) and Advanced Practice Providers (APPs -  Physician Assistants and Nurse Practitioners) who all work together to provide you with the care you need, when you need it.  We recommend signing up for the patient portal called "MyChart".  Sign up information is provided on this After Visit Summary.  MyChart is used to connect with patients for Virtual Visits (Telemedicine).  Patients are able to view lab/test results, encounter notes, upcoming appointments, etc.  Non-urgent messages can be sent to your provider as well.   To learn more about what you can do with MyChart, go to [email or web address].    Your next appointment:   6 month(s)  Provider:   You may see Lanier Prude, MD or one of the following Advanced Practice Providers on your designated Care Team:   Francis Dowse, [place] Other Instructions

## 2023-08-31 NOTE — Progress Notes (Signed)
Cardiology Office Note:  .   Date:  08/31/2023  ID:  Douglas Cox, DOB 1950-10-29, MRN [id number] PCP: [name], DO  Douglas Cox Cardiologist:  None Electrophysiologist:  Lanier Prude, MD {  History of Present Illness: .   Douglas Cox is a 73 y.o. male w/PMHx of retinal; detachment, celiac disease, stroke >> subsequently found to have AFib via zio monitoring.  Saw Dr. Lalla Brothers Feb 2024, recovering from his stroke, accompanied by his wife, exercising. Was pending dental implant > advised to hold off so soon after his CVA any procedure that would require interruption in his [facility]  Saw Douglas Cox 03/24/23, reported shoulder issues that were his primary limiter.  Reported resting HRs 40's exertional rates 90's via his watch SB 48 at this visit No symptoms of bradycardia No changes made  Today's visit is scheduled as a 6 mo visit  ROS:   He is accompanied by his wife Unfortunately with his shoulder issue/pain, he has become far less active then he had been Still going to the gym 2x/week, 10 min or so on the treadmill with some weight work as well Though his wife states otherwise spends hours and hours watching TV. Had been going on trails outside that he was enjoying but the cold weather has impacted that as well.  He is at baseline bradycardic, but sounds like his reduction in exertion activity is clearly since his shoulder injury/frozen shoulder (?) He denies dizziness, weak spells,  He had a syncopal episode back in August when visiting family, had stooped down to pick up his camera and upon standing back up felt really funny and fainted Got evaluated at the time and reportedly everything looked OK but told he was dehydrated and given IVF.  He drinks no water Only coffee or ginger ale (and a small whiskey at night, and occasionally marijuana) Has not had recurrent syncope  No CP, no SOB No bleeding or signs of bleeding He reports a couple  weeks ago an episode of Afib, lasted about 10 minutes, these are rare   Arrhythmia/AAD hx AFib found post stroke, Dec 2023  Studies Reviewed: [name]    EKG not done today   07/18/22: TTE 1. Left ventricular ejection fraction, by estimation, is 60 to 65%. The  left ventricle has normal function. Left ventricular endocardial border  not optimally defined to evaluate regional wall motion. Left ventricular  diastolic function could not be  evaluated.   2. Right ventricular systolic function is normal. The right ventricular  size is normal. Tricuspid regurgitation signal is inadequate for assessing  PA pressure.   3. The mitral valve was not well visualized. No evidence of mitral valve  regurgitation.   4. The aortic valve was not well visualized. Aortic valve regurgitation  is not visualized. No aortic stenosis is present.   5. Limited study stopped early by patient.   Comparison(s): No prior Echocardiogram.    Risk Assessment/Calculations:    Physical Exam:   VS:  There were no vitals taken for this visit.   Wt Readings from Last 3 Encounters:  07/20/23 142 lb (64.4 kg)  04/05/23 135 lb (61.2 kg)  03/29/23 135 lb (61.2 kg)    GEN: Well nourished, well developed in no acute distress NECK: No JVD; No carotid bruits CARDIAC: RRR, no murmurs, rubs, gallops RESPIRATORY:  CTA b/l without rales, wheezing or rhonchi  ABDOMEN: Soft, non-tender, non-distended EXTREMITIES:  No edema; No deformity   ASSESSMENT AND PLAN: .  Paroxysmal Afib CHA2DS2Vasc is 3, on Eliquis, appropriately dosed Low burden by symptoms  Secondary hypercoagulable state 2/2 AFib  3.  Sinus bradycardia seen for him A syncopal event in Aug 2024, attributed to dehydration, did occur upon standing Discussed importance of water Discussed symptoms of bradycardia If recurrent syncope to seek attention  Urged to increase physical activity to his ability, options for inside exercise that he could do while  watching TV      Dispo: back in 38mo, sooner if needed  Signed, [name], PA-C

## 2023-09-02 ENCOUNTER — Other Ambulatory Visit (HOSPITAL_COMMUNITY): Payer: Self-pay

## 2023-09-05 ENCOUNTER — Ambulatory Visit (INDEPENDENT_AMBULATORY_CARE_PROVIDER_SITE_OTHER): Payer: PPO | Admitting: Podiatry

## 2023-09-05 DIAGNOSIS — Z91199 Patient's noncompliance with other medical treatment and regimen due to unspecified reason: Secondary | ICD-10-CM

## 2023-09-05 NOTE — Progress Notes (Signed)
 No show

## 2023-09-06 ENCOUNTER — Other Ambulatory Visit (HOSPITAL_COMMUNITY): Payer: Self-pay

## 2023-09-06 ENCOUNTER — Telehealth: Payer: Self-pay | Admitting: Physical Medicine & Rehabilitation

## 2023-09-06 NOTE — Telephone Encounter (Signed)
Patient requesting refill on eliquis, if possible can a 60/90 day supply

## 2023-09-07 ENCOUNTER — Ambulatory Visit: Payer: PPO | Admitting: Pulmonary Disease

## 2023-09-08 ENCOUNTER — Other Ambulatory Visit (HOSPITAL_COMMUNITY): Payer: Self-pay

## 2023-09-08 ENCOUNTER — Telehealth: Payer: Self-pay | Admitting: Physical Medicine & Rehabilitation

## 2023-09-08 NOTE — Telephone Encounter (Signed)
Patient requesting refill for Eliquis 

## 2023-09-12 DIAGNOSIS — I2510 Atherosclerotic heart disease of native coronary artery without angina pectoris: Secondary | ICD-10-CM | POA: Insufficient documentation

## 2023-09-13 ENCOUNTER — Other Ambulatory Visit (HOSPITAL_COMMUNITY): Payer: Self-pay

## 2023-09-13 DIAGNOSIS — F0150 Vascular dementia without behavioral disturbance: Secondary | ICD-10-CM | POA: Insufficient documentation

## 2023-09-13 DIAGNOSIS — I7300 Raynaud's syndrome without gangrene: Secondary | ICD-10-CM | POA: Insufficient documentation

## 2023-09-13 MED ORDER — APIXABAN 5 MG PO TABS
5.0000 mg | ORAL_TABLET | Freq: Two times a day (BID) | ORAL | 2 refills | Status: DC
  Filled 2023-09-13 – 2023-10-10 (×3): qty 180, 90d supply, fill #0
  Filled 2023-12-19 – 2023-12-25 (×2): qty 180, 90d supply, fill #1
  Filled 2024-01-18 – 2024-03-27 (×4): qty 180, 90d supply, fill #2

## 2023-09-13 NOTE — Telephone Encounter (Signed)
Duplicate message.

## 2023-09-13 NOTE — Telephone Encounter (Signed)
90 day rx written.

## 2023-10-07 ENCOUNTER — Other Ambulatory Visit: Payer: Self-pay | Admitting: Gastroenterology

## 2023-10-08 ENCOUNTER — Other Ambulatory Visit (HOSPITAL_BASED_OUTPATIENT_CLINIC_OR_DEPARTMENT_OTHER): Payer: Self-pay

## 2023-10-08 ENCOUNTER — Other Ambulatory Visit (HOSPITAL_COMMUNITY): Payer: Self-pay

## 2023-10-10 ENCOUNTER — Other Ambulatory Visit: Payer: Self-pay

## 2023-10-10 ENCOUNTER — Other Ambulatory Visit (HOSPITAL_COMMUNITY): Payer: Self-pay

## 2023-10-10 MED ORDER — PANTOPRAZOLE SODIUM 40 MG PO TBEC
40.0000 mg | DELAYED_RELEASE_TABLET | Freq: Every day | ORAL | 1 refills | Status: DC
  Filled 2023-10-10 (×2): qty 90, 90d supply, fill #0
  Filled 2023-12-02 – 2023-12-25 (×3): qty 90, 90d supply, fill #1

## 2023-10-10 MED ORDER — FAMOTIDINE 20 MG PO TABS
20.0000 mg | ORAL_TABLET | Freq: Every day | ORAL | 1 refills | Status: DC
  Filled 2023-10-10 – 2023-12-02 (×3): qty 90, 90d supply, fill #0
  Filled 2023-12-19 – 2024-02-08 (×4): qty 90, 90d supply, fill #1

## 2023-10-11 ENCOUNTER — Ambulatory Visit: Admitting: Podiatry

## 2023-10-11 DIAGNOSIS — M7750 Other enthesopathy of unspecified foot: Secondary | ICD-10-CM

## 2023-10-13 NOTE — Progress Notes (Signed)
 [facility] - Clinic Note  10/25/2023     CHIEF COMPLAINT Patient presents for Retina Follow Up  HISTORY OF PRESENT ILLNESS: Douglas Cox is a 73 y.o. male who presents to the clinic today for:   HPI     Retina Follow Up   Patient presents with  Retinal Break/Detachment.  In right eye.  Since onset it is stable.  I, the attending physician,  performed the HPI with the patient and updated documentation appropriately.        Comments    Patient states vision doing ok. No problems. Not using drops.       Last edited by Rennis Chris, MD on 10/25/2023  9:24 PM.    Pt states his vision is doing well, he is back to driving after his stroke   Referring physician: [name], DO [address] [place],  [place] [address]  HISTORICAL INFORMATION:  Selected notes from the MEDICAL RECORD NUMBER Self-referral for eval of increased floaters/veil OS   CURRENT MEDICATIONS: No current outpatient medications on file. (Ophthalmic Drugs)   No current facility-administered medications for this visit. (Ophthalmic Drugs)   Current Outpatient Medications (Other)  Medication Sig   apixaban (ELIQUIS) 5 MG TABS tablet Take 1 tablet (5 mg total) by mouth 2 (two) times daily.   Cyanocobalamin (VITAMIN B-12) 5000 MCG SUBL Place 1 tablet under the tongue daily.   famotidine (PEPCID) 20 MG tablet Take 1 tablet (20 mg total) by mouth at bedtime.   magnesium oxide (MAG-OX) 400 (240 Mg) MG tablet Take 0.5 tablets (200 mg total) by mouth at bedtime.   pantoprazole (PROTONIX) 40 MG tablet Take 1 tablet (40 mg total) by mouth once daily each morning.   rosuvastatin (CRESTOR) 20 MG tablet Take 1 tablet (20 mg total) by mouth daily.   vitamin D3 (CHOLECALCIFEROL) 25 MCG tablet Take 1 tablet (1,000 Units total) by mouth daily.   traMADol (ULTRAM) 50 MG tablet Take 1 tablet (50 mg total) by mouth every 6 (six) hours as needed for pain (Patient not taking: Reported on  10/25/2023)   No current facility-administered medications for this visit. (Other)   REVIEW OF SYSTEMS: ROS   Positive for: Neurological, Cardiovascular, Eyes Negative for: Constitutional, Gastrointestinal, Skin, Genitourinary, Musculoskeletal, HENT, Endocrine, Respiratory, Psychiatric, Allergic/Imm, Heme/Lymph Last edited by Laddie Aquas, COA on 10/25/2023 12:59 PM.      ALLERGIES Allergies  Allergen Reactions   Nitrous Oxide Other (See Comments)    Can cause blindness in right eye    Codone [Hydrocodone] Hives   Tylenol [Acetaminophen] Hives   Hydrocodone-Acetaminophen     Other Reaction(s): itching/hives   Oxycodone Hcl     Other Reaction(s): itching/hives   Clarithromycin Anxiety    Other Reaction(s): agitated   Gluten Meal Other (See Comments)    Stomach issues   Lactose Intolerance (Gi) Other (See Comments)    GI issues   PAST MEDICAL HISTORY Past Medical History:  Diagnosis Date   Atrial fibrillation (HCC)    Celiac disease    COPD (chronic obstructive pulmonary disease) (HCC)    Retinal detachment    Stroke [facility])    Past Surgical History:  Procedure Laterality Date   CATARACT EXTRACTION Bilateral    Dr. Cathey Endow   EYE SURGERY     FRACTURE SURGERY     GAS INSERTION Right 05/21/2021   Procedure: INSERTION OF GAS - C3F8;  Surgeon: Rennis Chris, MD;  Location: [facility] OR;  Service: Ophthalmology;  Laterality: Right;   GAS/FLUID EXCHANGE Right 05/21/2021   Procedure: GAS/FLUID EXCHANGE;  Surgeon: Rennis Chris, MD;  Location[facility] OR;  Service: Ophthalmology;  Laterality: Right;   HERNIA REPAIR     IR CT HEAD LTD  07/17/2022   IR PERCUTANEOUS ART THROMBECTOMY/INFUSION INTRACRANIAL INC DIAG ANGIO  07/17/2022   IR US GUIDE VASC ACCESS RIGHT  07/17/2022   PARS PLANA VITRECTOMY Right 05/21/2021   Procedure: PARS PLANA VITRECTOMY WITH 25 GAUGE;  Surgeon: Rennis Chris, MD;  Location: [facility] OR;  Service: Ophthalmology;  Laterality: Right;   PERFLUORONE INJECTION Right  05/21/2021   Procedure: PERFLUORONE INJECTION;  Surgeon: Rennis Chris, MD;  Location: [facility] OR;  Service: Ophthalmology;  Laterality: Right;   PHOTOCOAGULATION WITH LASER Right 05/21/2021   Procedure: PHOTOCOAGULATION WITH LASER;  Surgeon: Rennis Chris, MD;  Location: [facility] OR;  Service: Ophthalmology;  Laterality: Right;   RADIOLOGY WITH ANESTHESIA N/A 07/17/2022   Procedure: IR WITH ANESTHESIA;  Surgeon: Radiologist, Medication, MD;  Location: [facility] OR;  Service: Radiology;  Laterality: N/A;   RETINAL DETACHMENT SURGERY     FAMILY HISTORY Family History  Problem Relation Age of Onset   Colon cancer Neg Hx    Esophageal cancer Neg Hx    Rectal cancer Neg Hx    Stomach cancer Neg Hx     SOCIAL HISTORY Social History   Tobacco Use   Smoking status: Former    Current packs/day: 0.00    Types: Cigarettes    Quit date: 01/30/2006    Years since quitting: 17.7   Smokeless tobacco: Never  Vaping Use   Vaping status: Never Used  Substance Use Topics   Alcohol use: Not Currently    Alcohol/week: 2.0 standard drinks of alcohol    Types: 2 Shots of liquor per week   Drug use: Not Currently    Frequency: 7.0 times per week    Types: Marijuana    Comment: last use 04/03/23       OPHTHALMIC EXAM: Base Eye Exam     Visual Acuity (Snellen - Linear)       Right Left   Dist cc 20/20 -1 20/20    Correction: Glasses         Tonometry (Tonopen, 12:56 PM)       Right Left   Pressure 16 14         Pupils       Dark Light Shape React APD   Right 5 4 Round Brisk None   Left 4 3 Round Brisk None         Visual Fields (Counting fingers)       Left Right    Full Full         Extraocular Movement       Right Left    Full, Ortho Full, Ortho         Neuro/Psych     Oriented x3: Yes   Mood/Affect: Normal         Dilation     Both eyes: 1.0% Mydriacyl, 2.5% Phenylephrine @ 12:56 PM           Slit Lamp and Fundus Exam     Slit Lamp Exam       Right Left    Lids/Lashes Dermatochalasis - upper lid Dermatochalasis - upper lid   Conjunctiva/Sclera White and quiet White and quiet   Cornea Arcus, well healed temporal cataract wounds, trace tear film debris arcus, well healed temporal cataract wounds, trace PEE  Anterior Chamber deep and clear deep and clear   Iris Round and dilated Round and dilated   Lens PC IOL in excellent position, 1+Posterior capsular opacification PC IOL in good position, trace Posterior capsular opacification   Anterior Vitreous Post vitrectomy mild syneresis, no pigment, Posterior vitreous detachment, promin, vitreous condensations settled inferiorly         Fundus Exam       Right Left   Disc Pink and Sharp, Compact Pink and Sharp, Compact, mild tilt   C/D Ratio 0.2 0.2   Macula Flat, Blunted foveal reflex, trace ERM, RPE mottling, No heme or edema Flat, Blunted foveal reflex, mild RPE mottling and clumping, No heme or edema   Vessels attenuated, mild tortuosity attenuated, mild tortuosity   Periphery Retina re-attached; 360 peripheral laser changes. ORIGINALLY: Bullous nasal RD from 1230-0630 with small HST at 0130; good cryo changes around 0130 tear Attached, mild pigmented paving stone degeneration IT quad, No RT/RD, No heme           Refraction     Wearing Rx       Sphere Cylinder Axis Add   Right +0.25 Sphere  +2.50   Left -0.50 +0.75 117 +2.50           IMAGING AND PROCEDURES  Imaging and Procedures for 10/25/2023  OCT, Retina - OU - Both Eyes       Right Eye Quality was good. Central Foveal Thickness: 287. Progression has been stable. Findings include normal foveal contour, no IRF, no SRF, myopic contour, epiretinal membrane (Inferior retina stably re-attached, trace ERM).   Left Eye Quality was good. Central Foveal Thickness: 268. Progression has been stable. Findings include normal foveal contour, no IRF, no SRF (Trace ERM).   Notes *Images captured and stored on drive  Diagnosis /  Impression:  OD: NFP, no IRF; Inferior retina stably re-attached, tr ERM OS: NFP, no IRF/SRF  Clinical management:  See below  Abbreviations: NFP - Normal foveal profile. CME - cystoid macular edema. PED - pigment epithelial detachment. IRF - intraretinal fluid. SRF - subretinal fluid. EZ - ellipsoid zone. ERM - epiretinal membrane. ORA - outer retinal atrophy. ORT - outer retinal tubulation. SRHM - subretinal hyper-reflective material. IRHM - intraretinal hyper-reflective material            ASSESSMENT/PLAN:   ICD-10-CM   1. Right retinal detachment  H33.21 OCT, Retina - OU - Both Eyes    2. Posterior vitreous detachment of left eye  H43.812     3. Pseudophakia, both eyes  Z96.1     4. Dry eyes  H04.123     5. 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 (HCC)  I63.81      1. 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 PVD OS  - acute development of prominent floaters OS -- saw Dr. Dione Booze on call on Saturday, 10.16.21  - prominent floaters / vitreous condensations remain settled inferiorly  - Discussed findings and prognosis   - No RT or RD on 360 scleral depressed exam  - Reviewed s/s of RT/RD  - Strict return precautions for any such RT/RD signs/symptoms   - f/u in 1 year -- DFE/OCT  3. Pseudophakia OU  - s/p CE/IOL OU (Dr. Cathey Endow)  - IOLs in good position, doing well  - mild PCO OU  - monitor   4. Dry eyes OU - recommend  artificial tears and lubricating ointment as needed  5. History of CVA  - HVF 30-2 performed 09.11.24  - mild superior paracentral defects OU -- not likely related to history of CVA  Ophthalmic Meds Ordered this visit:  No orders of the defined types were placed in this encounter.    Return in about 1 year (around 10/24/2024) for f/u RD OD, DFE, OCT.  There are no Patient Instructions on file for this visit.   This document serves as a record of services personally performed by Karie Chimera, MD, PhD. It was created on their behalf by Charlette Caffey, COT an ophthalmic technician. The creation of this record is the provider's dictation and/or activities during the visit.    Electronically signed by:  Charlette Caffey, COT  10/25/23 9:25 PM   Karie Chimera, M.D., Ph.D. Diseases & Surgery of the Retina and Vitreous [facility]  I have reviewed the above documentation for accuracy and completeness, and I agree with the above. Karie Chimera, M.D., Ph.D. 10/25/23 9:26 PM   Abbreviations: M myopia (nearsighted); A astigmatism; H hyperopia (farsighted); P presbyopia; Mrx spectacle prescription;  CTL contact lenses; OD right eye; OS left eye; OU both eyes  XT exotropia; ET esotropia; PEK punctate epithelial keratitis; PEE punctate epithelial erosions; DES dry eye syndrome; MGD meibomian gland dysfunction; ATs artificial tears; PFAT's preservative free artificial tears; NSC nuclear sclerotic cataract; PSC posterior subcapsular cataract; ERM epi-retinal membrane; PVD posterior vitreous detachment; RD retinal detachment; DM diabetes mellitus; DR diabetic retinopathy; NPDR non-proliferative diabetic retinopathy; PDR proliferative diabetic retinopathy; CSME clinically significant macular edema; DME diabetic macular edema; dbh dot blot hemorrhages; CWS cotton wool spot; POAG primary open angle glaucoma; C/D cup-to-disc ratio; HVF humphrey visual field; GVF goldmann  visual field; OCT optical coherence tomography; IOP intraocular pressure; BRVO Branch retinal vein occlusion; CRVO central retinal vein occlusion; CRAO central retinal artery occlusion; BRAO branch retinal artery occlusion; RT retinal tear; SB scleral buckle; PPV pars plana vitrectomy; VH Vitreous hemorrhage; PRP panretinal laser photocoagulation; IVK intravitreal kenalog; VMT vitreomacular traction; MH Macular hole;  NVD neovascularization of the disc; NVE neovascularization elsewhere; AREDS age related eye disease study; ARMD age related macular degeneration; POAG primary open angle glaucoma; EBMD epithelial/anterior basement membrane dystrophy; ACIOL anterior chamber intraocular lens; IOL intraocular lens; PCIOL posterior chamber intraocular lens; Phaco/IOL phacoemulsification with intraocular lens placement; PRK photorefractive keratectomy; LASIK laser assisted in situ keratomileusis; HTN hypertension; DM diabetes mellitus; COPD chronic obstructive pulmonary disease

## 2023-10-25 ENCOUNTER — Ambulatory Visit (INDEPENDENT_AMBULATORY_CARE_PROVIDER_SITE_OTHER): Payer: Medicare HMO | Admitting: Ophthalmology

## 2023-10-25 ENCOUNTER — Encounter (INDEPENDENT_AMBULATORY_CARE_PROVIDER_SITE_OTHER): Payer: Self-pay | Admitting: Ophthalmology

## 2023-10-25 DIAGNOSIS — Z961 Presence of intraocular lens: Secondary | ICD-10-CM

## 2023-10-25 DIAGNOSIS — H3321 Serous retinal detachment, right eye: Secondary | ICD-10-CM

## 2023-10-25 DIAGNOSIS — H04123 Dry eye syndrome of bilateral lacrimal glands: Secondary | ICD-10-CM | POA: Diagnosis not present

## 2023-10-25 DIAGNOSIS — H43812 Vitreous degeneration, left eye: Secondary | ICD-10-CM

## 2023-10-25 DIAGNOSIS — I6381 Other cerebral infarction due to occlusion or stenosis of small artery: Secondary | ICD-10-CM

## 2023-11-14 ENCOUNTER — Other Ambulatory Visit: Payer: Self-pay | Admitting: Physical Medicine & Rehabilitation

## 2023-11-15 ENCOUNTER — Other Ambulatory Visit (HOSPITAL_COMMUNITY): Payer: Self-pay

## 2023-11-15 MED ORDER — ROSUVASTATIN CALCIUM 20 MG PO TABS
20.0000 mg | ORAL_TABLET | Freq: Every day | ORAL | 7 refills | Status: DC
  Filled 2023-11-15: qty 30, 30d supply, fill #0
  Filled 2023-12-02 – 2023-12-19 (×2): qty 30, 30d supply, fill #1
  Filled 2023-12-25 – 2024-01-18 (×2): qty 30, 30d supply, fill #2
  Filled 2024-02-04 – 2024-02-08 (×2): qty 30, 30d supply, fill #3
  Filled 2024-03-19 – 2024-03-27 (×2): qty 30, 30d supply, fill #4
  Filled 2024-05-09 (×2): qty 90, 90d supply, fill #5

## 2023-11-21 ENCOUNTER — Ambulatory Visit: Payer: Self-pay | Admitting: General Surgery

## 2023-11-21 NOTE — H&P (Signed)
 REFERRING PHYSICIAN:  Rachael Budd, DO  PROVIDER:  Denese Finn, MD  MRN: [id number] DOB: [birth year] DATE OF ENCOUNTER: 11/21/2023  Subjective   Chief Complaint: No chief complaint on file.     History of Present Illness: .  73 y.o. male with A-fib on Eliquis , who underwent bedside I&D of LEFT sided perianal abscess about 1 year ago in April 2024.  A small amount of dark thin purulent drainage was evacuated at that time.  He then presented to urgent office ~1 yr later stating that the area never fully healed.  He states it is slightly swollen and the size has not changed much.  He denies drainage but does have occasional pain.    Review of Systems: A complete review of systems was obtained from the patient.  I have reviewed this information and discussed as appropriate with the patient.  See HPI as well for other ROS.   Medical History: Past Medical History:  Diagnosis Date   Arthritis    GERD (gastroesophageal reflux disease)    History of stroke     Patient Active Problem List  Diagnosis   Alcohol dependence (CMS/HHS-HCC)   Paroxysmal atrial fibrillation (CMS/HHS-HCC)   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 (CMS/HHS-HCC)    Past Surgical History:  Procedure Laterality Date   HIP-LABRAL TEAR       Allergies  Allergen Reactions   Clarithromycin Anxiety   Gluten Other (See Comments)    STOMACH ISSUES   Hydrocodone Hives   Hydrocodone-Acetaminophen  Itching    Other Reaction(s): itching/hives   Nitrous Oxide Other (See Comments)    CAN CAUSE BLINDNESS IN RT EYE   Oxycodone Hives and Itching   Tylenol  [Acetaminophen ] Hives   Lactose Other (See Comments)    GI ISSUES  GI issues    Current Outpatient Medications on File Prior to Visit  Medication Sig Dispense Refill   apixaban  (ELIQUIS ) 5 mg tablet Take 5 mg by mouth 2 (two) times daily     cholecalciferol  (VITAMIN D3) 1000 unit tablet Take by mouth     cyanocobalamin  (VITAMIN B12)  1,000 mcg/mL injection Inject into the muscle monthly     famotidine  (PEPCID ) 20 MG tablet Take 20 mg by mouth at bedtime     linaCLOtide  (LINZESS ) 72 mcg capsule Take by mouth (Patient not taking: Reported on 11/21/2023)     pantoprazole  (PROTONIX ) 40 MG DR tablet Take by mouth     rosuvastatin  (CRESTOR ) 20 MG tablet Take 20 mg by mouth once daily     No current facility-administered medications on file prior to visit.    History reviewed. No pertinent family history.   Social History   Tobacco Use  Smoking Status Former   Types: Cigarettes  Smokeless Tobacco Never     Social History   Socioeconomic History   Marital status: Married  Tobacco Use   Smoking status: Former    Types: Cigarettes   Smokeless tobacco: Never  Vaping Use   Vaping status: Never Used  Substance and Sexual Activity   Alcohol use: Yes   Drug use: Never   Social Drivers of Architectural technologist Insecurity: No Food Insecurity (07/21/2022)   Received from [facility]   Hunger Vital Sign    Worried About Running Out of Food in the Last Year: Never true    Ran Out of Food in the Last Year: Never true  Transportation Needs: No Transportation Needs (07/21/2022)   Received from [facility] -  Administrator, Civil Service (Medical): No    Lack of Transportation (Non-Medical): No  Housing Stability: Unknown (10/20/2023)   Housing Stability Vital Sign    Homeless in the Last Year: No    Objective:    Vitals:   11/21/23 1115  PainSc: 0-No pain     Exam Gen: NAD Abd: soft Rectal: LA external opening with palpable cord   Assessment and Plan:  Diagnoses and all orders for this visit:  Anal fistula     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  We discussed proceeding with exam under anesthesia and possible fistulotomy versus seton placement.  We discussed both procedures in detail as well as recurrence rates and need for additional surgery if seton is placed.  All questions were  answered.  Fernande Howells, MD Colon and Rectal Surgery [facility]

## 2023-12-02 ENCOUNTER — Other Ambulatory Visit (HOSPITAL_COMMUNITY): Payer: Self-pay

## 2023-12-02 ENCOUNTER — Other Ambulatory Visit: Payer: Self-pay

## 2023-12-12 ENCOUNTER — Emergency Department (HOSPITAL_COMMUNITY)

## 2023-12-12 ENCOUNTER — Other Ambulatory Visit: Payer: Self-pay

## 2023-12-12 ENCOUNTER — Encounter (HOSPITAL_COMMUNITY): Payer: Self-pay

## 2023-12-12 ENCOUNTER — Emergency Department (HOSPITAL_COMMUNITY)
Admission: EM | Admit: 2023-12-12 | Discharge: 2023-12-12 | Disposition: A | Discharge status: Home / Self Care | Attending: Student | Admitting: Student

## 2023-12-12 DIAGNOSIS — Z7901 Long term (current) use of anticoagulants: Secondary | ICD-10-CM | POA: Insufficient documentation

## 2023-12-12 DIAGNOSIS — R55 Syncope and collapse: Secondary | ICD-10-CM | POA: Diagnosis present

## 2023-12-12 DIAGNOSIS — I4891 Unspecified atrial fibrillation: Secondary | ICD-10-CM | POA: Diagnosis not present

## 2023-12-12 DIAGNOSIS — J449 Chronic obstructive pulmonary disease, unspecified: Secondary | ICD-10-CM | POA: Insufficient documentation

## 2023-12-12 DIAGNOSIS — F1210 Cannabis abuse, uncomplicated: Secondary | ICD-10-CM | POA: Diagnosis not present

## 2023-12-12 LAB — CBC WITH DIFFERENTIAL/PLATELET
Abs Immature Granulocytes: 0.02 10*3/uL (ref 0.00–0.07)
Basophils Absolute: 0 10*3/uL (ref 0.0–0.1)
Basophils Relative: 1 %
Eosinophils Absolute: 0 10*3/uL (ref 0.0–0.5)
Eosinophils Relative: 1 %
HCT: 40.8 % (ref 39.0–52.0)
Hemoglobin: 13.2 g/dL (ref 13.0–17.0)
Immature Granulocytes: 0 %
Lymphocytes Relative: 11 %
Lymphs Abs: 0.7 10*3/uL (ref 0.7–4.0)
MCH: 32.4 pg (ref 26.0–34.0)
MCHC: 32.4 g/dL (ref 30.0–36.0)
MCV: 100.2 fL — ABNORMAL HIGH (ref 80.0–100.0)
Monocytes Absolute: 0.5 10*3/uL (ref 0.1–1.0)
Monocytes Relative: 8 %
Neutro Abs: 4.8 10*3/uL (ref 1.7–7.7)
Neutrophils Relative %: 79 %
Platelets: 202 10*3/uL (ref 150–400)
RBC: 4.07 MIL/uL — ABNORMAL LOW (ref 4.22–5.81)
RDW: 13.1 % (ref 11.5–15.5)
WBC: 6 10*3/uL (ref 4.0–10.5)
nRBC: 0 % (ref 0.0–0.2)

## 2023-12-12 LAB — BASIC METABOLIC PANEL WITH GFR
Anion gap: 7 (ref 5–15)
BUN: 16 mg/dL (ref 8–23)
CO2: 25 mmol/L (ref 22–32)
Calcium: 8.4 mg/dL — ABNORMAL LOW (ref 8.9–10.3)
Chloride: 107 mmol/L (ref 98–111)
Creatinine, Ser: 1.02 mg/dL (ref 0.61–1.24)
GFR, Estimated: >60 mL/min (ref >60)
Glucose, Bld: 178 mg/dL — ABNORMAL HIGH (ref 70–99)
Potassium: 4 mmol/L (ref 3.5–5.1)
Sodium: 139 mmol/L (ref 135–145)

## 2023-12-12 LAB — CBG MONITORING, ED
Glucose-Capillary: 52 mg/dL — ABNORMAL LOW (ref 70–99)
Glucose-Capillary: 149 mg/dL — ABNORMAL HIGH (ref 70–99)

## 2023-12-12 LAB — TROPONIN I (HIGH SENSITIVITY): Troponin I (High Sensitivity): 5 ng/L (ref <18)

## 2023-12-12 NOTE — ED Provider Triage Note (Signed)
 Emergency Medicine Provider Triage Evaluation Note  Douglas Cox , a 73 y.o. male  was evaluated in triage.  Pt complains of for evaluation of syncope.  States he was at home with his wife smoking when he got sudden onset dizziness and felt like it was "coming on."  He states he has a prior history of syncope cannot remember last time.  No seizure-like activity.  He felt lightheaded, dizzy.  No chest pain or shortness of breath.  Is not sure if he hit the ground.  Review of Systems  Positive: syncope Negative:   Physical Exam  There were no vitals taken for this visit. Gen:   Awake, no distress   Resp:  Normal effort  MSK:   Moves extremities without difficulty  Other:    Medical Decision Making  Medically screening exam initiated at 6:01 PM.  Appropriate orders placed.  Dominion Everson was informed that the remainder of the evaluation will be completed by another provider, this initial triage assessment does not replace that evaluation, and the importance of remaining in the ED until their evaluation is complete.  Syncope  Low CBG, no hx of DB. Will get replacement    Alaysia Lightle A, PA-C 12/12/23 1802

## 2023-12-12 NOTE — ED Provider Notes (Signed)
 [place] EMERGENCY DEPARTMENT AT [facility] Provider Note  CSN: [id number] Arrival date & time: 12/12/23 1751  Chief Complaint(s) Loss of Consciousness (Pt arrives BIB EMS today for witnessed syncopal episode at home. Was smoking pot and began to feel funny, had syncopal episode lowered to ground by wife, no headstrike, on thinners hx afib. Initial pressure for ems sys 100s, upon standing 90/60. Given 500 fluids, cbg here 53. Bsl aphasia dt prior stroke L sided weakness bsl. 20GRAC. No nausea, some dizziness before syncopal episode)  HPI Douglas Cox is a 73 y.o. male with PMH paroxysmal A-fib, COPD, previous CVA who presents emergency room for evaluation of a syncopal episode.  Patient states that he was in the kitchen smoking marijuana when he started to feel very lightheaded and suffered a syncopal episode.  His wife caught him and lowered him to the ground and thus patient did not have a head strike.  Here in the emergency room, patient is asymptomatic and requesting to go home.  He denies chest pain, shortness of breath, abdominal pain, nausea, vomiting or other systemic symptoms.   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   Esophageal dysphagia 03/22/2023   Prolapsed internal hemorrhoids 03/22/2023   Weight loss 03/22/2023   Perianal cyst 11/03/2022   Trochanteric bursitis of both hips 11/03/2022   Paroxysmal atrial fibrillation (HCC) 09/01/2022   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 (HCC) 07/22/2022   Protein-calorie malnutrition, severe 07/19/2022   Stroke (cerebrum) (HCC) 07/17/2022   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 Authorizing Provider  apixaban  (ELIQUIS ) 5 MG TABS tablet Take 1 tablet (5 mg total) by mouth 2 (two) times daily. 09/13/23   Rawland Caddy, MD  Cyanocobalamin  (VITAMIN B-12) 5000 MCG SUBL Place 1 tablet under the tongue daily.    [provider]  famotidine  (PEPCID ) 20 MG tablet Take 1 tablet (20 mg total) by mouth at bedtime. 10/10/23   Nandigam, Kavitha V, MD  magnesium  oxide (MAG-OX) 400 (240 Mg) MG tablet Take 0.5 tablets (200 mg total) by mouth at bedtime. 08/27/22   Rawland Caddy, MD  pantoprazole  (PROTONIX ) 40 MG tablet Take 1 tablet (40 mg total) by mouth once daily each morning. 10/10/23   Nandigam, Kavitha V, MD  rosuvastatin  (CRESTOR ) 20 MG tablet Take 1 tablet (20 mg total) by mouth daily. 11/15/23   Rawland Caddy, MD  traMADol  (ULTRAM ) 50 MG tablet Take 1 tablet (50 mg total) by mouth every 6 (six) hours as needed for pain Patient not taking: Reported on 10/25/2023 09/27/22     vitamin D3 (CHOLECALCIFEROL ) 25 MCG tablet Take 1 tablet (1,000 Units total) by mouth daily. 08/27/22   Rawland Caddy, MD  Past Surgical History Past Surgical History:  Procedure Laterality Date   CATARACT EXTRACTION Bilateral    Dr. Ambrosio Junker   EYE SURGERY     FRACTURE SURGERY     GAS INSERTION Right 05/21/2021   Procedure: INSERTION OF GAS - C3F8;  Surgeon: Ronelle Coffee, MD;  Location: [facility] OR;  Service: Ophthalmology;  Laterality: Right;   GAS/FLUID EXCHANGE Right 05/21/2021   Procedure: GAS/FLUID EXCHANGE;  Surgeon: Ronelle Coffee, MD;  Location: [facility] OR;  Service: Ophthalmology;  Laterality: Right;   HERNIA REPAIR     IR CT HEAD LTD  07/17/2022   IR PERCUTANEOUS ART THROMBECTOMY/INFUSION INTRACRANIAL INC DIAG ANGIO   07/17/2022   IR US  GUIDE VASC ACCESS RIGHT  07/17/2022   PARS PLANA VITRECTOMY Right 05/21/2021   Procedure: PARS PLANA VITRECTOMY WITH 25 GAUGE;  Surgeon: Ronelle Coffee, MD;  Location: [facility] OR;  Service: Ophthalmology;  Laterality: Right;   PERFLUORONE INJECTION Right 05/21/2021   Procedure: PERFLUORONE INJECTION;  Surgeon: Ronelle Coffee, MD;  Location: [facility] OR;  Service: Ophthalmology;  Laterality: Right;   PHOTOCOAGULATION WITH LASER Right 05/21/2021   Procedure: PHOTOCOAGULATION WITH LASER;  Surgeon: Ronelle Coffee, MD;  Location: [facility] OR;  Service: Ophthalmology;  Laterality: Right;   RADIOLOGY WITH ANESTHESIA N/A 07/17/2022   Procedure: IR WITH ANESTHESIA;  Surgeon: Radiologist, Medication, MD;  Location: [facility] OR;  Service: Radiology;  Laterality: N/A;   RETINAL DETACHMENT SURGERY     Family History Family History  Problem Relation Age of Onset   Colon cancer Neg Hx    Esophageal cancer Neg Hx    Rectal cancer Neg Hx    Stomach cancer Neg Hx     Social History Social History   Tobacco Use   Smoking status: Former    Current packs/day: 0.00    Types: Cigarettes    Quit date: 01/30/2006    Years since quitting: 17.8   Smokeless tobacco: Never  Vaping Use   Vaping status: Never Used  Substance Use Topics   Alcohol use: Not Currently    Alcohol/week: 2.0 standard drinks of alcohol    Types: 2 Shots of liquor per week   Drug use: Not Currently    Frequency: 7.0 times per week    Types: Marijuana    Comment: last use 04/03/23   Allergies Nitrous oxide, Codone [hydrocodone], Tylenol  [acetaminophen ], Hydrocodone-acetaminophen , Lactose, Oxycodone hcl, Clarithromycin, Codeine, Gluten meal, Lactose intolerance (gi), and Oxycodone  Review of Systems Review of Systems  Neurological:  Positive for syncope.    Physical Exam Vital Signs  I have reviewed the triage vital signs BP 135/80   Pulse (!) 51   Temp 97.7 F (36.5 C)   Resp 17   Ht 5\' 10"  (1.778 m)   Wt 64.4 kg   SpO2 95%    BMI 20.37 kg/m   Physical Exam Constitutional:      General: He is not in acute distress.    Appearance: Normal appearance.  HENT:     Head: Normocephalic and atraumatic.     Nose: No congestion or rhinorrhea.  Eyes:     General:        Right eye: No discharge.        Left eye: No discharge.     Extraocular Movements: Extraocular movements intact.     Pupils: Pupils are equal, round, and reactive to light.  Cardiovascular:     Rate and Rhythm: Regular rhythm. Bradycardia present.     Heart sounds: No murmur heard. Pulmonary:  Effort: No respiratory distress.     Breath sounds: No wheezing or rales.  Abdominal:     General: There is no distension.     Tenderness: There is no abdominal tenderness.  Musculoskeletal:        General: Normal range of motion.     Cervical back: Normal range of motion.  Skin:    General: Skin is warm and dry.  Neurological:     General: No focal deficit present.     Mental Status: He is alert.     ED Results and Treatments Labs (all labs ordered are listed, but only abnormal results are displayed) Labs Reviewed  CBC WITH DIFFERENTIAL/PLATELET - Abnormal; Notable for the following components:      Result Value   RBC 4.07 (*)    MCV 100.2 (*)    All other components within normal limits  BASIC METABOLIC PANEL WITH GFR - Abnormal; Notable for the following components:   Glucose, Bld 178 (*)    Calcium  8.4 (*)    All other components within normal limits  CBG MONITORING, ED - Abnormal; Notable for the following components:   Glucose-Capillary 52 (*)    All other components within normal limits  CBG MONITORING, ED - Abnormal; Notable for the following components:   Glucose-Capillary 149 (*)    All other components within normal limits  TROPONIN I (HIGH SENSITIVITY)  TROPONIN I (HIGH SENSITIVITY)                                                                                                                          Radiology CT HEAD  WO CONTRAST ( ) Result Date: 12/12/2023 CLINICAL DATA:  Vertigo EXAM: CT HEAD WITHOUT CONTRAST TECHNIQUE: Contiguous axial images were obtained from the base of the skull through the vertex without intravenous contrast. RADIATION DOSE REDUCTION: This exam was performed according to the departmental dose-optimization program which includes automated exposure control, adjustment of the mA and/or kV according to patient size and/or use of iterative reconstruction technique. COMPARISON:  07/17/2022, MRI from 07/18/2022 FINDINGS: Brain: There is an area of encephalomalacia identified in the right temporal lobe laterally. Similar changes are noted in the head of the right caudate nucleus as well as the basal ganglia bilaterally. These areas are consistent with prior ischemia. No acute hemorrhage or acute infarct is noted. Vascular: No hyperdense vessel or unexpected calcification. Skull: Normal. Negative for fracture or focal lesion. Sinuses/Orbits: No acute finding. Other: None. IMPRESSION: Prior infarcts in the right caudate nucleus, bilateral basal ganglia and tip of the right temporal lobe similar to that seen on prior MRI. No acute abnormality is noted. Electronically Signed   By: Violeta Grey M.D.   On: 12/12/2023 19:39   DG Chest 2 View Result Date: 12/12/2023 CLINICAL DATA:  Syncope. EXAM: CHEST - 2 VIEW COMPARISON:  07/20/2022 and CT chest 08/27/2022. FINDINGS: Trachea is midline. Heart size stable. Lungs are clear. No pleural fluid. Degenerative changes in the spine. IMPRESSION:  No acute findings. Electronically Signed   By: Shearon Denis M.D.   On: 12/12/2023 19:03    Pertinent labs & imaging results that were available during my care of the patient were reviewed by me and considered in my medical decision making (see MDM for details).  Medications Ordered in ED Medications - No data to display                                                                                                                                    Procedures Procedures  (including critical care time)  Medical Decision Making / ED Course   This patient presents to the ED for concern of syncope, this involves an extensive number of treatment options, and is a complaint that carries with it a high risk of complications and morbidity.  The differential diagnosis includes orthostatic syncope, cardiogenic syncope, vasovagal syncope, electrolyte abnormality, dehydration, dysrhythmia, vasovagal, Hypoglycemia, Seizure, Autonomic Insufficiency  MDM: Patient returned for evaluation of a syncopal episode.  Physical exam with a regular bradycardia but is otherwise unremarkable.  Laboratory evaluation with an initial blood glucose of 52 but this improved after oral glucose repletion.  Laboratory evaluation otherwise unremarkable including negative high-sensitivity troponin.  CT head with redemonstration of his old strokes but is otherwise unremarkable.  Chest x-ray unremarkable.  ECG with sinus bradycardia with no evidence of heart block.  Suspect patient likely had a vagal event after smoking marijuana and given his baseline sinus bradycardia likely did not have much reserve and this progressed to a syncopal episode.  Orthostatics were repeated here in the emergency room and after fluid resuscitation and his symptoms have significant improved.  No additional syncopal episodes here in the ER.  At this time he does not meet inpatient criteria for admission and will be discharged with outpatient follow-up.  I did encourage the patient to eat a normal diet and refrain from smoking marijuana in the future.   Additional history obtained: -Additional history obtained from wife -External records from outside source obtained and reviewed including: Chart review including previous notes, labs, imaging, consultation notes   Lab Tests: -I ordered, reviewed, and interpreted labs.   The pertinent results include:   Labs Reviewed  CBC  WITH DIFFERENTIAL/PLATELET - Abnormal; Notable for the following components:      Result Value   RBC 4.07 (*)    MCV 100.2 (*)    All other components within normal limits  BASIC METABOLIC PANEL WITH GFR - Abnormal; Notable for the following components:   Glucose, Bld 178 (*)    Calcium  8.4 (*)    All other components within normal limits  CBG MONITORING, ED - Abnormal; Notable for the following components:   Glucose-Capillary 52 (*)    All other components within normal limits  CBG MONITORING, ED - Abnormal; Notable for the following components:   Glucose-Capillary 149 (*)    All other components within  normal limits  TROPONIN I (HIGH SENSITIVITY)  TROPONIN I (HIGH SENSITIVITY)      EKG   EKG Interpretation Date/Time:  Monday Dec 12 2023 18:03:23 EDT Ventricular Rate:  45 PR Interval:  204 QRS Duration:  110 QT Interval:  477 QTC Calculation: 413 R Axis:   16  Text Interpretation: Sinus bradycardia Confirmed by Malique Driskill ([id number]) on 12/12/2023 10:53:37 PM         Imaging Studies ordered: I ordered imaging studies including chest x-ray, CT head I independently visualized and interpreted imaging. I agree with the radiologist interpretation   Medicines ordered and prescription drug management: No orders of the defined types were placed in this encounter.   -I have reviewed the patients home medicines and have made adjustments as needed  Critical interventions none   Cardiac Monitoring: The patient was maintained on a cardiac monitor.  I personally viewed and interpreted the cardiac monitored which showed an underlying rhythm of: Sinus bradycardia  Social Determinants of Health:  Factors impacting patients care include: Smoking marijuana, counseled cessation   Reevaluation: After the interventions noted above, I reevaluated the patient and found that they have :improved  Co morbidities that complicate the patient evaluation  Past Medical History:   Diagnosis Date   Atrial fibrillation (HCC)    Celiac disease    COPD (chronic obstructive pulmonary disease) (HCC)    Retinal detachment    Stroke (HCC)       Dispostion: I considered admission for this patient, but at this time he does not meet inpatient criteria for admission and will be discharged with outpatient follow-up.     Final Clinical Impression(s) / ED Diagnoses Final diagnoses:  Syncope, unspecified syncope type  Tetrahydrocannabinol (THC) use disorder, mild, abuse     @PCDICTATION @    Breena Bevacqua, Alyse July, MD 12/12/23 [phone]

## 2023-12-19 ENCOUNTER — Other Ambulatory Visit: Payer: Self-pay | Admitting: Physical Medicine & Rehabilitation

## 2023-12-20 ENCOUNTER — Other Ambulatory Visit: Payer: Self-pay

## 2023-12-20 ENCOUNTER — Other Ambulatory Visit (HOSPITAL_COMMUNITY): Payer: Self-pay

## 2023-12-23 ENCOUNTER — Other Ambulatory Visit (HOSPITAL_COMMUNITY): Payer: Self-pay

## 2023-12-25 ENCOUNTER — Other Ambulatory Visit: Payer: Self-pay

## 2023-12-31 ENCOUNTER — Telehealth: Payer: Self-pay | Admitting: Nurse Practitioner

## 2023-12-31 NOTE — Telephone Encounter (Signed)
 Patient's wife called the answering service.   Patient had just woken up from a nap and did not feel like talking.  The best I could tell he was having some lower abdominal discomfort maybe alternating bowel habits but wife was also inquiring about a bowel obstruction.  He is apparently not having any nausea nor vomiting.,  They were basically wanting an appointment  Recommend trial of MiraLAX  in case he is constipated.  Hold MiraLAX  if starts having nausea and vomiting and then had urgent care or the emergency department.  Otherwise advised to call our office Monday morning to make an office appointment

## 2024-01-18 ENCOUNTER — Other Ambulatory Visit: Payer: Self-pay | Admitting: Gastroenterology

## 2024-01-18 ENCOUNTER — Other Ambulatory Visit: Payer: Self-pay

## 2024-01-19 ENCOUNTER — Other Ambulatory Visit (HOSPITAL_COMMUNITY): Payer: Self-pay

## 2024-01-26 ENCOUNTER — Telehealth: Payer: Self-pay

## 2024-01-26 NOTE — Telephone Encounter (Signed)
 Inbound call from patient's wife Mallie, she states husband is out of town, she states Miralax  and and Dulcolax are not working. She states she believes Pantoprazole  and famotidine  are clogging him up, and would like recommendations from Dr. Trenna nurse on if the medications can be causing constipation or what else to do. Please advise.

## 2024-01-26 NOTE — Telephone Encounter (Signed)
 See telephone encounter of 01/26/24.

## 2024-01-26 NOTE — Telephone Encounter (Signed)
 Douglas Cox    Telephone Encounter Signed   Creation Time: 01/26/2024 10:09 AM   Signed     Inbound call from patient's wife Mallie, she states husband is out of town, she states Miralax  and and Dulcolax are not working. She states she believes Pantoprazole  and famotidine  are clogging him up, and would like recommendations from Dr. Trenna nurse on if the medications can be causing constipation or what else to do. Please advise.

## 2024-01-26 NOTE — Telephone Encounter (Signed)
 Spoke with Douglas Cox. The patient is [place] state for the summer. He called her with complaints of constipation for 8 days. He was told by a nurse of his PCP to drink Miralax  and take 3 Dulcolax tablets. He was further instructed by the nurse to consider ER if he did not have any passage of stool.  Douglas Cox tells me the patient will typically drink Miralax  as needed if he has not had a bowel movement in 5 days and that it did not help this time. I advised following the PCP recommendations was logical. We discussed going forward the patient may benefit from daily Miralax  or a regular regimen with the goal of daily to every other day bowel movements. Also scheduled an appointment for the patient in August when he is supposed to be back in [place].

## 2024-02-04 ENCOUNTER — Other Ambulatory Visit (HOSPITAL_COMMUNITY): Payer: Self-pay

## 2024-02-04 ENCOUNTER — Other Ambulatory Visit: Payer: Self-pay | Admitting: Gastroenterology

## 2024-02-06 ENCOUNTER — Other Ambulatory Visit (HOSPITAL_COMMUNITY): Payer: Self-pay

## 2024-02-06 MED ORDER — PANTOPRAZOLE SODIUM 40 MG PO TBEC
40.0000 mg | DELAYED_RELEASE_TABLET | Freq: Every day | ORAL | 0 refills | Status: DC
  Filled 2024-02-06 – 2024-03-27 (×2): qty 90, 90d supply, fill #0

## 2024-02-08 ENCOUNTER — Other Ambulatory Visit: Payer: Self-pay

## 2024-03-20 ENCOUNTER — Other Ambulatory Visit: Payer: Self-pay

## 2024-03-20 ENCOUNTER — Encounter: Payer: Self-pay | Admitting: Pharmacist

## 2024-03-20 ENCOUNTER — Other Ambulatory Visit (HOSPITAL_COMMUNITY): Payer: Self-pay

## 2024-03-21 ENCOUNTER — Ambulatory Visit: Admitting: Gastroenterology

## 2024-03-22 ENCOUNTER — Other Ambulatory Visit: Payer: Self-pay

## 2024-03-27 ENCOUNTER — Other Ambulatory Visit (HOSPITAL_COMMUNITY): Payer: Self-pay

## 2024-04-13 ENCOUNTER — Encounter (INDEPENDENT_AMBULATORY_CARE_PROVIDER_SITE_OTHER): Payer: Medicare HMO | Admitting: Ophthalmology

## 2024-05-08 NOTE — Progress Notes (Shared)
 [facility] - Clinic Note  05/16/2024     CHIEF COMPLAINT Patient presents for No chief complaint on file.  HISTORY OF PRESENT ILLNESS: Douglas Cox is a 73 y.o. male who presents to the clinic today for:    Pt states his vision is doing well, he is back to driving after his stroke   Referring physician: Judyth Isaiah Bottcher, DO [address] [place],  [place] [address]  HISTORICAL INFORMATION:  Selected notes from the MEDICAL RECORD NUMBER Self-referral for eval of increased floaters/veil OS   CURRENT MEDICATIONS: No current outpatient medications on file. (Ophthalmic Drugs)   No current facility-administered medications for this visit. (Ophthalmic Drugs)   Current Outpatient Medications (Other)  Medication Sig   apixaban  (ELIQUIS ) 5 MG TABS tablet Take 1 tablet (5 mg total) by mouth 2 (two) times daily.   Cyanocobalamin  (VITAMIN B-12) 5000 MCG SUBL Place 1 tablet under the tongue daily.   famotidine  (PEPCID ) 20 MG tablet Take 1 tablet (20 mg total) by mouth at bedtime.   magnesium  oxide (MAG-OX) 400 (240 Mg) MG tablet Take 0.5 tablets (200 mg total) by mouth at bedtime.   pantoprazole  (PROTONIX ) 40 MG tablet Take 1 tablet (40 mg total) by mouth once daily each morning.   rosuvastatin  (CRESTOR ) 20 MG tablet Take 1 tablet (20 mg total) by mouth daily.   traMADol  (ULTRAM ) 50 MG tablet Take 1 tablet (50 mg total) by mouth every 6 (six) hours as needed for pain (Patient not taking: Reported on 10/25/2023)   vitamin D3 (CHOLECALCIFEROL ) 25 MCG tablet Take 1 tablet (1,000 Units total) by mouth daily.   No current facility-administered medications for this visit. (Other)   REVIEW OF SYSTEMS:    ALLERGIES Allergies  Allergen Reactions   Nitrous Oxide Other (See Comments)    Can cause blindness in right eye    Codone [Hydrocodone] Hives   Tylenol  [Acetaminophen ] Hives   Hydrocodone-Acetaminophen  Itching    Other Reaction(s):  itching/hives  acetaminophen  / hydrocodone   Lactose Other (See Comments)    lactose   Oxycodone Hcl     Other Reaction(s): itching/hives   Clarithromycin Anxiety    Other Reaction(s): agitated   Codeine Rash   Gluten Meal Other (See Comments)    Stomach issues  wheat gluten extract   Lactose Intolerance (Gi) Other (See Comments)    GI issues   Oxycodone Rash   PAST MEDICAL HISTORY Past Medical History:  Diagnosis Date   Atrial fibrillation (HCC)    Celiac disease    COPD (chronic obstructive pulmonary disease) (HCC)    Retinal detachment    Stroke (HCC)    Past Surgical History:  Procedure Laterality Date   CATARACT EXTRACTION Bilateral    Dr. Waylan   EYE SURGERY     FRACTURE SURGERY     GAS INSERTION Right 05/21/2021   Procedure: INSERTION OF GAS - C3F8;  Surgeon: Valdemar Rogue, MD;  Location: [facility] OR;  Service: Ophthalmology;  Laterality: Right;   GAS/FLUID EXCHANGE Right 05/21/2021   Procedure: GAS/FLUID EXCHANGE;  Surgeon: Valdemar Rogue, MD;  Location: [facility] OR;  Service: Ophthalmology;  Laterality: Right;   HERNIA REPAIR     IR CT HEAD LTD  07/17/2022   IR PERCUTANEOUS ART THROMBECTOMY/INFUSION INTRACRANIAL INC DIAG ANGIO  07/17/2022   IR US  GUIDE VASC ACCESS RIGHT  07/17/2022   PARS PLANA VITRECTOMY Right 05/21/2021   Procedure: PARS PLANA VITRECTOMY WITH 25 GAUGE;  Surgeon: Valdemar Rogue, MD;  Location: [facility]  OR;  Service: Ophthalmology;  Laterality: Right;   PERFLUORONE INJECTION Right 05/21/2021   Procedure: PERFLUORONE INJECTION;  Surgeon: Valdemar Rogue, MD;  Location: [facility] OR;  Service: Ophthalmology;  Laterality: Right;   PHOTOCOAGULATION WITH LASER Right 05/21/2021   Procedure: PHOTOCOAGULATION WITH LASER;  Surgeon: Valdemar Rogue, MD;  Location: [facility] OR;  Service: Ophthalmology;  Laterality: Right;   RADIOLOGY WITH ANESTHESIA N/A 07/17/2022   Procedure: IR WITH ANESTHESIA;  Surgeon: Radiologist, Medication, MD;  Location: [facility] OR;  Service: Radiology;  Laterality: N/A;    RETINAL DETACHMENT SURGERY     FAMILY HISTORY Family History  Problem Relation Age of Onset   Colon cancer Neg Hx    Esophageal cancer Neg Hx    Rectal cancer Neg Hx    Stomach cancer Neg Hx     SOCIAL HISTORY Social History   Tobacco Use   Smoking status: Former    Current packs/day: 0.00    Types: Cigarettes    Quit date: 01/30/2006    Years since quitting: 18.2   Smokeless tobacco: Never  Vaping Use   Vaping status: Never Used  Substance Use Topics   Alcohol use: Not Currently    Alcohol/week: 2.0 standard drinks of alcohol    Types: 2 Shots of liquor per week   Drug use: Not Currently    Frequency: 7.0 times per week    Types: Marijuana    Comment: last use 04/03/23       OPHTHALMIC EXAM: Not recorded    IMAGING AND PROCEDURES  Imaging and Procedures for 05/16/2024          ASSESSMENT/PLAN: No diagnosis found.  1. 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 PVD OS  - acute development of prominent floaters OS -- saw Dr. Octavia on call on Saturday, 10.16.21  - prominent floaters / vitreous condensations remain settled inferiorly  - Discussed findings and prognosis   - No RT or RD on 360 scleral depressed exam  - Reviewed s/s of RT/RD  - Strict return precautions for any such RT/RD signs/symptoms   - f/u in 1 year -- DFE/OCT  3. Pseudophakia OU  - s/p CE/IOL OU (Dr. Waylan)  - IOLs in good position, doing well  -  mild PCO OU  - monitor   4. Dry eyes OU - recommend artificial tears and lubricating ointment as needed  5. History of CVA  - HVF 30-2 performed 09.11.24  - mild superior paracentral defects OU -- not likely related to history of CVA  Ophthalmic Meds Ordered this visit:  No orders of the defined types were placed in this encounter.    No follow-ups on file.  There are no Patient Instructions on file for this visit.  This document serves as a record of  services personally performed by Redell JUDITHANN Hans, MD, PhD. It was created on their behalf by Almetta Pesa, an ophthalmic technician. The creation of this record is the provider's dictation and/or activities during the visit.    Electronically signed by: Almetta Pesa, OA, 05/08/24  12:58 PM    Redell JUDITHANN Hans, M.D., Ph.D. Diseases & Surgery of the Retina and Vitreous [facility]   Abbreviations: M myopia (nearsighted); A astigmatism; H hyperopia (farsighted); P presbyopia; Mrx spectacle prescription;  CTL contact lenses; OD right eye; OS left eye; OU both eyes  XT exotropia; ET esotropia; PEK punctate epithelial keratitis; PEE punctate epithelial erosions; DES dry eye syndrome; MGD meibomian gland dysfunction; ATs artificial tears; PFAT's preservative free artificial tears; NSC nuclear sclerotic cataract; PSC posterior subcapsular cataract; ERM epi-retinal membrane; PVD posterior vitreous detachment; RD retinal detachment; DM diabetes mellitus; DR diabetic retinopathy; NPDR non-proliferative diabetic retinopathy; PDR proliferative diabetic retinopathy; CSME clinically significant macular edema; DME diabetic macular edema; dbh dot blot hemorrhages; CWS cotton wool spot; POAG primary open angle glaucoma; C/D cup-to-disc ratio; HVF humphrey visual field; GVF goldmann visual field; OCT optical coherence tomography; IOP intraocular pressure; BRVO Branch retinal vein occlusion; CRVO central retinal vein occlusion;  CRAO central retinal artery occlusion; BRAO branch retinal artery occlusion; RT retinal tear; SB scleral buckle; PPV pars plana vitrectomy; VH Vitreous hemorrhage; PRP panretinal laser photocoagulation; IVK intravitreal kenalog ; VMT vitreomacular traction; MH Macular hole;  NVD neovascularization of the disc; NVE neovascularization elsewhere; AREDS age related eye disease study; ARMD age related macular degeneration; POAG primary open angle glaucoma; EBMD epithelial/anterior basement membrane dystrophy; ACIOL anterior chamber intraocular lens; IOL intraocular lens; PCIOL posterior chamber intraocular lens; Phaco/IOL phacoemulsification with intraocular lens placement; PRK photorefractive keratectomy; LASIK laser assisted in situ keratomileusis; HTN hypertension; DM diabetes mellitus; COPD chronic obstructive pulmonary disease

## 2024-05-09 ENCOUNTER — Other Ambulatory Visit (HOSPITAL_COMMUNITY): Payer: Self-pay

## 2024-05-11 ENCOUNTER — Other Ambulatory Visit: Payer: Self-pay

## 2024-05-14 NOTE — Progress Notes (Signed)
 [facility] - Clinic Note  05/21/2024     CHIEF COMPLAINT Patient presents for Retina Follow Up  HISTORY OF PRESENT ILLNESS: Douglas Cox is a 73 y.o. male who presents to the clinic today for:   HPI     Retina Follow Up   Patient presents with  Retinal Break/Detachment.  In right eye.  This started 3 years ago.  Severity is moderate.  Duration of 1 year.  Since onset it is stable.  I, the attending physician,  performed the HPI with the patient and updated documentation appropriately.        Comments   Pt denies any changes in vision/no FOL/floaters/pain. Pt does not use ats.      Last edited by Valdemar Rogue, MD on 05/28/2024  2:28 AM.     Patient feels the vision is stable. He will see an occasional flash of light.   Referring physician: Judyth Isaiah Bottcher, DO [address] [place],  [place] [address]  HISTORICAL INFORMATION:  Selected notes from the MEDICAL RECORD NUMBER Self-referral for eval of increased floaters/veil OS   CURRENT MEDICATIONS: No current outpatient medications on file. (Ophthalmic Drugs)   No current facility-administered medications for this visit. (Ophthalmic Drugs)   Current Outpatient Medications (Other)  Medication Sig   apixaban  (ELIQUIS ) 5 MG TABS tablet Take 1 tablet (5 mg total) by mouth 2 (two) times daily.   Cyanocobalamin  (VITAMIN B-12) 5000 MCG SUBL Place 1 tablet under the tongue daily. (Patient not taking: Reported on 05/21/2024)   famotidine  (PEPCID ) 20 MG tablet Take 1 tablet (20 mg total) by mouth at bedtime.   magnesium  oxide (MAG-OX) 400 (240 Mg) MG tablet Take 0.5 tablets (200 mg total) by mouth at bedtime.   pantoprazole  (PROTONIX ) 40 MG tablet Take 1 tablet (40 mg total) by mouth once daily each morning.   rosuvastatin  (CRESTOR ) 20 MG tablet Take 1 tablet (20 mg total) by mouth daily.   traMADol  (ULTRAM ) 50 MG tablet Take 1 tablet (50 mg total) by mouth every 6 (six) hours as needed for  pain (Patient not taking: Reported on 10/25/2023)   vitamin D3 (CHOLECALCIFEROL ) 25 MCG tablet Take 1 tablet (1,000 Units total) by mouth daily.   No current facility-administered medications for this visit. (Other)   REVIEW OF SYSTEMS: ROS   Positive for: Neurological, Cardiovascular, Eyes Negative for: Constitutional, Gastrointestinal, Skin, Genitourinary, Musculoskeletal, HENT, Endocrine, Respiratory, Psychiatric, Allergic/Imm, Heme/Lymph Last edited by Elnor Avelina RAMAN, COT on 05/21/2024  1:29 PM.       ALLERGIES Allergies  Allergen Reactions   Nitrous Oxide Other (See Comments)    Can cause blindness in right eye    Codone [Hydrocodone] Hives   Tylenol  [Acetaminophen ] Hives   Hydrocodone-Acetaminophen  Itching    Other Reaction(s): itching/hives  acetaminophen  / hydrocodone   Lactose Other (See Comments)    lactose   Oxycodone Hcl     Other Reaction(s): itching/hives   Clarithromycin Anxiety    Other Reaction(s): agitated   Codeine Rash   Gluten Meal Other (See Comments)    Stomach issues  wheat gluten extract   Lactose Intolerance (Gi) Other (See Comments)    GI issues   Oxycodone Rash   PAST MEDICAL HISTORY Past Medical History:  Diagnosis Date   Atrial fibrillation (HCC)    Celiac disease    COPD (chronic obstructive pulmonary disease) (HCC)    Retinal detachment    Stroke [facility])    Past Surgical History:  Procedure Laterality Date  CATARACT EXTRACTION Bilateral    Dr. Waylan   EYE SURGERY     FRACTURE SURGERY     GAS INSERTION Right 05/21/2021   Procedure: INSERTION OF GAS - C3F8;  Surgeon: Valdemar Rogue, MD;  Location: [facility] OR;  Service: Ophthalmology;  Laterality: Right;   GAS/FLUID EXCHANGE Right 05/21/2021   Procedure: GAS/FLUID EXCHANGE;  Surgeon: Valdemar Rogue, MD;  Location: [facility] OR;  Service: Ophthalmology;  Laterality: Right;   HERNIA REPAIR     IR CT HEAD LTD  07/17/2022   IR PERCUTANEOUS ART THROMBECTOMY/INFUSION INTRACRANIAL INC DIAG ANGIO   07/17/2022   IR US  GUIDE VASC ACCESS RIGHT  07/17/2022   PARS PLANA VITRECTOMY Right 05/21/2021   Procedure: PARS PLANA VITRECTOMY WITH 25 GAUGE;  Surgeon: Valdemar Rogue, MD;  Location: [facility] OR;  Service: Ophthalmology;  Laterality: Right;   PERFLUORONE INJECTION Right 05/21/2021   Procedure: PERFLUORONE INJECTION;  Surgeon: Valdemar Rogue, MD;  Location: [facility] OR;  Service: Ophthalmology;  Laterality: Right;   PHOTOCOAGULATION WITH LASER Right 05/21/2021   Procedure: PHOTOCOAGULATION WITH LASER;  Surgeon: Valdemar Rogue, MD;  Location: [facility] OR;  Service: Ophthalmology;  Laterality: Right;   RADIOLOGY WITH ANESTHESIA N/A 07/17/2022   Procedure: IR WITH ANESTHESIA;  Surgeon: Radiologist, Medication, MD;  Location: [facility] OR;  Service: Radiology;  Laterality: N/A;   RETINAL DETACHMENT SURGERY     FAMILY HISTORY Family History  Problem Relation Age of Onset   Colon cancer Neg Hx    Esophageal cancer Neg Hx    Rectal cancer Neg Hx    Stomach cancer Neg Hx     SOCIAL HISTORY Social History   Tobacco Use   Smoking status: Former    Current packs/day: 0.00    Types: Cigarettes    Quit date: 01/30/2006    Years since quitting: 18.3   Smokeless tobacco: Never  Vaping Use   Vaping status: Never Used  Substance Use Topics   Alcohol use: Not Currently    Alcohol/week: 2.0 standard drinks of alcohol    Types: 2 Shots of liquor per week   Drug use: Not Currently    Frequency: 7.0 times per week    Types: Marijuana    Comment: last use 04/03/23       OPHTHALMIC EXAM: Base Eye Exam     Visual Acuity (Snellen - Linear)       Right Left   Dist cc 20/20 -1 20/20    Correction: Glasses         Tonometry (Tonopen, 1:33 PM)       Right Left   Pressure 15 14         Pupils       Pupils Dark Light Shape React APD   Right PERRL 4 2 Round Brisk None   Left PERRL 4 2 Round Brisk None         Visual Fields       Left Right    Full Full         Extraocular Movement       Right  Left    Full, Ortho Full, Ortho         Neuro/Psych     Oriented x3: Yes   Mood/Affect: Normal         Dilation     Both eyes: 1.0% Mydriacyl , 2.5% Phenylephrine  @ 1:35 PM           Slit Lamp and Fundus Exam     Slit Lamp Exam  Right Left   Lids/Lashes Dermatochalasis - upper lid Dermatochalasis - upper lid   Conjunctiva/Sclera White and quiet White and quiet   Cornea Arcus, well healed cataract wounds, trace tear film debris arcus, well healed cataract wounds, trace PEE   Anterior Chamber deep and clear deep and clear   Iris Round and dilated Round and dilated   Lens PC IOL in excellent position, 1+Posterior capsular opacification PC IOL in good position, trace Posterior capsular opacification   Anterior Vitreous Post vitrectomy mild syneresis, no pigment, Posterior vitreous detachment, vitreous condensations settled inferiorly         Fundus Exam       Right Left   Disc Pink and Sharp, Compact Pink and Sharp, Compact, mild tilt   C/D Ratio 0.2 0.2   Macula Flat, Blunted foveal reflex, trace ERM, RPE mottling, No heme or edema Flat, Blunted foveal reflex, mild RPE mottling and clumping, No heme or edema   Vessels attenuated, mild tortuosity attenuated, mild tortuosity   Periphery Retina re-attached; 360 peripheral laser changes. ORIGINALLY: Bullous nasal RD from 1230-0630 with small HST at 0130; good cryo changes around 0130 tear, no new RT/RD Attached, mild pigmented paving stone degeneration IT quad, No RT/RD, No heme           Refraction     Wearing Rx       Sphere Cylinder Axis Add   Right +0.25 Sphere  +2.50   Left -0.50 +0.75 117 +2.50    Age: 36-2 years           IMAGING AND PROCEDURES  Imaging and Procedures for 05/21/2024  OCT, Retina - OU - Both Eyes       Right Eye Quality was good. Central Foveal Thickness: 288. Progression has been stable. Findings include normal foveal contour, no IRF, no SRF, myopic contour, epiretinal  membrane (Inferior retina stably re-attached, trace ERM).   Left Eye Quality was good. Central Foveal Thickness: 267. Progression has been stable. Findings include normal foveal contour, no IRF, no SRF (Trace ERM).   Notes *Images captured and stored on drive  Diagnosis / Impression:  OD: NFP, no IRF; Inferior retina stably re-attached, tr ERM OS: NFP, no IRF/SRF  Clinical management:  See below  Abbreviations: NFP - Normal foveal profile. CME - cystoid macular edema. PED - pigment epithelial detachment. IRF - intraretinal fluid. SRF - subretinal fluid. EZ - ellipsoid zone. ERM - epiretinal membrane. ORA - outer retinal atrophy. ORT - outer retinal tubulation. SRHM - subretinal hyper-reflective material. IRHM - intraretinal hyper-reflective material             ASSESSMENT/PLAN:   ICD-10-CM   1. Right retinal detachment  H33.21 OCT, Retina - OU - Both Eyes    2. Posterior vitreous detachment of left eye  H43.812     3. Pseudophakia, both eyes  Z96.1     4. Dry eyes  H04.123     5. 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 (HCC)  I63.81      1. 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 PVD OS - acute development of prominent floaters OS -- saw Dr. Octavia on call on Saturday, 10.16.21  - prominent floaters / vitreous condensations remain settled inferiorly  - Discussed findings and prognosis   - No RT or RD on 360 scleral  depressed exam  - Reviewed s/s of RT/RD  - Strict return precautions for any such RT/RD signs/symptoms   - f/u in 1 year -- DFE/OCT  3. Pseudophakia OU  - s/p CE/IOL OU (Dr. Waylan)  - IOLs in good position, doing well  - mild PCO OU  - monitor   4. Dry eyes OU - recommend artificial tears and lubricating ointment as needed  5. History of CVA  - HVF 30-2 performed 09.11.24 - mild superior paracentral defects OU -- not likely related to history of CVA  Ophthalmic Meds Ordered this visit:  No orders of the defined types were placed in this encounter.    Return in about 1 year (around 05/21/2025) for f/u, RD, DFE, OCT.  There are no Patient Instructions on file for this visit.  This document serves as a record of services personally performed by Redell JUDITHANN Hans, MD, PhD. It was created on their behalf by Almetta Pesa, an ophthalmic technician. The creation of this record is the provider's dictation and/or activities during the visit.    Electronically signed by: Almetta Pesa, OA, 05/28/24  2:31 AM  This document serves as a record of services personally performed by Redell JUDITHANN Hans, MD, PhD. It was created on their behalf by Wanda GEANNIE Keens, COT an ophthalmic technician. The creation of this record is the provider's dictation and/or activities during the visit.    Electronically signed by:  Wanda GEANNIE Keens, COT  05/28/24 2:31 AM  Redell JUDITHANN Hans, M.D., Ph.D. Diseases & Surgery of the Retina and Vitreous [facility]  I have reviewed the above documentation for accuracy and completeness, and I agree with the above. Redell JUDITHANN Hans, M.D., Ph.D. 05/28/24 2:33 AM    Abbreviations: M myopia (nearsighted); A astigmatism; H hyperopia (farsighted); P presbyopia; Mrx spectacle prescription;  CTL contact lenses; OD right eye; OS left eye; OU both eyes  XT exotropia; ET esotropia; PEK punctate epithelial keratitis; PEE punctate epithelial erosions; DES  dry eye syndrome; MGD meibomian gland dysfunction; ATs artificial tears; PFAT's preservative free artificial tears; NSC nuclear sclerotic cataract; PSC posterior subcapsular cataract; ERM epi-retinal membrane; PVD posterior vitreous detachment; RD retinal detachment; DM diabetes mellitus; DR diabetic retinopathy; NPDR non-proliferative diabetic retinopathy; PDR proliferative diabetic retinopathy; CSME clinically significant macular edema; DME diabetic macular edema; dbh dot blot hemorrhages; CWS cotton wool spot; POAG primary open angle glaucoma; C/D cup-to-disc ratio; HVF humphrey visual field; GVF goldmann visual field; OCT optical coherence tomography; IOP intraocular pressure; BRVO Branch retinal vein occlusion; CRVO central retinal vein occlusion; CRAO central retinal artery occlusion; BRAO branch retinal artery occlusion; RT retinal tear; SB scleral buckle; PPV pars plana vitrectomy; VH Vitreous hemorrhage; PRP panretinal laser photocoagulation; IVK intravitreal kenalog ; VMT vitreomacular traction; MH Macular hole;  NVD neovascularization of the disc; NVE neovascularization elsewhere; AREDS age related eye disease study; ARMD age related macular degeneration; POAG primary open angle glaucoma;  EBMD epithelial/anterior basement membrane dystrophy; ACIOL anterior chamber intraocular lens; IOL intraocular lens; PCIOL posterior chamber intraocular lens; Phaco/IOL phacoemulsification with intraocular lens placement; PRK photorefractive keratectomy; LASIK laser assisted in situ keratomileusis; HTN hypertension; DM diabetes mellitus; COPD chronic obstructive pulmonary disease

## 2024-05-16 ENCOUNTER — Encounter (INDEPENDENT_AMBULATORY_CARE_PROVIDER_SITE_OTHER): Admitting: Ophthalmology

## 2024-05-16 DIAGNOSIS — Z961 Presence of intraocular lens: Secondary | ICD-10-CM

## 2024-05-16 DIAGNOSIS — H43812 Vitreous degeneration, left eye: Secondary | ICD-10-CM

## 2024-05-16 DIAGNOSIS — M7541 Impingement syndrome of right shoulder: Secondary | ICD-10-CM | POA: Insufficient documentation

## 2024-05-16 DIAGNOSIS — H04123 Dry eye syndrome of bilateral lacrimal glands: Secondary | ICD-10-CM

## 2024-05-16 DIAGNOSIS — I6381 Other cerebral infarction due to occlusion or stenosis of small artery: Secondary | ICD-10-CM

## 2024-05-16 DIAGNOSIS — H3321 Serous retinal detachment, right eye: Secondary | ICD-10-CM

## 2024-05-21 ENCOUNTER — Ambulatory Visit (INDEPENDENT_AMBULATORY_CARE_PROVIDER_SITE_OTHER): Admitting: Ophthalmology

## 2024-05-21 ENCOUNTER — Encounter (INDEPENDENT_AMBULATORY_CARE_PROVIDER_SITE_OTHER): Payer: Self-pay | Admitting: Ophthalmology

## 2024-05-21 DIAGNOSIS — H04123 Dry eye syndrome of bilateral lacrimal glands: Secondary | ICD-10-CM

## 2024-05-21 DIAGNOSIS — H43812 Vitreous degeneration, left eye: Secondary | ICD-10-CM | POA: Diagnosis not present

## 2024-05-21 DIAGNOSIS — H3321 Serous retinal detachment, right eye: Secondary | ICD-10-CM

## 2024-05-21 DIAGNOSIS — Z961 Presence of intraocular lens: Secondary | ICD-10-CM | POA: Diagnosis not present

## 2024-05-21 DIAGNOSIS — I6381 Other cerebral infarction due to occlusion or stenosis of small artery: Secondary | ICD-10-CM

## 2024-05-28 ENCOUNTER — Encounter (INDEPENDENT_AMBULATORY_CARE_PROVIDER_SITE_OTHER): Payer: Self-pay | Admitting: Ophthalmology

## 2024-05-28 ENCOUNTER — Telehealth (HOSPITAL_BASED_OUTPATIENT_CLINIC_OR_DEPARTMENT_OTHER): Payer: Self-pay

## 2024-05-28 NOTE — Telephone Encounter (Signed)
   Pre-operative Risk Assessment    Patient Name: Douglas Cox  DOB: 10-06-1950 MRN: [id number]   Date of last office visit: 08/31/23 [place]  Date of next office visit: NA   Request for Surgical Clearance    Procedure:  Right Shoulder Arthroscopy   Date of Surgery:  Clearance TBD                                Surgeon:  Dr. Franky Pointer Surgeon's Group or Practice Name:  [facility] Phone number:  [phone] Fax number:  [phone]   Type of Clearance Requested:   - Medical  - Pharmacy:  Hold Apixaban  (Eliquis ) Not indicated    Type of Anesthesia:  General    Additional requests/questions:    Bonney Augustin JONETTA Delores   05/28/2024, 1:46 PM

## 2024-05-30 ENCOUNTER — Encounter: Payer: Self-pay | Admitting: Physical Medicine & Rehabilitation

## 2024-05-30 ENCOUNTER — Telehealth: Payer: Self-pay | Admitting: *Deleted

## 2024-05-30 ENCOUNTER — Encounter: Attending: Physical Medicine & Rehabilitation | Admitting: Physical Medicine & Rehabilitation

## 2024-05-30 VITALS — BP 120/66 | HR 44 | Ht 70.0 in | Wt 147.8 lb

## 2024-05-30 DIAGNOSIS — I6381 Other cerebral infarction due to occlusion or stenosis of small artery: Secondary | ICD-10-CM | POA: Diagnosis present

## 2024-05-30 NOTE — Progress Notes (Signed)
 Subjective:    Patient ID: Douglas Cox, male    DOB: 06/14/1951, 73 y.o.   MRN: [id number]  HPI  Mr. Boyajian is here in follow up of his bilateral basal ganglia infarcts.   He has had problems with his Raynaud's in hands.   His energy levels have been low. He's taking a supplement which hasn't had a big impact.   He complains of left nipple tightness.   He has leg weakness and associated pain since he had his stroke. He had a fall recently in the dark. He finds it hard to get off the ground. His po intake has been ok. His appetite is fair. He doesn't appear to have lost weight.  He is on Crestor .  His legs do feel somewhat sore as well.  He has osteopenia. He doesn't want take calcium  supplement as he's lactose tolerant.   Magnesium  taken for sleep, brain?  He is also on other supplements for energy as well.      Pain Inventory Average Pain 5 Pain Right Now 5 My pain is right shoulder  In the last 24 hours, has pain interfered with the following? General activity 5 Relation with others 5 Enjoyment of life 5 What TIME of day is your pain at its worst? varies Sleep (in general) NA  Pain is worse with: some activites Pain improves with: rest Relief from Meds: no med  Family History  Problem Relation Age of Onset   Colon cancer Neg Hx    Esophageal cancer Neg Hx    Rectal cancer Neg Hx    Stomach cancer Neg Hx    Social History   Socioeconomic History   Marital status: Married    Spouse name: Not on file   Number of children: Not on file   Years of education: Not on file   Highest education level: Not on file  Occupational History   Not on file  Tobacco Use   Smoking status: Former    Current packs/day: 0.00    Types: Cigarettes    Quit date: 01/30/2006    Years since quitting: 18.3   Smokeless tobacco: Never  Vaping Use   Vaping status: Never Used  Substance and Sexual Activity   Alcohol use: Not Currently    Alcohol/week: 2.0 standard drinks  of alcohol    Types: 2 Shots of liquor per week   Drug use: Not Currently    Frequency: 7.0 times per week    Types: Marijuana    Comment: last use 04/03/23   Sexual activity: Not Currently  Other Topics Concern   Not on file  Social History Narrative   Not on file   Social Drivers of Health   Financial Resource Strain: Not on file  Food Insecurity: No Food Insecurity (07/21/2022)   Hunger Vital Sign    Worried About Running Out of Food in the Last Year: Never true    Ran Out of Food in the Last Year: Never true  Transportation Needs: No Transportation Needs (07/21/2022)   PRAPARE - Administrator, Civil Service (Medical): No    Lack of Transportation (Non-Medical): No  Physical Activity: Not on file  Stress: Not on file  Social Connections: Not on file   Past Surgical History:  Procedure Laterality Date   CATARACT EXTRACTION Bilateral    Dr. Waylan   EYE SURGERY     FRACTURE SURGERY     GAS INSERTION Right 05/21/2021   Procedure: INSERTION OF GAS -  C3F8;  Surgeon: Valdemar Rogue, MD;  Location: [facility] OR;  Service: Ophthalmology;  Laterality: Right;   GAS/FLUID EXCHANGE Right 05/21/2021   Procedure: GAS/FLUID EXCHANGE;  Surgeon: Valdemar Rogue, MD;  Location: [facility] OR;  Service: Ophthalmology;  Laterality: Right;   HERNIA REPAIR     IR CT HEAD LTD  07/17/2022   IR PERCUTANEOUS ART THROMBECTOMY/INFUSION INTRACRANIAL INC DIAG ANGIO  07/17/2022   IR US  GUIDE VASC ACCESS RIGHT  07/17/2022   PARS PLANA VITRECTOMY Right 05/21/2021   Procedure: PARS PLANA VITRECTOMY WITH 25 GAUGE;  Surgeon: Valdemar Rogue, MD;  Location: [facility] OR;  Service: Ophthalmology;  Laterality: Right;   PERFLUORONE INJECTION Right 05/21/2021   Procedure: PERFLUORONE INJECTION;  Surgeon: Valdemar Rogue, MD;  Location: [facility] OR;  Service: Ophthalmology;  Laterality: Right;   PHOTOCOAGULATION WITH LASER Right 05/21/2021   Procedure: PHOTOCOAGULATION WITH LASER;  Surgeon: Valdemar Rogue, MD;  Location: [facility] OR;   Service: Ophthalmology;  Laterality: Right;   RADIOLOGY WITH ANESTHESIA N/A 07/17/2022   Procedure: IR WITH ANESTHESIA;  Surgeon: Radiologist, Medication, MD;  Location: [facility] OR;  Service: Radiology;  Laterality: N/A;   RETINAL DETACHMENT SURGERY     Past Surgical History:  Procedure Laterality Date   CATARACT EXTRACTION Bilateral    Dr. Waylan   EYE SURGERY     FRACTURE SURGERY     GAS INSERTION Right 05/21/2021   Procedure: INSERTION OF GAS - C3F8;  Surgeon: Valdemar Rogue, MD;  Location: [facility] OR;  Service: Ophthalmology;  Laterality: Right;   GAS/FLUID EXCHANGE Right 05/21/2021   Procedure: GAS/FLUID EXCHANGE;  Surgeon: Valdemar Rogue, MD;  Location: [facility] OR;  Service: Ophthalmology;  Laterality: Right;   HERNIA REPAIR     IR CT HEAD LTD  07/17/2022   IR PERCUTANEOUS ART THROMBECTOMY/INFUSION INTRACRANIAL INC DIAG ANGIO  07/17/2022   IR US  GUIDE VASC ACCESS RIGHT  07/17/2022   PARS PLANA VITRECTOMY Right 05/21/2021   Procedure: PARS PLANA VITRECTOMY WITH 25 GAUGE;  Surgeon: Valdemar Rogue, MD;  Location: [facility] OR;  Service: Ophthalmology;  Laterality: Right;   PERFLUORONE INJECTION Right 05/21/2021   Procedure: PERFLUORONE INJECTION;  Surgeon: Valdemar Rogue, MD;  Location: [facility] OR;  Service: Ophthalmology;  Laterality: Right;   PHOTOCOAGULATION WITH LASER Right 05/21/2021   Procedure: PHOTOCOAGULATION WITH LASER;  Surgeon: Valdemar Rogue, MD;  Location: [facility] OR;  Service: Ophthalmology;  Laterality: Right;   RADIOLOGY WITH ANESTHESIA N/A 07/17/2022   Procedure: IR WITH ANESTHESIA;  Surgeon: Radiologist, Medication, MD;  Location: [facility] OR;  Service: Radiology;  Laterality: N/A;   RETINAL DETACHMENT SURGERY     Past Medical History:  Diagnosis Date   Atrial fibrillation (HCC)    Celiac disease    COPD (chronic obstructive pulmonary disease) (HCC)    Retinal detachment    Stroke (HCC)    BP 120/66   Pulse (!) 44   Ht 5' 10 (1.778 m)   Wt 147 lb 12.8 oz (67 kg)   SpO2 97%   BMI 21.21 kg/m    Opioid Risk Score:   Fall Risk Score:  `1  Depression screen [facility] 2/9     07/20/2023   12:59 PM 09/01/2022   11:43 AM  Depression screen PHQ 2/9  Decreased Interest 0 0  Down, Depressed, Hopeless 0 0  PHQ - 2 Score 0 0  Altered sleeping  1  Tired, decreased energy  0  Change in appetite  0  Feeling bad or failure about yourself   1  Trouble concentrating  1  Moving slowly or fidgety/restless  0  PHQ-9 Score  3     Review of Systems  Musculoskeletal:  Positive for gait problem.       Right shoulder-- fell in bathroom 2 weeks ago (into bathtub when getting up in the night to urinate)  All other systems reviewed and are negative.      Objective:   Physical Exam General: No acute distress HEENT: NCAT, EOMI, oral membranes moist Cards: reg rate  Chest: normal effort Abdomen: Soft, NT, ND Skin: dry, intact. Left nipple sensitive, normal appearing Extremities: no edema Psych: pleasant and appropriate . Neuro: Patient is alert and oriented x 3.  Improved language.  Processing speed insight and awareness are much improved as are short-term memory, although with some delay still..  Strength is nearly 4 to 5 out of 5 throughout however he is weaker proximally in his lower extremities at 4 out of 5..  Coordination is intact.  Romberg is negative.  He is able to stand on each leg unsupported for a few seconds. Musculoskeletal: RIGHT Shojlder llimited                Medical Problem List and Plan: 1. 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                - He does have some proximal muscle weakness.  Could this be related to his statin?  - Recommended holding in the short-term to see if any of his pain improves.  Might benefit from having a creatine kinase checked as well by his family doctor. 2.  Atrial fibrillation: eliquis  per cards  -cardiology  follow-up  3. Bilateral greater troch bursitis: improved             -ice, stretching, heat before stretching described             -voltaren  gel 4.  Affect/sleep: improved as a whole          -have discussed sleep hygiene 5. Right frozen shoulder             -HEP             -no overhead weight lifting  -followed by ortho. Arthroscopic surgery  - Will need to discuss with neurology regarding holding Eliquis  for surgery 6. Slow transit constipation:             -liquids are important, fiber, probiotic              -  7. Left perianal cyst/abscess---drained by surgery   8.  Nutrition:               -we have discussed importance of regular exercise as well. His weight is stable today               9.  Raynaud's--discussed options for treatment. Limited somewhat by his bp/ HR -provided extensive handout:   10.Perianal cyst 11. HTN. Controlled off meds 12. Reviewed multiple supplements today.   - First and foremost he needs to maintain a solid diet including plenty of protein and calcium  -There are a lot of different supplements out there and he has to find the ones that he tolerates the best.  He may absorb some better than others. -                  20 minutes of face to  face patient care time were spent during this visit. All questions were encouraged and answered. Follow up with me prn

## 2024-05-30 NOTE — Telephone Encounter (Signed)
 Received clearance request for Rt shoulder arthroscopy. Pt was last seen by Dr Margaret 09/2022 and was released back to PCP. I have faxed the clearance back to [facility] asking them to have PCP clear. Received a receipt of confirmation.

## 2024-05-30 NOTE — Patient Instructions (Addendum)
 ALWAYS FEEL FREE TO CALL OUR OFFICE WITH ANY PROBLEMS OR QUESTIONS [phone])  **PLEASE NOTE** ALL MEDICATION REFILL REQUESTS (INCLUDING CONTROLLED SUBSTANCES) NEED TO BE MADE AT LEAST 7 DAYS PRIOR TO REFILL BEING DUE. ANY REFILL REQUESTS INSIDE THAT TIME FRAME MAY RESULT IN DELAYS IN RECEIVING YOUR PRESCRIPTION.     INCREASE EXERCISE! BOTH FOR ENERGY AND BONES!!   ?ENDOCRINE WORK UP. TESTOSTERONE  LEVEL, THYROID  LEVELS.  B12, FOLATE LEVELS, LIVER FUNCTION TESTS, COMPLETE METABOLIC PANEL, CBC.

## 2024-06-03 NOTE — Telephone Encounter (Signed)
 Patient with diagnosis of atrial fibrillation on Eliquis  for anticoagulation.    Procedure:  Right Shoulder Arthroscopy    Date of Surgery:  Clearance TBD      CHA2DS2-VASc Score = 3   This indicates a 3.2% annual risk of stroke. The patient's score is based upon: CHF History: 0 HTN History: 0 Diabetes History: 0 Stroke History: 2 Vascular Disease History: 0 Age Score: 1 Gender Score: 0  Patient has hx of CVA 07/2022 CrCl 58 Platelet count 202  Patient has not had an Afib/aflutter ablation in the last 3 months, DCCV within the last 4 weeks or a watchman implanted in the last 45 days   Per office protocol, patient can hold Eliquis  for 3 days prior to procedure.   Patient will not need bridging with Lovenox  (enoxaparin ) around procedure.  **This guidance is not considered finalized until pre-operative APP has relayed final recommendations.**

## 2024-06-04 ENCOUNTER — Telehealth: Payer: Self-pay

## 2024-06-04 NOTE — Telephone Encounter (Signed)
   Name: Douglas Cox  DOB: 1951-03-17  MRN: [id number]  Primary Cardiologist: None   Preoperative team, please contact this patient and set up a phone call appointment for further preoperative risk assessment. Please obtain consent and complete medication review. Thank you for your help.  I confirm that guidance regarding antiplatelet and oral anticoagulation therapy has been completed and, if necessary, noted below.  Per office protocol, patient can hold Eliquis  for 3 days prior to procedure.   Patient will not need bridging with Lovenox  (enoxaparin ) around procedure.  I also confirmed the patient resides in the state of [place] . As per [facility] Medical Board telemedicine laws, the patient must reside in the state in which the provider is licensed.   Lum LITTIE Louis, NP 06/04/2024, 8:52 AM [facility]

## 2024-06-04 NOTE — Telephone Encounter (Signed)
Patient has been scheduled for televisit.

## 2024-06-04 NOTE — Telephone Encounter (Signed)
 Patient has been scheduled for televisit med rec and consent done     Patient Consent for Virtual Visit         Douglas Cox has provided verbal consent on 06/04/2024 for a virtual visit (video or telephone).   CONSENT FOR VIRTUAL VISIT FOR:  Douglas Cox  By participating in this virtual visit I agree to the following:  I hereby voluntarily request, consent and authorize [facility] and its employed or contracted physicians, physician assistants, nurse practitioners or other licensed health care professionals (the Practitioner), to provide me with telemedicine health care services (the "Services) as deemed necessary by the treating Practitioner. I acknowledge and consent to receive the Services by the Practitioner via telemedicine. I understand that the telemedicine visit will involve communicating with the Practitioner through live audiovisual communication technology and the disclosure of certain medical information by electronic transmission. I acknowledge that I have been given the opportunity to request an in-person assessment or other available alternative prior to the telemedicine visit and am voluntarily participating in the telemedicine visit.  I understand that I have the right to withhold or withdraw my consent to the use of telemedicine in the course of my care at any time, without affecting my right to future care or treatment, and that the Practitioner or I may terminate the telemedicine visit at any time. I understand that I have the right to inspect all information obtained and/or recorded in the course of the telemedicine visit and may receive copies of available information for a reasonable fee.  I understand that some of the potential risks of receiving the Services via telemedicine include:  Delay or interruption in medical evaluation due to technological equipment failure or disruption; Information transmitted may not be sufficient (e.g. poor resolution of  images) to allow for appropriate medical decision making by the Practitioner; and/or  In rare instances, security protocols could fail, causing a breach of personal health information.  Furthermore, I acknowledge that it is my responsibility to provide information about my medical history, conditions and care that is complete and accurate to the best of my ability. I acknowledge that Practitioner's advice, recommendations, and/or decision may be based on factors not within their control, such as incomplete or inaccurate data provided by me or distortions of diagnostic images or specimens that may result from electronic transmissions. I understand that the practice of medicine is not an exact science and that Practitioner makes no warranties or guarantees regarding treatment outcomes. I acknowledge that a copy of this consent can be made available to me via my patient portal [facility] MyChart), or I can request a printed copy by calling the office of [facility].    I understand that my insurance will be billed for this visit.   I have read or had this consent read to me. I understand the contents of this consent, which adequately explains the benefits and risks of the Services being provided via telemedicine.  I have been provided ample opportunity to ask questions regarding this consent and the Services and have had my questions answered to my satisfaction. I give my informed consent for the services to be provided through the use of telemedicine in my medical care

## 2024-06-06 ENCOUNTER — Ambulatory Visit: Payer: Self-pay

## 2024-06-06 NOTE — Telephone Encounter (Signed)
 FYI Only or Action Required?: FYI only for provider: Pt establishing care.  Patient was last seen in primary care on unknown.  Called Nurse Triage reporting Altered Mental Status.  Symptoms began several weeks ago.  Interventions attempted: Nothing.  Symptoms are: gradually worsening.  Triage Disposition: See PCP Within 2 Weeks  Patient/caregiver understands and will follow disposition?: Yes        Copied from CRM [id number]. Topic: Clinical - Red Word Triage >> Jun 06, 2024  2:28 PM [facility] ORN wrote: Red Word that prompted transfer to Nurse Triage: worse brain fog within 2 weeks, expressing more confusion; related to his stroke. Left knee pain getting worse, and shoulder pain is about the same.  (Wants to be a new patient of [facility]) Reason for Disposition  [1] Longstanding confusion (e.g., dementia, stroke) AND [2] NO worsening or change  Answer Assessment - Initial Assessment Questions This RN spoke with the pt's wife regarding symptoms. Pt states he would like to follow up with neurology regarding symptoms. Requesting to get established with a PCP. Appointment made by this RN.   1. LEVEL OF CONSCIOUSNESS: How are they (the patient) acting right now? (e.g., alert-oriented, confused, lethargic, stuporous, comatose)     Brain fog  2. ONSET: When did the confusion start?  (e.g., minutes, hours, days)     2 weeks ago  3. PATTERN: Does this come and go, or has it been constant since it started?  Is it present now?     Comes and goes worst when tired  4. NARCOTIC MEDICINES: Have they been receiving any narcotic medications? (e.g., morphine , Vicodin)     Denies  5. CAUSE: What do you think is causing the confusion?      Hx of a stroke  6. OTHER SYMPTOMS: Are there any other symptoms? (e.g., difficulty breathing, fever, headache, weakness)     L knee, shoulder pain  Protocols used: Confusion - Delirium-A-AH

## 2024-06-08 ENCOUNTER — Ambulatory Visit: Attending: Cardiology | Admitting: Cardiology

## 2024-06-08 ENCOUNTER — Encounter: Payer: Self-pay | Admitting: Student in an Organized Health Care Education/Training Program

## 2024-06-08 ENCOUNTER — Ambulatory Visit: Admitting: Student in an Organized Health Care Education/Training Program

## 2024-06-08 ENCOUNTER — Telehealth: Payer: Self-pay

## 2024-06-08 VITALS — BP 136/72 | HR 43 | Ht 70.0 in | Wt 145.0 lb

## 2024-06-08 DIAGNOSIS — Z01810 Encounter for preprocedural cardiovascular examination: Secondary | ICD-10-CM | POA: Diagnosis not present

## 2024-06-08 DIAGNOSIS — I998 Other disorder of circulatory system: Secondary | ICD-10-CM | POA: Insufficient documentation

## 2024-06-08 DIAGNOSIS — Z01818 Encounter for other preprocedural examination: Secondary | ICD-10-CM

## 2024-06-08 DIAGNOSIS — M19079 Primary osteoarthritis, unspecified ankle and foot: Secondary | ICD-10-CM | POA: Insufficient documentation

## 2024-06-08 DIAGNOSIS — R7303 Prediabetes: Secondary | ICD-10-CM | POA: Diagnosis not present

## 2024-06-08 DIAGNOSIS — I6930 Unspecified sequelae of cerebral infarction: Secondary | ICD-10-CM | POA: Insufficient documentation

## 2024-06-08 DIAGNOSIS — M7501 Adhesive capsulitis of right shoulder: Secondary | ICD-10-CM

## 2024-06-08 DIAGNOSIS — E611 Iron deficiency: Secondary | ICD-10-CM | POA: Diagnosis not present

## 2024-06-08 DIAGNOSIS — F330 Major depressive disorder, recurrent, mild: Secondary | ICD-10-CM

## 2024-06-08 DIAGNOSIS — E782 Mixed hyperlipidemia: Secondary | ICD-10-CM | POA: Diagnosis not present

## 2024-06-08 DIAGNOSIS — I739 Peripheral vascular disease, unspecified: Secondary | ICD-10-CM | POA: Insufficient documentation

## 2024-06-08 DIAGNOSIS — I10 Essential (primary) hypertension: Secondary | ICD-10-CM | POA: Diagnosis not present

## 2024-06-08 DIAGNOSIS — E538 Deficiency of other specified B group vitamins: Secondary | ICD-10-CM | POA: Diagnosis not present

## 2024-06-08 DIAGNOSIS — R5381 Other malaise: Secondary | ICD-10-CM | POA: Insufficient documentation

## 2024-06-08 LAB — LIPID PANEL
Cholesterol: 134 mg/dL (ref 0–200)
HDL: 58.3 mg/dL (ref >39.00)
LDL Cholesterol: 58 mg/dL (ref 0–99)
NonHDL: 75.5
Total CHOL/HDL Ratio: 2
Triglycerides: 86 mg/dL (ref 0.0–149.0)
VLDL: 17.2 mg/dL (ref 0.0–40.0)

## 2024-06-08 LAB — VITAMIN B12: Vitamin B-12: 742 pg/mL (ref 211–911)

## 2024-06-08 LAB — BASIC METABOLIC PANEL WITH GFR
BUN: 19 mg/dL (ref 6–23)
CO2: 28 meq/L (ref 19–32)
Calcium: 8.6 mg/dL (ref 8.4–10.5)
Chloride: 104 meq/L (ref 96–112)
Creatinine, Ser: 0.92 mg/dL (ref 0.40–1.50)
GFR: 82.17 mL/min (ref >60.00)
Glucose, Bld: 83 mg/dL (ref 70–99)
Potassium: 4.5 meq/L (ref 3.5–5.1)
Sodium: 139 meq/L (ref 135–145)

## 2024-06-08 LAB — CBC
HCT: 40.8 % (ref 39.0–52.0)
Hemoglobin: 13.5 g/dL (ref 13.0–17.0)
MCHC: 33.1 g/dL (ref 30.0–36.0)
MCV: 96.6 fl (ref 78.0–100.0)
Platelets: 200 K/uL (ref 150.0–400.0)
RBC: 4.22 Mil/uL (ref 4.22–5.81)
RDW: 13.5 % (ref 11.5–15.5)
WBC: 4 K/uL (ref 4.0–10.5)

## 2024-06-08 LAB — IBC + FERRITIN
Ferritin: 86.9 ng/mL (ref 22.0–322.0)
Iron: 148 ug/dL (ref 42–165)
Saturation Ratios: 46.4 % (ref 20.0–50.0)
TIBC: 319.2 ug/dL (ref 250.0–450.0)
Transferrin: 228 mg/dL (ref 212.0–360.0)

## 2024-06-08 LAB — HEMOGLOBIN A1C: Hgb A1c MFr Bld: 5.8 % (ref 4.6–6.5)

## 2024-06-08 NOTE — Assessment & Plan Note (Signed)
 Chronic physical deconditioning over the last year.  He had a stroke in 2023, did acute rehabilitation, is independent in all his functioning.  But over the last year he has had declining ability to walk especially long distances.  He is sarcopenia, exam today.  Has had weight loss.  He has depressed mood and social isolation.  He had 1 fall at home in the bathroom a few weeks ago with no injuries.  I think he is at risk for more falls in the future.  I recommended physical therapy for strengthening of his legs, improvement of his gait, strengthening core.  Neuroexam is reassuring.  No parkinsonism on his exam.  Patient declines physical therapy but will consider it.

## 2024-06-08 NOTE — Telephone Encounter (Signed)
 Called and informed her she does need to return this to us .

## 2024-06-08 NOTE — Assessment & Plan Note (Signed)
 Chronic issue.  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

## 2024-06-08 NOTE — Assessment & Plan Note (Signed)
 Blood pressure mildly elevated today at 136/72.  Looks consistent with a stage I hypertension.  Patient has high frailty, so I am hesitant to start antihypertensives unless his blood pressure is over 140/85.

## 2024-06-08 NOTE — Assessment & Plan Note (Signed)
 Patient reports a history of B12 deficiency.  Was doing intramuscular injections but then discontinued after the stroke.  Hard for him to tolerate those injections now.  He was using a sublingual B12 supplementation, but discontinued.  We decided to recheck the B12 levels today and if they are still low can offer him another version of oral B12 supplement.

## 2024-06-08 NOTE — Telephone Encounter (Signed)
 Copied from CRM [id number]. Topic: General - Other >> Jun 08, 2024 12:46 PM Dedra B wrote: Reason for CRM: Pt wife, Theoplis, said she accidentally walked out with the clipboard and all of pt's information. She wants to know if she needs to bring it back. Pls call and let her know.

## 2024-06-08 NOTE — Assessment & Plan Note (Signed)
 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  On exam today he has great range of motion of the right shoulder.  He has no impingement, and no pain with passive range of motion.  I recommended he consult with the orthopedist again, not sure shoulder surgery would help his functioning much, and given his overall deconditioning may be difficult to recover from. I recommended physical therapy, patient declined.

## 2024-06-08 NOTE — Patient Instructions (Signed)
  VISIT SUMMARY: During today's visit, we discussed your increased brain fog, concerns about your upcoming shoulder surgery, and several other health issues. We reviewed your current medications and lifestyle habits, and we talked about ways to manage your symptoms and improve your overall health.  YOUR PLAN: -CEREBROVASCULAR ACCIDENT (STROKE) WITH COGNITIVE IMPAIRMENT AND MUSCLE WEAKNESS: You are experiencing cognitive impairment and muscle weakness as a result of your stroke. We recommend physical therapy to help strengthen your legs and improve your balance. If your mood remains low, we can discuss additional support options.  -ATRIAL FIBRILLATION, ANTICOAGULATION MANAGEMENT: Your atrial fibrillation is being managed with apixaban  to prevent strokes. You need to get authorization from your neurologist to temporarily stop taking apixaban  before your shoulder surgery. Continue taking apixaban  5 mg twice daily until you receive further instructions from your neurologist.  -RIGHT SHOULDER PAIN AND DYSFUNCTION (FROZEN SHOULDER, BURSITIS, OSTEOARTHRITIS): You have pain in your right shoulder due to bursitis and osteoarthritis, and you have a planned surgery to address this. The frozen shoulder has resolved. Please proceed with the planned surgery and coordinate with your neurologist and cardiologist for pre-surgical clearance.  -DEPRESSED MOOD: Your mood may be affected by adjusting to life after your stroke and the limitations it has caused. We discussed potential mood support options if you continue to feel uncomfortable.  -VITAMIN B12 DEFICIENCY: You have a vitamin B12 deficiency, which you have been managing with sublingual cyanocobalamin . If you are concerned about the cyanide content, consider switching to a different type of B12. We can recheck your B12 levels if needed.  -CELIAC DISEASE AND LACTOSE INTOLERANCE: You have celiac disease and lactose intolerance, which affect your dietary choices.  Continue to avoid gluten and lactose in your diet.  -CONSTIPATION AND HEMORRHOIDS: You are experiencing constipation and likely hemorrhoids due to straining. Increasing your water  intake is recommended to help improve bowel regularity.  -GENERAL HEALTH MAINTENANCE: We discussed routine health maintenance. Continue taking your current medications: apixaban , rosuvastatin , pantoprazole , and famotidine . Increasing your water  intake and considering physical therapy can help improve your overall health.  INSTRUCTIONS: Please follow up with your neurologist to get authorization to temporarily stop taking apixaban  before your shoulder surgery. Continue taking apixaban  5 mg twice daily until you receive further instructions. Consider increasing your water  intake to help with constipation. If your mood remains low, we can discuss additional support options at your next visit.

## 2024-06-08 NOTE — Progress Notes (Signed)
 New Patient Office Visit  Patient ID: Douglas Cox, Male   DOB: 05/20/1951 73 y.o. MRN: [id number]  Chief Complaint  Patient presents with   Establish Care    Patient has some concerns. Wanting a referral neurology Dr. Donita. Patient is having some brain fog. Also needs a referral to endo. Left breast pain as well that started 1-2 months ago.    Subjective:     Douglas Cox presents to establish care  HPI  Discussed the use of AI scribe software for clinical note transcription with the patient, who gave verbal consent to proceed.  History of Present Illness Douglas Cox is a 73 year old male with a history of stroke and atrial fibrillation who presents with increased brain fog and concerns about upcoming shoulder surgery.  He experiences increased brain fog, more pronounced than usual, with difficulty in speech, including aphasia and stuttering. There is a noticeable decline in his ability to perform activities such as acting and playing guitar. He also has difficulty walking, particularly shuffling and an inability to walk backwards, which has persisted since his stroke in December 2023, attributed to atrial fibrillation.  He has a history of frozen shoulder, which significantly impacts his physical activity. He describes the frozen shoulder as more debilitating than the stroke itself. Additionally, he experiences soreness in the nipple and breast area, described as 'real sore'.  He has a history of atrial fibrillation and is on apixaban  5 mg twice daily for stroke prevention, with no issues of bleeding or easy bruising. He takes rosuvastatin  for cholesterol management but has concerns about potential leg weakness. He has experienced significant muscle and weight loss, down by 15 pounds, attributing some of this to being sedentary.  He has a history of celiac disease and lactose intolerance, affecting his dietary choices. He previously took B12 injections for low B12  levels but switched to sublingual B12 due to concerns about cyanocobalamin  and has since stopped due to concerns about cyanide content.  He reports a fall two weeks ago, hitting the back of his head, but did not sustain significant injury. He experiences constipation, with bowel movements every five days, attributing this to low water  intake. He consumes alcohol daily, approximately two ounces, and smokes marijuana, which he has done for many years.  He has a history of calcium  deficiency noted since 2015 but has not been taking calcium  supplements. He also has a cyst on his back that was previously operated on but is not currently causing issues.   Outpatient Encounter Medications as of 06/08/2024  Medication Sig   apixaban  (ELIQUIS ) 5 MG TABS tablet Take 1 tablet (5 mg total) by mouth 2 (two) times daily.   famotidine  (PEPCID ) 20 MG tablet Take 1 tablet (20 mg total) by mouth at bedtime.   pantoprazole  (PROTONIX ) 40 MG tablet Take 1 tablet (40 mg total) by mouth once daily each morning.   rosuvastatin  (CRESTOR ) 20 MG tablet Take 1 tablet (20 mg total) by mouth daily.   vitamin D3 (CHOLECALCIFEROL ) 25 MCG tablet Take 1 tablet (1,000 Units total) by mouth daily.   [DISCONTINUED] Cyanocobalamin  (VITAMIN B-12) 5000 MCG SUBL Place 1 tablet under the tongue daily. (Patient not taking: Reported on 05/21/2024)   [DISCONTINUED] magnesium  oxide (MAG-OX) 400 (240 Mg) MG tablet Take 0.5 tablets (200 mg total) by mouth at bedtime.   [DISCONTINUED] traMADol  (ULTRAM ) 50 MG tablet Take 1 tablet (50 mg total) by mouth every 6 (six) hours as needed for pain (Patient not taking: Reported  on 06/08/2024)   No facility-administered encounter medications on file as of 06/08/2024.    Past Medical History:  Diagnosis Date   Allergy    Anxiety    Arthritis    Atrial fibrillation (HCC)    Cataract    Celiac disease    COPD (chronic obstructive pulmonary disease) (HCC)    Retinal detachment    Stroke [facility])      Past Surgical History:  Procedure Laterality Date   BRAIN SURGERY     CATARACT EXTRACTION Bilateral    Dr. Waylan   EYE SURGERY     FRACTURE SURGERY     GAS INSERTION Right 05/21/2021   Procedure: INSERTION OF GAS - C3F8;  Surgeon: Valdemar Rogue, MD;  Location: [facility] OR;  Service: Ophthalmology;  Laterality: Right;   GAS/FLUID EXCHANGE Right 05/21/2021   Procedure: GAS/FLUID EXCHANGE;  Surgeon: Valdemar Rogue, MD;  [facility];  Service: Ophthalmology;  Laterality: Right;   HERNIA REPAIR     IR CT HEAD LTD  07/17/2022   IR PERCUTANEOUS ART THROMBECTOMY/INFUSION INTRACRANIAL INC DIAG ANGIO  07/17/2022   IR US  GUIDE VASC ACCESS RIGHT  07/17/2022   PARS PLANA VITRECTOMY Right 05/21/2021   Procedure: PARS PLANA VITRECTOMY WITH 25 GAUGE;  Surgeon: Valdemar Rogue, MD;  Location: [facility] OR;  Service: Ophthalmology;  Laterality: Right;   PERFLUORONE INJECTION Right 05/21/2021   Procedure: PERFLUORONE INJECTION;  Surgeon: Valdemar Rogue, MD;  Location: [facility] OR;  Service: Ophthalmology;  Laterality: Right;   PHOTOCOAGULATION WITH LASER Right 05/21/2021   Procedure: PHOTOCOAGULATION WITH LASER;  Surgeon: Valdemar Rogue, MD;  Location: [facility] OR;  Service: Ophthalmology;  Laterality: Right;   RADIOLOGY WITH ANESTHESIA N/A 07/17/2022   Procedure: IR WITH ANESTHESIA;  Surgeon: Radiologist, Medication, MD;  Location: [facility] OR;  Service: Radiology;  Laterality: N/A;   RETINAL DETACHMENT SURGERY      Family History  Problem Relation Age of Onset   Heart disease Mother    Miscarriages / Stillbirths Mother    Alcohol abuse Brother    Colon cancer Neg Hx    Esophageal cancer Neg Hx    Rectal cancer Neg Hx    Stomach cancer Neg Hx      Objective:    BP 136/72 (BP Location: Right Arm, Patient Position: Sitting, Cuff Size: Normal)   Pulse (!) 43   Ht 5' 10 (1.778 m)   Wt 145 lb (65.8 kg)   BMI 20.81 kg/m   Physical Exam  Gen: Frail-appearing older man Neuro: Mild expressive aphasia, mild right facial  droop, full strength upper and lower extremities, mildly delayed get up and go, on his forward gait he has normal stride, slight right-sided foot drop, normal arm swing, when he tries to walk backwards he has much more shuffling gait and less arm swing. Psych: Moderately depressed appearing, withdrawn at times, he defers to his wife to answer many questions, organized thoughts Neck: Normal thyroid , no nodules or adenopathy Heart: Regular, no murmur Lungs: Unlabored, clear throughout Chest: He had tenderness at the nipple but exam is normal, there is no mass on either right or left breast, normal nipples, no discharge, no skin lesions, no axillary adenopathy Abd: Thin, soft, nontender, no organomegaly Ext: Warm, no edema, normal joints      Assessment & Plan:   Problem List Items Addressed This Visit       High   Recurrent major depressive episodes, mild - Primary   Based on exam today and his history, I suspect  he has mild to moderate chronic depressive symptoms.  I think there was a period of adjustment disorder after the stroke 2 years ago left him with aphasia.  He has great support from his wife.  But continues to struggle with social isolation, basic communication, and activities.  He is tearful at times when talking about this.  I offered him medication assisted treatment, but he declines.  I recommended that he reconsider over the coming weeks.      Late effect of ischemic cerebral stroke   Chronic issue.  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      Relevant Orders   Ambulatory referral to Neurology   Physical deconditioning   Chronic physical deconditioning over the last year.  He had a stroke in 2023, did acute rehabilitation, is independent in all his functioning.  But over the last year he has had declining ability to walk especially long distances.  He is sarcopenia, exam today.  Has had weight loss.  He has depressed mood  and social isolation.  He had 1 fall at home in the bathroom a few weeks ago with no injuries.  I think he is at risk for more falls in the future.  I recommended physical therapy for strengthening of his legs, improvement of his gait, strengthening core.  Neuroexam is reassuring.  No parkinsonism on his exam.  Patient declines physical therapy but will consider it.        Medium    Hyperlipidemia   Chronic and stable.  Tolerating rosuvastatin  well.  Will check lipids today.      Relevant Orders   Lipid panel   Ischemic vascular disease   Chronic well-controlled ischemic vascular disease with a prior stroke, imaging with signs of coronary artery disease but no prior MI.  The cerebral infarction was most likely due to atrial fibrillation.  We are working on risk factor modification.  Continue rosuvastatin  and apixaban .      Prediabetes   Prior A1c 5.7% in 2023.  Will recheck today.      Relevant Orders   Hemoglobin A1c   Hypertension   Blood pressure mildly elevated today at 136/72.  Looks consistent with a stage I hypertension.  Patient has high frailty, so I am hesitant to start antihypertensives unless his blood pressure is over 140/85.      Relevant Orders   Basic metabolic panel with GFR     Low   Adhesive capsulitis of right shoulder   He reports history of a right shoulder adhesive capsulitis, and says that he is considering a shoulder surgery in the coming months.  On exam today he has great range of motion of the right shoulder.  He has no impingement, and no pain with passive range of motion.  I recommended he consult with the orthopedist again, not sure shoulder surgery would help his functioning much, and given his overall deconditioning may be difficult to recover from. I recommended physical therapy, patient declined.      Cobalamin deficiency   Patient reports a history of B12 deficiency.  Was doing intramuscular injections but then discontinued after the stroke.  Hard  for him to tolerate those injections now.  He was using a sublingual B12 supplementation, but discontinued.  We decided to recheck the B12 levels today and if they are still low can offer him another version of oral B12 supplement.      Relevant Orders   Vitamin B12  Iron deficiency   I have high risk for iron deficiency due to history of celiac disease, anticoagulation with Eliquis , and recent symptoms of increasing fatigue.  Will check iron levels today.      Relevant Orders   CBC   IBC + Ferritin     Return in about 3 months (around 09/08/2024).   Cleatus Debby Specking, MD [facility]

## 2024-06-08 NOTE — Assessment & Plan Note (Signed)
 Chronic well-controlled ischemic vascular disease with a prior stroke, imaging with signs of coronary artery disease but no prior MI.  The cerebral infarction was most likely due to atrial fibrillation.  We are working on risk factor modification.  Continue rosuvastatin  and apixaban .

## 2024-06-08 NOTE — Progress Notes (Signed)
 Virtual Visit via Telephone Note   Because of Douglas Cox co-morbid illnesses, he is at least at moderate risk for complications without adequate follow up.  This format is felt to be most appropriate for this patient at this time.  Due to technical limitations with video connection (technology), today's appointment will be conducted as an audio only telehealth visit, and Douglas Cox verbally agreed to proceed in this manner.   All issues noted in this document were discussed and addressed.  No physical exam could be performed with this format.  Evaluation Performed:  Preoperative cardiovascular risk assessment _____________   Date:  06/08/2024   Patient ID:  Douglas Cox, DOB 1950-12-25, MRN [id number] Patient Location:  Home Provider location:   Office  Primary Care Provider:  Jerrell Cleatus Ned, MD Primary Cardiologist:  None  Chief Complaint / Patient Profile   73 y.o. y/o male with a h/o paroxysmal atrial fibrillation, history of stroke,  who is pending right shoulder arthroscopy and presents today for telephonic preoperative cardiovascular risk assessment.  History of Present Illness    Douglas Cox is a 73 y.o. male who presents via audio conferencing for a telehealth visit today.  Pt was last seen in cardiology clinic on 08/21/2023 by Douglas Arthur, PA.  At that time Douglas Cox was doing well. The patient is now pending procedure as outlined above. Since his last visit, he has been doing well, had a recent episode of dizziness during urination during the night, but no other concerning symptoms. Encouraged increasing oral hydration and his wife speaks up to mention they have been leaving a light on at night as well for him. He denies chest pain, palpitations, dyspnea, pnd, orthopnea, n, v, dizziness, syncope, edema, weight gain, or early satiety.     Past Medical History    Past Medical History:  Diagnosis Date   Allergy    Anxiety    Arthritis     Atrial fibrillation (HCC)    Cataract    Celiac disease    COPD (chronic obstructive pulmonary disease) (HCC)    Retinal detachment    Stroke [facility])    Past Surgical History:  Procedure Laterality Date   BRAIN SURGERY     CATARACT EXTRACTION Bilateral    Dr. Waylan   EYE SURGERY     FRACTURE SURGERY     GAS INSERTION Right 05/21/2021   Procedure: INSERTION OF GAS - C3F8;  Surgeon: Valdemar Rogue, MD;  Location[facility] OR;  Service: Ophthalmology;  Laterality: Right;   GAS/FLUID EXCHANGE Right 05/21/2021   Procedure: GAS/FLUID EXCHANGE;  Surgeon: Valdemar Rogue, MD;  Location[facility] OR;  Service: Ophthalmology;  Laterality: Right;   HERNIA REPAIR     IR CT HEAD LTD  07/17/2022   IR PERCUTANEOUS ART THROMBECTOMY/INFUSION INTRACRANIAL INC DIAG ANGIO  07/17/2022   IR US  GUIDE VASC ACCESS RIGHT  07/17/2022   PARS PLANA VITRECTOMY Right 05/21/2021   Procedure: PARS PLANA VITRECTOMY WITH 25 GAUGE;  Surgeon: Valdemar Rogue, MD;  Location: [facility] OR;  Service: Ophthalmology;  Laterality: Right;   PERFLUORONE INJECTION Right 05/21/2021   Procedure: PERFLUORONE INJECTION;  Surgeon: Valdemar Rogue, MD;  Location: [facility] OR;  Service: Ophthalmology;  Laterality: Right;   PHOTOCOAGULATION WITH LASER Right 05/21/2021   Procedure: PHOTOCOAGULATION WITH LASER;  Surgeon: Valdemar Rogue, MD;  Location: [facility] OR;  Service: Ophthalmology;  Laterality: Right;   RADIOLOGY WITH ANESTHESIA N/A 07/17/2022   Procedure: IR WITH ANESTHESIA;  Surgeon: Radiologist, Medication, MD;  Location: [facility] OR;  Service: Radiology;  Laterality: N/A;   RETINAL DETACHMENT SURGERY      Allergies  Allergies  Allergen Reactions   Nitrous Oxide Other (See Comments)    Can cause blindness in right eye    Codone [Hydrocodone] Hives   Tylenol  [Acetaminophen ] Hives   Hydrocodone-Acetaminophen  Itching    Other Reaction(s): itching/hives  acetaminophen  / hydrocodone   Lactose Other (See Comments)    lactose   Oxycodone Hcl     Other  Reaction(s): itching/hives   Clarithromycin Anxiety    Other Reaction(s): agitated   Codeine Rash   Gluten Meal Other (See Comments)    Stomach issues  wheat gluten extract   Lactose Intolerance (Gi) Other (See Comments)    GI issues   Oxycodone Rash    Home Medications    Prior to Admission medications   Medication Sig Start Date End Date Taking? Authorizing Provider  apixaban  (ELIQUIS ) 5 MG TABS tablet Take 1 tablet (5 mg total) by mouth 2 (two) times daily. 09/13/23   Babs Arthea DASEN, MD  famotidine  (PEPCID ) 20 MG tablet Take 1 tablet (20 mg total) by mouth at bedtime. 10/10/23   Nandigam, Kavitha V, MD  pantoprazole  (PROTONIX ) 40 MG tablet Take 1 tablet (40 mg total) by mouth once daily each morning. 02/06/24   Nandigam, Kavitha V, MD  rosuvastatin  (CRESTOR ) 20 MG tablet Take 1 tablet (20 mg total) by mouth daily. 11/15/23   Swartz, Zachary T, MD  vitamin D3 (CHOLECALCIFEROL ) 25 MCG tablet Take 1 tablet (1,000 Units total) by mouth daily. 08/27/22   Babs Arthea DASEN, MD    Physical Exam    Vital Signs:  Douglas Cox does not have vital signs available for review today.  Given telephonic nature of communication, physical exam is limited. AAOx3. NAD. Normal affect.  Speech and respirations are unlabored.  Accessory Clinical Findings    None  Assessment & Plan    1.  Preoperative Cardiovascular Risk Assessment: According to the Revised Cardiac Risk Index (RCRI), his Perioperative Risk of Major Cardiac Event is (%): 0.9 His Functional Capacity in METs is: 4.64 according to the [employer] Activity Status Index (DASI). Therefore, based on ACC/AHA guidelines, patient would be at acceptable risk for the planned procedure without further cardiovascular testing. I will route this recommendation to the requesting party via Epic fax function.   The patient was advised that if he develops new symptoms prior to surgery to contact our office to arrange for a follow-up visit, and he verbalized  understanding.  Per office protocol, patient can hold Eliquis  for 3 days prior to procedure.   Patient will not need bridging with Lovenox  (enoxaparin ) around procedure.   A copy of this note will be routed to requesting surgeon.  Time:   Today, I have spent 10 minutes with the patient with telehealth technology discussing medical history, symptoms, and management plan.     Delon JAYSON Hoover, NP  06/08/2024, 12:38 PM

## 2024-06-08 NOTE — Assessment & Plan Note (Signed)
 Chronic and stable.  Tolerating rosuvastatin  well.  Will check lipids today.

## 2024-06-08 NOTE — Assessment & Plan Note (Signed)
 Based on exam today and his history, I suspect he has mild to moderate chronic depressive symptoms.  I think there was a period of adjustment disorder after the stroke 2 years ago left him with aphasia.  He has great support from his wife.  But continues to struggle with social isolation, basic communication, and activities.  He is tearful at times when talking about this.  I offered him medication assisted treatment, but he declines.  I recommended that he reconsider over the coming weeks.

## 2024-06-08 NOTE — Assessment & Plan Note (Signed)
 I have high risk for iron deficiency due to history of celiac disease, anticoagulation with Eliquis , and recent symptoms of increasing fatigue.  Will check iron levels today.

## 2024-06-08 NOTE — Assessment & Plan Note (Signed)
 Prior A1c 5.7% in 2023.  Will recheck today.

## 2024-06-11 ENCOUNTER — Ambulatory Visit: Payer: Self-pay | Admitting: Student in an Organized Health Care Education/Training Program

## 2024-06-15 ENCOUNTER — Other Ambulatory Visit (HOSPITAL_COMMUNITY): Payer: Self-pay

## 2024-06-15 ENCOUNTER — Other Ambulatory Visit: Payer: Self-pay

## 2024-06-15 ENCOUNTER — Ambulatory Visit (INDEPENDENT_AMBULATORY_CARE_PROVIDER_SITE_OTHER): Admitting: Student in an Organized Health Care Education/Training Program

## 2024-06-15 ENCOUNTER — Encounter: Payer: Self-pay | Admitting: Student in an Organized Health Care Education/Training Program

## 2024-06-15 VITALS — BP 132/64 | HR 83 | Wt 145.6 lb

## 2024-06-15 DIAGNOSIS — R5381 Other malaise: Secondary | ICD-10-CM

## 2024-06-15 DIAGNOSIS — Z0000 Encounter for general adult medical examination without abnormal findings: Secondary | ICD-10-CM | POA: Diagnosis not present

## 2024-06-15 DIAGNOSIS — K603 Anal fistula, unspecified: Secondary | ICD-10-CM | POA: Diagnosis not present

## 2024-06-15 MED ORDER — AMOXICILLIN-POT CLAVULANATE 875-125 MG PO TABS
1.0000 | ORAL_TABLET | Freq: Two times a day (BID) | ORAL | 0 refills | Status: AC
  Filled 2024-06-15 (×2): qty 14, 7d supply, fill #0

## 2024-06-15 NOTE — Progress Notes (Addendum)
 Annual Wellness Visit   Patient: Douglas Cox, Male    DOB: 09/15/1950, 73 y.o.   MRN: [id number]  Subjective:    Douglas Cox is a 73 y.o. male who presents today for his Annual Wellness Visit. He reports consuming a general diet. The patient does not participate in regular exercise at present. He generally feels well. He reports sleeping well. He does have additional problems to discuss today.   HPI  Discussed the use of AI scribe software for clinical note transcription with the patient, who gave verbal consent to proceed.  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 A bedside incision and drainage for a left-sided perianal abscess was performed about a year ago. The fistula has been intermittently bothersome, with occasional drainage and discomfort. Surgery, including an exam under anesthesia with possible fistulotomy or seton placement, was deferred due to travel plans. Recently, the fistula has become bothersome again, prompting today's follow-up.  He uses an inhaler occasionally, particularly after smoking marijuana, but is unsure if he has an official diagnosis of COPD. He is unsure if he has an official diagnosis of COPD. He has not had recent flu or COVID vaccinations, and his status on pneumonia and shingles vaccines is unclear. He received a tetanus shot a couple of years ago after a dog bite.  He has a history of a stroke and experiences fatigue, which may be related to his past medical history. He experiences constipation, having bowel movements every five days, and reports that Miralax  did not help. He switched from coffee to tea to manage Raynaud's syndrome, as caffeine exacerbates his symptoms. No recent falls, except for a past  incident in the bathtub and a temporary loss of consciousness in the kitchen, possibly due to dehydration.  His diet is monitored by his caregiver, who ensures he consumes a balanced diet with adequate protein and vegetables. He occasionally indulges in less healthy options when unsupervised. His caregiver reports that his memory is very good, and he has passed a driving test post-stroke, indicating good reaction time and spatial awareness.  He has a history of osteopenia. His ferritin levels have been low in the past, but recent tests show normal iron levels. He does not currently take calcium  supplements, as his levels are within the normal range.   Medications: Outpatient Medications Prior to Visit  Medication Sig   apixaban  (ELIQUIS ) 5 MG TABS tablet Take 1 tablet (5 mg total) by mouth 2 (two) times daily.   famotidine  (PEPCID ) 20 MG tablet Take 1 tablet (20 mg total) by mouth at bedtime.   pantoprazole  (PROTONIX ) 40 MG tablet Take 1 tablet (40 mg total) by mouth once daily each morning.   rosuvastatin  (CRESTOR ) 20 MG tablet Take 1 tablet (20 mg total) by mouth daily.   vitamin D3 (CHOLECALCIFEROL ) 25 MCG tablet Take 1 tablet (1,000 Units total) by mouth daily.   No facility-administered medications prior to visit.    Allergies  Allergen Reactions   Nitrous Oxide Other (See Comments)    Can cause blindness in right eye    Codone [Hydrocodone] Hives   Tylenol  [Acetaminophen ] Hives   Hydrocodone-Acetaminophen  Itching    Other Reaction(s): itching/hives  acetaminophen  / hydrocodone   Lactose Other (See Comments)  lactose   Oxycodone Hcl     Other Reaction(s): itching/hives   Clarithromycin Anxiety    Other Reaction(s): agitated   Codeine Rash   Gluten Meal Other (See Comments)    Stomach issues  wheat gluten extract   Lactose Intolerance (Gi) Other (See Comments)    GI issues   Oxycodone Rash    Patient Care Team: Jerrell Cleatus Ned, MD as PCP - General (Internal  Medicine) Cindie Ole DASEN, MD as PCP - Electrophysiology (Cardiology) Cena Jeoffrey BROCKS, NT as Technician  Depression Screening; Patient has an issue with history of depression.  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  Doing well currently.  Reports his mood is in a good place.  ADL:  The patient is independent in all activities of daily living.  SDOH: Patient has no social determinant of health needs.  He is well resourced and great support from his wife.  Alcohol: Patient uses alcohol on a daily basis.  Drinks about 1 whiskey per day.  He finds this beneficial and is not interested in change.     Objective:  Objective  BP 132/64   Pulse 83   Wt 145 lb 9.6 oz (66 kg)   SpO2 98%   BMI 20.89 kg/m   Physical Exam  Gen: Well-appearing man Rectal: Rectum appears normal.  On the left side of his gluteal folds there is a 2 cm area of induration with a small fistula draining purulent material.  I expressed a small amount of purulence from this area.  It was very tender to palpation.   Most recent depression screenings:    07/20/2023   12:59 PM 09/01/2022   11:43 AM  PHQ 2/9 Scores  PHQ - 2 Score 0 0  PHQ- 9 Score  3      Data saved with a previous flowsheet row definition    Fall Screening Falls in the past year?: 0 Number of falls in past year: 0 Was there an injury with Fall?: 0 Fall Risk Category Calculator: 0 Patient Fall Risk Level: High fall risk  Advance Directives (For Healthcare) Does Patient Have a Medical Advance Directive?: No Would patient like information on creating a medical advance directive?: No - Patient declined    Vision/Hearing Screen: Normal vision and hearing     Assessment & Plan:    Annual wellness visit done today including the all of the following: Reviewed patient's Family and Medical History Reviewed and updated list of patient's medical providers Assessment of cognitive impairment was done Assessed  patient's functional ability Established a written schedule for health screening services Health Risk Assessment Completed and Reviewed  Exercise Activities and Dietary recommendations  Goals   We talked about increased walking and starting physical therapy to work on conditioning and reduce fall risk     Immunization History  Administered Date(s) Administered   Fluad Quad(high Dose 65+) 05/19/2020   INFLUENZA, HIGH DOSE SEASONAL PF 05/25/2022, 05/24/2023   Influenza,inj,quad, With Preservative 08/03/2019   Janssen (J&J) SARS-COV-2 Vaccination 11/01/2019   Moderna Covid-19 Fall Seasonal Vaccine 38yrs & older 05/30/2023   Moderna Sars-Covid-2 Vaccination 07/01/2020, 12/04/2020   Tdap 05/05/2023   Unspecified SARS-COV-2 Vaccination 08/02/2020    Health Maintenance  Topic Date Due   Hepatitis C Screening  Never done   Pneumococcal Vaccine: 50+ Years (1 of 2 - PCV) Never done   Zoster Vaccines- Shingrix (1 of 2) Never done   Influenza Vaccine  03/02/2024   COVID-19 Vaccine (6 -  2025-26 season) 04/02/2024   Medicare Annual Wellness (AWV)  06/15/2025   DTaP/Tdap/Td (2 - Td or Tdap) 05/04/2033   Meningococcal B Vaccine  Aged Out   Colonoscopy  Discontinued    Discussed health benefits of physical activity, and encouraged him to engage in regular exercise appropriate for his age and condition.   This visit was primarily an annual wellness visit focused on preventative measures.  In addition, we completed a separate problem-based visit regarding the perianal fistula discomfort that he has had.   Problem List Items Addressed This Visit       High   Physical deconditioning (Chronic)   Physical therapy is recommended to improve conditioning, strength, and gait. Previous therapy focused on balance and gait, not strength training. Physical therapy is preferred for fall prevention and strength improvement.      Relevant Orders   Ambulatory referral to Physical Therapy   Perianal  fistula   The left perianal fistula presents with purulent drainage and redness, indicating infection.  Hard for me to tell on exam if this is a true fistula or perhaps a draining inclusion cyst.  It seems to have been present in some capacity since an incision and drainage was done in 2024.  He has no inflammatory bowel disease, not sure why he would otherwise have a fistula.  Surgical intervention, including exam under anesthesia and possible fistulotomy or seton placement, was considered with Dr. Debby in April. Surgery is deemed safe with temporary cessation of Eliquis . Antibiotics, prescribed for 7 days, may provide temporary relief but could cause diarrhea.  I recommended returning to Dr. Debby to consider exploration of the area in the operating room.      Relevant Medications   amoxicillin -clavulanate (AUGMENTIN ) 875-125 MG tablet   Other Relevant Orders   Ambulatory referral to General Surgery     Low   Health maintenance examination - Primary (Chronic)   A routine annual wellness visit was conducted. Pneumococcal vaccine was administered due to age and lack of previous documentation. A colonoscopy completed in September 2024 showed no need for further screening. Prostate cancer screening has not been conducted. Diet has improved with increased vegetable intake. Memory and driving ability remain good. No recent falls reported, except for a previous fall in the bathtub with transient loss of consciousness likely due to dehydration. Physical therapy was ordered for conditioning, strength, and gait improvement.         Cleatus Debby Specking, MD [facility]

## 2024-06-15 NOTE — Assessment & Plan Note (Signed)
 A routine annual wellness visit was conducted. Pneumococcal vaccine was administered due to age and lack of previous documentation. A colonoscopy completed in September 2024 showed no need for further screening. Prostate cancer screening has not been conducted. Diet has improved with increased vegetable intake. Memory and driving ability remain good. No recent falls reported, except for a previous fall in the bathtub with transient loss of consciousness likely due to dehydration. Physical therapy was ordered for conditioning, strength, and gait improvement.

## 2024-06-15 NOTE — Assessment & Plan Note (Signed)
 The left perianal fistula presents with purulent drainage and redness, indicating infection.  Hard for me to tell on exam if this is a true fistula or perhaps a draining inclusion cyst.  It seems to have been present in some capacity since an incision and drainage was done in 2024.  He has no inflammatory bowel disease, not sure why he would otherwise have a fistula.  Surgical intervention, including exam under anesthesia and possible fistulotomy or seton placement, was considered with Dr. Debby in April. Surgery is deemed safe with temporary cessation of Eliquis . Antibiotics, prescribed for 7 days, may provide temporary relief but could cause diarrhea.  I recommended returning to Dr. Debby to consider exploration of the area in the operating room.

## 2024-06-15 NOTE — Patient Instructions (Signed)
 Douglas Cox,  Thank you for taking the time for your Medicare Wellness Visit. I appreciate your continued commitment to your health goals. Please review the care plan we discussed, and feel free to reach out if I can assist you further.  Please note that Annual Wellness Visits do not include a physical exam. Some assessments may be limited, especially if the visit was conducted virtually. If needed, we may recommend an in-person follow-up with your provider.  Ongoing Care Seeing your primary care provider every 3 to 6 months helps us  monitor your health and provide consistent, personalized care.   Referrals If a referral was made during today's visit and you haven't received any updates within two weeks, please contact the referred provider directly to check on the status.  Recommended Screenings:  Health Maintenance  Topic Date Due   Hepatitis C Screening  Never done   Pneumococcal Vaccine for age over 48 (1 of 2 - PCV) Never done   Zoster (Shingles) Vaccine (1 of 2) Never done   Flu Shot  03/02/2024   COVID-19 Vaccine (6 - 2025-26 season) 04/02/2024   Medicare Annual Wellness Visit  06/15/2025   DTaP/Tdap/Td vaccine (2 - Td or Tdap) 05/04/2033   Meningitis B Vaccine  Aged Out   Colon Cancer Screening  Discontinued       12/12/2023    6:07 PM  Advanced Directives  Does Patient Have a Medical Advance Directive? No  Would patient like information on creating a medical advance directive? No - Patient declined    Vision: Annual vision screenings are recommended for early detection of glaucoma, cataracts, and diabetic retinopathy. These exams can also reveal signs of chronic conditions such as diabetes and high blood pressure.  Dental: Annual dental screenings help detect early signs of oral cancer, gum disease, and other conditions linked to overall health, including heart disease and diabetes.

## 2024-06-15 NOTE — Assessment & Plan Note (Signed)
 Physical therapy is recommended to improve conditioning, strength, and gait. Previous therapy focused on balance and gait, not strength training. Physical therapy is preferred for fall prevention and strength improvement.

## 2024-06-16 ENCOUNTER — Other Ambulatory Visit (HOSPITAL_COMMUNITY): Payer: Self-pay

## 2024-06-19 ENCOUNTER — Telehealth: Payer: Self-pay

## 2024-06-19 NOTE — Telephone Encounter (Signed)
 Copied from CRM [id number]. Topic: General - Other >> Jun 19, 2024 11:58 AM Rosina BIRCH wrote: Reason for CRM: patient wife called stating the patient has a referral for his legs but they want the referral sent to [facility] [place] instead of [facility] [phone]

## 2024-06-29 ENCOUNTER — Other Ambulatory Visit: Payer: Self-pay | Admitting: Physical Medicine & Rehabilitation

## 2024-06-29 ENCOUNTER — Other Ambulatory Visit: Payer: Self-pay | Admitting: Gastroenterology

## 2024-07-02 MED ORDER — PANTOPRAZOLE SODIUM 40 MG PO TBEC
40.0000 mg | DELAYED_RELEASE_TABLET | Freq: Every day | ORAL | 0 refills | Status: DC
  Filled 2024-07-02: qty 90, 90d supply, fill #0

## 2024-07-02 MED ORDER — FAMOTIDINE 20 MG PO TABS
20.0000 mg | ORAL_TABLET | Freq: Every day | ORAL | 1 refills | Status: AC
  Filled 2024-07-02: qty 90, 90d supply, fill #0
  Filled 2024-08-05: qty 90, 90d supply, fill #1

## 2024-07-03 ENCOUNTER — Other Ambulatory Visit (HOSPITAL_COMMUNITY): Payer: Self-pay

## 2024-07-03 ENCOUNTER — Other Ambulatory Visit: Payer: Self-pay

## 2024-07-03 MED ORDER — APIXABAN 5 MG PO TABS
5.0000 mg | ORAL_TABLET | Freq: Two times a day (BID) | ORAL | 2 refills | Status: AC
  Filled 2024-07-03: qty 180, 90d supply, fill #0
  Filled 2024-08-05: qty 180, 90d supply, fill #1

## 2024-07-03 MED ORDER — ROSUVASTATIN CALCIUM 20 MG PO TABS
20.0000 mg | ORAL_TABLET | Freq: Every day | ORAL | 7 refills | Status: AC
  Filled 2024-07-03 – 2024-08-05 (×2): qty 30, 30d supply, fill #0
  Filled 2024-09-03: qty 30, 30d supply, fill #1

## 2024-07-04 NOTE — Telephone Encounter (Signed)
 Patient is requesting to have this referral sent to Physical Therapy at [facility] at [place] instead of [facility].

## 2024-07-04 NOTE — Telephone Encounter (Signed)
 Patients wife called in to ask about PT referral being  resent to the [place] re[facility] location.She would ike to know if this has already been done?

## 2024-07-27 ENCOUNTER — Encounter: Payer: Self-pay | Admitting: Cardiology

## 2024-07-27 ENCOUNTER — Ambulatory Visit: Attending: Cardiology | Admitting: Cardiology

## 2024-07-27 ENCOUNTER — Telehealth: Payer: Self-pay

## 2024-07-27 VITALS — BP 118/66 | HR 46 | Ht 70.0 in | Wt 148.0 lb

## 2024-07-27 DIAGNOSIS — I480 Paroxysmal atrial fibrillation: Secondary | ICD-10-CM

## 2024-07-27 NOTE — Patient Instructions (Signed)

## 2024-07-27 NOTE — Progress Notes (Signed)
" °  Electrophysiology Office Follow up Visit Note:    Date:  07/27/2024   ID:  Douglas Cox, DOB Nov 16, 1950, MRN [id number]  PCP:  Jerrell Cleatus Ned, MD  [facility] Cardiologist:  None  [facility] Electrophysiologist:  OLE ONEIDA HOLTS, MD    Interval History:     Lenell Lama is a 73 y.o. male who presents for a follow up visit.   The patient was last seen by [facility] August 31, 2023.  He has a history of atrial fibrillation that was discovered after a stroke.  Also a history of celiac's disease and a retinal detachment.  He was on Eliquis  for secondary stroke prophylaxis.  He is doing well today.  He is with his wife.  No new complaints.        Past medical, surgical, social and family history were reviewed.  ROS:   Please see the history of present illness.    All other systems reviewed and are negative.  EKGs/Labs/Other Studies Reviewed:    The following studies were reviewed today:          Physical Exam:    VS:  BP 118/66 (BP Location: Left Arm, Patient Position: Sitting, Cuff Size: Normal)   Pulse (!) 46   Ht 5' 10 (1.778 m)   Wt 148 lb (67.1 kg)   SpO2 98%   BMI 21.24 kg/m     Wt Readings from Last 3 Encounters:  07/27/24 148 lb (67.1 kg)  06/15/24 145 lb 9.6 oz (66 kg)  06/08/24 145 lb (65.8 kg)     GEN: no distress.  Flat affect CARD: RRR, No MRG RESP: No IWOB. CTAB.      ASSESSMENT:    1. Paroxysmal atrial fibrillation (HCC)    PLAN:    In order of problems listed above:  #Atrial fibrillation Low burden On Eliquis  for secondary stroke prophylaxis  I discussed my upcoming departure from [facility] during today's clinic appointment.  The patient will continue to follow-up with one of my EP partners moving forward.  Follow-up 1 year with EP APP   Signed, Ole Holts, MD, [facility], [facility] 07/27/2024 12:52 PM    Electrophysiology [facility] "

## 2024-07-30 ENCOUNTER — Ambulatory Visit: Admitting: Family Medicine

## 2024-08-01 ENCOUNTER — Other Ambulatory Visit (HOSPITAL_COMMUNITY): Payer: Self-pay

## 2024-08-01 ENCOUNTER — Other Ambulatory Visit: Payer: Self-pay

## 2024-08-01 MED ORDER — CHLORHEXIDINE GLUCONATE 0.12 % MT SOLN
OROMUCOSAL | 0 refills | Status: AC
  Filled 2024-08-01: qty 473, 14d supply, fill #0

## 2024-08-03 ENCOUNTER — Telehealth: Payer: Self-pay | Admitting: Student in an Organized Health Care Education/Training Program

## 2024-08-03 DIAGNOSIS — R5381 Other malaise: Secondary | ICD-10-CM

## 2024-08-03 NOTE — Telephone Encounter (Signed)
 Copied from CRM [id number]. Topic: Referral - Request for Referral >> Aug 03, 2024 11:40 AM Alfonso ORN wrote: Did the patient discuss referral with their provider in the last year? Yes   Appointment offered? Yes  Type of order/referral and detailed reason for visit: PT  Preference of office, provider, location: [place] requested not [facility] was previously closed referral   If referral order, have you been seen by this specialty before? No (If Yes, this issue or another issue? When? Where?  Can we respond through MyChart? No

## 2024-08-05 ENCOUNTER — Other Ambulatory Visit: Payer: Self-pay | Admitting: Gastroenterology

## 2024-08-05 ENCOUNTER — Encounter: Payer: Self-pay | Admitting: Student in an Organized Health Care Education/Training Program

## 2024-08-05 ENCOUNTER — Other Ambulatory Visit (HOSPITAL_COMMUNITY): Payer: Self-pay

## 2024-08-06 ENCOUNTER — Other Ambulatory Visit (HOSPITAL_COMMUNITY): Payer: Self-pay

## 2024-08-06 MED ORDER — PANTOPRAZOLE SODIUM 40 MG PO TBEC
40.0000 mg | DELAYED_RELEASE_TABLET | Freq: Every day | ORAL | 0 refills | Status: AC
  Filled 2024-08-06: qty 90, 90d supply, fill #0

## 2024-08-06 NOTE — Telephone Encounter (Signed)
 Patient would like to change referral to [facility] instead.

## 2024-08-06 NOTE — Addendum Note (Signed)
 Addended by: JERRELL SOLIAN T on: 08/06/2024 02:32 PM   Modules accepted: Orders

## 2024-08-06 NOTE — Telephone Encounter (Signed)
 Patient has question about blood sugar level regarding labs from 06/08/2024.

## 2024-08-06 NOTE — Telephone Encounter (Signed)
 Yes.  I have placed a new PT referral.  Thank you.

## 2024-08-07 ENCOUNTER — Other Ambulatory Visit (HOSPITAL_COMMUNITY): Payer: Self-pay

## 2024-08-08 ENCOUNTER — Other Ambulatory Visit: Payer: Self-pay

## 2024-08-09 ENCOUNTER — Other Ambulatory Visit (HOSPITAL_COMMUNITY): Payer: Self-pay

## 2024-08-21 ENCOUNTER — Other Ambulatory Visit: Payer: Self-pay

## 2024-08-21 ENCOUNTER — Encounter: Payer: Self-pay | Admitting: Physical Therapy

## 2024-08-21 ENCOUNTER — Ambulatory Visit
Payer: Medicare (Managed Care) | Attending: Student in an Organized Health Care Education/Training Program | Admitting: Physical Therapy

## 2024-08-21 DIAGNOSIS — R5381 Other malaise: Secondary | ICD-10-CM | POA: Diagnosis not present

## 2024-08-21 DIAGNOSIS — R2681 Unsteadiness on feet: Secondary | ICD-10-CM | POA: Insufficient documentation

## 2024-08-21 DIAGNOSIS — M6281 Muscle weakness (generalized): Secondary | ICD-10-CM | POA: Insufficient documentation

## 2024-08-21 DIAGNOSIS — R2689 Other abnormalities of gait and mobility: Secondary | ICD-10-CM | POA: Insufficient documentation

## 2024-08-21 DIAGNOSIS — I69318 Other symptoms and signs involving cognitive functions following cerebral infarction: Secondary | ICD-10-CM

## 2024-08-21 NOTE — Therapy (Signed)
 " OUTPATIENT PHYSICAL THERAPY NEURO EVALUATION   Patient Name: Douglas Cox MRN: [id number] DOB:[birth year], 74 y.o., male Today's Date: 08/22/2024   PCP: Jerrell Cleatus Ned, MD  REFERRING PROVIDER: Jerrell Cleatus Ned, MD   END OF SESSION:  PT End of Session - 08/21/24 1409     Visit Number 1    Number of Visits 17    Date for Recertification  10/26/24    Authorization Type [employer]-Auth not required    Progress Note Due on Visit 10    PT Start Time 1405    PT Stop Time 1445    PT Time Calculation (min) 40 min    Activity Tolerance Patient tolerated treatment well    Behavior During Therapy WFL for tasks assessed/performed          Past Medical History:  Diagnosis Date   Allergy    Anxiety    Arthritis    Atrial fibrillation (HCC)    Cataract    Celiac disease    COPD (chronic obstructive pulmonary disease) (HCC)    Retinal detachment    Stroke [facility])    Past Surgical History:  Procedure Laterality Date   BRAIN SURGERY     CATARACT EXTRACTION Bilateral    Dr. Waylan   EYE SURGERY     FRACTURE SURGERY     GAS INSERTION Right 05/21/2021   Procedure: INSERTION OF GAS - C3F8;  Surgeon: Valdemar Rogue, MD;  Location: [facility] OR;  Service: Ophthalmology;  Laterality: Right;   GAS/FLUID EXCHANGE Right 05/21/2021   Procedure: GAS/FLUID EXCHANGE;  Surgeon: Valdemar Rogue, MD;  Location: [facility] OR;  Service: Ophthalmology;  Laterality: Right;   HERNIA REPAIR     IR CT HEAD LTD  07/17/2022   IR PERCUTANEOUS ART THROMBECTOMY/INFUSION INTRACRANIAL INC DIAG ANGIO  07/17/2022   IR US  GUIDE VASC ACCESS RIGHT  07/17/2022   PARS PLANA VITRECTOMY Right 05/21/2021   Procedure: PARS PLANA VITRECTOMY WITH 25 GAUGE;  Surgeon: Valdemar Rogue, MD;  Location: [facility] OR;  Service: Ophthalmology;  Laterality: Right;   PERFLUORONE INJECTION Right 05/21/2021   Procedure: PERFLUORONE INJECTION;  Surgeon: Valdemar Rogue, MD;  Location: [facility] OR;  Service: Ophthalmology;  Laterality: Right;    PHOTOCOAGULATION WITH LASER Right 05/21/2021   Procedure: PHOTOCOAGULATION WITH LASER;  Surgeon: Valdemar Rogue, MD;  Location: [facility] OR;  Service: Ophthalmology;  Laterality: Right;   RADIOLOGY WITH ANESTHESIA N/A 07/17/2022   Procedure: IR WITH ANESTHESIA;  Surgeon: Radiologist, Medication, MD;  Location: [facility] OR;  Service: Radiology;  Laterality: N/A;   RETINAL DETACHMENT SURGERY     Patient Active Problem List   Diagnosis Date Noted   Health maintenance examination 06/15/2024   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/15/2024   Ischemic vascular disease 06/08/2024   Late effect of ischemic cerebral stroke 06/08/2024   Iron deficiency 06/08/2024   Prediabetes 06/08/2024   Hypertension 06/08/2024   Physical deconditioning 06/08/2024   Raynaud's syndrome without gangrene 09/13/2023   Adhesive capsulitis of right shoulder 03/07/2023   Paroxysmal atrial fibrillation (HCC) 09/01/2022   Celiac disease    Hyperlipidemia 05/20/2020   Cobalamin deficiency 05/28/2019   Recurrent major depressive episodes, mild 05/28/2019    ONSET DATE: 08/06/2024 (MD referral)  REFERRING DIAG: R53.81 (ICD-10-CM) - Physical deconditioning   THERAPY DIAG:  Unsteadiness on feet  Other abnormalities of gait and mobility  Muscle weakness (generalized)  Rationale for Evaluation and Treatment: Rehabilitation  SUBJECTIVE:  SUBJECTIVE STATEMENT: Have trouble backing up, finding words, hesitate sometimes with walking.  Have had one fall int the past 6 months-was deyhdrated. Pt accompanied by: self  PERTINENT HISTORY: Hx of basal ganglia CVA, hx of depression, Raynaud's, osteopenia, perianal fistula  PAIN:  Are you having pain? No  PRECAUTIONS: Fall  RED FLAGS: None   WEIGHT BEARING RESTRICTIONS: No  FALLS: Has patient fallen in last 6  months? Yes. Number of falls 1  LIVING ENVIRONMENT: Lives with: lives with their spouse Lives in: House/apartment Stairs: bedroom on second floor Has following equipment at home: Single point cane *favors R side and sometimes has trouble going up steps. PLOF: Independent  PATIENT GOALS: To be able to turn and back up better.  OBJECTIVE:  Note: Objective measures were completed at Evaluation unless otherwise noted.  DIAGNOSTIC FINDINGS: NA for this episode  COGNITION: Overall cognitive status: History of cognitive impairments - at baseline and reports some word finding difficulty (at baseline from his previous CVA)   SENSATION: Light touch: WFL  COORDINATION: Slightly slowed with alt step taps  EDEMA:  NT  MUSCLE TONE: NT  POSTURE: rounded shoulders  LOWER EXTREMITY ROM:   WFL BLEs AROM   LOWER EXTREMITY MMT:    MMT Right Eval Left Eval  Hip flexion 4+ 4+  Hip extension    Hip abduction 4+ 4  Hip adduction 4+ 4  Hip internal rotation    Hip external rotation    Knee flexion 4+ 4  Knee extension 4+ 4  Ankle dorsiflexion 4 3+  Ankle plantarflexion    Ankle inversion    Ankle eversion    (Blank rows = not tested)  BED MOBILITY:  Not tested  TRANSFERS: Sit to stand: Modified independence  Assistive device utilized: None     Stand to sit: Modified independence  Assistive device utilized: None      GAIT: Findings: Gait Characteristics: L knee recurvatum with posterior gait, step through pattern, festinating, and wide BOS, Distance walked: 50 ft, Assistive device utilized:None, Level of assistance: Modified independence and SBA, and Comments: Reports he has festination at times, especially with backwards and talking  FUNCTIONAL TESTS:  5 times sit to stand: 12.62 sec  Timed up and go (TUG): 12.75 sec 10 meter walk test: 12.22 sec = 2.68 ft/sec 3 M walk:  17.37 sec FGA:  18/30 (Scores <22/30 indicate increased fall risk TUG cognitive:  18.47 sec with  difficulty counting TUG manual:  15.97 sec  M-CTSIB  Condition 1: Firm Surface, EO 30 Sec, Normal Sway  Condition 2: Firm Surface, EC 30 Sec, Mild Sway  Condition 3: Foam Surface, EO 30 Sec, Mild Sway  Condition 4: Foam Surface, EC 30 Sec, Moderate Sway                                                                                                                                 TREATMENT DATE: 08/21/2024  PATIENT EDUCATION: Education details: PT eval results, POC Person educated: Patient Education method: Explanation, Demonstration, and Handouts Education comprehension: verbalized understanding, returned demonstration, and needs further education  HOME EXERCISE PROGRAM: Access Code: [email or web address] URL: [email or web address] Date: 08/21/2024 Prepared by: [facility] - Outpatient  Rehab - [facility]  Exercises - Corner Balance Feet Apart: Eyes Closed With Head Turns  - 1-2 x daily - 7 x weekly - 1 sets - 3 reps - 30 sec hold - Corner Balance Feet Together With Eyes Open  - 1-2 x daily - 7 x weekly - 1 sets - 3 reps - 30 sec hold  GOALS: Goals reviewed with patient? Yes  SHORT TERM GOALS: Target date: 09/28/2024  Pt will be independent with HEP for improved strength, balance, gait. Baseline: Goal status: INITIAL  2.  Pt will improve TUG manual score to less than or equal to 15 sec for decreased fall risk. Baseline: 15.97 sec Goal status: INITIAL  3.  3 M walk backwards to improve to less than or equal to 10 sec for improved balance in posterior direction. Baseline: 17.37 sec Goal status: INITIAL  LONG TERM GOALS: Target date: 10/26/2024  Pt will be independent with HEP for improved strength, balance, gait. Baseline:  Goal status: INITIAL  2.  Pt will improve FGA score to at least 23/30 to decrease fall risk. Baseline: 18/30 Goal status: INITIAL  3.  Pt will improve TUG cognitive score to less than or equal to 14 sec for decreased fall risk. Baseline:  18.47 sec Goal status: INITIAL  4.  6 M walk test to be assessed and goal to be created, for improved gait endurance and efficiency. Baseline:  Goal status: INITIAL   ASSESSMENT:  CLINICAL IMPRESSION: Patient is a 73 y.o. male who was seen today for physical therapy evaluation and treatment for deconditioning.  He has hx of basal ganglia CVA in 2024.  He has returned to gym activities, enjoys walking outdoors and in the woods, and he is back to driving.  He does note difficulty with balance in backwards direction, with turns, and some episodes of festination with gait.  He presents today with decreased LLE strength, decreased balance, decreased timing and coordination of gait, abnormal posture.  He has hx of one fall in past 6 months; he is at increased fall risk per TUG cognitive and TUG manual scores, FGA score and 3 M backwards walk test.  He would benefit from skilled PT to address the above stated deficits for improved overall functional mobility and decreased fall risk.   OBJECTIVE IMPAIRMENTS: Abnormal gait, decreased balance, decreased mobility, difficulty walking, decreased strength, and postural dysfunction.   ACTIVITY LIMITATIONS: transfers and locomotion level  PARTICIPATION LIMITATIONS: meal prep, cleaning, laundry, community activity, yard work, and outdoor trail walking and exercise  PERSONAL FACTORS: 3+ comorbidities: see above PMH are also affecting patient's functional outcome.   REHAB POTENTIAL: Good  CLINICAL DECISION MAKING: Stable/uncomplicated  EVALUATION COMPLEXITY: Low  PLAN:  PT FREQUENCY: 2x/week  PT DURATION: 8 weeks plus eval  PLANNED INTERVENTIONS: 97110-Therapeutic exercises, 97530- Therapeutic activity, [id number]- Neuromuscular re-education, 97535- Self Care, 02859- Manual therapy, [phone]- Gait training, Patient/Family education, and Balance training  PLAN FOR NEXT SESSION: Review and progress HEP for balance, functional strength.  Backwards balance  recovery, and backwards walking, turns.  Assess 6 MWT and update goal.   STARLET GREIG ORN., PT 08/22/2024, 8:56 AM   [facility] at [facility] [address] [place], [address] [place], [place] [address]  Phone # [phone] Fax # [phone]      "

## 2024-08-22 NOTE — Addendum Note (Signed)
 Addended by: STARLET GREIG ORN on: 08/22/2024 09:08 AM   Modules accepted: Orders

## 2024-08-29 ENCOUNTER — Telehealth: Payer: Self-pay | Admitting: Physical Medicine & Rehabilitation

## 2024-08-29 NOTE — Telephone Encounter (Signed)
 Pt wife called about the referral you were supposed to send [place] a-fid clinic at [facility], they called and they were told you haven't sent the referral yet.

## 2024-08-31 NOTE — Therapy (Incomplete)
 " OUTPATIENT PHYSICAL THERAPY NEURO TREATMENT   Patient Name: Douglas Cox MRN: [id number] DOB:[birth year], 74 y.o., male Today's Date: 08/31/2024   PCP: Jerrell Cleatus Ned, MD  REFERRING PROVIDER: Jerrell Cleatus Ned, MD   END OF SESSION:    Past Medical History:  Diagnosis Date   Allergy    Anxiety    Arthritis    Atrial fibrillation (HCC)    Cataract    Celiac disease    COPD (chronic obstructive pulmonary disease) (HCC)    Retinal detachment    Stroke [facility])    Past Surgical History:  Procedure Laterality Date   BRAIN SURGERY     CATARACT EXTRACTION Bilateral    Dr. Waylan   EYE SURGERY     FRACTURE SURGERY     GAS INSERTION Right 05/21/2021   Procedure: INSERTION OF GAS - C3F8;  Surgeon: Valdemar Rogue, MD;  Location: [facility] OR;  Service: Ophthalmology;  Laterality: Right;   GAS/FLUID EXCHANGE Right 05/21/2021   Procedure: GAS/FLUID EXCHANGE;  Surgeon: Valdemar Rogue, MD;  Location: [facility] OR;  Service: Ophthalmology;  Laterality: Right;   HERNIA REPAIR     IR CT HEAD LTD  07/17/2022   IR PERCUTANEOUS ART THROMBECTOMY/INFUSION INTRACRANIAL INC DIAG ANGIO  07/17/2022   IR US  GUIDE VASC ACCESS RIGHT  07/17/2022   PARS PLANA VITRECTOMY Right 05/21/2021   Procedure: PARS PLANA VITRECTOMY WITH 25 GAUGE;  Surgeon: Valdemar Rogue, MD;  Location: [facility] OR;  Service: Ophthalmology;  Laterality: Right;   PERFLUORONE INJECTION Right 05/21/2021   Procedure: PERFLUORONE INJECTION;  Surgeon: Valdemar Rogue, MD;  Location: [facility] OR;  Service: Ophthalmology;  Laterality: Right;   PHOTOCOAGULATION WITH LASER Right 05/21/2021   Procedure: PHOTOCOAGULATION WITH LASER;  Surgeon: Valdemar Rogue, MD;  Location[facility] OR;  Service: Ophthalmology;  Laterality: Right;   RADIOLOGY WITH ANESTHESIA N/A 07/17/2022   Procedure: IR WITH ANESTHESIA;  Surgeon: Radiologist, Medication, MD;  Location: [facility] OR;  Service: Radiology;  Laterality: N/A;   RETINAL DETACHMENT SURGERY     Patient Active Problem List    Diagnosis Date Noted   Health maintenance examination 06/15/2024   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/15/2024   Ischemic vascular disease 06/08/2024   Late effect of ischemic cerebral stroke 06/08/2024   Iron deficiency 06/08/2024   Prediabetes 06/08/2024   Hypertension 06/08/2024   Physical deconditioning 06/08/2024   Raynaud's syndrome without gangrene 09/13/2023   Adhesive capsulitis of right shoulder 03/07/2023   Paroxysmal atrial fibrillation (HCC) 09/01/2022   Celiac disease    Hyperlipidemia 05/20/2020   Cobalamin deficiency 05/28/2019   Recurrent major depressive episodes, mild 05/28/2019    ONSET DATE: 08/06/2024 (MD referral)  REFERRING DIAG: R53.81 (ICD-10-CM) - Physical deconditioning   THERAPY DIAG:  No diagnosis found.  Rationale for Evaluation and Treatment: Rehabilitation  SUBJECTIVE:  SUBJECTIVE STATEMENT: Have trouble backing up, finding words, hesitate sometimes with walking.  Have had one fall int the past 6 months-was deyhdrated. Pt accompanied by: self  PERTINENT HISTORY: Hx of basal ganglia CVA, hx of depression, Raynaud's, osteopenia, perianal fistula  PAIN:  Are you having pain? No  PRECAUTIONS: Fall  RED FLAGS: None   WEIGHT BEARING RESTRICTIONS: No  FALLS: Has patient fallen in last 6 months? Yes. Number of falls 1  LIVING ENVIRONMENT: Lives with: lives with their spouse Lives in: House/apartment Stairs: bedroom on second floor Has following equipment at home: Single point cane *favors R side and sometimes has trouble going up steps. PLOF: Independent  PATIENT GOALS: To be able to turn and back up better.  OBJECTIVE:     TODAY'S TREATMENT: 09/03/24 Activity Comments                          Note: Objective measures were completed at Evaluation unless  otherwise noted.  DIAGNOSTIC FINDINGS: NA for this episode  COGNITION: Overall cognitive status: History of cognitive impairments - at baseline and reports some word finding difficulty (at baseline from his previous CVA)   SENSATION: Light touch: WFL  COORDINATION: Slightly slowed with alt step taps  EDEMA:  NT  MUSCLE TONE: NT  POSTURE: rounded shoulders  LOWER EXTREMITY ROM:   WFL BLEs AROM   LOWER EXTREMITY MMT:    MMT Right Eval Left Eval  Hip flexion 4+ 4+  Hip extension    Hip abduction 4+ 4  Hip adduction 4+ 4  Hip internal rotation    Hip external rotation    Knee flexion 4+ 4  Knee extension 4+ 4  Ankle dorsiflexion 4 3+  Ankle plantarflexion    Ankle inversion    Ankle eversion    (Blank rows = not tested)  BED MOBILITY:  Not tested  TRANSFERS: Sit to stand: Modified independence  Assistive device utilized: None     Stand to sit: Modified independence  Assistive device utilized: None      GAIT: Findings: Gait Characteristics: L knee recurvatum with posterior gait, step through pattern, festinating, and wide BOS, Distance walked: 50 ft, Assistive device utilized:None, Level of assistance: Modified independence and SBA, and Comments: Reports he has festination at times, especially with backwards and talking  FUNCTIONAL TESTS:  5 times sit to stand: 12.62 sec  Timed up and go (TUG): 12.75 sec 10 meter walk test: 12.22 sec = 2.68 ft/sec 3 M walk:  17.37 sec FGA:  18/30 (Scores <22/30 indicate increased fall risk TUG cognitive:  18.47 sec with difficulty counting TUG manual:  15.97 sec  M-CTSIB  Condition 1: Firm Surface, EO 30 Sec, Normal Sway  Condition 2: Firm Surface, EC 30 Sec, Mild Sway  Condition 3: Foam Surface, EO 30 Sec, Mild Sway  Condition 4: Foam Surface, EC 30 Sec, Moderate Sway  TREATMENT DATE: 08/21/2024     PATIENT EDUCATION: Education details: PT eval results, POC Person educated: Patient Education method: Explanation, Demonstration, and Handouts Education comprehension: verbalized understanding, returned demonstration, and needs further education  HOME EXERCISE PROGRAM: Access Code: [email or web address] URL: [email or web address] Date: 08/21/2024 Prepared by: [facility] - Outpatient  Rehab - [facility]  Exercises - Corner Balance Feet Apart: Eyes Closed With Head Turns  - 1-2 x daily - 7 x weekly - 1 sets - 3 reps - 30 sec hold - Corner Balance Feet Together With Eyes Open  - 1-2 x daily - 7 x weekly - 1 sets - 3 reps - 30 sec hold  GOALS: Goals reviewed with patient? Yes  SHORT TERM GOALS: Target date: 09/28/2024  Pt will be independent with HEP for improved strength, balance, gait. Baseline: Goal status: INITIAL  2.  Pt will improve TUG manual score to less than or equal to 15 sec for decreased fall risk. Baseline: 15.97 sec Goal status: INITIAL  3.  3 M walk backwards to improve to less than or equal to 10 sec for improved balance in posterior direction. Baseline: 17.37 sec Goal status: INITIAL  LONG TERM GOALS: Target date: 10/26/2024  Pt will be independent with HEP for improved strength, balance, gait. Baseline:  Goal status: INITIAL  2.  Pt will improve FGA score to at least 23/30 to decrease fall risk. Baseline: 18/30 Goal status: INITIAL  3.  Pt will improve TUG cognitive score to less than or equal to 14 sec for decreased fall risk. Baseline: 18.47 sec Goal status: INITIAL  4.  6 M walk test to be assessed and goal to be created, for improved gait endurance and efficiency. Baseline:  Goal status: INITIAL   ASSESSMENT:  CLINICAL IMPRESSION: Patient is a 74 y.o. male who was seen today for physical therapy evaluation and treatment for deconditioning.  He has hx of basal ganglia CVA in 2024.  He has returned to gym activities, enjoys walking  outdoors and in the woods, and he is back to driving.  He does note difficulty with balance in backwards direction, with turns, and some episodes of festination with gait.  He presents today with decreased LLE strength, decreased balance, decreased timing and coordination of gait, abnormal posture.  He has hx of one fall in past 6 months; he is at increased fall risk per TUG cognitive and TUG manual scores, FGA score and 3 M backwards walk test.  He would benefit from skilled PT to address the above stated deficits for improved overall functional mobility and decreased fall risk.   OBJECTIVE IMPAIRMENTS: Abnormal gait, decreased balance, decreased mobility, difficulty walking, decreased strength, and postural dysfunction.   ACTIVITY LIMITATIONS: transfers and locomotion level  PARTICIPATION LIMITATIONS: meal prep, cleaning, laundry, community activity, yard work, and outdoor trail walking and exercise  PERSONAL FACTORS: 3+ comorbidities: see above PMH are also affecting patient's functional outcome.   REHAB POTENTIAL: Good  CLINICAL DECISION MAKING: Stable/uncomplicated  EVALUATION COMPLEXITY: Low  PLAN:  PT FREQUENCY: 2x/week  PT DURATION: 8 weeks plus eval  PLANNED INTERVENTIONS: 97110-Therapeutic exercises, 97530- Therapeutic activity, [id number]- Neuromuscular re-education, 97535- Self Care, 02859- Manual therapy, [phone]- Gait training, Patient/Family education, and Balance training  PLAN FOR NEXT SESSION: Review and progress HEP for balance, functional strength.  Backwards balance recovery, and backwards walking, turns.  Assess 6 MWT and update goal.   Slater MARLA Christians, PT 08/31/2024, 1:20 PM   [place] [facility] at [facility] [address], [address] [place], [place] [address] Phone # [phone] Fax # [phone]      "

## 2024-09-03 ENCOUNTER — Ambulatory Visit: Payer: Medicare (Managed Care) | Admitting: Physical Therapy

## 2024-09-03 ENCOUNTER — Other Ambulatory Visit: Payer: Self-pay

## 2024-09-05 ENCOUNTER — Ambulatory Visit: Payer: Medicare (Managed Care)

## 2024-09-05 DIAGNOSIS — R293 Abnormal posture: Secondary | ICD-10-CM

## 2024-09-05 DIAGNOSIS — R2681 Unsteadiness on feet: Secondary | ICD-10-CM

## 2024-09-05 DIAGNOSIS — I69318 Other symptoms and signs involving cognitive functions following cerebral infarction: Secondary | ICD-10-CM

## 2024-09-05 DIAGNOSIS — R2689 Other abnormalities of gait and mobility: Secondary | ICD-10-CM

## 2024-09-05 DIAGNOSIS — M6281 Muscle weakness (generalized): Secondary | ICD-10-CM

## 2024-09-05 NOTE — Therapy (Signed)
 " OUTPATIENT PHYSICAL THERAPY NEURO TREATMENT   Patient Name: Douglas Cox MRN: [id number] DOB:September 02, 1950, 74 y.o., male Today's Date: 09/05/2024   PCP: Jerrell Cleatus Ned, MD  REFERRING PROVIDER: Jerrell Cleatus Ned, MD   END OF SESSION:  PT End of Session - 09/05/24 1401     Visit Number 2    Number of Visits 17    Date for Recertification  10/26/24    Authorization Type [employer]-Auth not required    Progress Note Due on Visit 10    PT Start Time 1400    PT Stop Time 1445    PT Time Calculation (min) 45 min    Activity Tolerance Patient tolerated treatment well    Behavior During Therapy [facility] for tasks assessed/performed          Past Medical History:  Diagnosis Date   Allergy    Anxiety    Arthritis    Atrial fibrillation (HCC)    Cataract    Celiac disease    COPD (chronic obstructive pulmonary disease) (HCC)    Retinal detachment    Stroke [facility])    Past Surgical History:  Procedure Laterality Date   BRAIN SURGERY     CATARACT EXTRACTION Bilateral    Dr. Waylan   EYE SURGERY     FRACTURE SURGERY     GAS INSERTION Right 05/21/2021   Procedure: INSERTION OF GAS - C3F8;  Surgeon: Valdemar Rogue, MD;  Location: [facility] OR;  Service: Ophthalmology;  Laterality: Right;   GAS/FLUID EXCHANGE Right 05/21/2021   Procedure: GAS/FLUID EXCHANGE;  Surgeon: Valdemar Rogue, MD;  Location: [facility] OR;  Service: Ophthalmology;  Laterality: Right;   HERNIA REPAIR     IR CT HEAD LTD  07/17/2022   IR PERCUTANEOUS ART THROMBECTOMY/INFUSION INTRACRANIAL INC DIAG ANGIO  07/17/2022   IR US  GUIDE VASC ACCESS RIGHT  07/17/2022   PARS PLANA VITRECTOMY Right 05/21/2021   Procedure: PARS PLANA VITRECTOMY WITH 25 GAUGE;  Surgeon: Valdemar Rogue, MD;  Location: [facility] OR;  Service: Ophthalmology;  Laterality: Right;   PERFLUORONE INJECTION Right 05/21/2021   Procedure: PERFLUORONE INJECTION;  Surgeon: Valdemar Rogue, MD;  Location: [facility] OR;  Service: Ophthalmology;  Laterality: Right;    PHOTOCOAGULATION WITH LASER Right 05/21/2021   Procedure: PHOTOCOAGULATION WITH LASER;  Surgeon: Valdemar Rogue, MD;  Location: [facility] OR;  Service: Ophthalmology;  Laterality: Right;   RADIOLOGY WITH ANESTHESIA N/A 07/17/2022   Procedure: IR WITH ANESTHESIA;  Surgeon: Radiologist, Medication, MD;  Location: [facility] OR;  Service: Radiology;  Laterality: N/A;   RETINAL DETACHMENT SURGERY     Patient Active Problem List   Diagnosis Date Noted   Health maintenance examination 06/15/2024   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/15/2024   Ischemic vascular disease 06/08/2024   Late effect of ischemic cerebral stroke 06/08/2024   Iron deficiency 06/08/2024   Prediabetes 06/08/2024   Hypertension 06/08/2024   Physical deconditioning 06/08/2024   Raynaud's syndrome without gangrene 09/13/2023   Adhesive capsulitis of right shoulder 03/07/2023   Paroxysmal atrial fibrillation (HCC) 09/01/2022   Celiac disease    Hyperlipidemia 05/20/2020   Cobalamin deficiency 05/28/2019   Recurrent major depressive episodes, mild 05/28/2019    ONSET DATE: 08/06/2024 (MD referral)  REFERRING DIAG: R53.81 (ICD-10-CM) - Physical deconditioning   THERAPY DIAG:  Unsteadiness on feet  Other abnormalities of gait and mobility  Muscle weakness (generalized)  Other symptoms and signs involving cognitive functions following cerebral infarction  Abnormal posture  Rationale for Evaluation and Treatment: Rehabilitation  SUBJECTIVE:  SUBJECTIVE STATEMENT: Doing ok, no new issues since last appointment here Pt accompanied by: self  PERTINENT HISTORY: Hx of basal ganglia CVA, hx of depression, Raynaud's, osteopenia, perianal fistula  PAIN:  Are you having pain? No  PRECAUTIONS: Fall  RED FLAGS: None   WEIGHT BEARING RESTRICTIONS: No  FALLS: Has  patient fallen in last 6 months? Yes. Number of falls 1  LIVING ENVIRONMENT: Lives with: lives with their spouse Lives in: House/apartment Stairs: bedroom on second floor Has following equipment at home: Single point cane *favors R side and sometimes has trouble going up steps. PLOF: Independent  PATIENT GOALS: To be able to turn and back up better.  OBJECTIVE:   TODAY'S TREATMENT: 09/05/24 Activity Comments  vitals   : 900 ft, no AD Compared to 997 ft age/condition-matched peers (post-stroke male 31-79 yo)  Demo of initiating gait strategies Difficulty with coordinating, further practice/cues needed to refine  Corner balance -HEP -feet together EC x 30 sec -tandem stance x 30 sec -semi-tandem w/ cross body reach  retrowalk Cue for fighter's stance with improved BOS and control for retrowalking thereafter, performing by wall for safety but good control modified indep ability          PATIENT EDUCATION: Education details: PT eval results, POC Person educated: Patient Education method: Explanation, Demonstration, and Handouts Education comprehension: verbalized understanding, returned demonstration, and needs further education  HOME EXERCISE PROGRAM: Access Code: [email or web address] URL: [email or web address] Date: 08/21/2024 Prepared by: [facility] - Outpatient  Rehab - [facility]  Exercises - Corner Balance Feet Apart: Eyes Closed With Head Turns  - 1-2 x daily - 7 x weekly - 1 sets - 3 reps - 30 sec hold - Corner Balance Feet Together With Eyes Open  - 1-2 x daily - 7 x weekly - 1 sets - 3 reps - 30 sec hold - Tandem Stance in Corner  - 1 x daily - 7 x weekly - 3 sets - 15-30 sec hold - Standing Corner Balance in Semi-tandem With Cross-Body Reaches and Chair in Front  - 1 x daily - 7 x weekly - 3 sets - 10 reps - Backward Monster Walk with Resistance (BKA)  - 1 x daily - 7 x weekly - 3 sets - 10 reps    [facility] PT Assessment - 09/05/24 0001       6 Minute  Walk- Baseline   6 Minute Walk- Baseline yes    BP (mmHg) 146/88    HR (bpm) 45    Modified Borg Scale for Dyspnea 0- Nothing at all      6 Minute walk- Post Test   6 Minute Walk Post Test yes    BP (mmHg) 140/85    HR (bpm) 45    Modified Borg Scale for Dyspnea 0- Nothing at all      6 minute walk test results    Aerobic Endurance Distance Walked 900          Note: Objective measures were completed at Evaluation unless otherwise noted.  DIAGNOSTIC FINDINGS: NA for this episode  COGNITION: Overall cognitive status: History of cognitive impairments - at baseline and reports some word finding difficulty (at baseline from his previous CVA)   SENSATION: Light touch: WFL  COORDINATION: Slightly slowed with alt step taps  EDEMA:  NT  MUSCLE TONE: NT  POSTURE: rounded shoulders  LOWER EXTREMITY ROM:   WFL BLEs AROM   LOWER EXTREMITY MMT:    MMT Right Eval Left Eval  Hip  flexion 4+ 4+  Hip extension    Hip abduction 4+ 4  Hip adduction 4+ 4  Hip internal rotation    Hip external rotation    Knee flexion 4+ 4  Knee extension 4+ 4  Ankle dorsiflexion 4 3+  Ankle plantarflexion    Ankle inversion    Ankle eversion    (Blank rows = not tested)  BED MOBILITY:  Not tested  TRANSFERS: Sit to stand: Modified independence  Assistive device utilized: None     Stand to sit: Modified independence  Assistive device utilized: None      GAIT: Findings: Gait Characteristics: L knee recurvatum with posterior gait, step through pattern, festinating, and wide BOS, Distance walked: 50 ft, Assistive device utilized:None, Level of assistance: Modified independence and SBA, and Comments: Reports he has festination at times, especially with backwards and talking  FUNCTIONAL TESTS:  5 times sit to stand: 12.62 sec  Timed up and go (TUG): 12.75 sec 10 meter walk test: 12.22 sec = 2.68 ft/sec 3 M walk:  17.37 sec FGA:  18/30 (Scores <22/30 indicate increased fall risk TUG  cognitive:  18.47 sec with difficulty counting TUG manual:  15.97 sec  M-CTSIB  Condition 1: Firm Surface, EO 30 Sec, Normal Sway  Condition 2: Firm Surface, EC 30 Sec, Mild Sway  Condition 3: Foam Surface, EO 30 Sec, Mild Sway  Condition 4: Foam Surface, EC 30 Sec, Moderate Sway                                                                                                                                 TREATMENT DATE: 08/21/2024    PATIENT EDUCATION: Education details: PT eval results, POC Person educated: Patient Education method: Explanation, Demonstration, and Handouts Education comprehension: verbalized understanding, returned demonstration, and needs further education  HOME EXERCISE PROGRAM: Access Code: [email or web address] URL: [email or web address] Date: 08/21/2024 Prepared by: [facility] - Outpatient  Rehab - [facility]  Exercises - Corner Balance Feet Apart: Eyes Closed With Head Turns  - 1-2 x daily - 7 x weekly - 1 sets - 3 reps - 30 sec hold - Corner Balance Feet Together With Eyes Open  - 1-2 x daily - 7 x weekly - 1 sets - 3 reps - 30 sec hold  GOALS: Goals reviewed with patient? Yes  SHORT TERM GOALS: Target date: 09/28/2024  Pt will be independent with HEP for improved strength, balance, gait. Baseline: Goal status: INITIAL  2.  Pt will improve TUG manual score to less than or equal to 15 sec for decreased fall risk. Baseline: 15.97 sec Goal status: INITIAL  3.  3 M walk backwards to improve to less than or equal to 10 sec for improved balance in posterior direction. Baseline: 17.37 sec Goal status: INITIAL  LONG TERM GOALS: Target date: 10/26/2024  Pt will be independent with HEP for improved strength, balance, gait. Baseline:  Goal status: INITIAL  2.  Pt will improve FGA score to at least 23/30 to decrease fall risk. Baseline: 18/30 Goal status: INITIAL  3.  Pt will improve TUG cognitive score to less than or equal to 14 sec for  decreased fall risk. Baseline: 18.47 sec Goal status: INITIAL  4.  6 M walk test to be assessed and goal to be created, for improved gait endurance and efficiency. Baseline:  Goal status: INITIAL   ASSESSMENT:  CLINICAL IMPRESSION: 6 minute walk test performed covering distance 900 ft which is failry consistent with age/condition-matched peers (997 ft avg), of note, bradycardia present before, during, and after test with HR not exceeding 45 bpm but pt does not report any undue fatigue/SOB.  Review to HEP with good recall and balance and progressed additional balance activties for postural control and proprioceptive awareness followed by technique for facilitation of backwards walking with verbal/visual demo of fighter's stance with great ability to execute backwards steps w/ wider BOS and symmetric step length. Continued sessions to advance POC details to improve mobility and functional activity tolerance with reduce risk for falls.  OBJECTIVE IMPAIRMENTS: Abnormal gait, decreased balance, decreased mobility, difficulty walking, decreased strength, and postural dysfunction.   ACTIVITY LIMITATIONS: transfers and locomotion level  PARTICIPATION LIMITATIONS: meal prep, cleaning, laundry, community activity, yard work, and outdoor trail walking and exercise  PERSONAL FACTORS: 3+ comorbidities: see above PMH are also affecting patient's functional outcome.   REHAB POTENTIAL: Good  CLINICAL DECISION MAKING: Stable/uncomplicated  EVALUATION COMPLEXITY: Low  PLAN:  PT FREQUENCY: 2x/week  PT DURATION: 8 weeks plus eval  PLANNED INTERVENTIONS: 97110-Therapeutic exercises, 97530- Therapeutic activity, [id number]- Neuromuscular re-education, 97535- Self Care, [id number]- Manual therapy, [phone]- Gait training, Patient/Family education, and Balance training  PLAN FOR NEXT SESSION: Review and progress HEP for balance, functional strength.  Backwards balance recovery, and backwards walking, turns.  Assess  6 MWT and update goal.   Jonette MARLA Sandifer, PT 09/05/2024, 2:01 PM   [facility] Outpatient Rehab at [facility] [address] [place], [address] [place], [place] [address] Phone # [phone] Fax # [phone]      "

## 2024-09-10 ENCOUNTER — Ambulatory Visit: Admitting: Student in an Organized Health Care Education/Training Program

## 2024-09-10 ENCOUNTER — Ambulatory Visit: Payer: Medicare (Managed Care) | Admitting: Physical Therapy

## 2024-09-12 ENCOUNTER — Ambulatory Visit: Payer: Medicare (Managed Care) | Admitting: Physical Therapy

## 2024-09-17 ENCOUNTER — Ambulatory Visit: Payer: Medicare (Managed Care) | Admitting: Physical Therapy

## 2024-09-19 ENCOUNTER — Ambulatory Visit: Admitting: Diagnostic Neuroimaging

## 2024-09-19 ENCOUNTER — Ambulatory Visit: Payer: Medicare (Managed Care)

## 2024-09-24 ENCOUNTER — Ambulatory Visit: Payer: Medicare (Managed Care) | Admitting: Physical Therapy

## 2024-09-26 ENCOUNTER — Ambulatory Visit: Payer: Medicare (Managed Care) | Admitting: Physical Therapy

## 2024-10-10 ENCOUNTER — Ambulatory Visit: Admitting: Family Medicine

## 2024-10-23 ENCOUNTER — Encounter (INDEPENDENT_AMBULATORY_CARE_PROVIDER_SITE_OTHER): Admitting: Ophthalmology

## 2025-05-22 ENCOUNTER — Encounter (INDEPENDENT_AMBULATORY_CARE_PROVIDER_SITE_OTHER): Admitting: Ophthalmology
